# Patient Record
Sex: Male | Born: 1945
Health system: Southern US, Community
[De-identification: ages and names within clinical notes are randomized; demographics above are authoritative.]

## PROBLEM LIST (undated history)

## (undated) DIAGNOSIS — K649 Unspecified hemorrhoids: Secondary | ICD-10-CM

## (undated) DIAGNOSIS — E119 Type 2 diabetes mellitus without complications: Secondary | ICD-10-CM

## (undated) DIAGNOSIS — R413 Other amnesia: Secondary | ICD-10-CM

## (undated) DIAGNOSIS — S42401A Unspecified fracture of lower end of right humerus, initial encounter for closed fracture: Secondary | ICD-10-CM

## (undated) DIAGNOSIS — I10 Essential (primary) hypertension: Secondary | ICD-10-CM

## (undated) DIAGNOSIS — I779 Disorder of arteries and arterioles, unspecified: Secondary | ICD-10-CM

## (undated) DIAGNOSIS — Z86010 Personal history of colonic polyps: Secondary | ICD-10-CM

## (undated) DIAGNOSIS — E785 Hyperlipidemia, unspecified: Secondary | ICD-10-CM

## (undated) DIAGNOSIS — M1990 Unspecified osteoarthritis, unspecified site: Secondary | ICD-10-CM

## (undated) DIAGNOSIS — I739 Peripheral vascular disease, unspecified: Secondary | ICD-10-CM

## (undated) DIAGNOSIS — K5790 Diverticulosis of intestine, part unspecified, without perforation or abscess without bleeding: Secondary | ICD-10-CM

## (undated) HISTORY — DX: Hyperlipidemia, unspecified: E78.5

## (undated) HISTORY — DX: Peripheral vascular disease, unspecified: I73.9

## (undated) HISTORY — DX: Other amnesia: R41.3

## (undated) HISTORY — DX: Disorder of arteries and arterioles, unspecified: I77.9

## (undated) HISTORY — DX: Personal history of colonic polyps: Z86.010

## (undated) HISTORY — DX: Type 2 diabetes mellitus without complications: E11.9

## (undated) HISTORY — DX: Essential (primary) hypertension: I10

## (undated) HISTORY — DX: Unspecified fracture of lower end of right humerus, initial encounter for closed fracture: S42.401A

---

## 2016-08-11 ENCOUNTER — Encounter: Payer: Self-pay | Admitting: Sports Medicine

## 2016-08-11 ENCOUNTER — Ambulatory Visit (INDEPENDENT_AMBULATORY_CARE_PROVIDER_SITE_OTHER): Payer: Medicare HMO | Admitting: Sports Medicine

## 2016-08-11 ENCOUNTER — Ambulatory Visit: Payer: Self-pay

## 2016-08-11 ENCOUNTER — Ambulatory Visit (INDEPENDENT_AMBULATORY_CARE_PROVIDER_SITE_OTHER): Payer: Medicare HMO

## 2016-08-11 ENCOUNTER — Encounter: Payer: Self-pay | Admitting: Physician Assistant

## 2016-08-11 ENCOUNTER — Ambulatory Visit (INDEPENDENT_AMBULATORY_CARE_PROVIDER_SITE_OTHER): Payer: Medicare HMO | Admitting: Physician Assistant

## 2016-08-11 VITALS — BP 190/110 | HR 73 | Ht 71.0 in | Wt 200.0 lb

## 2016-08-11 VITALS — BP 140/83 | HR 83 | Temp 98.0°F | Resp 16 | Ht 71.0 in | Wt 200.0 lb

## 2016-08-11 DIAGNOSIS — M7021 Olecranon bursitis, right elbow: Secondary | ICD-10-CM

## 2016-08-11 DIAGNOSIS — R2231 Localized swelling, mass and lump, right upper limb: Secondary | ICD-10-CM | POA: Diagnosis not present

## 2016-08-11 DIAGNOSIS — I10 Essential (primary) hypertension: Secondary | ICD-10-CM | POA: Diagnosis not present

## 2016-08-11 DIAGNOSIS — M25421 Effusion, right elbow: Secondary | ICD-10-CM | POA: Diagnosis not present

## 2016-08-11 DIAGNOSIS — M7989 Other specified soft tissue disorders: Secondary | ICD-10-CM | POA: Diagnosis not present

## 2016-08-11 DIAGNOSIS — Z720 Tobacco use: Secondary | ICD-10-CM | POA: Diagnosis not present

## 2016-08-11 DIAGNOSIS — M25422 Effusion, left elbow: Secondary | ICD-10-CM

## 2016-08-11 MED ORDER — NICOTINE POLACRILEX 2 MG MT LOZG: 2.0000 mg | LOZENGE | OROMUCOSAL | 0 refills | Status: DC | PRN

## 2016-08-11 NOTE — Progress Notes (Signed)
Patient presents to clinic today to establish care.  Acute Concerns: Swelling of R elbow x 1 year that lasted several months, before resolving. Noted recurrence of swelling. Denies pain to the area but noted erythema and warmth. Notes improvement in redness and warmth but swelling is still present. Has a history of R elbow fracture x 30 years prior but denies any recent trauma or injury. Denies fever, chills, malaise/fatigue.    Chronic Issues: Tobacco Use Disorder -- 50 pack-year smoking history. Has previously successfully quit with nicotine lozenges. Is ready to attempt again. Denies history of COPD or asthma. Denies chronic cough, hemoptysis, wheezing or SOB.  Hypertension -- Patient notes receiving this diagnosis many years prior. Denies treatment with medication. Endorses poor diet and lack of exercise. Patient denies chest pain, palpitations, lightheadedness, dizziness, vision changes or frequent headaches.  Health Maintenance: Immunizations -- Unsure of last tetanus. Has never had pneumonia vaccination.  Colonoscopy -- Has never had. Denies melena, hematochezia, tenesmus, change to bowels. Denies family history of colon cancer screening.   Past Medical History:  Diagnosis Date  . Closed fracture dislocation of right elbow    30 years ago -- fell out of theback of a truck  . Hypertension    Diagnosed 10 years ago. No on medication. Denies history of CAD.     Past Surgical History:  Procedure Laterality Date  . NO PAST SURGERIES      No current outpatient prescriptions on file prior to visit.   No current facility-administered medications on file prior to visit.    No Known Allergies  Family History  Problem Relation Age of Onset  . Hypertension Mother   . Dementia Mother        2s  . Transient ischemic attack Mother        59s  . Kidney disease Father   . COPD Father   . Hypertension Brother   . Alcohol abuse Maternal Grandfather   . Mental illness Paternal  Grandmother   . Hypertension Daughter   . Healthy Son   . Learning disabilities Son   . Lung cancer Maternal Aunt     Social History   Social History  . Marital status: Married    Spouse name: N/A  . Number of children: 3  . Years of education: N/A   Occupational History  . Retired     Dealer   Social History Main Topics  . Smoking status: Current Every Day Smoker    Packs/day: 1.00    Years: 50.00    Types: Cigarettes    Start date: 08/12/1966  . Smokeless tobacco: Never Used  . Alcohol use No  . Drug use: No  . Sexual activity: Yes   Other Topics Concern  . Not on file   Social History Narrative  . No narrative on file   Review of Systems  Constitutional: Negative for fever and weight loss.  HENT: Negative for ear discharge, ear pain, hearing loss and tinnitus.   Eyes: Negative for blurred vision, double vision, photophobia and pain.  Respiratory: Negative for cough and shortness of breath.   Cardiovascular: Negative for chest pain and palpitations.  Gastrointestinal: Negative for abdominal pain, blood in stool, constipation, diarrhea, heartburn, melena, nausea and vomiting.  Genitourinary: Negative for dysuria, flank pain, frequency, hematuria and urgency.  Musculoskeletal: Negative for falls.       R elbow swelling  Neurological: Negative for dizziness, loss of consciousness and headaches.  Endo/Heme/Allergies: Negative for environmental allergies.  Psychiatric/Behavioral: Negative for depression, hallucinations, substance abuse and suicidal ideas. The patient is not nervous/anxious and does not have insomnia.    BP 140/83   Pulse 83   Temp 98 F (36.7 C) (Oral)   Resp 16   Ht 5\' 11"  (1.803 m)   Wt 200 lb (90.7 kg)   BMI 27.89 kg/m   Physical Exam  Constitutional: He is oriented to person, place, and time and well-developed, well-nourished, and in no distress.  HENT:  Head: Normocephalic and atraumatic.  Right Ear: External ear normal.  Left Ear:  External ear normal.  Nose: Nose normal.  Mouth/Throat: Oropharynx is clear and moist. No oropharyngeal exudate.  TM within normal limits bilaterally.  Eyes: Conjunctivae are normal.  Neck: Neck supple.  Cardiovascular: Normal rate, regular rhythm, normal heart sounds and intact distal pulses.   Pulmonary/Chest: Effort normal and breath sounds normal. No respiratory distress. He has no wheezes. He has no rales. He exhibits no tenderness.  Musculoskeletal:       Right elbow: He exhibits swelling. He exhibits normal range of motion, no effusion, no deformity and no laceration. No tenderness found.  Neurological: He is alert and oriented to person, place, and time.  Skin: Skin is warm and dry. No rash noted.  Psychiatric: Affect normal.  Vitals reviewed.  Assessment/Plan: Tobacco abuse disorder Patient is ready to quit. Will start nicotine lozenges. Close follow-up scheduled for CPE. Will discuss lung cancer screening at that visit.   Olecranon bursitis of right elbow Does not seem infectious. Present for 2 weeks and non-improving despite supportive measures. ROM within normal limits. No pain at present. Referral to Sports medicine placed for aspiration versus expectant management. Appt scheduled for this afternoon.  Essential hypertension Start DASH diet. Will work on smoking cessation. CPE scheduled for 2 weeks. Will repeat BP at that time. If still elevated will start medication with continued TLC.     Leeanne Rio, PA-C

## 2016-08-11 NOTE — Assessment & Plan Note (Signed)
Start DASH diet. Will work on smoking cessation. CPE scheduled for 2 weeks. Will repeat BP at that time. If still elevated will start medication with continued TLC.

## 2016-08-11 NOTE — Patient Instructions (Addendum)
You will be contacted for assessment and management by Dr. Paulla Fore. Please call me if you do not hear from his office in the next couple of days for an appointment. Keep the area iced and elevated.   Please follow the diet below. Start the OTC nicotine losenges to help with smoking cessation. Follow-up with me in 2 weeks for a complete physical. We will reassess blood pressure and update all health maintenance topics.   DASH Eating Plan DASH stands for "Dietary Approaches to Stop Hypertension." The DASH eating plan is a healthy eating plan that has been shown to reduce high blood pressure (hypertension). It may also reduce your risk for type 2 diabetes, heart disease, and stroke. The DASH eating plan may also help with weight loss. What are tips for following this plan? General guidelines  Avoid eating more than 2,300 mg (milligrams) of salt (sodium) a day. If you have hypertension, you may need to reduce your sodium intake to 1,500 mg a day.  Limit alcohol intake to no more than 1 drink a day for nonpregnant women and 2 drinks a day for men. One drink equals 12 oz of beer, 5 oz of wine, or 1 oz of hard liquor.  Work with your health care provider to maintain a healthy body weight or to lose weight. Ask what an ideal weight is for you.  Get at least 30 minutes of exercise that causes your heart to beat faster (aerobic exercise) most days of the week. Activities may include walking, swimming, or biking.  Work with your health care provider or diet and nutrition specialist (dietitian) to adjust your eating plan to your individual calorie needs. Reading food labels  Check food labels for the amount of sodium per serving. Choose foods with less than 5 percent of the Daily Value of sodium. Generally, foods with less than 300 mg of sodium per serving fit into this eating plan.  To find whole grains, look for the word "whole" as the first word in the ingredient list. Shopping  Buy products  labeled as "low-sodium" or "no salt added."  Buy fresh foods. Avoid canned foods and premade or frozen meals. Cooking  Avoid adding salt when cooking. Use salt-free seasonings or herbs instead of table salt or sea salt. Check with your health care provider or pharmacist before using salt substitutes.  Do not fry foods. Cook foods using healthy methods such as baking, boiling, grilling, and broiling instead.  Cook with heart-healthy oils, such as olive, canola, soybean, or sunflower oil. Meal planning   Eat a balanced diet that includes: ? 5 or more servings of fruits and vegetables each day. At each meal, try to fill half of your plate with fruits and vegetables. ? Up to 6-8 servings of whole grains each day. ? Less than 6 oz of lean meat, poultry, or fish each day. A 3-oz serving of meat is about the same size as a deck of cards. One egg equals 1 oz. ? 2 servings of low-fat dairy each day. ? A serving of nuts, seeds, or beans 5 times each week. ? Heart-healthy fats. Healthy fats called Omega-3 fatty acids are found in foods such as flaxseeds and coldwater fish, like sardines, salmon, and mackerel.  Limit how much you eat of the following: ? Canned or prepackaged foods. ? Food that is high in trans fat, such as fried foods. ? Food that is high in saturated fat, such as fatty meat. ? Sweets, desserts, sugary drinks, and other  foods with added sugar. ? Full-fat dairy products.  Do not salt foods before eating.  Try to eat at least 2 vegetarian meals each week.  Eat more home-cooked food and less restaurant, buffet, and fast food.  When eating at a restaurant, ask that your food be prepared with less salt or no salt, if possible. What foods are recommended? The items listed may not be a complete list. Talk with your dietitian about what dietary choices are best for you. Grains Whole-grain or whole-wheat bread. Whole-grain or whole-wheat pasta. Brown rice. Modena Morrow. Bulgur.  Whole-grain and low-sodium cereals. Pita bread. Low-fat, low-sodium crackers. Whole-wheat flour tortillas. Vegetables Fresh or frozen vegetables (raw, steamed, roasted, or grilled). Low-sodium or reduced-sodium tomato and vegetable juice. Low-sodium or reduced-sodium tomato sauce and tomato paste. Low-sodium or reduced-sodium canned vegetables. Fruits All fresh, dried, or frozen fruit. Canned fruit in natural juice (without added sugar). Meat and other protein foods Skinless chicken or [place]. Ground chicken or [place]. Pork with fat trimmed off. Fish and seafood. Egg whites. Dried beans, peas, or lentils. Unsalted nuts, nut butters, and seeds. Unsalted canned beans. Lean cuts of beef with fat trimmed off. Low-sodium, lean deli meat. Dairy Low-fat (1%) or fat-free (skim) milk. Fat-free, low-fat, or reduced-fat cheeses. Nonfat, low-sodium ricotta or cottage cheese. Low-fat or nonfat yogurt. Low-fat, low-sodium cheese. Fats and oils Soft margarine without trans fats. Vegetable oil. Low-fat, reduced-fat, or light mayonnaise and salad dressings (reduced-sodium). Canola, safflower, olive, soybean, and sunflower oils. Avocado. Seasoning and other foods Herbs. Spices. Seasoning mixes without salt. Unsalted popcorn and pretzels. Fat-free sweets. What foods are not recommended? The items listed may not be a complete list. Talk with your dietitian about what dietary choices are best for you. Grains Baked goods made with fat, such as croissants, muffins, or some breads. Dry pasta or rice meal packs. Vegetables Creamed or fried vegetables. Vegetables in a cheese sauce. Regular canned vegetables (not low-sodium or reduced-sodium). Regular canned tomato sauce and paste (not low-sodium or reduced-sodium). Regular tomato and vegetable juice (not low-sodium or reduced-sodium). Angie Fava. Olives. Fruits Canned fruit in a light or heavy syrup. Fried fruit. Fruit in cream or butter sauce. Meat and other protein  foods Fatty cuts of meat. Ribs. Fried meat. Berniece Salines. Sausage. Bologna and other processed lunch meats. Salami. Fatback. Hotdogs. Bratwurst. Salted nuts and seeds. Canned beans with added salt. Canned or smoked fish. Whole eggs or egg yolks. Chicken or [place] with skin. Dairy Whole or 2% milk, cream, and half-and-half. Whole or full-fat cream cheese. Whole-fat or sweetened yogurt. Full-fat cheese. Nondairy creamers. Whipped toppings. Processed cheese and cheese spreads. Fats and oils Butter. Stick margarine. Lard. Shortening. Ghee. Bacon fat. Tropical oils, such as coconut, palm kernel, or palm oil. Seasoning and other foods Salted popcorn and pretzels. Onion salt, garlic salt, seasoned salt, table salt, and sea salt. Worcestershire sauce. Tartar sauce. Barbecue sauce. Teriyaki sauce. Soy sauce, including reduced-sodium. Steak sauce. Canned and packaged gravies. Fish sauce. Oyster sauce. Cocktail sauce. Horseradish that you find on the shelf. Ketchup. Mustard. Meat flavorings and tenderizers. Bouillon cubes. Hot sauce and Tabasco sauce. Premade or packaged marinades. Premade or packaged taco seasonings. Relishes. Regular salad dressings. Where to find more information:  National Heart, Lung, and [place]: https://wilson-eaton.com/  American Heart Association: www.heart.org Summary  The DASH eating plan is a healthy eating plan that has been shown to reduce high blood pressure (hypertension). It may also reduce your risk for type 2 diabetes, heart disease, and stroke.  With  the DASH eating plan, you should limit salt (sodium) intake to 2,300 mg a day. If you have hypertension, you may need to reduce your sodium intake to 1,500 mg a day.  When on the DASH eating plan, aim to eat more fresh fruits and vegetables, whole grains, lean proteins, low-fat dairy, and heart-healthy fats.  Work with your health care provider or diet and nutrition specialist (dietitian) to adjust your eating plan to your  individual calorie needs. This information is not intended to replace advice given to you by your health care provider. Make sure you discuss any questions you have with your health care provider. Document Released: 01/01/2011 Document Revised: 01/06/2016 Document Reviewed: 01/06/2016 Elsevier Interactive Patient Education  2017 [employer].

## 2016-08-11 NOTE — Progress Notes (Signed)
OFFICE VISIT NOTE John Woodward. John Woodward Sports Medicine Physician  [facility] : [facility] [phone]  John Woodward. - 71 y.o. male MRN [id number]  Date of birth: 08/23/1945  Visit Date: 08/11/2016  PCP: John Jeans, PA-C   Referred by: John Jeans, PA-C  John Woodward acting as scribe for Dr. Paulla Fore.  SUBJECTIVE:   Chief Complaint  Patient presents with  . New Patient (Initial Visit)  . Right Elbow Pain   HPI: As below and per problem based documentation when appropriate.   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 Similar sx x1 year ago but after several months sx resolved itself. There is minimal redness and warmth. History of R elbow fracture x 30 years prior but denies any recent trauma or injury. This episode is worse than before. Denies any joint pain but skin is tender to touch.    Review of Systems  Constitutional: Negative for chills, diaphoresis, fever, malaise/fatigue and weight loss.  HENT: Negative.   Eyes: Negative.   Respiratory: Negative.   Cardiovascular: Negative.   Gastrointestinal: Negative.   Musculoskeletal: Negative for back pain, falls, joint pain, myalgias and neck pain.  Skin: Positive for rash. Negative for itching.  Neurological: Negative.  Negative for weakness.  Endo/Heme/Allergies: Negative for environmental allergies and polydipsia. Does not bruise/bleed easily.  Psychiatric/Behavioral: Negative.     Otherwise per HPI.  HISTORY & PERTINENT PRIOR DATA:  No specialty comments available. He reports that he has been smoking Cigarettes.  He started smoking about 50 years ago. He has a 50.00 pack-year smoking history. He has never used smokeless tobacco.   Medications & Allergies reviewed per EMR Patient Active Problem List   Diagnosis Date Noted  . Encounter for Medicare annual wellness exam 09/16/2016  . Special screening examination for viral  disease 09/16/2016  . Annual physical exam 09/16/2016  . Prostate cancer screening 09/16/2016  . Olecranon bursitis of right elbow 08/11/2016  . Tobacco abuse disorder 08/11/2016  . Essential hypertension 08/11/2016   Past Medical History:  Diagnosis Date  . Arthritis   . Closed fracture dislocation of right elbow    30 years ago -- fell out of theback of a truck  . Hypertension    Diagnosed 10 years ago. No on medication. Denies history of CAD.    Family History  Problem Relation Age of Onset  . Hypertension Mother   . Dementia Mother        108s  . Transient ischemic attack Mother        31s  . Kidney disease Father   . COPD Father   . Hypertension Brother   . Alcohol abuse Maternal Grandfather   . Mental illness Paternal Grandmother   . Hypertension Daughter   . Healthy Son   . Learning disabilities Son   . Lung cancer Maternal Aunt    Past Surgical History:  Procedure Laterality Date  . NO PAST SURGERIES    . OLECRANON BURSECTOMY Right 08/18/2016   Procedure: OLECRANON BURSECTOMY;  Surgeon: Meredith Pel, MD;  Location: [place];  Service: Orthopedics;  Laterality: Right;   Social History   Occupational History  . Retired     Dealer   Social History Main Topics  . Smoking status: Current Every Day Smoker    Packs/day: 1.00    Years: 50.00    Types: Cigarettes    Start date:  08/12/1966  . Smokeless tobacco: Never Used  . Alcohol use No  . Drug use: No  . Sexual activity: Yes    OBJECTIVE:  VS:  HT:5\' 11"  (180.3 cm)   WT:200 lb (90.7 kg)  BMI:28    BP:(!) 190/110  HR:73bpm  TEMP: ( )  RESP:98 % EXAM: Findings:  WDWN, NAD, Non-toxic appearing Alert & appropriately interactive Not depressed or anxious appearing No increased work of breathing. Pupils are equal. EOM intact without nystagmus No clubbing or cyanosis of the extremities appreciated No significant rashes/lesions/ulcerations overlying the examined area. Radial pulses 2+/4.  No  significant generalized UE edema. Sensation intact to light touch in upper extremities.  Right elbow:  Well aligned however there is a very large cystic structure along the posterior elbow in the area of the olecranon bursa.  This is solid.  There is some denuded skin however no significant streaking erythema and only mild discomfort.  There is no fluctuance appreciated mass is quite hard.  His elbow has limited flexion and extension.  Biceps tendon is intact and nonpainful.  Elbow flexion and extension strength is normal.     ASSESSMENT & PLAN:     ICD-10-CM   1. Elbow swelling, right M25.421 MR ELBOW RIGHT W WO CONTRAST  2. Olecranon bursitis of right elbow M70.21 [place] LIMITED JOINT SPACE STRUCTURES UP LEFT(NO LINKED CHARGES)    DG ELBOW COMPLETE RIGHT (3+VIEW)  3. Elbow mass, right R22.31 MR ELBOW RIGHT W WO CONTRAST  4. Tobacco abuse disorder Z72.0   5. Essential hypertension I10   ================================================================= Olecranon bursitis of right elbow Presentation is consistent with a recurrent olecranon bursitis.  Given the findings however on MSK ultrasound revealed a solid structure with significant vascular structures within this past further evaluation with MRI with and without contrast is warranted.  Likely referral to orthopedics.  We will plan to touch base with him by telephone to discuss the results of the MRI  Tobacco abuse disorder Abuse counseling provided today  Essential hypertension Markedly elevated today.  He is in acute discomfort but will need close follow-up and establish with PCP.  Patient lab work obtained today. ================================================================= Follow-up: Return for Also, we will call you about your results.   [facility] served as Education administrator during this visit. History, Physical, and Plan performed by medical provider. Documentation and orders reviewed and attested to.      Teresa Coombs, [place]  Sports Medicine Physician

## 2016-08-11 NOTE — Assessment & Plan Note (Signed)
Patient is ready to quit. Will start nicotine lozenges. Close follow-up scheduled for CPE. Will discuss lung cancer screening at that visit.

## 2016-08-11 NOTE — Assessment & Plan Note (Signed)
Does not seem infectious. Present for 2 weeks and non-improving despite supportive measures. ROM within normal limits. No pain at present. Referral to Sports medicine placed for aspiration versus expectant management. Appt scheduled for this afternoon.

## 2016-08-12 ENCOUNTER — Ambulatory Visit (HOSPITAL_COMMUNITY)
Admission: RE | Admit: 2016-08-12 | Discharge: 2016-08-12 | Disposition: A | Discharge status: Home / Self Care | Payer: Medicare HMO | Source: Ambulatory Visit | Attending: Sports Medicine | Admitting: Sports Medicine

## 2016-08-12 ENCOUNTER — Other Ambulatory Visit (HOSPITAL_COMMUNITY): Payer: Medicare HMO

## 2016-08-12 DIAGNOSIS — R600 Localized edema: Secondary | ICD-10-CM | POA: Diagnosis not present

## 2016-08-12 DIAGNOSIS — M25421 Effusion, right elbow: Secondary | ICD-10-CM | POA: Diagnosis present

## 2016-08-12 DIAGNOSIS — M19021 Primary osteoarthritis, right elbow: Secondary | ICD-10-CM | POA: Diagnosis not present

## 2016-08-12 LAB — POCT I-STAT CREATININE: Creatinine, Ser: 1.3 mg/dL — ABNORMAL HIGH (ref 0.61–1.24)

## 2016-08-12 MED ORDER — GADOBENATE DIMEGLUMINE 529 MG/ML IV SOLN
20.0000 mL | Freq: Once | INTRAVENOUS | Status: AC | PRN
  Administered 2016-08-12: 18 mL via INTRAVENOUS

## 2016-08-13 ENCOUNTER — Telehealth: Payer: Self-pay | Admitting: Sports Medicine

## 2016-08-13 DIAGNOSIS — M7021 Olecranon bursitis, right elbow: Secondary | ICD-10-CM

## 2016-08-13 NOTE — Telephone Encounter (Signed)
Called and spoke with [place] (pt's wife, pt currently napping) regarding findings.  I also spoke with Dr. Alphonzo Severance at [facility].  No systemic signs and given findings on MRI/US elective surgical excision is indicated. Will plan for office consultation on Monday and referal has been place with likely surgery on Tuesday.  Reviewed red flags with [place] including fevers, chills, n/v and instructed to go to ED if these occur.  Will plan to defer further care to Dr. Marlou Sa

## 2016-08-17 ENCOUNTER — Encounter (HOSPITAL_COMMUNITY): Payer: Self-pay | Admitting: *Deleted

## 2016-08-17 ENCOUNTER — Encounter (INDEPENDENT_AMBULATORY_CARE_PROVIDER_SITE_OTHER): Payer: Self-pay | Admitting: Orthopedic Surgery

## 2016-08-17 ENCOUNTER — Other Ambulatory Visit (HOSPITAL_COMMUNITY): Payer: Self-pay | Admitting: *Deleted

## 2016-08-17 ENCOUNTER — Ambulatory Visit (INDEPENDENT_AMBULATORY_CARE_PROVIDER_SITE_OTHER): Payer: Medicare HMO | Admitting: Orthopedic Surgery

## 2016-08-17 ENCOUNTER — Other Ambulatory Visit (INDEPENDENT_AMBULATORY_CARE_PROVIDER_SITE_OTHER): Payer: Self-pay | Admitting: Orthopedic Surgery

## 2016-08-17 DIAGNOSIS — M7021 Olecranon bursitis, right elbow: Secondary | ICD-10-CM

## 2016-08-17 NOTE — Anesthesia Preprocedure Evaluation (Addendum)
Anesthesia Evaluation  Patient identified by MRN, date of birth, ID band Patient awake    Reviewed: Allergy & Precautions, NPO status , Patient's Chart, lab work & pertinent test results  History of Anesthesia Complications Negative for: history of anesthetic complications  Airway Mallampati: III  TM Distance: >3 FB Neck ROM: Full    Dental  (+) Edentulous Upper, Partial Lower, Dental Advisory Given   Pulmonary COPD, Current Smoker,    breath sounds clear to auscultation       Cardiovascular hypertension (has never required medicines), (-) angina Rhythm:Regular Rate:Normal     Neuro/Psych negative neurological ROS     GI/Hepatic negative GI ROS, Neg liver ROS,   Endo/Other  negative endocrine ROS  Renal/GU negative Renal ROS     Musculoskeletal negative musculoskeletal ROS (+)   Abdominal   Peds  Hematology negative hematology ROS (+)   Anesthesia Other Findings   Reproductive/Obstetrics                            Anesthesia Physical Anesthesia Plan  ASA: II  Anesthesia Plan: General   Post-op Pain Management:    Induction: Intravenous  PONV Risk Score and Plan: 2 and Ondansetron and Dexamethasone  Airway Management Planned: LMA  Additional Equipment:   Intra-op Plan:   Post-operative Plan: Extubation in OR  Informed Consent: I have reviewed the patients History and Physical, chart, labs and discussed the procedure including the risks, benefits and alternatives for the proposed anesthesia with the patient or authorized representative who has indicated his/her understanding and acceptance.   Dental advisory given  Plan Discussed with: CRNA and Surgeon  Anesthesia Plan Comments: (Plan routine monitors, GA- LMA OK)        Anesthesia Quick Evaluation

## 2016-08-17 NOTE — Progress Notes (Signed)
Office Visit Note   Patient: John Woodward.           Date of Birth: July 13, 1945           MRN: [id number] Visit Date: 08/17/2016 Requested by: Brunetta Jeans, PA-C [address] [place], [place] [address] PCP: Delorse Limber  Subjective: Chief Complaint  Patient presents with  . Right Elbow - Pain    HPI: John Woodward is a 71 year old patient with right elbow mass.  Denies any history of injury.  He had some pain swelling and redness for 3 weeks.  A year and a half ago the same thing happened but it improved.  It is not improving over the past month.  It is hard for him to sleep on that elbow.  He states that the mass on the olecranon tip interferes with his activities of daily living.  He denies any recent fevers or chills.  MRI scan is reviewed and is consistent with a very thick rind of bursal type tissue surrounding a small fluid pocket around the olecranon tip.  Mass is about the size of golf ball.              ROS: All systems reviewed are negative as they relate to the chief complaint within the history of present illness.  Patient denies  fevers or chills.   Assessment & Plan: Visit Diagnoses:  1. Olecranon bursitis, right elbow     Plan: Impression is significant right elbow olecranon bursitis with large thick rind of reactive tissue surrounding a fluid pocket.  This may represent a pulse wall off infection within the olecranon bursa itself.  Patient does not have a history of gout and so gouty tophus is possible but less likely.  Because of the mass and discomfort and possibility of occult infection excision of the olecranon bursa is planned.  Risks and benefits are discussed including but limited to infection or vessel damage incomplete healing as well as potential for accumulation of fluid and skin problems.  Patient understands the risks and benefits.  All questions answered.  Do anticipate sending the specimen for pathologic analysis  Follow-Up Instructions:  No Follow-up on file.   Orders:  No orders of the defined types were placed in this encounter.  No orders of the defined types were placed in this encounter.     Procedures: No procedures performed   Clinical Data: No additional findings.  Objective: Vital Signs: There were no vitals taken for this visit.  Physical Exam:   Constitutional: Patient appears well-developed HEENT:  Head: Normocephalic Eyes:EOM are normal Neck: Normal range of motion Cardiovascular: Normal rate Pulmonary/chest: Effort normal Neurologic: Patient is alert Skin: Skin is warm Psychiatric: Patient has normal mood and affect    Ortho Exam: Orthopedic exam demonstrates good motor sensory function to the right hand.  I'll pulse size olecranon bursa which has some erythema is present.  No proximal lymphadenopathy on the right-hand side.  This mass is somewhat mobile but not fluctuant.  It is hard and rind-like.  Elbow motion is not affected.  Specialty Comments:  No specialty comments available.  Imaging: No results found.   PMFS History: Patient Active Problem List   Diagnosis Date Noted  . Olecranon bursitis of right elbow 08/11/2016  . Tobacco abuse disorder 08/11/2016  . Essential hypertension 08/11/2016   Past Medical History:  Diagnosis Date  . Arthritis   . Closed fracture dislocation of right elbow  30 years ago -- fell out of theback of a truck  . Hypertension    Diagnosed 10 years ago. No on medication. Denies history of CAD.     Family History  Problem Relation Age of Onset  . Hypertension Mother   . Dementia Mother        62s  . Transient ischemic attack Mother        7s  . Kidney disease Father   . COPD Father   . Hypertension Brother   . Alcohol abuse Maternal Grandfather   . Mental illness Paternal Grandmother   . Hypertension Daughter   . Healthy Son   . Learning disabilities Son   . Lung cancer Maternal Aunt     Past Surgical History:  Procedure  Laterality Date  . NO PAST SURGERIES     Social History   Occupational History  . Retired     Dealer   Social History Main Topics  . Smoking status: Current Every Day Smoker    Packs/day: 1.00    Years: 50.00    Types: Cigarettes    Start date: 08/12/1966  . Smokeless tobacco: Never Used  . Alcohol use No  . Drug use: No  . Sexual activity: Yes

## 2016-08-17 NOTE — Progress Notes (Signed)
Spoke with pt's wife John Woodward for pre-op call. Pt has hx of Hypertension, but is not on any medications. Mrs. John Woodward denies any cardiac history for pt. Pt's PCP is Elyn Aquas, [place] in [place].

## 2016-08-18 ENCOUNTER — Ambulatory Visit (HOSPITAL_COMMUNITY): Payer: Medicare HMO | Admitting: Anesthesiology

## 2016-08-18 ENCOUNTER — Encounter (HOSPITAL_COMMUNITY): Payer: Self-pay | Admitting: Anesthesiology

## 2016-08-18 ENCOUNTER — Ambulatory Visit (HOSPITAL_COMMUNITY)
Admission: RE | Admit: 2016-08-18 | Discharge: 2016-08-18 | Disposition: A | Discharge status: Home / Self Care | Payer: Medicare HMO | Source: Ambulatory Visit | Attending: Orthopedic Surgery | Admitting: Orthopedic Surgery

## 2016-08-18 ENCOUNTER — Encounter (HOSPITAL_COMMUNITY)
Admission: RE | Disposition: A | Discharge status: Home / Self Care | Payer: Self-pay | Source: Ambulatory Visit | Attending: Orthopedic Surgery

## 2016-08-18 DIAGNOSIS — Z811 Family history of alcohol abuse and dependence: Secondary | ICD-10-CM | POA: Diagnosis not present

## 2016-08-18 DIAGNOSIS — I10 Essential (primary) hypertension: Secondary | ICD-10-CM | POA: Insufficient documentation

## 2016-08-18 DIAGNOSIS — Z836 Family history of other diseases of the respiratory system: Secondary | ICD-10-CM | POA: Insufficient documentation

## 2016-08-18 DIAGNOSIS — Z883 Allergy status to other anti-infective agents status: Secondary | ICD-10-CM | POA: Diagnosis not present

## 2016-08-18 DIAGNOSIS — J449 Chronic obstructive pulmonary disease, unspecified: Secondary | ICD-10-CM | POA: Diagnosis not present

## 2016-08-18 DIAGNOSIS — M1990 Unspecified osteoarthritis, unspecified site: Secondary | ICD-10-CM | POA: Diagnosis not present

## 2016-08-18 DIAGNOSIS — Z801 Family history of malignant neoplasm of trachea, bronchus and lung: Secondary | ICD-10-CM | POA: Diagnosis not present

## 2016-08-18 DIAGNOSIS — Z818 Family history of other mental and behavioral disorders: Secondary | ICD-10-CM | POA: Insufficient documentation

## 2016-08-18 DIAGNOSIS — M7021 Olecranon bursitis, right elbow: Secondary | ICD-10-CM | POA: Diagnosis not present

## 2016-08-18 DIAGNOSIS — A4901 Methicillin susceptible Staphylococcus aureus infection, unspecified site: Secondary | ICD-10-CM | POA: Insufficient documentation

## 2016-08-18 DIAGNOSIS — F17210 Nicotine dependence, cigarettes, uncomplicated: Secondary | ICD-10-CM | POA: Insufficient documentation

## 2016-08-18 DIAGNOSIS — Z810 Family history of intellectual disabilities: Secondary | ICD-10-CM | POA: Diagnosis not present

## 2016-08-18 DIAGNOSIS — Z8249 Family history of ischemic heart disease and other diseases of the circulatory system: Secondary | ICD-10-CM | POA: Diagnosis not present

## 2016-08-18 DIAGNOSIS — R69 Illness, unspecified: Secondary | ICD-10-CM | POA: Diagnosis not present

## 2016-08-18 DIAGNOSIS — Z841 Family history of disorders of kidney and ureter: Secondary | ICD-10-CM | POA: Diagnosis not present

## 2016-08-18 HISTORY — DX: Unspecified osteoarthritis, unspecified site: M19.90

## 2016-08-18 HISTORY — PX: OLECRANON BURSECTOMY: SHX2097

## 2016-08-18 LAB — CBC
HEMATOCRIT: 42.8 % (ref 39.0–52.0)
HEMOGLOBIN: 14.7 g/dL (ref 13.0–17.0)
MCH: 30.9 pg (ref 26.0–34.0)
MCHC: 34.3 g/dL (ref 30.0–36.0)
MCV: 90.1 fL (ref 78.0–100.0)
Platelets: 252 10*3/uL (ref 150–400)
RBC: 4.75 MIL/uL (ref 4.22–5.81)
RDW: 12.8 % (ref 11.5–15.5)
WBC: 6.1 10*3/uL (ref 4.0–10.5)

## 2016-08-18 LAB — BASIC METABOLIC PANEL
Anion gap: 9 (ref 5–15)
BUN: 17 mg/dL (ref 6–20)
CHLORIDE: 103 mmol/L (ref 101–111)
CO2: 23 mmol/L (ref 22–32)
Calcium: 8.9 mg/dL (ref 8.9–10.3)
Creatinine, Ser: 1.06 mg/dL (ref 0.61–1.24)
GFR calc non Af Amer: >60 mL/min (ref >60)
Glucose, Bld: 243 mg/dL — ABNORMAL HIGH (ref 65–99)
POTASSIUM: 3.8 mmol/L (ref 3.5–5.1)
SODIUM: 135 mmol/L (ref 135–145)

## 2016-08-18 SURGERY — BURSECTOMY, ELBOW
Anesthesia: General | Site: Elbow | Laterality: Right

## 2016-08-18 MED ORDER — VANCOMYCIN HCL 1000 MG IV SOLR
INTRAVENOUS | Status: DC | PRN
  Administered 2016-08-18: 1000 mg

## 2016-08-18 MED ORDER — BUPIVACAINE HCL (PF) 0.5 % IJ SOLN
INTRAMUSCULAR | Status: AC
  Filled 2016-08-18: qty 30

## 2016-08-18 MED ORDER — MEPERIDINE HCL 25 MG/ML IJ SOLN: 6.2500 mg | INTRAMUSCULAR | Status: DC | PRN

## 2016-08-18 MED ORDER — PROPOFOL 10 MG/ML IV BOLUS
INTRAVENOUS | Status: AC
  Filled 2016-08-18: qty 20

## 2016-08-18 MED ORDER — BUPIVACAINE HCL (PF) 0.5 % IJ SOLN
INTRAMUSCULAR | Status: DC | PRN
  Administered 2016-08-18: 10 mL

## 2016-08-18 MED ORDER — DEXAMETHASONE SODIUM PHOSPHATE 10 MG/ML IJ SOLN
INTRAMUSCULAR | Status: DC | PRN
  Administered 2016-08-18: 10 mg via INTRAVENOUS

## 2016-08-18 MED ORDER — LACTATED RINGERS IV SOLN
INTRAVENOUS | Status: DC | PRN
  Administered 2016-08-18: 07:00:00 via INTRAVENOUS

## 2016-08-18 MED ORDER — LIDOCAINE HCL (CARDIAC) 20 MG/ML IV SOLN
INTRAVENOUS | Status: AC
  Filled 2016-08-18: qty 5

## 2016-08-18 MED ORDER — CEFAZOLIN SODIUM-DEXTROSE 2-3 GM-% IV SOLR
2.0000 g | Freq: Once | INTRAVENOUS | Status: AC
  Administered 2016-08-18: 2 g via INTRAVENOUS
  Filled 2016-08-18: qty 50

## 2016-08-18 MED ORDER — ONDANSETRON HCL 4 MG/2ML IJ SOLN
INTRAMUSCULAR | Status: DC | PRN
  Administered 2016-08-18: 4 mg via INTRAVENOUS

## 2016-08-18 MED ORDER — LIDOCAINE HCL (CARDIAC) 20 MG/ML IV SOLN
INTRAVENOUS | Status: DC | PRN
  Administered 2016-08-18: 40 mg via INTRAVENOUS

## 2016-08-18 MED ORDER — VANCOMYCIN HCL 1000 MG IV SOLR
INTRAVENOUS | Status: AC
  Filled 2016-08-18: qty 1000

## 2016-08-18 MED ORDER — EPHEDRINE SULFATE 50 MG/ML IJ SOLN
INTRAMUSCULAR | Status: DC | PRN
  Administered 2016-08-18: 5 mg via INTRAVENOUS

## 2016-08-18 MED ORDER — HYDROMORPHONE HCL 1 MG/ML IJ SOLN: 0.2500 mg | INTRAMUSCULAR | Status: DC | PRN

## 2016-08-18 MED ORDER — PROPOFOL 10 MG/ML IV BOLUS
INTRAVENOUS | Status: DC | PRN
  Administered 2016-08-18: 50 mg via INTRAVENOUS
  Administered 2016-08-18: 150 mg via INTRAVENOUS

## 2016-08-18 MED ORDER — 0.9 % SODIUM CHLORIDE (POUR BTL) OPTIME
TOPICAL | Status: DC | PRN
  Administered 2016-08-18: 1000 mL

## 2016-08-18 MED ORDER — CEFAZOLIN SODIUM-DEXTROSE 2-4 GM/100ML-% IV SOLN
INTRAVENOUS | Status: AC
  Filled 2016-08-18: qty 100

## 2016-08-18 MED ORDER — DEXAMETHASONE SODIUM PHOSPHATE 10 MG/ML IJ SOLN
INTRAMUSCULAR | Status: AC
  Filled 2016-08-18: qty 1

## 2016-08-18 MED ORDER — FENTANYL CITRATE (PF) 250 MCG/5ML IJ SOLN
INTRAMUSCULAR | Status: AC
  Filled 2016-08-18: qty 5

## 2016-08-18 MED ORDER — FENTANYL CITRATE (PF) 100 MCG/2ML IJ SOLN
INTRAMUSCULAR | Status: DC | PRN
  Administered 2016-08-18 (×4): 50 ug via INTRAVENOUS

## 2016-08-18 MED ORDER — ONDANSETRON HCL 4 MG/2ML IJ SOLN
INTRAMUSCULAR | Status: AC
  Filled 2016-08-18: qty 2

## 2016-08-18 MED ORDER — PROMETHAZINE HCL 25 MG/ML IJ SOLN: 6.2500 mg | INTRAMUSCULAR | Status: DC | PRN

## 2016-08-18 MED ORDER — EPHEDRINE 5 MG/ML INJ
INTRAVENOUS | Status: AC
  Filled 2016-08-18: qty 10

## 2016-08-18 MED ORDER — MIDAZOLAM HCL 2 MG/2ML IJ SOLN: 0.5000 mg | Freq: Once | INTRAMUSCULAR | Status: DC | PRN

## 2016-08-18 MED ORDER — MIDAZOLAM HCL 5 MG/5ML IJ SOLN
INTRAMUSCULAR | Status: DC | PRN
  Administered 2016-08-18: 2 mg via INTRAVENOUS

## 2016-08-18 MED ORDER — MIDAZOLAM HCL 2 MG/2ML IJ SOLN
INTRAMUSCULAR | Status: AC
  Filled 2016-08-18: qty 2

## 2016-08-18 MED FILL — ACETAMINOPHEN/COD #3 TABLET: 300-30 | 10 days supply | Qty: 30 | Fill #0

## 2016-08-18 SURGICAL SUPPLY — 51 items
APL SKNCLS STERI-STRIP NONHPOA (GAUZE/BANDAGES/DRESSINGS) ×1
BANDAGE ACE 4X5 VEL STRL LF (GAUZE/BANDAGES/DRESSINGS) ×1 IMPLANT
BENZOIN TINCTURE PRP APPL 2/3 (GAUZE/BANDAGES/DRESSINGS) ×2 IMPLANT
BLADE CLIPPER SURG (BLADE) IMPLANT
BNDG CMPR 9X4 STRL LF SNTH (GAUZE/BANDAGES/DRESSINGS) ×1
BNDG CONFORM 3 STRL LF (GAUZE/BANDAGES/DRESSINGS) IMPLANT
BNDG ELASTIC 2X5.8 VLCR STR LF (GAUZE/BANDAGES/DRESSINGS) IMPLANT
BNDG ESMARK 4X9 LF (GAUZE/BANDAGES/DRESSINGS) ×1 IMPLANT
COVER SURGICAL LIGHT HANDLE (MISCELLANEOUS) ×2 IMPLANT
CUFF TOURNIQUET SINGLE 18IN (TOURNIQUET CUFF) ×1 IMPLANT
CUFF TOURNIQUET SINGLE 24IN (TOURNIQUET CUFF) IMPLANT
DRAPE INCISE IOBAN 66X45 STRL (DRAPES) ×1 IMPLANT
DRAPE U-SHAPE 47X51 STRL (DRAPES) ×1 IMPLANT
DRSG EMULSION OIL 3X3 NADH (GAUZE/BANDAGES/DRESSINGS) IMPLANT
DURAPREP 26ML APPLICATOR (WOUND CARE) ×2 IMPLANT
ELECT REM PT RETURN 9FT ADLT (ELECTROSURGICAL) ×2
ELECTRODE REM PT RTRN 9FT ADLT (ELECTROSURGICAL) IMPLANT
GAUZE SPONGE 4X4 12PLY STRL (GAUZE/BANDAGES/DRESSINGS) ×1 IMPLANT
GAUZE XEROFORM 1X8 LF (GAUZE/BANDAGES/DRESSINGS) ×1 IMPLANT
GLOVE BIOGEL PI IND STRL 8 (GLOVE) ×1 IMPLANT
GLOVE BIOGEL PI INDICATOR 8 (GLOVE) ×1
GLOVE SURG ORTHO 8.0 STRL STRW (GLOVE) ×2 IMPLANT
GOWN STRL REUS W/ TWL LRG LVL3 (GOWN DISPOSABLE) ×2 IMPLANT
GOWN STRL REUS W/TWL LRG LVL3 (GOWN DISPOSABLE) ×4
KIT BASIN OR (CUSTOM PROCEDURE TRAY) ×2 IMPLANT
KIT ROOM TURNOVER OR (KITS) ×2 IMPLANT
MANIFOLD NEPTUNE II (INSTRUMENTS) ×2 IMPLANT
NS IRRIG 1000ML POUR BTL (IV SOLUTION) ×2 IMPLANT
PACK ORTHO EXTREMITY (CUSTOM PROCEDURE TRAY) ×2 IMPLANT
PAD ABD 8X10 STRL (GAUZE/BANDAGES/DRESSINGS) ×1 IMPLANT
PAD ARMBOARD 7.5X6 YLW CONV (MISCELLANEOUS) ×4 IMPLANT
PENCIL BUTTON HOLSTER BLD 10FT (ELECTRODE) ×1 IMPLANT
SPECIMEN JAR SMALL (MISCELLANEOUS) ×2 IMPLANT
SPLINT PLASTER CAST XFAST 5X30 (CAST SUPPLIES) IMPLANT
SPLINT PLASTER XFAST SET 5X30 (CAST SUPPLIES) ×1
STRIP CLOSURE SKIN 1/2X4 (GAUZE/BANDAGES/DRESSINGS) ×2 IMPLANT
SUCTION FRAZIER HANDLE 10FR (MISCELLANEOUS) ×1
SUCTION TUBE FRAZIER 10FR DISP (MISCELLANEOUS) IMPLANT
SUT ETHIBOND 4 0 TF (SUTURE) IMPLANT
SUT ETHIBOND 5 0 P 3 (SUTURE)
SUT ETHILON 4 0 P 3 18 (SUTURE) IMPLANT
SUT ETHILON 5 0 P 3 18 (SUTURE)
SUT NYLON ETHILON 5-0 P-3 1X18 (SUTURE) IMPLANT
SUT POLY ETHIBOND 5-0 P-3 1X18 (SUTURE) IMPLANT
SUT PROLENE 4 0 P 3 18 (SUTURE) ×1 IMPLANT
SUT SILK 4 0 PS 2 (SUTURE) IMPLANT
SUT VIC AB 3-0 FS2 27 (SUTURE) ×1 IMPLANT
TOWEL OR 17X24 6PK STRL BLUE (TOWEL DISPOSABLE) ×2 IMPLANT
TOWEL OR 17X26 10 PK STRL BLUE (TOWEL DISPOSABLE) ×2 IMPLANT
TUBE CONNECTING 12X1/4 (SUCTIONS) ×1 IMPLANT
WATER STERILE IRR 1000ML POUR (IV SOLUTION) ×2 IMPLANT

## 2016-08-18 NOTE — Op Note (Signed)
NAME:  [name], [name]                    ACCOUNT NO.:  MEDICAL RECORD NO.:  [id number]  LOCATION:                                 FACILITY:  PHYSICIAN:  Anderson Malta, M.D.         DATE OF BIRTH:  DATE OF PROCEDURE: DATE OF DISCHARGE:                              OPERATIVE REPORT   PREOPERATIVE DIAGNOSIS:  Right infected elbow olecranon bursitis.  POSTOPERATIVE DIAGNOSIS:  Right infected elbow olecranon bursitis.  PROCEDURE:  Right elbow olecranon bursectomy.  SURGEON:  Anderson Malta, M.D.  ASSISTANT:  Rush Landmark, PA student.  INDICATIONS:  John Woodward is a patient with right elbow olecranon bursitis, which is chronic.  MRI scan suggested occult walled-off infection.  The patient presents now for operative management.  PROCEDURE IN DETAIL:  The patient was brought to the operating room where general anesthetic was induced.  Preoperative antibiotics administered.  Time-out was called.  The patient was placed in semi- lateral position.  Right elbow prescribed alcohol and Betadine, allowed to air dry, prepped with DuraPrep solution and draped in a sterile manner.  The arm was elevated, but not exsanguinated.  Tourniquet was inflated.  Total tourniquet time 15 minutes at 250 mmHg.  Incision was made over the olecranon bursa.  Care was taken to avoid injury to the ulnar nerve.  Olecranon bursa was excised.  There was a cavity, which was in position within the rind of soft tissue.  This cavity fluid was sent for culture.  Bursa was thoroughly and incompletely excised. Debridement was performed of the remaining surfaces.  Tourniquet was released.  Bleeding points encountered were controlled using bipolar electrocautery.  Vancomycin powder was then placed within the remaining bursal sac.  Skin was excised to create a better closure.  The skin was then tacked down.  The undersurface of the subdermal fascia was tacked down to the fascia on top of the muscle.  This closed some of the  dead space.  Skin was then closed using 3-0 Vicryl and 3-0 nylon.  Impervious dressing was placed along with a splint.  The patient tolerated the procedure well without immediate complication, transferred to the recovery room in stable condition.  The specimen was sent to pathology for analysis.     Anderson Malta, M.D.     GSD/MEDQ  D:  08/18/2016  T:  08/18/2016  Job:  [id number]

## 2016-08-18 NOTE — H&P (Signed)
John Egolf. is an 71 y.o. male.   Chief Complaint: Right elbow mass HPI: John Woodward is a 71 year old patient with right elbow mass.  Been present for over a year and a half.  Did go down after it swelled up year and half ago but then for the past month the swelling has recurred.  He states that this elbow mass interferes with his activities of daily living repetitive work with the right arm.  Denies any discrete fevers or chills.  MRI scan has been obtained which does show small fluid collection within a significant rind of bursal tissue.  He presents now for elective removal of this tissue mass.  He has no history of gout or pseudogout.  Past Medical History:  Diagnosis Date  . Arthritis   . Closed fracture dislocation of right elbow    30 years ago -- fell out of theback of a truck  . Hypertension    Diagnosed 10 years ago. No on medication. Denies history of CAD.     Past Surgical History:  Procedure Laterality Date  . NO PAST SURGERIES      Family History  Problem Relation Age of Onset  . Hypertension Mother   . Dementia Mother        52s  . Transient ischemic attack Mother        9s  . Kidney disease Father   . COPD Father   . Hypertension Brother   . Alcohol abuse Maternal Grandfather   . Mental illness Paternal Grandmother   . Hypertension Daughter   . Healthy Son   . Learning disabilities Son   . Lung cancer Maternal Aunt    Social History:  reports that he has been smoking Cigarettes.  He started smoking about 50 years ago. He has a 50.00 pack-year smoking history. He has never used smokeless tobacco. He reports that he does not drink alcohol or use drugs.  Allergies:  Allergies  Allergen Reactions  . Antibacterial Hand Soap [Triclosan]     Water blisters  . No Known Allergies     Medications Prior to Admission  Medication Sig Dispense Refill  . naproxen sodium (ANAPROX) 220 MG tablet Take 220 mg by mouth daily as needed (for pain.).    Marland Kitchen nicotine  polacrilex ([employer] NICOTINE) 2 MG lozenge Take 1 lozenge (2 mg total) by mouth as needed for smoking cessation. (Patient taking differently: Take 2 mg by mouth every 2 (two) hours as needed for smoking cessation. ) 100 tablet 0  . acetaminophen (TYLENOL) 500 MG tablet Take 500-1,000 mg by mouth every 6 (six) hours as needed (for pain.).      Results for orders placed or performed during the hospital encounter of 08/18/16 (from the past 48 hour(s))  CBC     Status: None   Collection Time: 08/18/16  6:24 AM  Result Value Ref Range   WBC 6.1 4.0 - 10.5 K/uL   RBC 4.75 4.22 - 5.81 MIL/uL   Hemoglobin 14.7 13.0 - 17.0 g/dL   HCT 42.8 39.0 - 52.0 %   MCV 90.1 78.0 - 100.0 fL   MCH 30.9 26.0 - 34.0 pg   MCHC 34.3 30.0 - 36.0 g/dL   RDW 12.8 11.5 - 15.5 %   Platelets 252 150 - 400 K/uL   No results found.  Review of Systems  Musculoskeletal: Positive for joint pain.  All other systems reviewed and are negative.   Blood pressure (!) 187/97, pulse 61, temperature 98.2  F (36.8 C), resp. rate 16, SpO2 96 %. Physical Exam  Constitutional: He appears well-developed.  HENT:  Head: Normocephalic.  Eyes: Pupils are equal, round, and reactive to light.  Neck: Normal range of motion.  Cardiovascular: Normal rate.   Respiratory: Effort normal.  Neurological: He is alert.  Skin: Skin is warm.  Psychiatric: He has a normal mood and affect.    Examination of right elbow demonstrates good range of motion.  Call Paul size olecranon bursitis-type mass is present on the olecranon tip.  There is no fluctuance to this region.  No proximal lymphadenopathy is present.  Motor sensory function to the arm is intact Assessment/Plan Impression is olecranon bursitis right elbow.  MRI scan shows fluid collection within a defined large rind of tissue.  Plan is olecranon bursectomy.  Risks and benefits are discussed.  All questions answered.  Anderson Malta, MD 08/18/2016, 7:06 AM

## 2016-08-18 NOTE — Transfer of Care (Signed)
Immediate Anesthesia Transfer of Care Note  Patient: John Woodward.  Procedure(s) Performed: Procedure(s): OLECRANON BURSECTOMY (Right)  Patient Location: PACU  Anesthesia Type:General  Level of Consciousness: awake, oriented and patient cooperative  Airway & Oxygen Therapy: Patient Spontanous Breathing and Patient connected to nasal cannula oxygen  Post-op Assessment: Report given to RN and Post -op Vital signs reviewed and stable  Post vital signs: Reviewed  Last Vitals:  Vitals:   08/18/16 0913 08/18/16 0915  BP:  (!) 197/67  Pulse:  69  Resp:  16  Temp: (!) 36.3 C     Last Pain:  Vitals:      Patients Stated Pain Goal: 3 (21/58/72 7618)  Complications: No apparent anesthesia complications

## 2016-08-18 NOTE — Anesthesia Procedure Notes (Signed)
Procedure Name: LMA Insertion Date/Time: 08/18/2016 7:51 AM Performed by: Luciana Axe K Pre-anesthesia Checklist: Patient identified, Emergency Drugs available, Suction available and Patient being monitored Patient Re-evaluated:Patient Re-evaluated prior to induction Oxygen Delivery Method: Circle System Utilized Preoxygenation: Pre-oxygenation with 100% oxygen Induction Type: IV induction Ventilation: Mask ventilation without difficulty LMA: LMA inserted LMA Size: 4.0 Number of attempts: 1 Airway Equipment and Method: Bite block Placement Confirmation: positive ETCO2 and breath sounds checked- equal and bilateral Tube secured with: Tape Dental Injury: Teeth and Oropharynx as per pre-operative assessment

## 2016-08-18 NOTE — Anesthesia Postprocedure Evaluation (Signed)
Anesthesia Post Note  Patient: John Woodward.  Procedure(s) Performed: Procedure(s) (LRB): OLECRANON BURSECTOMY (Right)     Patient location during evaluation: PACU Anesthesia Type: General Level of consciousness: awake and alert, patient cooperative and oriented Pain management: pain level controlled Vital Signs Assessment: post-procedure vital signs reviewed and stable Respiratory status: spontaneous breathing, nonlabored ventilation and respiratory function stable Cardiovascular status: blood pressure returned to baseline and stable Postop Assessment: no signs of nausea or vomiting Anesthetic complications: no    Last Vitals:  Vitals:   08/18/16 1015 08/18/16 1030  BP: (!) 162/86 (!) 159/87  Pulse: 73 73  Resp: 18 18  Temp: 36.6 C     Last Pain:  Vitals:   08/18/16 1030  PainSc: 0-No pain                 Siddalee Vanderheiden,E. Sakeena Teall

## 2016-08-18 NOTE — Progress Notes (Signed)
Report given to maryann rn as caregiver 

## 2016-08-18 NOTE — Brief Op Note (Signed)
08/18/2016  9:19 AM  PATIENT:  John Woodward.  71 y.o. male  PRE-OPERATIVE DIAGNOSIS:  RIGHT OLECRANON BURSITITS  POST-OPERATIVE DIAGNOSIS:  RIGHT OLECRANON BURSITITS  PROCEDURE:  Procedure(s): OLECRANON BURSECTOMY  SURGEON:  Surgeon(s): Marlou Sa, Tonna Corner, MD  ASSISTANT: Ladene Artist PA - s  ANESTHESIA:   general  EBL: 15 ml    Total I/O In: 1000 [I.V.:1000] Out: 30 [Blood:30]  BLOOD ADMINISTERED: none  DRAINS: none   LOCAL MEDICATIONS USED: marcaine  SPECIMEN:    COUNTS:  YES  TOURNIQUET:   Total Tourniquet Time Documented: Upper Arm (Right) - 15 minutes Total: Upper Arm (Right) - 15 minutes   DICTATION: .Other Dictation: Dictation Number [id number]  PLAN OF CARE: Discharge to home after PACU  PATIENT DISPOSITION:  PACU - hemodynamically stable

## 2016-08-19 ENCOUNTER — Encounter (HOSPITAL_COMMUNITY): Payer: Self-pay | Admitting: Orthopedic Surgery

## 2016-08-20 ENCOUNTER — Other Ambulatory Visit (INDEPENDENT_AMBULATORY_CARE_PROVIDER_SITE_OTHER): Payer: Self-pay

## 2016-08-20 MED ORDER — DOXYCYCLINE HYCLATE 100 MG PO TABS: ORAL_TABLET | ORAL | 0 refills | Status: DC

## 2016-08-21 ENCOUNTER — Encounter (INDEPENDENT_AMBULATORY_CARE_PROVIDER_SITE_OTHER): Payer: Self-pay | Admitting: Orthopedic Surgery

## 2016-08-21 ENCOUNTER — Ambulatory Visit (INDEPENDENT_AMBULATORY_CARE_PROVIDER_SITE_OTHER): Payer: Medicare HMO | Admitting: Orthopedic Surgery

## 2016-08-21 DIAGNOSIS — M7021 Olecranon bursitis, right elbow: Secondary | ICD-10-CM

## 2016-08-21 NOTE — Progress Notes (Signed)
   Post-Op Visit Note   Patient: John Woodward.           Date of Birth: August 30, 1945           MRN: [id number] Visit Date: 08/21/2016 PCP: Brunetta Jeans, PA-C   Assessment & Plan:  Chief Complaint:  Chief Complaint  Patient presents with  . Right Elbow - Routine Post Op   Visit Diagnoses: No diagnosis found.  Plan: John Woodward is a 71 year old patient 3 days out right olecranon bursectomy.  Did have chronic infection which was walled off.  He is now on doxycycline.  On exam he's got a little bit of recurrent fluid around the bursa.  Incision is intact.  Plan is to see him back in a week.  Possible aspiration.  I want to keep compression on that elbow until then.  Okay for elbow range of motion but no projects such as sanding around the house.  See him back next week.  Follow-Up Instructions: Return in about 1 week (around 08/28/2016).   Orders:  No orders of the defined types were placed in this encounter.  No orders of the defined types were placed in this encounter.   Imaging: No results found.  PMFS History: Patient Active Problem List   Diagnosis Date Noted  . Olecranon bursitis of right elbow 08/11/2016  . Tobacco abuse disorder 08/11/2016  . Essential hypertension 08/11/2016   Past Medical History:  Diagnosis Date  . Arthritis   . Closed fracture dislocation of right elbow    30 years ago -- fell out of theback of a truck  . Hypertension    Diagnosed 10 years ago. No on medication. Denies history of CAD.     Family History  Problem Relation Age of Onset  . Hypertension Mother   . Dementia Mother        33s  . Transient ischemic attack Mother        34s  . Kidney disease Father   . COPD Father   . Hypertension Brother   . Alcohol abuse Maternal Grandfather   . Mental illness Paternal Grandmother   . Hypertension Daughter   . Healthy Son   . Learning disabilities Son   . Lung cancer Maternal Aunt     Past Surgical History:  Procedure Laterality Date   . NO PAST SURGERIES    . OLECRANON BURSECTOMY Right 08/18/2016   Procedure: OLECRANON BURSECTOMY;  Surgeon: Meredith Pel, MD;  Location: [facility];  Service: Orthopedics;  Laterality: Right;   Social History   Occupational History  . Retired     Dealer   Social History Main Topics  . Smoking status: Current Every Day Smoker    Packs/day: 1.00    Years: 50.00    Types: Cigarettes    Start date: 08/12/1966  . Smokeless tobacco: Never Used  . Alcohol use No  . Drug use: No  . Sexual activity: Yes

## 2016-08-23 LAB — AEROBIC/ANAEROBIC CULTURE W GRAM STAIN (SURGICAL/DEEP WOUND)

## 2016-08-23 LAB — AEROBIC/ANAEROBIC CULTURE (SURGICAL/DEEP WOUND)

## 2016-08-25 ENCOUNTER — Ambulatory Visit: Payer: Medicare HMO | Admitting: Physician Assistant

## 2016-08-26 ENCOUNTER — Inpatient Hospital Stay (INDEPENDENT_AMBULATORY_CARE_PROVIDER_SITE_OTHER): Payer: Medicare HMO | Admitting: Orthopedic Surgery

## 2016-08-27 ENCOUNTER — Ambulatory Visit (INDEPENDENT_AMBULATORY_CARE_PROVIDER_SITE_OTHER): Payer: Medicare HMO | Admitting: Orthopedic Surgery

## 2016-08-27 ENCOUNTER — Encounter (INDEPENDENT_AMBULATORY_CARE_PROVIDER_SITE_OTHER): Payer: Self-pay | Admitting: Orthopedic Surgery

## 2016-08-27 DIAGNOSIS — M7021 Olecranon bursitis, right elbow: Secondary | ICD-10-CM

## 2016-08-27 NOTE — Progress Notes (Signed)
   Post-Op Visit Note   Patient: John Woodward.           Date of Birth: 24-May-1945           MRN: [id number] Visit Date: 08/27/2016 PCP: Brunetta Jeans, PA-C   Assessment & Plan:  Chief Complaint:  Chief Complaint  Patient presents with  . Right Elbow - Routine Post Op   Visit Diagnoses:  1. Olecranon bursitis of right elbow     Plan:   As the patient is now about 2 weeks out right elbow olecranon bursal mass excision.  He's been building some type of metal shelf with his son.  That's not really what I anticipated doing after surgery.  On exam he does have some recurrence of the fluid.  That's aspirated today.  We will keep him in a compression Ace wrap for the next week with no activity.  I'll see him back in about 4 weeks for clinical recheck.  He is still finishing up his antibiotics for this staff infection present.  The fluid aspirated today looks like standard postop fluid.  Follow-Up Instructions: Return in about 4 weeks (around 09/24/2016).   Orders:  No orders of the defined types were placed in this encounter.  No orders of the defined types were placed in this encounter.   Imaging: No results found.  PMFS History: Patient Active Problem List   Diagnosis Date Noted  . Olecranon bursitis of right elbow 08/11/2016  . Tobacco abuse disorder 08/11/2016  . Essential hypertension 08/11/2016   Past Medical History:  Diagnosis Date  . Arthritis   . Closed fracture dislocation of right elbow    30 years ago -- fell out of theback of a truck  . Hypertension    Diagnosed 10 years ago. No on medication. Denies history of CAD.     Family History  Problem Relation Age of Onset  . Hypertension Mother   . Dementia Mother        51s  . Transient ischemic attack Mother        97s  . Kidney disease Father   . COPD Father   . Hypertension Brother   . Alcohol abuse Maternal Grandfather   . Mental illness Paternal Grandmother   . Hypertension Daughter   .  Healthy Son   . Learning disabilities Son   . Lung cancer Maternal Aunt     Past Surgical History:  Procedure Laterality Date  . NO PAST SURGERIES    . OLECRANON BURSECTOMY Right 08/18/2016   Procedure: OLECRANON BURSECTOMY;  Surgeon: Meredith Pel, MD;  Location: [facility];  Service: Orthopedics;  Laterality: Right;   Social History   Occupational History  . Retired     Dealer   Social History Main Topics  . Smoking status: Current Every Day Smoker    Packs/day: 1.00    Years: 50.00    Types: Cigarettes    Start date: 08/12/1966  . Smokeless tobacco: Never Used  . Alcohol use No  . Drug use: No  . Sexual activity: Yes

## 2016-08-31 ENCOUNTER — Encounter (INDEPENDENT_AMBULATORY_CARE_PROVIDER_SITE_OTHER): Payer: Self-pay | Admitting: Orthopedic Surgery

## 2016-08-31 ENCOUNTER — Ambulatory Visit (INDEPENDENT_AMBULATORY_CARE_PROVIDER_SITE_OTHER): Payer: Medicare HMO | Admitting: Orthopedic Surgery

## 2016-08-31 DIAGNOSIS — M7021 Olecranon bursitis, right elbow: Secondary | ICD-10-CM

## 2016-09-01 NOTE — Progress Notes (Signed)
   Post-Op Visit Note   Patient: John Woodward.           Date of Birth: 11/08/1945           MRN: [id number] Visit Date: 08/31/2016 PCP: Brunetta Jeans, PA-C   Assessment & Plan:  Chief Complaint:  Chief Complaint  Patient presents with  . Right Elbow - Wound Check   Visit Diagnoses:  1. Olecranon bursitis of right elbow     Plan: John Woodward is a 71 year old patient who underwent right olecranon bursa excision 08/18/2016.  He currently finished his antibiotics.  He has been doing much more than recommended with the right elbow.  On exam he has probably about 3 or 4 mL fluid collection which is less than he had last clinic visit.  On her to take Aleve 1 tablet a day for next month just to decrease inflammation.  Continue with Ace wrap on the elbow.  6 week return for final clinical recheck and release  Follow-Up Instructions: Return in about 6 weeks (around 10/12/2016).   Orders:  No orders of the defined types were placed in this encounter.  No orders of the defined types were placed in this encounter.   Imaging: No results found.  PMFS History: Patient Active Problem List   Diagnosis Date Noted  . Olecranon bursitis of right elbow 08/11/2016  . Tobacco abuse disorder 08/11/2016  . Essential hypertension 08/11/2016   Past Medical History:  Diagnosis Date  . Arthritis   . Closed fracture dislocation of right elbow    30 years ago -- fell out of theback of a truck  . Hypertension    Diagnosed 10 years ago. No on medication. Denies history of CAD.     Family History  Problem Relation Age of Onset  . Hypertension Mother   . Dementia Mother        84s  . Transient ischemic attack Mother        45s  . Kidney disease Father   . COPD Father   . Hypertension Brother   . Alcohol abuse Maternal Grandfather   . Mental illness Paternal Grandmother   . Hypertension Daughter   . Healthy Son   . Learning disabilities Son   . Lung cancer Maternal Aunt     Past  Surgical History:  Procedure Laterality Date  . NO PAST SURGERIES    . OLECRANON BURSECTOMY Right 08/18/2016   Procedure: OLECRANON BURSECTOMY;  Surgeon: Meredith Pel, MD;  Location: [facility];  Service: Orthopedics;  Laterality: Right;   Social History   Occupational History  . Retired     Dealer   Social History Main Topics  . Smoking status: Current Every Day Smoker    Packs/day: 1.00    Years: 50.00    Types: Cigarettes    Start date: 08/12/1966  . Smokeless tobacco: Never Used  . Alcohol use No  . Drug use: No  . Sexual activity: Yes

## 2016-09-10 ENCOUNTER — Ambulatory Visit: Payer: Medicare HMO

## 2016-09-10 ENCOUNTER — Encounter: Payer: Self-pay | Admitting: Physician Assistant

## 2016-09-10 ENCOUNTER — Ambulatory Visit (INDEPENDENT_AMBULATORY_CARE_PROVIDER_SITE_OTHER): Payer: Medicare HMO | Admitting: Physician Assistant

## 2016-09-10 VITALS — BP 158/88 | HR 73 | Temp 98.0°F | Resp 18 | Ht 71.0 in | Wt 201.0 lb

## 2016-09-10 DIAGNOSIS — Z1159 Encounter for screening for other viral diseases: Secondary | ICD-10-CM | POA: Diagnosis not present

## 2016-09-10 DIAGNOSIS — Z125 Encounter for screening for malignant neoplasm of prostate: Secondary | ICD-10-CM

## 2016-09-10 DIAGNOSIS — Z0000 Encounter for general adult medical examination without abnormal findings: Secondary | ICD-10-CM | POA: Diagnosis not present

## 2016-09-10 DIAGNOSIS — I10 Essential (primary) hypertension: Secondary | ICD-10-CM | POA: Diagnosis not present

## 2016-09-10 DIAGNOSIS — Z720 Tobacco use: Secondary | ICD-10-CM | POA: Diagnosis not present

## 2016-09-10 DIAGNOSIS — Z23 Encounter for immunization: Secondary | ICD-10-CM | POA: Diagnosis not present

## 2016-09-10 LAB — URINALYSIS, ROUTINE W REFLEX MICROSCOPIC
Bilirubin Urine: NEGATIVE
Hgb urine dipstick: NEGATIVE
Ketones, ur: NEGATIVE
Leukocytes, UA: NEGATIVE
Nitrite: NEGATIVE
PH: 6 (ref 5.0–8.0)
RBC / HPF: NONE SEEN (ref 0–?)
SPECIFIC GRAVITY, URINE: 1.025 (ref 1.000–1.030)
Total Protein, Urine: 100 — AB
UROBILINOGEN UA: 0.2 (ref 0.0–1.0)
Urine Glucose: >=1000 — AB

## 2016-09-10 LAB — PSA: PSA: 0.22 ng/mL (ref 0.10–4.00)

## 2016-09-10 LAB — COMPREHENSIVE METABOLIC PANEL
ALT: 17 U/L (ref 0–53)
AST: 13 U/L (ref 0–37)
Albumin: 4.1 g/dL (ref 3.5–5.2)
Alkaline Phosphatase: 86 U/L (ref 39–117)
BUN: 18 mg/dL (ref 6–23)
CALCIUM: 9.5 mg/dL (ref 8.4–10.5)
CHLORIDE: 99 meq/L (ref 96–112)
CO2: 28 meq/L (ref 19–32)
Creatinine, Ser: 1.04 mg/dL (ref 0.40–1.50)
GFR: 74.89 mL/min (ref >60.00)
GLUCOSE: 262 mg/dL — AB (ref 70–99)
Potassium: 4.3 mEq/L (ref 3.5–5.1)
SODIUM: 134 meq/L — AB (ref 135–145)
Total Bilirubin: 0.5 mg/dL (ref 0.2–1.2)
Total Protein: 6.9 g/dL (ref 6.0–8.3)

## 2016-09-10 LAB — LIPID PANEL
Cholesterol: 268 mg/dL — ABNORMAL HIGH (ref 0–200)
HDL: 32 mg/dL — ABNORMAL LOW (ref >39.00)
Total CHOL/HDL Ratio: 8

## 2016-09-10 LAB — CBC
HCT: 50.4 % (ref 39.0–52.0)
Hemoglobin: 16.9 g/dL (ref 13.0–17.0)
MCHC: 33.5 g/dL (ref 30.0–36.0)
MCV: 95.2 fl (ref 78.0–100.0)
Platelets: 239 10*3/uL (ref 150.0–400.0)
RBC: 5.29 Mil/uL (ref 4.22–5.81)
RDW: 13.8 % (ref 11.5–15.5)
WBC: 5.6 10*3/uL (ref 4.0–10.5)

## 2016-09-10 LAB — LDL CHOLESTEROL, DIRECT: Direct LDL: 114 mg/dL

## 2016-09-10 MED ORDER — LOSARTAN POTASSIUM 50 MG PO TABS: 50.0000 mg | ORAL_TABLET | Freq: Every day | ORAL | 3 refills | Status: DC

## 2016-09-10 MED ORDER — VARENICLINE TARTRATE 0.5 MG X 11 & 1 MG X 42 PO MISC: ORAL | 0 refills | Status: DC

## 2016-09-10 NOTE — Progress Notes (Signed)
Patient presents to clinic today for annual exam.  Patient is fasting for labs. Body mass index is 28.03 kg/m.   Diet -- Patient endorses a well-balanced diet overall. Does have high salt intake per patient. Is hydrating well.  Exercise -- No regimen at present.   Acute Concerns: Denies acute concerns at today's visit.   Chronic Issues: Tobacco Use Disorder -- Patient with a 45 pack-year smoking history. Patient has quit previously for 6 months with lozenges, but restarted. Patient would like to stop smoking at present. He feels he is ready to make the change. Would like to discuss options. Denies known history of COPD. Endorses morning cough with mild congestion that is clear/white. Denies SOB, chest tightness or wheezing. Has never had lung cancer screening before.   Hypertension -- Patient endorses long-standing history of intermittent elevated BP but was never placed on medication. Denies history of known CAD. Denies history of elevated cholesterol. Patient denies chest pain, palpitations, lightheadedness, dizziness, vision changes or frequent headaches.  BP Readings from Last 3 Encounters:  09/10/16 (!) 158/88  08/18/16 (!) 159/87  08/11/16 (!) 190/110   Health Maintenance: Reviewed in AWV with RN. See RN note.   Past Medical History:  Diagnosis Date  . Arthritis   . Closed fracture dislocation of right elbow    30 years ago -- fell out of theback of a truck  . Hypertension    Diagnosed 10 years ago. No on medication. Denies history of CAD.     Past Surgical History:  Procedure Laterality Date  . NO PAST SURGERIES    . OLECRANON BURSECTOMY Right 08/18/2016   Procedure: OLECRANON BURSECTOMY;  Surgeon: Meredith Pel, MD;  Location: [facility];  Service: Orthopedics;  Laterality: Right;    Current Outpatient Prescriptions on File Prior to Visit  Medication Sig Dispense Refill  . acetaminophen (TYLENOL) 500 MG tablet Take 500-1,000 mg by mouth every 6 (six) hours as  needed (for pain.).    Marland Kitchen naproxen sodium (ANAPROX) 220 MG tablet Take 220 mg by mouth daily as needed (for pain.).     No current facility-administered medications on file prior to visit.     Allergies  Allergen Reactions  . Antibacterial Hand Soap [Triclosan]     Water blisters  . No Known Allergies     Family History  Problem Relation Age of Onset  . Hypertension Mother   . Dementia Mother        49s  . Transient ischemic attack Mother        45s  . Kidney disease Father   . COPD Father   . Hypertension Brother   . Alcohol abuse Maternal Grandfather   . Mental illness Paternal Grandmother   . Hypertension Daughter   . Healthy Son   . Learning disabilities Son   . Lung cancer Maternal Aunt     Social History   Social History  . Marital status: Married    Spouse name: N/A  . Number of children: 3  . Years of education: N/A   Occupational History  . Retired     Dealer   Social History Main Topics  . Smoking status: Current Every Day Smoker    Packs/day: 1.00    Years: 50.00    Types: Cigarettes    Start date: 08/12/1966  . Smokeless tobacco: Never Used  . Alcohol use No  . Drug use: No  . Sexual activity: Yes   Other Topics Concern  . Not  on file   Social History Narrative  . No narrative on file   Review of Systems  Constitutional: Negative for fever and weight loss.  HENT: Negative for ear discharge, ear pain, hearing loss and tinnitus.   Eyes: Negative for blurred vision, double vision, photophobia and pain.  Respiratory: Positive for cough and sputum production. Negative for shortness of breath.   Cardiovascular: Negative for chest pain and palpitations.  Gastrointestinal: Negative for abdominal pain, blood in stool, constipation, diarrhea, heartburn, melena, nausea and vomiting.  Genitourinary: Negative for dysuria, flank pain, frequency, hematuria and urgency.  Musculoskeletal: Negative for falls.  Neurological: Negative for dizziness, loss  of consciousness and headaches.  Endo/Heme/Allergies: Negative for environmental allergies.  Psychiatric/Behavioral: Negative for depression, hallucinations, substance abuse and suicidal ideas. The patient is not nervous/anxious and does not have insomnia.    BP (!) 158/88 (BP Location: Left Arm, Patient Position: Sitting, Cuff Size: Large)   Pulse 73   Temp 98 F (36.7 C)   Resp 18   Ht '5\' 11"'$  (1.803 m)   Wt 201 lb (91.2 kg)   SpO2 97%   BMI 28.03 kg/m   Physical Exam  Constitutional: He is well-developed, well-nourished, and in no distress.  HENT:  Head: Normocephalic and atraumatic.  Right Ear: External ear normal.  Left Ear: External ear normal.  Nose: Nose normal.  Mouth/Throat: Oropharynx is clear and moist. No oropharyngeal exudate.  TM within normal limits bilaterally.   Eyes: Pupils are equal, round, and reactive to light. Conjunctivae are normal.  Neck: Neck supple.  Cardiovascular: Normal rate, regular rhythm, normal heart sounds and intact distal pulses.   Pulmonary/Chest: Effort normal and breath sounds normal. No respiratory distress. He has no wheezes. He has no rales. He exhibits no tenderness.  Abdominal: Soft. Bowel sounds are normal. He exhibits no distension. There is no tenderness.  Lymphadenopathy:    He has no cervical adenopathy.  Neurological: He is alert.  Skin: Skin is warm and dry. No rash noted.  Psychiatric: Affect normal.  Vitals reviewed.  Recent Results (from the past 2160 hour(s))  I-STAT creatinine     Status: Abnormal   Collection Time: 08/12/16  9:20 PM  Result Value Ref Range   Creatinine, Ser 1.30 (H) 0.61 - 1.24 mg/dL  Basic metabolic panel     Status: Abnormal   Collection Time: 08/18/16  6:24 AM  Result Value Ref Range   Sodium 135 135 - 145 mmol/L   Potassium 3.8 3.5 - 5.1 mmol/L   Chloride 103 101 - 111 mmol/L   CO2 23 22 - 32 mmol/L   Glucose, Bld 243 (H) 65 - 99 mg/dL   BUN 17 6 - 20 mg/dL   Creatinine, Ser 1.06 0.61 -  1.24 mg/dL   Calcium 8.9 8.9 - 10.3 mg/dL   GFR calc non Af Amer >60 >60 mL/min   GFR calc Af Amer >60 >60 mL/min    Comment: (NOTE) The eGFR has been calculated using the CKD EPI equation. This calculation has not been validated in all clinical situations. eGFR's persistently <60 mL/min signify possible Chronic Kidney Disease.    Anion gap 9 5 - 15  CBC     Status: None   Collection Time: 08/18/16  6:24 AM  Result Value Ref Range   WBC 6.1 4.0 - 10.5 K/uL   RBC 4.75 4.22 - 5.81 MIL/uL   Hemoglobin 14.7 13.0 - 17.0 g/dL   HCT 42.8 39.0 - 52.0 %   MCV  90.1 78.0 - 100.0 fL   MCH 30.9 26.0 - 34.0 pg   MCHC 34.3 30.0 - 36.0 g/dL   RDW 12.8 11.5 - 15.5 %   Platelets 252 150 - 400 K/uL  Aerobic/Anaerobic Culture (surgical/deep wound)     Status: None   Collection Time: 08/18/16  8:31 AM  Result Value Ref Range   Specimen Description WOUND    Special Requests CHRONIC OLECRANON BURSA FLUID POF ANCEF    Gram Stain      FEW WBC PRESENT, PREDOMINANTLY PMN NO ORGANISMS SEEN    Culture FEW STAPHYLOCOCCUS AUREUS NO ANAEROBES ISOLATED     Report Status 08/23/2016 FINAL    Organism ID, Bacteria STAPHYLOCOCCUS AUREUS       Susceptibility   Staphylococcus aureus - MIC*    CIPROFLOXACIN <=0.5 SENSITIVE Sensitive     ERYTHROMYCIN <=0.25 SENSITIVE Sensitive     GENTAMICIN <=0.5 SENSITIVE Sensitive     OXACILLIN <=0.25 SENSITIVE Sensitive     TETRACYCLINE <=1 SENSITIVE Sensitive     VANCOMYCIN 1 SENSITIVE Sensitive     TRIMETH/SULFA <=10 SENSITIVE Sensitive     CLINDAMYCIN <=0.25 SENSITIVE Sensitive     RIFAMPIN <=0.5 SENSITIVE Sensitive     Inducible Clindamycin NEGATIVE Sensitive     * FEW STAPHYLOCOCCUS AUREUS  Hepatitis C Antibody     Status: None   Collection Time: 09/10/16  9:50 AM  Result Value Ref Range   HCV Ab NON-REACTIVE NON-REACTIVE    Comment:                                                                        This test is for screening purposes only.  Reactive  results should be confirmed by an alternative method.  Suggest HCV Qualitative, PCR, test code 83130.  Specimens will be stable for reflex testing up to 3 days after collection.   CBC     Status: None   Collection Time: 09/10/16  9:50 AM  Result Value Ref Range   WBC 5.6 4.0 - 10.5 K/uL   RBC 5.29 4.22 - 5.81 Mil/uL   Platelets 239.0 150.0 - 400.0 K/uL   Hemoglobin 16.9 13.0 - 17.0 g/dL   HCT 50.4 39.0 - 52.0 %   MCV 95.2 78.0 - 100.0 fl   MCHC 33.5 30.0 - 36.0 g/dL   RDW 13.8 11.5 - 15.5 %  Comprehensive metabolic panel     Status: Abnormal   Collection Time: 09/10/16  9:50 AM  Result Value Ref Range   Sodium 134 (L) 135 - 145 mEq/L   Potassium 4.3 3.5 - 5.1 mEq/L   Chloride 99 96 - 112 mEq/L   CO2 28 19 - 32 mEq/L   Glucose, Bld 262 (H) 70 - 99 mg/dL   BUN 18 6 - 23 mg/dL   Creatinine, Ser 1.04 0.40 - 1.50 mg/dL   Total Bilirubin 0.5 0.2 - 1.2 mg/dL   Alkaline Phosphatase 86 39 - 117 U/L   AST 13 0 - 37 U/L   ALT 17 0 - 53 U/L   Total Protein 6.9 6.0 - 8.3 g/dL   Albumin 4.1 3.5 - 5.2 g/dL   Calcium 9.5 8.4 - 10.5 mg/dL   GFR 74.89 >60.00 mL/min  Lipid panel     Status: Abnormal   Collection Time: 09/10/16  9:50 AM  Result Value Ref Range   Cholesterol 268 (H) 0 - 200 mg/dL    Comment: ATP III Classification       Desirable:  < 200 mg/dL               Borderline High:  200 - 239 mg/dL          High:  > = 240 mg/dL   Triglycerides (H) 0.0 - 149.0 mg/dL    572.0 Triglyceride is over 400; calculations on Lipids are invalid.    Comment: Normal:  <150 mg/dLBorderline High:  150 - 199 mg/dL   HDL 32.00 (L) >39.00 mg/dL   Total CHOL/HDL Ratio 8     Comment:                Men          Women1/2 Average Risk     3.4          3.3Average Risk          5.0          4.42X Average Risk          9.6          7.13X Average Risk          15.0          11.0                      PSA     Status: None   Collection Time: 09/10/16  9:50 AM  Result Value Ref Range   PSA 0.22 0.10 - 4.00  ng/mL    Comment: Test performed using Access Hybritech PSA Assay, a parmagnetic partical, chemiluminecent immunoassay.  Urinalysis, Routine w reflex microscopic     Status: Abnormal   Collection Time: 09/10/16  9:50 AM  Result Value Ref Range   Color, Urine YELLOW Yellow;Lt. Yellow   APPearance CLEAR Clear   Specific Gravity, Urine 1.025 1.000 - 1.030   pH 6.0 5.0 - 8.0   Total Protein, Urine 100 (A) Negative   Urine Glucose >=1000 (A) Negative   Ketones, ur NEGATIVE Negative   Bilirubin Urine NEGATIVE Negative   Hgb urine dipstick NEGATIVE Negative   Urobilinogen, UA 0.2 0.0 - 1.0   Leukocytes, UA NEGATIVE Negative   Nitrite NEGATIVE Negative   WBC, UA 0-2/hpf 0-2/hpf   RBC / HPF none seen 0-2/hpf   Mucus, UA Presence of (A) None   Squamous Epithelial / LPF Rare(0-4/hpf) Rare(0-4/hpf)  LDL cholesterol, direct     Status: None   Collection Time: 09/10/16  9:50 AM  Result Value Ref Range   Direct LDL 114.0 mg/dL    Comment: Optimal:  <100 mg/dLNear or Above Optimal:  100-129 mg/dLBorderline High:  130-159 mg/dLHigh:  160-189 mg/dLVery High:  >190 mg/dL  Hemoglobin A1c     Status: Abnormal   Collection Time: 09/11/16 11:09 AM  Result Value Ref Range   Hgb A1c MFr Bld 11.9 (H) 4.6 - 6.5 %    Comment: Glycemic Control Guidelines for People with Diabetes:Non Diabetic:  <6%Goal of Therapy: <7%Additional Action Suggested:  >8%     Assessment/Plan: Tobacco abuse disorder Discussed need for cessation. Is agreeable to starting Chantix. Will also refer for lung cancer screening giving current smoking and pack-year history.   Prostate cancer screening The natural history of prostate cancer and ongoing controversy regarding  screening and potential treatment outcomes of prostate cancer has been discussed with the patient. The meaning of a false positive PSA and a false negative PSA has been discussed. He indicates understanding of the limitations of this screening test and wishes  to proceed  with screening PSA testing.   Essential hypertension Still elevated. Asymptomatic. Will start losartan 50. Continue DASH diet. Close follow-up scheduled. Labs today.  Encounter for Medicare annual wellness exam Performed by RN. RN note reviewed.   Annual physical exam Depression screen negative. Health Maintenance reviewed -- Scheduled for lung cancer screen. Pneumonia vaccine given today. Has been given advanced directives to fill out. Cologuard ordered.. Preventive schedule discussed and handout given in AVS. Will obtain fasting labs today.     Leeanne Rio, PA-C

## 2016-09-10 NOTE — Progress Notes (Signed)
Subjective:   John Nicastro. is a 71 y.o. male who presents for an Initial Medicare Annual Wellness Visit.  Review of Systems  No ROS.  Medicare Wellness Visit. Additional risk factors are reflected in the social history.  Cardiac Risk Factors include: family history of premature cardiovascular disease;advanced age (>66men, >68 women);male gender;hypertension;smoking/ tobacco exposure   Sleep patterns: Sleeps 9 hours, feels rested. Up to void x 2.  Home Safety/Smoke Alarms: Feels safe in home. Smoke alarms in place.  Living environment; residence and Firearm Safety: Lives with wife and 2 sons in 1 story home.  Seat Belt Safety/Bike Helmet: Wears seat belt.   Counseling:   Eye Exam-Only with drivers license exam. Will make appt Dental-Last exam > 6 years. Will make appt.   Male:   CCS-Never. Cologuard ordered.    PSA- No results found for: PSA     Objective:    Today's Vitals   09/10/16 0840  BP: (!) 180/100  Pulse: 73  Resp: 18  Temp: 98 F (36.7 C)  SpO2: 97%  Weight: 201 lb (91.2 kg)  Height: 5\' 11"  (1.803 m)   Body mass index is 28.03 kg/m.  Current Medications (verified) Outpatient Encounter Prescriptions as of 09/10/2016  Medication Sig  . acetaminophen (TYLENOL) 500 MG tablet Take 500-1,000 mg by mouth every 6 (six) hours as needed (for pain.).  Marland Kitchen naproxen sodium (ANAPROX) 220 MG tablet Take 220 mg by mouth daily as needed (for pain.).  Marland Kitchen nicotine polacrilex ([employer] NICOTINE) 2 MG lozenge Take 1 lozenge (2 mg total) by mouth as needed for smoking cessation. (Patient not taking: Reported on 09/10/2016)  . [DISCONTINUED] doxycycline (VIBRA-TABS) 100 MG tablet 1 po BID x 10 days   No facility-administered encounter medications on file as of 09/10/2016.     Allergies (verified) Antibacterial hand soap [triclosan] and No known allergies   History: Past Medical History:  Diagnosis Date  . Arthritis   . Closed fracture dislocation of right elbow    30  years ago -- fell out of theback of a truck  . Hypertension    Diagnosed 10 years ago. No on medication. Denies history of CAD.    Past Surgical History:  Procedure Laterality Date  . NO PAST SURGERIES    . OLECRANON BURSECTOMY Right 08/18/2016   Procedure: OLECRANON BURSECTOMY;  Surgeon: Meredith Pel, MD;  Location: [facility];  Service: Orthopedics;  Laterality: Right;   Family History  Problem Relation Age of Onset  . Hypertension Mother   . Dementia Mother        23s  . Transient ischemic attack Mother        31s  . Kidney disease Father   . COPD Father   . Hypertension Brother   . Alcohol abuse Maternal Grandfather   . Mental illness Paternal Grandmother   . Hypertension Daughter   . Healthy Son   . Learning disabilities Son   . Lung cancer Maternal Aunt    Social History   Occupational History  . Retired     Dealer   Social History Main Topics  . Smoking status: Current Every Day Smoker    Packs/day: 1.00    Years: 50.00    Types: Cigarettes    Start date: 08/12/1966  . Smokeless tobacco: Never Used  . Alcohol use No  . Drug use: No  . Sexual activity: Yes   Tobacco Counseling Ready to quit: Yes Counseling given: Yes   Activities of Daily  Living In your present state of health, do you have any difficulty performing the following activities: 09/10/2016 08/11/2016  Hearing? N N  Vision? N N  Difficulty concentrating or making decisions? N N  Walking or climbing stairs? N N  Dressing or bathing? N N  Doing errands, shopping? N N  Preparing Food and eating ? N -  Using the Toilet? N -  In the past six months, have you accidently leaked urine? N -  Do you have problems with loss of bowel control? N -  Managing your Medications? N -  Managing your Finances? N -  Housekeeping or managing your Housekeeping? N -  Some recent data might be hidden    Immunizations and Health Maintenance There is no immunization history for the selected administration  types on file for this patient. Health Maintenance Due  Topic Date Due  . Hepatitis C Screening  [birth year]  . DTaP/Tdap/Td (1 - Tdap) 12/26/1964  . TETANUS/TDAP  12/26/1964  . COLONOSCOPY  12/27/1995  . PNA vac Low Risk Adult (1 of 2 - PCV13) 12/27/2010  . INFLUENZA VACCINE  08/26/2016    Patient Care Team: Delorse Limber as PCP - General (Family Medicine) Gerda Diss, DO as Consulting Physician (Family Medicine)  Indicate any recent Medical Services you may have received from other than [facility] providers in the past year (date may be approximate).    Assessment:   This is a routine wellness examination for [place]. Physical assessment deferred to PCP.   Hearing/Vision screen Hearing Screening Comments: Able to hear conversational tones w/o difficulty. No issues reported.   Vision Screening Comments: Wears reading glasses.   Dietary issues and exercise activities discussed: Current Exercise Habits: The patient does not participate in regular exercise at present ("working in the shop", vehicles), Exercise limited by: None identified   Diet (meal preparation, eat out, water intake, caffeinated beverages, dairy products, fruits and vegetables): Drinks water  Breakfast: cereal, coffee Lunch: sandwich Dinner: protein and vegetables, potatoes  Snacks on peanut butter crackers, chocolate milk  Goals    . Quit smoking / using tobacco          Quit smoking      Depression Screen PHQ 2/9 Scores 09/10/2016 08/11/2016  PHQ - 2 Score 0 0  PHQ- 9 Score - 0    Fall Risk Fall Risk  09/10/2016 08/11/2016  Falls in the past year? No No    Cognitive Function:       Ad8 score reviewed for issues:  Issues making decisions: no  Less interest in hobbies / activities: no  Repeats questions, stories (family complaining): no  Trouble using ordinary gadgets (microwave, computer, phone): no  Forgets the month or year: no  Mismanaging finances: no  Remembering appts:  no  Daily problems with thinking and/or memory: no Ad8 score is= 0     Screening Tests Health Maintenance  Topic Date Due  . Hepatitis C Screening  [birth year]  . DTaP/Tdap/Td (1 - Tdap) 12/26/1964  . TETANUS/TDAP  12/26/1964  . COLONOSCOPY  12/27/1995  . PNA vac Low Risk Adult (1 of 2 - PCV13) 12/27/2010  . INFLUENZA VACCINE  08/26/2016        Plan:    Make eye and dentist appointment.   Complete cologuard test  Bring a copy of your living will and/or healthcare power of attorney to your next office visit.  Continue doing brain stimulating activities (puzzles, reading, adult coloring books, staying active) to keep memory  sharp.     I have personally reviewed and noted the following in the patient's chart:   . Medical and social history . Use of alcohol, tobacco or illicit drugs  . Current medications and supplements . Functional ability and status . Nutritional status . Physical activity . Advanced directives . List of other physicians . Hospitalizations, surgeries, and ER visits in previous 12 months . Vitals . Screenings to include cognitive, depression, and falls . Referrals and appointments  In addition, I have reviewed and discussed with patient certain preventive protocols, quality metrics, and best practice recommendations. A written personalized care plan for preventive services as well as general preventive health recommendations were provided to patient.     Gerilyn Nestle, RN   09/10/2016

## 2016-09-10 NOTE — Patient Instructions (Addendum)
Instructions from [place]: Please go to the lab for blood work.   Our office will call you with your results unless you have chosen to receive results via MyChart.  If your blood work is normal we will follow-up each year for physicals and as scheduled for chronic medical problems.  If anything is abnormal we will treat accordingly and get you in for a follow-up.  Please start the losartan daily to help lower BP. Follow the dietary recommendations below. Start the Chantix as directed to help you stop smoking. I am setting you up for lung cancer screening.  Follow-up with me in 2 weeks for reassessment of BP.   Instructions from [place]:  Make eye and dentist appointment.   Complete cologuard test  Bring a copy of your living will and/or healthcare power of attorney to your next office visit.  Continue doing brain stimulating activities (puzzles, reading, adult coloring books, staying active) to keep memory sharp.      DASH Eating Plan DASH stands for "Dietary Approaches to Stop Hypertension." The DASH eating plan is a healthy eating plan that has been shown to reduce high blood pressure (hypertension). It may also reduce your risk for type 2 diabetes, heart disease, and stroke. The DASH eating plan may also help with weight loss. What are tips for following this plan? General guidelines  Avoid eating more than 2,300 mg (milligrams) of salt (sodium) a day. If you have hypertension, you may need to reduce your sodium intake to 1,500 mg a day.  Limit alcohol intake to no more than 1 drink a day for nonpregnant women and 2 drinks a day for men. One drink equals 12 oz of beer, 5 oz of wine, or 1 oz of hard liquor.  Work with your health care provider to maintain a healthy body weight or to lose weight. Ask what an ideal weight is for you.  Get at least 30 minutes of exercise that causes your heart to beat faster (aerobic exercise) most days of the week. Activities may include walking,  swimming, or biking.  Work with your health care provider or diet and nutrition specialist (dietitian) to adjust your eating plan to your individual calorie needs. Reading food labels  Check food labels for the amount of sodium per serving. Choose foods with less than 5 percent of the Daily Value of sodium. Generally, foods with less than 300 mg of sodium per serving fit into this eating plan.  To find whole grains, look for the word "whole" as the first word in the ingredient list. Shopping  Buy products labeled as "low-sodium" or "no salt added."  Buy fresh foods. Avoid canned foods and premade or frozen meals. Cooking  Avoid adding salt when cooking. Use salt-free seasonings or herbs instead of table salt or sea salt. Check with your health care provider or pharmacist before using salt substitutes.  Do not fry foods. Cook foods using healthy methods such as baking, boiling, grilling, and broiling instead.  Cook with heart-healthy oils, such as olive, canola, soybean, or sunflower oil. Meal planning   Eat a balanced diet that includes: ? 5 or more servings of fruits and vegetables each day. At each meal, try to fill half of your plate with fruits and vegetables. ? Up to 6-8 servings of whole grains each day. ? Less than 6 oz of lean meat, poultry, or fish each day. A 3-oz serving of meat is about the same size as a deck of cards. One egg equals  1 oz. ? 2 servings of low-fat dairy each day. ? A serving of nuts, seeds, or beans 5 times each week. ? Heart-healthy fats. Healthy fats called Omega-3 fatty acids are found in foods such as flaxseeds and coldwater fish, like sardines, salmon, and mackerel.  Limit how much you eat of the following: ? Canned or prepackaged foods. ? Food that is high in trans fat, such as fried foods. ? Food that is high in saturated fat, such as fatty meat. ? Sweets, desserts, sugary drinks, and other foods with added sugar. ? Full-fat dairy  products.  Do not salt foods before eating.  Try to eat at least 2 vegetarian meals each week.  Eat more home-cooked food and less restaurant, buffet, and fast food.  When eating at a restaurant, ask that your food be prepared with less salt or no salt, if possible. What foods are recommended? The items listed may not be a complete list. Talk with your dietitian about what dietary choices are best for you. Grains Whole-grain or whole-wheat bread. Whole-grain or whole-wheat pasta. Brown rice. [place]. Bulgur. Whole-grain and low-sodium cereals. Pita bread. Low-fat, low-sodium crackers. Whole-wheat flour tortillas. Vegetables Fresh or frozen vegetables (raw, steamed, roasted, or grilled). Low-sodium or reduced-sodium tomato and vegetable juice. Low-sodium or reduced-sodium tomato sauce and tomato paste. Low-sodium or reduced-sodium canned vegetables. Fruits All fresh, dried, or frozen fruit. Canned fruit in natural juice (without added sugar). Meat and other protein foods Skinless chicken or [place]. Ground chicken or [place]. Pork with fat trimmed off. Fish and seafood. Egg whites. Dried beans, peas, or lentils. Unsalted nuts, nut butters, and seeds. Unsalted canned beans. Lean cuts of beef with fat trimmed off. Low-sodium, lean deli meat. Dairy Low-fat (1%) or fat-free (skim) milk. Fat-free, low-fat, or reduced-fat cheeses. Nonfat, low-sodium ricotta or cottage cheese. Low-fat or nonfat yogurt. Low-fat, low-sodium cheese. Fats and oils Soft margarine without trans fats. Vegetable oil. Low-fat, reduced-fat, or light mayonnaise and salad dressings (reduced-sodium). Canola, safflower, olive, soybean, and sunflower oils. Avocado. Seasoning and other foods Herbs. Spices. Seasoning mixes without salt. Unsalted popcorn and pretzels. Fat-free sweets. What foods are not recommended? The items listed may not be a complete list. Talk with your dietitian about what dietary choices are best for  you. Grains Baked goods made with fat, such as croissants, muffins, or some breads. Dry pasta or rice meal packs. Vegetables Creamed or fried vegetables. Vegetables in a cheese sauce. Regular canned vegetables (not low-sodium or reduced-sodium). Regular canned tomato sauce and paste (not low-sodium or reduced-sodium). Regular tomato and vegetable juice (not low-sodium or reduced-sodium). Angie Fava. Olives. Fruits Canned fruit in a light or heavy syrup. Fried fruit. Fruit in cream or butter sauce. Meat and other protein foods Fatty cuts of meat. Ribs. Fried meat. Berniece Salines. Sausage. Bologna and other processed lunch meats. Salami. Fatback. Hotdogs. Bratwurst. Salted nuts and seeds. Canned beans with added salt. Canned or smoked fish. Whole eggs or egg yolks. Chicken or [place] with skin. Dairy Whole or 2% milk, cream, and half-and-half. Whole or full-fat cream cheese. Whole-fat or sweetened yogurt. Full-fat cheese. Nondairy creamers. Whipped toppings. Processed cheese and cheese spreads. Fats and oils Butter. Stick margarine. Lard. Shortening. Ghee. Bacon fat. Tropical oils, such as coconut, palm kernel, or palm oil. Seasoning and other foods Salted popcorn and pretzels. Onion salt, garlic salt, seasoned salt, table salt, and sea salt. Worcestershire sauce. Tartar sauce. Barbecue sauce. Teriyaki sauce. Soy sauce, including reduced-sodium. Steak sauce. Canned and packaged gravies. Fish sauce. [employer]  sauce. Cocktail sauce. Horseradish that you find on the shelf. Ketchup. Mustard. Meat flavorings and tenderizers. Bouillon cubes. Hot sauce and Tabasco sauce. Premade or packaged marinades. Premade or packaged taco seasonings. Relishes. Regular salad dressings. Where to find more information:  National Heart, Lung, and [place]: https://wilson-eaton.com/  American Heart Association: www.heart.org Summary  The DASH eating plan is a healthy eating plan that has been shown to reduce high blood pressure  (hypertension). It may also reduce your risk for type 2 diabetes, heart disease, and stroke.  With the DASH eating plan, you should limit salt (sodium) intake to 2,300 mg a day. If you have hypertension, you may need to reduce your sodium intake to 1,500 mg a day.  When on the DASH eating plan, aim to eat more fresh fruits and vegetables, whole grains, lean proteins, low-fat dairy, and heart-healthy fats.  Work with your health care provider or diet and nutrition specialist (dietitian) to adjust your eating plan to your individual calorie needs. This information is not intended to replace advice given to you by your health care provider. Make sure you discuss any questions you have with your health care provider. Document Released: 01/01/2011 Document Revised: 01/06/2016 Document Reviewed: 01/06/2016 Elsevier Interactive Patient Education  2017 [employer].   Steps to Quit Smoking Smoking tobacco can be bad for your health. It can also affect almost every organ in your body. Smoking puts you and people around you at risk for many serious long-lasting (chronic) diseases. Quitting smoking is hard, but it is one of the best things that you can do for your health. It is never too late to quit. What are the benefits of quitting smoking? When you quit smoking, you lower your risk for getting serious diseases and conditions. They can include:  Lung cancer or lung disease.  Heart disease.  Stroke.  Heart attack.  Not being able to have children (infertility).  Weak bones (osteoporosis) and broken bones (fractures).  If you have coughing, wheezing, and shortness of breath, those symptoms may get better when you quit. You may also get sick less often. If you are pregnant, quitting smoking can help to lower your chances of having a baby of low birth weight. What can I do to help me quit smoking? Talk with your doctor about what can help you quit smoking. Some things you can do (strategies)  include:  Quitting smoking totally, instead of slowly cutting back how much you smoke over a period of time.  Going to in-person counseling. You are more likely to quit if you go to many counseling sessions.  Using resources and support systems, such as: ? Database administrator with a Social worker. ? Phone quitlines. ? Careers information officer. ? Support groups or group counseling. ? Text messaging programs. ? Mobile phone apps or applications.  Taking medicines. Some of these medicines may have nicotine in them. If you are pregnant or breastfeeding, do not take any medicines to quit smoking unless your doctor says it is okay. Talk with your doctor about counseling or other things that can help you.  Talk with your doctor about using more than one strategy at the same time, such as taking medicines while you are also going to in-person counseling. This can help make quitting easier. What things can I do to make it easier to quit? Quitting smoking might feel very hard at first, but there is a lot that you can do to make it easier. Take these steps:  Talk to your  family and friends. Ask them to support and encourage you.  Call phone quitlines, reach out to support groups, or work with a Social worker.  Ask people who smoke to not smoke around you.  Avoid places that make you want (trigger) to smoke, such as: ? Bars. ? Parties. ? Smoke-break areas at work.  Spend time with people who do not smoke.  Lower the stress in your life. Stress can make you want to smoke. Try these things to help your stress: ? Getting regular exercise. ? Deep-breathing exercises. ? Yoga. ? Meditating. ? Doing a body scan. To do this, close your eyes, focus on one area of your body at a time from head to toe, and notice which parts of your body are tense. Try to relax the muscles in those areas.  Download or buy apps on your mobile phone or tablet that can help you stick to your quit plan. There are many free apps,  such as QuitGuide from the [employer] Office manager for Disease Control and Prevention). You can find more support from smokefree.gov and other websites.  This information is not intended to replace advice given to you by your health care provider. Make sure you discuss any questions you have with your health care provider. Document Released: 11/08/2008 Document Revised: 09/10/2015 Document Reviewed: 05/29/2014 Elsevier Interactive Patient Education  2018 [employer] [place], Male A healthy lifestyle and preventive care is important for your health and wellness. Ask your health care provider about what schedule of regular examinations is right for you. What should I know about weight and diet? Eat a Healthy Diet  Eat plenty of vegetables, fruits, whole grains, low-fat dairy products, and lean protein.  Do not eat a lot of foods high in solid fats, added sugars, or salt.  Maintain a Healthy Weight Regular exercise can help you achieve or maintain a healthy weight. You should:  Do at least 150 minutes of exercise each week. The exercise should increase your heart rate and make you sweat (moderate-intensity exercise).  Do strength-training exercises at least twice a week.  Watch Your Levels of Cholesterol and Blood Lipids  Have your blood tested for lipids and cholesterol every 5 years starting at 71 years of age. If you are at high risk for heart disease, you should start having your blood tested when you are 71 years old. You may need to have your cholesterol levels checked more often if: ? Your lipid or cholesterol levels are high. ? You are older than 71 years of age. ? You are at high risk for heart disease.  What should I know about cancer screening? Many types of cancers can be detected early and may often be prevented. Lung Cancer  You should be screened every year for lung cancer if: ? You are a current smoker who has smoked for at least 30 years. ? You are a former smoker  who has quit within the past 15 years.  Talk to your health care provider about your screening options, when you should start screening, and how often you should be screened.  Colorectal Cancer  Routine colorectal cancer screening usually begins at 71 years of age and should be repeated every 5-10 years until you are 71 years old. You may need to be screened more often if early forms of precancerous polyps or small growths are found. Your health care provider may recommend screening at an earlier age if you have risk factors for colon cancer.  Your health care  provider may recommend using home test kits to check for hidden blood in the stool.  A small camera at the end of a tube can be used to examine your colon (sigmoidoscopy or colonoscopy). This checks for the earliest forms of colorectal cancer.  Prostate and Testicular Cancer  Depending on your age and overall health, your health care provider may do certain tests to screen for prostate and testicular cancer.  Talk to your health care provider about any symptoms or concerns you have about testicular or prostate cancer.  Skin Cancer  Check your skin from head to toe regularly.  Tell your health care provider about any new moles or changes in moles, especially if: ? There is a change in a mole's size, shape, or color. ? You have a mole that is larger than a pencil eraser.  Always use sunscreen. Apply sunscreen liberally and repeat throughout the day.  Protect yourself by wearing long sleeves, pants, a wide-brimmed hat, and sunglasses when outside.  What should I know about heart disease, diabetes, and high blood pressure?  If you are 11-46 years of age, have your blood pressure checked every 3-5 years. If you are 66 years of age or older, have your blood pressure checked every year. You should have your blood pressure measured twice-once when you are at a hospital or clinic, and once when you are not at a hospital or clinic. Record  the average of the two measurements. To check your blood pressure when you are not at a hospital or clinic, you can use: ? An automated blood pressure machine at a pharmacy. ? A home blood pressure monitor.  Talk to your health care provider about your target blood pressure.  If you are between 35-71 years old, ask your health care provider if you should take aspirin to prevent heart disease.  Have regular diabetes screenings by checking your fasting blood sugar level. ? If you are at a normal weight and have a low risk for diabetes, have this test once every three years after the age of 31. ? If you are overweight and have a high risk for diabetes, consider being tested at a younger age or more often.  A one-time screening for abdominal aortic aneurysm (AAA) by ultrasound is recommended for men aged 71-75 years who are current or former smokers. What should I know about preventing infection? Hepatitis B If you have a higher risk for hepatitis B, you should be screened for this virus. Talk with your health care provider to find out if you are at risk for hepatitis B infection. Hepatitis C Blood testing is recommended for:  Everyone born from [birth year] through 1965.  Anyone with known risk factors for hepatitis C.  Sexually Transmitted Diseases (STDs)  You should be screened each year for STDs including gonorrhea and chlamydia if: ? You are sexually active and are younger than 71 years of age. ? You are older than 71 years of age and your health care provider tells you that you are at risk for this type of infection. ? Your sexual activity has changed since you were last screened and you are at an increased risk for chlamydia or gonorrhea. Ask your health care provider if you are at risk.  Talk with your health care provider about whether you are at high risk of being infected with HIV. Your health care provider may recommend a prescription medicine to help prevent HIV infection.  What else  can I do?  Schedule regular  health, dental, and eye exams.  Stay current with your vaccines (immunizations).  Do not use any tobacco products, such as cigarettes, chewing tobacco, and e-cigarettes. If you need help quitting, ask your health care provider.  Limit alcohol intake to no more than 2 drinks per day. One drink equals 12 ounces of beer, 5 ounces of wine, or 1 ounces of hard liquor.  Do not use street drugs.  Do not share needles.  Ask your health care provider for help if you need support or information about quitting drugs.  Tell your health care provider if you often feel depressed.  Tell your health care provider if you have ever been abused or do not feel safe at home. This information is not intended to replace advice given to you by your health care provider. Make sure you discuss any questions you have with your health care provider. Document Released: 07/11/2007 Document Revised: 09/11/2015 Document Reviewed: 10/16/2014 Elsevier Interactive Patient Education  [employer].

## 2016-09-11 ENCOUNTER — Other Ambulatory Visit (INDEPENDENT_AMBULATORY_CARE_PROVIDER_SITE_OTHER): Payer: Medicare HMO

## 2016-09-11 DIAGNOSIS — R7309 Other abnormal glucose: Secondary | ICD-10-CM | POA: Diagnosis not present

## 2016-09-11 LAB — HEMOGLOBIN A1C: Hgb A1c MFr Bld: 11.9 % — ABNORMAL HIGH (ref 4.6–6.5)

## 2016-09-11 LAB — HEPATITIS C ANTIBODY: HCV AB: NONREACTIVE

## 2016-09-16 DIAGNOSIS — Z0000 Encounter for general adult medical examination without abnormal findings: Secondary | ICD-10-CM | POA: Insufficient documentation

## 2016-09-16 DIAGNOSIS — Z125 Encounter for screening for malignant neoplasm of prostate: Secondary | ICD-10-CM | POA: Insufficient documentation

## 2016-09-16 DIAGNOSIS — Z1159 Encounter for screening for other viral diseases: Secondary | ICD-10-CM | POA: Insufficient documentation

## 2016-09-16 NOTE — Assessment & Plan Note (Signed)
Still elevated. Asymptomatic. Will start losartan 50. Continue DASH diet. Close follow-up scheduled. Labs today.

## 2016-09-16 NOTE — Assessment & Plan Note (Signed)
Depression screen negative. Health Maintenance reviewed -- Scheduled for lung cancer screen. Pneumonia vaccine given today. Has been given advanced directives to fill out. Cologuard ordered.. Preventive schedule discussed and handout given in AVS. Will obtain fasting labs today.

## 2016-09-16 NOTE — Assessment & Plan Note (Signed)
The natural history of prostate cancer and ongoing controversy regarding screening and potential treatment outcomes of prostate cancer has been discussed with the patient. The meaning of a false positive PSA and a false negative PSA has been discussed. He indicates understanding of the limitations of this screening test and wishes  to proceed with screening PSA testing.  

## 2016-09-16 NOTE — Assessment & Plan Note (Signed)
Performed by RN. RN note reviewed.

## 2016-09-16 NOTE — Assessment & Plan Note (Signed)
Discussed need for cessation. Is agreeable to starting Chantix. Will also refer for lung cancer screening giving current smoking and pack-year history.

## 2016-09-17 ENCOUNTER — Other Ambulatory Visit: Payer: Self-pay | Admitting: Acute Care

## 2016-09-17 DIAGNOSIS — Z122 Encounter for screening for malignant neoplasm of respiratory organs: Secondary | ICD-10-CM

## 2016-09-17 DIAGNOSIS — F17210 Nicotine dependence, cigarettes, uncomplicated: Secondary | ICD-10-CM

## 2016-09-18 ENCOUNTER — Encounter: Payer: Self-pay | Admitting: Physician Assistant

## 2016-09-18 ENCOUNTER — Ambulatory Visit (INDEPENDENT_AMBULATORY_CARE_PROVIDER_SITE_OTHER): Payer: Medicare HMO | Admitting: Physician Assistant

## 2016-09-18 VITALS — BP 132/80 | HR 77 | Temp 98.1°F | Resp 14 | Ht 71.0 in | Wt 200.0 lb

## 2016-09-18 DIAGNOSIS — Z720 Tobacco use: Secondary | ICD-10-CM | POA: Diagnosis not present

## 2016-09-18 DIAGNOSIS — E119 Type 2 diabetes mellitus without complications: Secondary | ICD-10-CM | POA: Insufficient documentation

## 2016-09-18 DIAGNOSIS — E1165 Type 2 diabetes mellitus with hyperglycemia: Secondary | ICD-10-CM

## 2016-09-18 DIAGNOSIS — E1142 Type 2 diabetes mellitus with diabetic polyneuropathy: Secondary | ICD-10-CM

## 2016-09-18 DIAGNOSIS — I10 Essential (primary) hypertension: Secondary | ICD-10-CM

## 2016-09-18 DIAGNOSIS — IMO0002 Reserved for concepts with insufficient information to code with codable children: Secondary | ICD-10-CM | POA: Insufficient documentation

## 2016-09-18 LAB — BASIC METABOLIC PANEL
BUN: 18 mg/dL (ref 6–23)
CALCIUM: 9.2 mg/dL (ref 8.4–10.5)
CO2: 29 mEq/L (ref 19–32)
Chloride: 103 mEq/L (ref 96–112)
Creatinine, Ser: 1.15 mg/dL (ref 0.40–1.50)
GFR: 66.68 mL/min (ref >60.00)
GLUCOSE: 259 mg/dL — AB (ref 70–99)
POTASSIUM: 5 meq/L (ref 3.5–5.1)
SODIUM: 137 meq/L (ref 135–145)

## 2016-09-18 LAB — MICROALBUMIN / CREATININE URINE RATIO
CREATININE, U: 199.4 mg/dL
MICROALB UR: 44.2 mg/dL — AB (ref 0.0–1.9)
Microalb Creat Ratio: 22.2 mg/g (ref 0.0–30.0)

## 2016-09-18 MED ORDER — CLOTRIMAZOLE 1 % EX CREA: 1.0000 "application " | TOPICAL_CREAM | Freq: Two times a day (BID) | CUTANEOUS | 0 refills | Status: DC

## 2016-09-18 MED ORDER — METFORMIN HCL 500 MG PO TABS: ORAL_TABLET | ORAL | 3 refills | Status: DC

## 2016-09-18 NOTE — Assessment & Plan Note (Signed)
Markedly elevated today.  He is in acute discomfort but will need close follow-up and establish with PCP.  Patient lab work obtained today.

## 2016-09-18 NOTE — Assessment & Plan Note (Signed)
Patient endorses insurance denied Chantix. Will initiate PA today.

## 2016-09-18 NOTE — Assessment & Plan Note (Signed)
Abuse counseling provided today

## 2016-09-18 NOTE — Progress Notes (Signed)
History of Present Illness: Patient is a 71 y.o. male who presents to clinic today to discuss new diagnosis of DM Type II. Patient seen recently for CPE at which time A1C was obtained and noted to be significantly elevated at 11.9. Patient brought in for diabetic education, medication management and to update Health Maintenance parameters for a diabetic. Patient with recent diagnosis and treatment for hypertension. Is currently on Losartan 50 mg daily. Is taking as directed. Patient denies chest pain, palpitations, lightheadedness, dizziness, vision changes or frequent headaches. LDL calculated at 114 at last check. Giving new diagnosis of diabetes, LDL goal is < 70.   Latest Maintenance: A1C --  Lab Results  Component Value Date   HGBA1C 11.9 (H) 09/11/2016   Diabetic Eye Exam -- New diagnosis. Needs referral to Ophthalmology. Agrees to referral. Urine Microalbumin -- Patient currently on ARB -- just started due to HTN. Will get microalbumin today.  Foot Exam -- Due. Notes athlete's foot symptoms..  Past Medical History:  Diagnosis Date  . Arthritis   . Closed fracture dislocation of right elbow    30 years ago -- fell out of theback of a truck  . Hypertension    Diagnosed 10 years ago. No on medication. Denies history of CAD.     Current Outpatient Prescriptions on File Prior to Visit  Medication Sig Dispense Refill  . acetaminophen (TYLENOL) 500 MG tablet Take 500-1,000 mg by mouth every 6 (six) hours as needed (for pain.).    Marland Kitchen losartan (COZAAR) 50 MG tablet Take 1 tablet (50 mg total) by mouth daily. 90 tablet 3  . naproxen sodium (ANAPROX) 220 MG tablet Take 220 mg by mouth daily as needed (for pain.).    Marland Kitchen varenicline (CHANTIX PAK) 0.5 MG X 11 & 1 MG X 42 tablet Take one 0.5 mg tablet by mouth once daily for 3 days, then increase to one 0.5 mg tablet twice daily for 4 days, then increase to one 1 mg tablet twice daily. (Patient not taking: Reported on 09/18/2016) 53 tablet 0    No current facility-administered medications on file prior to visit.     Allergies  Allergen Reactions  . Antibacterial Hand Soap [Triclosan]     Water blisters  . No Known Allergies     Family History  Problem Relation Age of Onset  . Hypertension Mother   . Dementia Mother        34s  . Transient ischemic attack Mother        42s  . Kidney disease Father   . COPD Father   . Hypertension Brother   . Alcohol abuse Maternal Grandfather   . Mental illness Paternal Grandmother   . Hypertension Daughter   . Healthy Son   . Learning disabilities Son   . Lung cancer Maternal Aunt     Social History   Social History  . Marital status: Married    Spouse name: N/A  . Number of children: 3  . Years of education: N/A   Occupational History  . Retired     Dealer   Social History Main Topics  . Smoking status: Current Every Day Smoker    Packs/day: 1.00    Years: 50.00    Types: Cigarettes    Start date: 08/12/1966  . Smokeless tobacco: Never Used  . Alcohol use No  . Drug use: No  . Sexual activity: Yes   Other Topics Concern  . None   Social History Narrative  .  None   Review of Systems: Pertinent ROS are listed in HPI  Physical Examination: BP 132/80   Pulse 77   Temp 98.1 F (36.7 C) (Oral)   Resp 14   Ht 5\' 11"  (1.803 m)   Wt 200 lb (90.7 kg)   SpO2 96%   BMI 27.89 kg/m  General appearance: alert, cooperative, appears stated age and no distress Lungs: clear to auscultation bilaterally Heart: regular rate and rhythm, S1, S2 normal, no murmur, click, rub or gallop Abdomen: soft, non-tender; bowel sounds normal; no masses,  no organomegaly Extremities: Hyperpigmentation of skin of lower extremities noted bilaterally. No abnormal pallor or coolness/warmth noted. Pulses 2+ equal. No sign of edema on examination. There is dryness and scaling of skin of feet bilaterally with maceration noted between toes.  Pulses: 2+ and symmetric Skin: Skin color,  texture, turgor normal. No rashes or lesions Lymph nodes: Cervical, supraclavicular, and axillary nodes normal. Neurologic: Alert and oriented X 3, normal strength and tone. Normal symmetric reflexes. Normal coordination and gait  Diabetic Foot Exam - Simple   Simple Foot Form Diabetic Foot exam was performed with the following findings:  Yes 09/18/2016  1:00 PM  Visual Inspection See comments:  Yes Sensation Testing Intact to touch and monofilament testing bilaterally:  Yes Pulse Check Posterior Tibialis and Dorsalis pulse intact bilaterally:  Yes Comments See Skin examination -- evidence of tinea pedis and onychomycosis. Dystrophic nails noted. Referral to Podiatry has been placed.     Assessment/Plan: Tobacco abuse disorder Patient endorses insurance denied Chantix. Will initiate PA today.   Essential hypertension BP much improved from last check. Continue current regimen. Follow-up 2 weeks. Repeat labs today.  Diabetes mellitus type II, uncontrolled ([place]) Foot exam today reveals tinea pedis and onychomycosis. Referral to podiatry placed. Start Lotrisone.  BP improving. On ARB. Will check urine microalbumin and BMP today.  Referral to opthalmology placed. Patient given Prevnar last visit.  Diabetic Educations given. Glucometer provided along with meal planning guides. Discussed short-term and long-term goals.  Will start Metformin 500 mg once daily x 1 week. Then increase to 500 mg BID.   Follow-up 2 weeks.  Will discuss addition of statin at follow-up in 2 weeks.

## 2016-09-18 NOTE — Patient Instructions (Signed)
Please go to the lab for blood work. I will call you with your results.  Keep feet clean and dry. Use the cream as directed. You will be contacted by Podiatry for diabetic foot care.  Please continue losartan as directed. Your BP levels are improving.   Start the Metformin 500 mg tablet once daily for 1 week. Then increase to 1 tablet twice daily.  Check sugar levels once per day, alternating times as noted in the log I have given you. Record these.  You will be contacted for assessment by a diabetic eye examination.   Follow-up in 2 weeks.

## 2016-09-18 NOTE — Assessment & Plan Note (Signed)
Presentation is consistent with a recurrent olecranon bursitis.  Given the findings however on MSK ultrasound revealed a solid structure with significant vascular structures within this past further evaluation with MRI with and without contrast is warranted.  Likely referral to orthopedics.  We will plan to touch base with him by telephone to discuss the results of the MRI

## 2016-09-18 NOTE — Progress Notes (Signed)
Pre visit review using our clinic review tool, if applicable. No additional management support is needed unless otherwise documented below in the visit note. 

## 2016-09-18 NOTE — Assessment & Plan Note (Signed)
BP much improved from last check. Continue current regimen. Follow-up 2 weeks. Repeat labs today.

## 2016-09-18 NOTE — Assessment & Plan Note (Addendum)
Foot exam today reveals tinea pedis and onychomycosis. Referral to podiatry placed. Start Lotrisone.  BP improving. On ARB. Will check urine microalbumin and BMP today.  Referral to opthalmology placed. Patient given Prevnar last visit.  Diabetic Educations given. Glucometer provided along with meal planning guides. Discussed short-term and long-term goals.  Will start Metformin 500 mg once daily x 1 week. Then increase to 500 mg BID.   Follow-up 2 weeks.  Will discuss addition of statin at follow-up in 2 weeks.

## 2016-09-18 NOTE — Procedures (Signed)
LIMITED MSK ULTRASOUND OF Right Elbow Images were obtained and interpreted by myself, Teresa Coombs, DO  Images have been saved and stored to PACS system. Images obtained on: GE S7 Ultrasound machine  FINDINGS:   Marked abnormality in the posterior elbow visualized in the area consistent with olecranon bursa.  This structure is solid-appearing with marked Hypervascular structures within the solid lesion.  There is only a small amount of hypoechoic change deep to the mass.  IMPRESSION:  1. Left elbow mass of unclear etiology.  Differential includes chronically infected bursa, gouty tophi, neoplasm.  Further evaluation with MR indicated

## 2016-09-21 ENCOUNTER — Telehealth: Payer: Self-pay | Admitting: Physician Assistant

## 2016-09-21 ENCOUNTER — Other Ambulatory Visit: Payer: Self-pay | Admitting: Emergency Medicine

## 2016-09-21 DIAGNOSIS — E1165 Type 2 diabetes mellitus with hyperglycemia: Principal | ICD-10-CM

## 2016-09-21 DIAGNOSIS — IMO0002 Reserved for concepts with insufficient information to code with codable children: Secondary | ICD-10-CM

## 2016-09-21 DIAGNOSIS — E1142 Type 2 diabetes mellitus with diabetic polyneuropathy: Secondary | ICD-10-CM

## 2016-09-21 MED ORDER — GLUCOSE BLOOD VI STRP: ORAL_STRIP | 12 refills | Status: DC

## 2016-09-21 MED ORDER — ONETOUCH ULTRASOFT LANCETS MISC
12 refills | Status: DC
Start: 2016-09-21 — End: 2016-11-06

## 2016-09-21 NOTE — Progress Notes (Signed)
rx for Strips and lancets were sent to the [facility].

## 2016-09-21 NOTE — Telephone Encounter (Signed)
Sent refill of the Test strips and lancets to the [facility].

## 2016-09-21 NOTE — Telephone Encounter (Signed)
Pt states that while trying to figure how to use the One Touch Verio Flex that they use a lot of lancets and test strips and asking if small refill could be called into [facility] on [address].

## 2016-09-22 DIAGNOSIS — R69 Illness, unspecified: Secondary | ICD-10-CM | POA: Diagnosis not present

## 2016-09-23 ENCOUNTER — Ambulatory Visit (INDEPENDENT_AMBULATORY_CARE_PROVIDER_SITE_OTHER): Payer: Medicare HMO | Admitting: Orthopedic Surgery

## 2016-09-24 ENCOUNTER — Ambulatory Visit: Payer: Medicare HMO | Admitting: Family Medicine

## 2016-09-30 ENCOUNTER — Ambulatory Visit (INDEPENDENT_AMBULATORY_CARE_PROVIDER_SITE_OTHER): Payer: Medicare HMO | Admitting: Orthopedic Surgery

## 2016-09-30 ENCOUNTER — Ambulatory Visit (INDEPENDENT_AMBULATORY_CARE_PROVIDER_SITE_OTHER)
Admission: RE | Admit: 2016-09-30 | Discharge: 2016-09-30 | Disposition: A | Discharge status: Home / Self Care | Payer: Medicare HMO | Source: Ambulatory Visit | Attending: Acute Care | Admitting: Acute Care

## 2016-09-30 ENCOUNTER — Ambulatory Visit (INDEPENDENT_AMBULATORY_CARE_PROVIDER_SITE_OTHER): Payer: Medicare HMO | Admitting: Acute Care

## 2016-09-30 ENCOUNTER — Encounter: Payer: Self-pay | Admitting: Acute Care

## 2016-09-30 DIAGNOSIS — R69 Illness, unspecified: Secondary | ICD-10-CM | POA: Diagnosis not present

## 2016-09-30 DIAGNOSIS — F17210 Nicotine dependence, cigarettes, uncomplicated: Secondary | ICD-10-CM

## 2016-09-30 DIAGNOSIS — Z122 Encounter for screening for malignant neoplasm of respiratory organs: Secondary | ICD-10-CM

## 2016-09-30 NOTE — Progress Notes (Signed)
Shared Decision Making Visit Lung Cancer Screening Program [phone])   Eligibility:  Age 71 y.o.  Pack Years Smoking History Calculation 50 pack year smoking history (# packs/per year x # years smoked)  Recent History of coughing up blood  no  Unexplained weight loss? no ( >Than 15 pounds within the last 6 months )  Prior History Lung / other cancer no (Diagnosis within the last 5 years already requiring surveillance chest CT Scans).  Smoking Status Current Smoker  Former Smokers: Years since quit: NA  Quit Date: NA  Visit Components:  Discussion included one or more decision making aids. yes  Discussion included risk/benefits of screening. yes  Discussion included potential follow up diagnostic testing for abnormal scans. yes  Discussion included meaning and risk of over diagnosis. yes  Discussion included meaning and risk of False Positives. yes  Discussion included meaning of total radiation exposure. yes  Counseling Included:  Importance of adherence to annual lung cancer LDCT screening. yes  Impact of comorbidities on ability to participate in the program. yes  Ability and willingness to under diagnostic treatment. yes  Smoking Cessation Counseling:  Current Smokers:   Discussed importance of smoking cessation. yes  Information about tobacco cessation classes and interventions provided to patient. yes  Patient provided with "ticket" for LDCT Scan. yes  Symptomatic Patient. no  Counseling: NA  Diagnosis Code: Tobacco Use Z72.0  Asymptomatic Patient yes  Counseling (Intermediate counseling: > three minutes counseling) Q2229  Former Smokers:   Discussed the importance of maintaining cigarette abstinence. yes  Diagnosis Code: Personal History of Nicotine Dependence. N98.921  Information about tobacco cessation classes and interventions provided to patient. Yes  Patient provided with "ticket" for LDCT Scan. yes  Written Order for Lung Cancer  Screening with LDCT placed in Epic. Yes (CT Chest Lung Cancer Screening Low Dose W/O CM) [id number] Z12.2-Screening of respiratory organs Z87.891-Personal history of nicotine dependence  I have spent 25 minutes of face to face time with [name] discussing the risks and benefits of lung cancer screening. We viewed a power point together that explained in detail the above noted topics. We paused at intervals to allow for questions to be asked and answered to ensure understanding.We discussed that the single most powerful action that he can take to decrease his risk of developing lung cancer is to quit smoking. We discussed whether or not he is ready to commit to setting a quit date. He is currently not ready to set a quit date. We discussed options for tools to aid in quitting smoking including nicotine replacement therapy, non-nicotine medications, support groups, Quit Smart classes, and behavior modification. We discussed that often times setting smaller, more achievable goals, such as eliminating 1 cigarette a day for a week and then 2 cigarettes a day for a week can be helpful in slowly decreasing the number of cigarettes smoked. This allows for a sense of accomplishment as well as providing a clinical benefit. I gave [name] the " Be Stronger Than Your Excuses" card with contact information for community resources, classes, free nicotine replacement therapy, and access to mobile apps, text messaging, and on-line smoking cessation help. I have also given him my card and contact information in the event he needs to contact me. We discussed the time and location of the scan, and that either Doroteo Glassman RN or I will call with the results within 24-48 hours of receiving them. I have offered [name]  a copy of the power point  we viewed  as a resource in the event they need reinforcement of the concepts we discussed today in the office. The patient verbalized understanding of all of  the above and had no  further questions upon leaving the office. They have my contact information in the event they have any further questions.  I spent 4-5  minutes counseling on smoking cessation and the health risks of continued tobacco abuse.  I explained to the patient that there has been a high incidence of coronary artery disease noted on these exams. I explained that this is a non-gated exam therefore degree or severity cannot be determined. This patient is not on statin therapy. I have asked the patient to follow-up with their PCP regarding any incidental finding of coronary artery disease and management with diet or medication as their PCP  feels is clinically indicated. The patient verbalized understanding of the above and had no further questions upon completion of the visit.      Magdalen Spatz, NP 09/30/2016

## 2016-10-02 ENCOUNTER — Ambulatory Visit: Payer: Medicare HMO | Admitting: Physician Assistant

## 2016-10-02 ENCOUNTER — Encounter: Payer: Self-pay | Admitting: Physician Assistant

## 2016-10-02 ENCOUNTER — Other Ambulatory Visit: Payer: Self-pay | Admitting: Acute Care

## 2016-10-02 ENCOUNTER — Ambulatory Visit (INDEPENDENT_AMBULATORY_CARE_PROVIDER_SITE_OTHER): Payer: Medicare HMO | Admitting: Physician Assistant

## 2016-10-02 VITALS — BP 120/84 | HR 75 | Temp 98.4°F | Resp 14 | Ht 71.0 in | Wt 196.0 lb

## 2016-10-02 DIAGNOSIS — E1165 Type 2 diabetes mellitus with hyperglycemia: Secondary | ICD-10-CM | POA: Diagnosis not present

## 2016-10-02 DIAGNOSIS — F17210 Nicotine dependence, cigarettes, uncomplicated: Secondary | ICD-10-CM

## 2016-10-02 DIAGNOSIS — Z122 Encounter for screening for malignant neoplasm of respiratory organs: Secondary | ICD-10-CM

## 2016-10-02 DIAGNOSIS — Z720 Tobacco use: Secondary | ICD-10-CM | POA: Diagnosis not present

## 2016-10-02 DIAGNOSIS — E785 Hyperlipidemia, unspecified: Secondary | ICD-10-CM

## 2016-10-02 DIAGNOSIS — I10 Essential (primary) hypertension: Secondary | ICD-10-CM | POA: Diagnosis not present

## 2016-10-02 DIAGNOSIS — E1169 Type 2 diabetes mellitus with other specified complication: Secondary | ICD-10-CM

## 2016-10-02 DIAGNOSIS — IMO0002 Reserved for concepts with insufficient information to code with codable children: Secondary | ICD-10-CM

## 2016-10-02 DIAGNOSIS — E1142 Type 2 diabetes mellitus with diabetic polyneuropathy: Secondary | ICD-10-CM

## 2016-10-02 LAB — BASIC METABOLIC PANEL
BUN: 19 mg/dL (ref 6–23)
CHLORIDE: 103 meq/L (ref 96–112)
CO2: 26 meq/L (ref 19–32)
Calcium: 9.7 mg/dL (ref 8.4–10.5)
Creatinine, Ser: 1.17 mg/dL (ref 0.40–1.50)
GFR: 65.36 mL/min (ref >60.00)
GLUCOSE: 171 mg/dL — AB (ref 70–99)
POTASSIUM: 4.8 meq/L (ref 3.5–5.1)
SODIUM: 138 meq/L (ref 135–145)

## 2016-10-02 MED ORDER — PRAVASTATIN SODIUM 20 MG PO TABS: 20.0000 mg | ORAL_TABLET | Freq: Every day | ORAL | 3 refills | Status: DC

## 2016-10-02 NOTE — Patient Instructions (Signed)
Please go to the lab for blood work.  I will call with results.  Continue medications as directed for now, starting the Pravachol once daily for cholesterol. I also want you to start a baby aspirin (81 mg) daily.   Follow-up in 4 weeks for reassessment.  Return sooner if needed.    Diabetes Mellitus and Exercise Exercising regularly is important for your overall health, especially when you have diabetes (diabetes mellitus). Exercising is not only about losing weight. It has many health benefits, such as increasing muscle strength and bone density and reducing body fat and stress. This leads to improved fitness, flexibility, and endurance, all of which result in better overall health. Exercise has additional benefits for people with diabetes, including:  Reducing appetite.  Helping to lower and control blood glucose.  Lowering blood pressure.  Helping to control amounts of fatty substances (lipids) in the blood, such as cholesterol and triglycerides.  Helping the body to respond better to insulin (improving insulin sensitivity).  Reducing how much insulin the body needs.  Decreasing the risk for heart disease by: ? Lowering cholesterol and triglyceride levels. ? Increasing the levels of good cholesterol. ? Lowering blood glucose levels.  What is my activity plan? Your health care provider or certified diabetes educator can help you make a plan for the type and frequency of exercise (activity plan) that works for you. Make sure that you:  Do at least 150 minutes of moderate-intensity or vigorous-intensity exercise each week. This could be brisk walking, biking, or water aerobics. ? Do stretching and strength exercises, such as yoga or weightlifting, at least 2 times a week. ? Spread out your activity over at least 3 days of the week.  Get some form of physical activity every day. ? Do not go more than 2 days in a row without some kind of physical activity. ? Avoid being inactive  for more than 90 minutes at a time. Take frequent breaks to walk or stretch.  Choose a type of exercise or activity that you enjoy, and set realistic goals.  Start slowly, and gradually increase the intensity of your exercise over time.  What do I need to know about managing my diabetes?  Check your blood glucose before and after exercising. ? If your blood glucose is higher than 240 mg/dL (13.3 mmol/L) before you exercise, check your urine for ketones. If you have ketones in your urine, do not exercise until your blood glucose returns to normal.  Know the symptoms of low blood glucose (hypoglycemia) and how to treat it. Your risk for hypoglycemia increases during and after exercise. Common symptoms of hypoglycemia can include: ? Hunger. ? Anxiety. ? Sweating and feeling clammy. ? Confusion. ? Dizziness or feeling light-headed. ? Increased heart rate or palpitations. ? Blurry vision. ? Tingling or numbness around the mouth, lips, or tongue. ? Tremors or shakes. ? Irritability.  Keep a rapid-acting carbohydrate snack available before, during, and after exercise to help prevent or treat hypoglycemia.  Avoid injecting insulin into areas of the body that are going to be exercised. For example, avoid injecting insulin into: ? The arms, when playing tennis. ? The legs, when jogging.  Keep records of your exercise habits. Doing this can help you and your health care provider adjust your diabetes management plan as needed. Write down: ? Food that you eat before and after you exercise. ? Blood glucose levels before and after you exercise. ? The type and amount of exercise you have done. ?  When your insulin is expected to peak, if you use insulin. Avoid exercising at times when your insulin is peaking.  When you start a new exercise or activity, work with your health care provider to make sure the activity is safe for you, and to adjust your insulin, medicines, or food intake as  needed.  Drink plenty of water while you exercise to prevent dehydration or heat stroke. Drink enough fluid to keep your urine clear or pale yellow. This information is not intended to replace advice given to you by your health care provider. Make sure you discuss any questions you have with your health care provider. Document Released: 04/04/2003 Document Revised: 08/02/2015 Document Reviewed: 06/24/2015 Elsevier Interactive Patient Education  2018 [employer].

## 2016-10-02 NOTE — Assessment & Plan Note (Signed)
Will start Pravachol and 81 mg ASA daily. Dietary and exercise regimen reviewed. Follow-up in 4 weeks for repeat LFTS and fasting lipid panel.

## 2016-10-02 NOTE — Progress Notes (Signed)
Patient presents to clinic today for follow-up of hypertension, DM and to discuss treatment for hyperlipidemia.    HTN - Currently on a regimen of losartan 25 mg daily. Is taking medications as directed without side effect. Patient denies chest pain, palpitations, lightheadedness, dizziness, vision changes or frequent headaches.  BP Readings from Last 3 Encounters:  10/02/16 120/84  09/18/16 132/80  09/10/16 (!) 158/88   DM --  Was started on Metformin 500 mg QD. Is now up to twice daily metformin. Tolerating well. Is checking sugars as directed. Fasting sugars are averaging 150-200 currently. Non-fasting glucose is averaging in 120-170s. Is still awaiting appointment with Ophthalmology.   Hyperlipidemia -- LDL at 114 on recent labs.  Giving DM goal is < 70. Will discuss further recommendations.     Past Medical History:  Diagnosis Date  . Arthritis   . Closed fracture dislocation of right elbow    30 years ago -- fell out of theback of a truck  . Hypertension    Diagnosed 10 years ago. No on medication. Denies history of CAD.     Current Outpatient Prescriptions on File Prior to Visit  Medication Sig Dispense Refill  . acetaminophen (TYLENOL) 500 MG tablet Take 500-1,000 mg by mouth every 6 (six) hours as needed (for pain.).    Marland Kitchen clotrimazole (LOTRIMIN) 1 % cream Apply 1 application topically 2 (two) times daily. 30 g 0  . glucose blood test strip Check blood sugars once daily  Dx:E11.65 100 each 12  . Lancets (ONETOUCH ULTRASOFT) lancets Check blood sugars daily. Dx: E11.65 100 each 12  . losartan (COZAAR) 50 MG tablet Take 1 tablet (50 mg total) by mouth daily. 90 tablet 3  . metFORMIN (GLUCOPHAGE) 500 MG tablet Take 1 tablet each morning x 1 week. Then increase to 1 tablet twice daily. (Patient taking differently: Take 500 mg by mouth 2 (two) times daily with a meal. Take 1 tablet each morning x 1 week. Then increase to 1 tablet twice daily.) 60 tablet 3  . naproxen sodium  (ANAPROX) 220 MG tablet Take 220 mg by mouth daily as needed (for pain.).    Marland Kitchen varenicline (CHANTIX PAK) 0.5 MG X 11 & 1 MG X 42 tablet Take one 0.5 mg tablet by mouth once daily for 3 days, then increase to one 0.5 mg tablet twice daily for 4 days, then increase to one 1 mg tablet twice daily. (Patient not taking: Reported on 10/02/2016) 53 tablet 0   No current facility-administered medications on file prior to visit.     Allergies  Allergen Reactions  . Antibacterial Hand Soap [Triclosan]     Water blisters  . No Known Allergies     Family History  Problem Relation Age of Onset  . Hypertension Mother   . Dementia Mother        6s  . Transient ischemic attack Mother        61s  . Kidney disease Father   . COPD Father   . Hypertension Brother   . Alcohol abuse Maternal Grandfather   . Mental illness Paternal Grandmother   . Hypertension Daughter   . Healthy Son   . Learning disabilities Son   . Lung cancer Maternal Aunt     Social History   Social History  . Marital status: Married    Spouse name: N/A  . Number of children: 3  . Years of education: N/A   Occupational History  . Retired  Mechanic   Social History Main Topics  . Smoking status: Current Every Day Smoker    Packs/day: 1.00    Years: 50.00    Types: Cigarettes    Start date: 08/12/1966  . Smokeless tobacco: Never Used  . Alcohol use No  . Drug use: No  . Sexual activity: Yes   Other Topics Concern  . None   Social History Narrative  . None   Review of Systems - See HPI.  All other ROS are negative.  BP 120/84   Pulse 75   Temp 98.4 F (36.9 C) (Oral)   Resp 14   Ht '5\' 11"'  (1.803 m)   Wt 196 lb (88.9 kg)   SpO2 98%   BMI 27.34 kg/m   Physical Exam  Constitutional: He is oriented to person, place, and time and well-developed, well-nourished, and in no distress.  HENT:  Head: Normocephalic and atraumatic.  Eyes: Conjunctivae are normal.  Cardiovascular: Normal rate, regular  rhythm, normal heart sounds and intact distal pulses.   Pulmonary/Chest: Effort normal and breath sounds normal. No respiratory distress. He has no wheezes. He has no rales. He exhibits no tenderness.  Neurological: He is alert and oriented to person, place, and time.  Skin: Skin is warm and dry. No rash noted.  Psychiatric: Affect normal.  Vitals reviewed.  Recent Results (from the past 2160 hour(s))  I-STAT creatinine     Status: Abnormal   Collection Time: 08/12/16  9:20 PM  Result Value Ref Range   Creatinine, Ser 1.30 (H) 0.61 - 1.24 mg/dL  Basic metabolic panel     Status: Abnormal   Collection Time: 08/18/16  6:24 AM  Result Value Ref Range   Sodium 135 135 - 145 mmol/L   Potassium 3.8 3.5 - 5.1 mmol/L   Chloride 103 101 - 111 mmol/L   CO2 23 22 - 32 mmol/L   Glucose, Bld 243 (H) 65 - 99 mg/dL   BUN 17 6 - 20 mg/dL   Creatinine, Ser 1.06 0.61 - 1.24 mg/dL   Calcium 8.9 8.9 - 10.3 mg/dL   GFR calc non Af Amer >60 >60 mL/min   GFR calc Af Amer >60 >60 mL/min    Comment: (NOTE) The eGFR has been calculated using the CKD EPI equation. This calculation has not been validated in all clinical situations. eGFR's persistently <60 mL/min signify possible Chronic Kidney Disease.    Anion gap 9 5 - 15  CBC     Status: None   Collection Time: 08/18/16  6:24 AM  Result Value Ref Range   WBC 6.1 4.0 - 10.5 K/uL   RBC 4.75 4.22 - 5.81 MIL/uL   Hemoglobin 14.7 13.0 - 17.0 g/dL   HCT 42.8 39.0 - 52.0 %   MCV 90.1 78.0 - 100.0 fL   MCH 30.9 26.0 - 34.0 pg   MCHC 34.3 30.0 - 36.0 g/dL   RDW 12.8 11.5 - 15.5 %   Platelets 252 150 - 400 K/uL  Aerobic/Anaerobic Culture (surgical/deep wound)     Status: None   Collection Time: 08/18/16  8:31 AM  Result Value Ref Range   Specimen Description WOUND    Special Requests CHRONIC OLECRANON BURSA FLUID POF ANCEF    Gram Stain      FEW WBC PRESENT, PREDOMINANTLY PMN NO ORGANISMS SEEN    Culture FEW STAPHYLOCOCCUS AUREUS NO ANAEROBES  ISOLATED     Report Status 08/23/2016 FINAL    Organism ID, Bacteria STAPHYLOCOCCUS AUREUS  Susceptibility   Staphylococcus aureus - MIC*    CIPROFLOXACIN <=0.5 SENSITIVE Sensitive     ERYTHROMYCIN <=0.25 SENSITIVE Sensitive     GENTAMICIN <=0.5 SENSITIVE Sensitive     OXACILLIN <=0.25 SENSITIVE Sensitive     TETRACYCLINE <=1 SENSITIVE Sensitive     VANCOMYCIN 1 SENSITIVE Sensitive     TRIMETH/SULFA <=10 SENSITIVE Sensitive     CLINDAMYCIN <=0.25 SENSITIVE Sensitive     RIFAMPIN <=0.5 SENSITIVE Sensitive     Inducible Clindamycin NEGATIVE Sensitive     * FEW STAPHYLOCOCCUS AUREUS  Hepatitis C Antibody     Status: None   Collection Time: 09/10/16  9:50 AM  Result Value Ref Range   HCV Ab NON-REACTIVE NON-REACTIVE    Comment:                                                                        This test is for screening purposes only.  Reactive results should be confirmed by an alternative method.  Suggest HCV Qualitative, PCR, test code 83130.  Specimens will be stable for reflex testing up to 3 days after collection.   CBC     Status: None   Collection Time: 09/10/16  9:50 AM  Result Value Ref Range   WBC 5.6 4.0 - 10.5 K/uL   RBC 5.29 4.22 - 5.81 Mil/uL   Platelets 239.0 150.0 - 400.0 K/uL   Hemoglobin 16.9 13.0 - 17.0 g/dL   HCT 50.4 39.0 - 52.0 %   MCV 95.2 78.0 - 100.0 fl   MCHC 33.5 30.0 - 36.0 g/dL   RDW 13.8 11.5 - 15.5 %  Comprehensive metabolic panel     Status: Abnormal   Collection Time: 09/10/16  9:50 AM  Result Value Ref Range   Sodium 134 (L) 135 - 145 mEq/L   Potassium 4.3 3.5 - 5.1 mEq/L   Chloride 99 96 - 112 mEq/L   CO2 28 19 - 32 mEq/L   Glucose, Bld 262 (H) 70 - 99 mg/dL   BUN 18 6 - 23 mg/dL   Creatinine, Ser 1.04 0.40 - 1.50 mg/dL   Total Bilirubin 0.5 0.2 - 1.2 mg/dL   Alkaline Phosphatase 86 39 - 117 U/L   AST 13 0 - 37 U/L   ALT 17 0 - 53 U/L   Total Protein 6.9 6.0 - 8.3 g/dL   Albumin 4.1 3.5 - 5.2 g/dL   Calcium 9.5 8.4 - 10.5  mg/dL   GFR 74.89 >60.00 mL/min  Lipid panel     Status: Abnormal   Collection Time: 09/10/16  9:50 AM  Result Value Ref Range   Cholesterol 268 (H) 0 - 200 mg/dL    Comment: ATP III Classification       Desirable:  < 200 mg/dL               Borderline High:  200 - 239 mg/dL          High:  > = 240 mg/dL   Triglycerides (H) 0.0 - 149.0 mg/dL    572.0 Triglyceride is over 400; calculations on Lipids are invalid.    Comment: Normal:  <150 mg/dLBorderline High:  150 - 199 mg/dL   HDL 32.00 (L) >39.00 mg/dL  Total CHOL/HDL Ratio 8     Comment:                Men          Women1/2 Average Risk     3.4          3.3Average Risk          5.0          4.42X Average Risk          9.6          7.13X Average Risk          15.0          11.0                      PSA     Status: None   Collection Time: 09/10/16  9:50 AM  Result Value Ref Range   PSA 0.22 0.10 - 4.00 ng/mL    Comment: Test performed using Access Hybritech PSA Assay, a parmagnetic partical, chemiluminecent immunoassay.  Urinalysis, Routine w reflex microscopic     Status: Abnormal   Collection Time: 09/10/16  9:50 AM  Result Value Ref Range   Color, Urine YELLOW Yellow;Lt. Yellow   APPearance CLEAR Clear   Specific Gravity, Urine 1.025 1.000 - 1.030   pH 6.0 5.0 - 8.0   Total Protein, Urine 100 (A) Negative   Urine Glucose >=1000 (A) Negative   Ketones, ur NEGATIVE Negative   Bilirubin Urine NEGATIVE Negative   Hgb urine dipstick NEGATIVE Negative   Urobilinogen, UA 0.2 0.0 - 1.0   Leukocytes, UA NEGATIVE Negative   Nitrite NEGATIVE Negative   WBC, UA 0-2/hpf 0-2/hpf   RBC / HPF none seen 0-2/hpf   Mucus, UA Presence of (A) None   Squamous Epithelial / LPF Rare(0-4/hpf) Rare(0-4/hpf)  LDL cholesterol, direct     Status: None   Collection Time: 09/10/16  9:50 AM  Result Value Ref Range   Direct LDL 114.0 mg/dL    Comment: Optimal:  <100 mg/dLNear or Above Optimal:  100-129 mg/dLBorderline High:  130-159 mg/dLHigh:  160-189  mg/dLVery High:  >190 mg/dL  Hemoglobin A1c     Status: Abnormal   Collection Time: 09/11/16 11:09 AM  Result Value Ref Range   Hgb A1c MFr Bld 11.9 (H) 4.6 - 6.5 %    Comment: Glycemic Control Guidelines for People with Diabetes:Non Diabetic:  <6%Goal of Therapy: <7%Additional Action Suggested:  >8%   Urine Microalbumin w/creat. ratio     Status: Abnormal   Collection Time: 09/18/16  9:35 AM  Result Value Ref Range   Microalb, Ur 44.2 (H) 0.0 - 1.9 mg/dL   Creatinine,U 199.4 mg/dL   Microalb Creat Ratio 22.2 0.0 - 30.0 mg/g  Basic metabolic panel     Status: Abnormal   Collection Time: 09/18/16  9:35 AM  Result Value Ref Range   Sodium 137 135 - 145 mEq/L   Potassium 5.0 3.5 - 5.1 mEq/L   Chloride 103 96 - 112 mEq/L   CO2 29 19 - 32 mEq/L   Glucose, Bld 259 (H) 70 - 99 mg/dL   BUN 18 6 - 23 mg/dL   Creatinine, Ser 1.15 0.40 - 1.50 mg/dL   Calcium 9.2 8.4 - 10.5 mg/dL   GFR 66.68 >60.00 mL/min    Assessment/Plan: Diabetes mellitus type II, uncontrolled (HCC) Sugars are improving. Will continue current regimen. Recheck BMP today. If renal function stable, will increase to  500 mg AM and 1000 mg PM. Continue ARB. Will get her set her up for diabetic eye examination.   Essential hypertension BP stable. Asymptomatic. Continue current medication regimen.   Tobacco abuse disorder PA in process for Chantix. Will check on status.   Hyperlipidemia associated with type 2 diabetes mellitus ([place]) Will start Pravachol and 81 mg ASA daily. Dietary and exercise regimen reviewed. Follow-up in 4 weeks for repeat LFTS and fasting lipid panel.    Leeanne Rio, PA-C

## 2016-10-02 NOTE — Assessment & Plan Note (Signed)
BP stable. Asymptomatic. Continue current medication regimen.

## 2016-10-02 NOTE — Progress Notes (Signed)
Pre visit review using our clinic review tool, if applicable. No additional management support is needed unless otherwise documented below in the visit note. 

## 2016-10-02 NOTE — Assessment & Plan Note (Signed)
Sugars are improving. Will continue current regimen. Recheck BMP today. If renal function stable, will increase to 500 mg AM and 1000 mg PM. Continue ARB. Will get her set her up for diabetic eye examination.

## 2016-10-02 NOTE — Assessment & Plan Note (Signed)
PA in process for Chantix. Will check on status.

## 2016-10-05 ENCOUNTER — Other Ambulatory Visit: Payer: Self-pay | Admitting: Emergency Medicine

## 2016-10-05 MED ORDER — METFORMIN HCL 500 MG PO TABS: 500.0000 mg | ORAL_TABLET | Freq: Two times a day (BID) | ORAL | 3 refills | Status: DC

## 2016-10-06 ENCOUNTER — Other Ambulatory Visit: Payer: Self-pay | Admitting: Emergency Medicine

## 2016-10-06 MED ORDER — METFORMIN HCL 500 MG PO TABS: ORAL_TABLET | ORAL | 1 refills | Status: DC

## 2016-10-12 ENCOUNTER — Ambulatory Visit (INDEPENDENT_AMBULATORY_CARE_PROVIDER_SITE_OTHER): Payer: Medicare HMO | Admitting: Orthopedic Surgery

## 2016-10-12 ENCOUNTER — Encounter (INDEPENDENT_AMBULATORY_CARE_PROVIDER_SITE_OTHER): Payer: Self-pay | Admitting: Orthopedic Surgery

## 2016-10-12 DIAGNOSIS — M7021 Olecranon bursitis, right elbow: Secondary | ICD-10-CM

## 2016-10-15 NOTE — Progress Notes (Signed)
   Post-Op Visit Note   Patient: John Woodward.           Date of Birth: [birth year]           MRN: [id number] Visit Date: 10/12/2016 PCP: Brunetta Jeans, PA-C   Assessment & Plan:  Chief Complaint:  Chief Complaint  Patient presents with  . Right Elbow - Routine Post Op   Visit Diagnoses:  1. Olecranon bursitis of right elbow     Plan: John Woodward is a 71 year old patient with right olecranon bursal pain status post bursectomy.  He's doing well.  On examination there is no fluid collection and he has full range of motion of the elbow.  No erythema or evidence of infection.  Plan is release with return as tolerated.  He is doing well with his surgery follow-up as needed  Follow-Up Instructions: Return if symptoms worsen or fail to improve.   Orders:  No orders of the defined types were placed in this encounter.  No orders of the defined types were placed in this encounter.   Imaging: No results found.  PMFS History: Patient Active Problem List   Diagnosis Date Noted  . Hyperlipidemia associated with type 2 diabetes mellitus ([place]) 10/02/2016  . Diabetes mellitus type II, uncontrolled ([place]) 09/18/2016  . Encounter for Medicare annual wellness exam 09/16/2016  . Special screening examination for viral disease 09/16/2016  . Annual physical exam 09/16/2016  . Prostate cancer screening 09/16/2016  . Tobacco abuse disorder 08/11/2016  . Essential hypertension 08/11/2016   Past Medical History:  Diagnosis Date  . Arthritis   . Closed fracture dislocation of right elbow    30 years ago -- fell out of theback of a truck  . Hypertension    Diagnosed 10 years ago. No on medication. Denies history of CAD.     Family History  Problem Relation Age of Onset  . Hypertension Mother   . Dementia Mother        77s  . Transient ischemic attack Mother        70s  . Kidney disease Father   . COPD Father   . Hypertension Brother   . Alcohol abuse Maternal Grandfather     . Mental illness Paternal Grandmother   . Hypertension Daughter   . Healthy Son   . Learning disabilities Son   . Lung cancer Maternal Aunt     Past Surgical History:  Procedure Laterality Date  . NO PAST SURGERIES    . OLECRANON BURSECTOMY Right 08/18/2016   Procedure: OLECRANON BURSECTOMY;  Surgeon: Meredith Pel, MD;  Location: [facility];  Service: Orthopedics;  Laterality: Right;   Social History   Occupational History  . Retired     Dealer   Social History Main Topics  . Smoking status: Current Every Day Smoker    Packs/day: 1.00    Years: 50.00    Types: Cigarettes    Start date: 08/12/1966  . Smokeless tobacco: Never Used  . Alcohol use No  . Drug use: No  . Sexual activity: Yes

## 2016-10-19 DIAGNOSIS — E119 Type 2 diabetes mellitus without complications: Secondary | ICD-10-CM | POA: Diagnosis not present

## 2016-10-19 DIAGNOSIS — H2513 Age-related nuclear cataract, bilateral: Secondary | ICD-10-CM | POA: Diagnosis not present

## 2016-10-19 DIAGNOSIS — Z7984 Long term (current) use of oral hypoglycemic drugs: Secondary | ICD-10-CM | POA: Diagnosis not present

## 2016-10-19 LAB — HM DIABETES EYE EXAM

## 2016-10-28 ENCOUNTER — Encounter: Payer: Self-pay | Admitting: Emergency Medicine

## 2016-10-30 ENCOUNTER — Encounter: Payer: Self-pay | Admitting: Physician Assistant

## 2016-10-30 ENCOUNTER — Ambulatory Visit (INDEPENDENT_AMBULATORY_CARE_PROVIDER_SITE_OTHER): Payer: Medicare HMO | Admitting: Physician Assistant

## 2016-10-30 VITALS — BP 132/82 | HR 66 | Temp 97.7°F | Resp 14 | Ht 71.0 in | Wt 193.0 lb

## 2016-10-30 DIAGNOSIS — Z23 Encounter for immunization: Secondary | ICD-10-CM

## 2016-10-30 DIAGNOSIS — IMO0002 Reserved for concepts with insufficient information to code with codable children: Secondary | ICD-10-CM

## 2016-10-30 DIAGNOSIS — E1169 Type 2 diabetes mellitus with other specified complication: Secondary | ICD-10-CM | POA: Diagnosis not present

## 2016-10-30 DIAGNOSIS — E1142 Type 2 diabetes mellitus with diabetic polyneuropathy: Secondary | ICD-10-CM

## 2016-10-30 DIAGNOSIS — I10 Essential (primary) hypertension: Secondary | ICD-10-CM | POA: Diagnosis not present

## 2016-10-30 DIAGNOSIS — E1165 Type 2 diabetes mellitus with hyperglycemia: Secondary | ICD-10-CM

## 2016-10-30 DIAGNOSIS — E785 Hyperlipidemia, unspecified: Secondary | ICD-10-CM

## 2016-10-30 LAB — COMPREHENSIVE METABOLIC PANEL WITH GFR
ALT: 15 U/L (ref 0–53)
AST: 11 U/L (ref 0–37)
Albumin: 4.1 g/dL (ref 3.5–5.2)
Alkaline Phosphatase: 67 U/L (ref 39–117)
BUN: 20 mg/dL (ref 6–23)
CO2: 29 meq/L (ref 19–32)
Calcium: 9.6 mg/dL (ref 8.4–10.5)
Chloride: 103 meq/L (ref 96–112)
Creatinine, Ser: 1.09 mg/dL (ref 0.40–1.50)
GFR: 70.91 mL/min
Glucose, Bld: 145 mg/dL — ABNORMAL HIGH (ref 70–99)
Potassium: 4.9 meq/L (ref 3.5–5.1)
Sodium: 137 meq/L (ref 135–145)
Total Bilirubin: 0.4 mg/dL (ref 0.2–1.2)
Total Protein: 6.9 g/dL (ref 6.0–8.3)

## 2016-10-30 LAB — LIPID PANEL
Cholesterol: 181 mg/dL (ref 0–200)
HDL: 33.9 mg/dL — AB (ref >39.00)
NONHDL: 146.77
Total CHOL/HDL Ratio: 5
Triglycerides: 214 mg/dL — ABNORMAL HIGH (ref 0.0–149.0)
VLDL: 42.8 mg/dL — ABNORMAL HIGH (ref 0.0–40.0)

## 2016-10-30 LAB — LDL CHOLESTEROL, DIRECT: Direct LDL: 111 mg/dL

## 2016-10-30 MED ORDER — GLUCOSE BLOOD VI STRP: ORAL_STRIP | 12 refills | Status: DC

## 2016-10-30 NOTE — Assessment & Plan Note (Signed)
Taking statin as directed. Tolerating well. Repeat labs today.

## 2016-10-30 NOTE — Progress Notes (Signed)
Pre visit review using our clinic review tool, if applicable. No additional management support is needed unless otherwise documented below in the visit note. 

## 2016-10-30 NOTE — Assessment & Plan Note (Signed)
Fasting sugars improving. Will check repeat BMP today. Will give current dose of medication a couple of weeks more in addition to dietary changes to see if more Metformin is required. Follow-up 1 month. Due for repeat A1C at that time.  Flu shot updated today.

## 2016-10-30 NOTE — Patient Instructions (Addendum)
Please go to the lab for blood work. I will call you with your results.  We will alter regimen accordingly.   Please keep a check on sugars alternating timing of checks. I am trying to get the [place] meter approved for you.  For now I am sending in refills of your testing strips.   Follow-up with me in 1 month.

## 2016-10-30 NOTE — Progress Notes (Signed)
Patient presents to clinic today for follow-up of hyperlipidemia and DM. At last visit, Metformin was increased to 500 mg AM and 1000 mg PM. Patient also started on Pravachol 20 mg daily. Is taking all medications as directed. Notes improvement in energy. Denies side effects of medication. Patient denies chest pain, palpitations, lightheadedness, dizziness, vision changes or frequent headaches. Is checking sugars at home. Has a log for review.  Is due for flu shot today.   Past Medical History:  Diagnosis Date  . Arthritis   . Closed fracture dislocation of right elbow    30 years ago -- fell out of theback of a truck  . Hypertension    Diagnosed 10 years ago. No on medication. Denies history of CAD.     Current Outpatient Prescriptions on File Prior to Visit  Medication Sig Dispense Refill  . acetaminophen (TYLENOL) 500 MG tablet Take 500-1,000 mg by mouth every 6 (six) hours as needed (for pain.).    Marland Kitchen clotrimazole (LOTRIMIN) 1 % cream Apply 1 application topically 2 (two) times daily. 30 g 0  . glucose blood test strip Check blood sugars once daily  Dx:E11.65 100 each 12  . Lancets (ONETOUCH ULTRASOFT) lancets Check blood sugars daily. Dx: E11.65 100 each 12  . losartan (COZAAR) 50 MG tablet Take 1 tablet (50 mg total) by mouth daily. 90 tablet 3  . metFORMIN (GLUCOPHAGE) 500 MG tablet Take 1 tablet in the morning and 2 tablets in evening 90 tablet 1  . naproxen sodium (ANAPROX) 220 MG tablet Take 220 mg by mouth daily as needed (for pain.).    Marland Kitchen pravastatin (PRAVACHOL) 20 MG tablet Take 1 tablet (20 mg total) by mouth daily. 30 tablet 3   No current facility-administered medications on file prior to visit.     Allergies  Allergen Reactions  . Antibacterial Hand Soap [Triclosan]     Water blisters  . No Known Allergies     Family History  Problem Relation Age of Onset  . Hypertension Mother   . Dementia Mother        46s  . Transient ischemic attack Mother        59s    . Kidney disease Father   . COPD Father   . Hypertension Brother   . Alcohol abuse Maternal Grandfather   . Mental illness Paternal Grandmother   . Hypertension Daughter   . Healthy Son   . Learning disabilities Son   . Lung cancer Maternal Aunt     Social History   Social History  . Marital status: Married    Spouse name: N/A  . Number of children: 3  . Years of education: N/A   Occupational History  . Retired     Dealer   Social History Main Topics  . Smoking status: Current Every Day Smoker    Packs/day: 1.00    Years: 50.00    Types: Cigarettes    Start date: 08/12/1966  . Smokeless tobacco: Never Used  . Alcohol use No  . Drug use: No  . Sexual activity: Yes   Other Topics Concern  . None   Social History Narrative  . None   Review of Systems - See HPI.  All other ROS are negative.  BP 132/82   Pulse 66   Temp 97.7 F (36.5 C) (Oral)   Resp 14   Ht _0  (1.803 m)   Wt 193 lb (87.5 kg)   SpO2 98%   BMI  26.92 kg/m   Physical Exam  Constitutional: He is oriented to person, place, and time and well-developed, well-nourished, and in no distress.  HENT:  Head: Normocephalic and atraumatic.  Eyes: Conjunctivae are normal.  Cardiovascular: Normal rate, regular rhythm, normal heart sounds and intact distal pulses.   Pulmonary/Chest: Effort normal and breath sounds normal. No respiratory distress. He has no wheezes. He has no rales. He exhibits no tenderness.  Neurological: He is alert and oriented to person, place, and time.  Skin: Skin is warm and dry. No rash noted.  Vitals reviewed.   Recent Results (from the past 2160 hour(s))  I-STAT creatinine     Status: Abnormal   Collection Time: 08/12/16  9:20 PM  Result Value Ref Range   Creatinine, Ser 1.30 (H) 0.61 - 1.24 mg/dL  Basic metabolic panel     Status: Abnormal   Collection Time: 08/18/16  6:24 AM  Result Value Ref Range   Sodium 135 135 - 145 mmol/L   Potassium 3.8 3.5 - 5.1 mmol/L    Chloride 103 101 - 111 mmol/L   CO2 23 22 - 32 mmol/L   Glucose, Bld 243 (H) 65 - 99 mg/dL   BUN 17 6 - 20 mg/dL   Creatinine, Ser 1.06 0.61 - 1.24 mg/dL   Calcium 8.9 8.9 - 10.3 mg/dL   GFR calc non Af Amer >60 >60 mL/min   GFR calc Af Amer >60 >60 mL/min    Comment: (NOTE) The eGFR has been calculated using the CKD EPI equation. This calculation has not been validated in all clinical situations. eGFR's persistently <60 mL/min signify possible Chronic Kidney Disease.    Anion gap 9 5 - 15  CBC     Status: None   Collection Time: 08/18/16  6:24 AM  Result Value Ref Range   WBC 6.1 4.0 - 10.5 K/uL   RBC 4.75 4.22 - 5.81 MIL/uL   Hemoglobin 14.7 13.0 - 17.0 g/dL   HCT 42.8 39.0 - 52.0 %   MCV 90.1 78.0 - 100.0 fL   MCH 30.9 26.0 - 34.0 pg   MCHC 34.3 30.0 - 36.0 g/dL   RDW 12.8 11.5 - 15.5 %   Platelets 252 150 - 400 K/uL  Aerobic/Anaerobic Culture (surgical/deep wound)     Status: None   Collection Time: 08/18/16  8:31 AM  Result Value Ref Range   Specimen Description WOUND    Special Requests CHRONIC OLECRANON BURSA FLUID POF ANCEF    Gram Stain      FEW WBC PRESENT, PREDOMINANTLY PMN NO ORGANISMS SEEN    Culture FEW STAPHYLOCOCCUS AUREUS NO ANAEROBES ISOLATED     Report Status 08/23/2016 FINAL    Organism ID, Bacteria STAPHYLOCOCCUS AUREUS       Susceptibility   Staphylococcus aureus - MIC*    CIPROFLOXACIN <=0.5 SENSITIVE Sensitive     ERYTHROMYCIN <=0.25 SENSITIVE Sensitive     GENTAMICIN <=0.5 SENSITIVE Sensitive     OXACILLIN <=0.25 SENSITIVE Sensitive     TETRACYCLINE <=1 SENSITIVE Sensitive     VANCOMYCIN 1 SENSITIVE Sensitive     TRIMETH/SULFA <=10 SENSITIVE Sensitive     CLINDAMYCIN <=0.25 SENSITIVE Sensitive     RIFAMPIN <=0.5 SENSITIVE Sensitive     Inducible Clindamycin NEGATIVE Sensitive     * FEW STAPHYLOCOCCUS AUREUS  Hepatitis C Antibody     Status: None   Collection Time: 09/10/16  9:50 AM  Result Value Ref Range   HCV Ab NON-REACTIVE  NON-REACTIVE  Comment:                                                                        This test is for screening purposes only.  Reactive results should be confirmed by an alternative method.  Suggest HCV Qualitative, PCR, test code 83130.  Specimens will be stable for reflex testing up to 3 days after collection.   CBC     Status: None   Collection Time: 09/10/16  9:50 AM  Result Value Ref Range   WBC 5.6 4.0 - 10.5 K/uL   RBC 5.29 4.22 - 5.81 Mil/uL   Platelets 239.0 150.0 - 400.0 K/uL   Hemoglobin 16.9 13.0 - 17.0 g/dL   HCT 50.4 39.0 - 52.0 %   MCV 95.2 78.0 - 100.0 fl   MCHC 33.5 30.0 - 36.0 g/dL   RDW 13.8 11.5 - 15.5 %  Comprehensive metabolic panel     Status: Abnormal   Collection Time: 09/10/16  9:50 AM  Result Value Ref Range   Sodium 134 (L) 135 - 145 mEq/L   Potassium 4.3 3.5 - 5.1 mEq/L   Chloride 99 96 - 112 mEq/L   CO2 28 19 - 32 mEq/L   Glucose, Bld 262 (H) 70 - 99 mg/dL   BUN 18 6 - 23 mg/dL   Creatinine, Ser 1.04 0.40 - 1.50 mg/dL   Total Bilirubin 0.5 0.2 - 1.2 mg/dL   Alkaline Phosphatase 86 39 - 117 U/L   AST 13 0 - 37 U/L   ALT 17 0 - 53 U/L   Total Protein 6.9 6.0 - 8.3 g/dL   Albumin 4.1 3.5 - 5.2 g/dL   Calcium 9.5 8.4 - 10.5 mg/dL   GFR 74.89 >60.00 mL/min  Lipid panel     Status: Abnormal   Collection Time: 09/10/16  9:50 AM  Result Value Ref Range   Cholesterol 268 (H) 0 - 200 mg/dL    Comment: ATP III Classification       Desirable:  < 200 mg/dL               Borderline High:  200 - 239 mg/dL          High:  > = 240 mg/dL   Triglycerides (H) 0.0 - 149.0 mg/dL    572.0 Triglyceride is over 400; calculations on Lipids are invalid.    Comment: Normal:  <150 mg/dLBorderline High:  150 - 199 mg/dL   HDL 32.00 (L) >39.00 mg/dL   Total CHOL/HDL Ratio 8     Comment:                Men          Women1/2 Average Risk     3.4          3.3Average Risk          5.0          4.42X Average Risk          9.6          7.13X Average Risk           15.0          11.0  PSA     Status: None   Collection Time: 09/10/16  9:50 AM  Result Value Ref Range   PSA 0.22 0.10 - 4.00 ng/mL    Comment: Test performed using Access Hybritech PSA Assay, a parmagnetic partical, chemiluminecent immunoassay.  Urinalysis, Routine w reflex microscopic     Status: Abnormal   Collection Time: 09/10/16  9:50 AM  Result Value Ref Range   Color, Urine YELLOW Yellow;Lt. Yellow   APPearance CLEAR Clear   Specific Gravity, Urine 1.025 1.000 - 1.030   pH 6.0 5.0 - 8.0   Total Protein, Urine 100 (A) Negative   Urine Glucose >=1000 (A) Negative   Ketones, ur NEGATIVE Negative   Bilirubin Urine NEGATIVE Negative   Hgb urine dipstick NEGATIVE Negative   Urobilinogen, UA 0.2 0.0 - 1.0   Leukocytes, UA NEGATIVE Negative   Nitrite NEGATIVE Negative   WBC, UA 0-2/hpf 0-2/hpf   RBC / HPF none seen 0-2/hpf   Mucus, UA Presence of (A) None   Squamous Epithelial / LPF Rare(0-4/hpf) Rare(0-4/hpf)  LDL cholesterol, direct     Status: None   Collection Time: 09/10/16  9:50 AM  Result Value Ref Range   Direct LDL 114.0 mg/dL    Comment: Optimal:  <100 mg/dLNear or Above Optimal:  100-129 mg/dLBorderline High:  130-159 mg/dLHigh:  160-189 mg/dLVery High:  >190 mg/dL  Hemoglobin A1c     Status: Abnormal   Collection Time: 09/11/16 11:09 AM  Result Value Ref Range   Hgb A1c MFr Bld 11.9 (H) 4.6 - 6.5 %    Comment: Glycemic Control Guidelines for People with Diabetes:Non Diabetic:  <6%Goal of Therapy: <7%Additional Action Suggested:  >8%   Urine Microalbumin w/creat. ratio     Status: Abnormal   Collection Time: 09/18/16  9:35 AM  Result Value Ref Range   Microalb, Ur 44.2 (H) 0.0 - 1.9 mg/dL   Creatinine,U 199.4 mg/dL   Microalb Creat Ratio 22.2 0.0 - 30.0 mg/g  Basic metabolic panel     Status: Abnormal   Collection Time: 09/18/16  9:35 AM  Result Value Ref Range   Sodium 137 135 - 145 mEq/L   Potassium 5.0 3.5 - 5.1 mEq/L   Chloride 103 96 -  112 mEq/L   CO2 29 19 - 32 mEq/L   Glucose, Bld 259 (H) 70 - 99 mg/dL   BUN 18 6 - 23 mg/dL   Creatinine, Ser 1.15 0.40 - 1.50 mg/dL   Calcium 9.2 8.4 - 10.5 mg/dL   GFR 66.68 >60.00 mL/min  Basic metabolic panel     Status: Abnormal   Collection Time: 10/02/16 12:20 PM  Result Value Ref Range   Sodium 138 135 - 145 mEq/L   Potassium 4.8 3.5 - 5.1 mEq/L   Chloride 103 96 - 112 mEq/L   CO2 26 19 - 32 mEq/L   Glucose, Bld 171 (H) 70 - 99 mg/dL   BUN 19 6 - 23 mg/dL   Creatinine, Ser 1.17 0.40 - 1.50 mg/dL   Calcium 9.7 8.4 - 10.5 mg/dL   GFR 65.36 >60.00 mL/min  HM DIABETES EYE EXAM     Status: None   Collection Time: 10/19/16 12:00 AM  Result Value Ref Range   HM Diabetic Eye Exam No Retinopathy No Retinopathy    Assessment/Plan: Essential hypertension BP stable. Continue current regimen.  Hyperlipidemia associated with type 2 diabetes mellitus ([place]) Taking statin as directed. Tolerating well. Repeat labs today.  Diabetes mellitus type II, uncontrolled ([place]) Fasting  sugars improving. Will check repeat BMP today. Will give current dose of medication a couple of weeks more in addition to dietary changes to see if more Metformin is required. Follow-up 1 month. Due for repeat A1C at that time.  Flu shot updated today.    Leeanne Rio, PA-C

## 2016-10-30 NOTE — Assessment & Plan Note (Signed)
BP stable. Continue current regimen.  

## 2016-11-02 ENCOUNTER — Other Ambulatory Visit: Payer: Self-pay | Admitting: General Practice

## 2016-11-02 MED ORDER — PRAVASTATIN SODIUM 40 MG PO TABS: 40.0000 mg | ORAL_TABLET | Freq: Every day | ORAL | 3 refills | Status: DC

## 2016-11-05 DIAGNOSIS — R69 Illness, unspecified: Secondary | ICD-10-CM | POA: Diagnosis not present

## 2016-11-06 ENCOUNTER — Other Ambulatory Visit: Payer: Self-pay | Admitting: Emergency Medicine

## 2016-11-06 DIAGNOSIS — IMO0002 Reserved for concepts with insufficient information to code with codable children: Secondary | ICD-10-CM

## 2016-11-06 DIAGNOSIS — E1165 Type 2 diabetes mellitus with hyperglycemia: Principal | ICD-10-CM

## 2016-11-06 DIAGNOSIS — E1142 Type 2 diabetes mellitus with diabetic polyneuropathy: Secondary | ICD-10-CM

## 2016-11-06 MED ORDER — ONETOUCH LANCETS MISC: 3 refills | Status: DC

## 2016-11-29 DIAGNOSIS — R69 Illness, unspecified: Secondary | ICD-10-CM | POA: Diagnosis not present

## 2016-12-03 NOTE — Progress Notes (Signed)
History of Present Illness: Patient is a 71 y.o. male who presents to clinic today for follow-up of Diabetes Mellitus II, newer onset.  Patient currently on medication regimen of Metformin 500 mg AM and 1000 mg PM.  Is taking medications as directed. Endorses watching dietary proportions and food choices.  Denies exercise at present. Is checking blood glucose as directed. Averaging 90s (before coffee) to 130s-140s. Patient denies chest pain, palpitations, lightheadedness, dizziness, vision changes or frequent headaches.  Latest Maintenance: A1C --  Lab Results  Component Value Date   HGBA1C 11.9 (H) 09/11/2016   Diabetic Eye Exam -- up-to-date Urine Microalbumin -- up-to-date Foot Exam -- up-to-date  Past Medical History:  Diagnosis Date  . Arthritis   . Closed fracture dislocation of right elbow    30 years ago -- fell out of theback of a truck  . Hypertension    Diagnosed 10 years ago. No on medication. Denies history of CAD.     Current Outpatient Medications on File Prior to Visit  Medication Sig Dispense Refill  . acetaminophen (TYLENOL) 500 MG tablet Take 500-1,000 mg by mouth every 6 (six) hours as needed (for pain.).    Marland Kitchen clotrimazole (LOTRIMIN) 1 % cream Apply 1 application topically 2 (two) times daily. 30 g 0  . glucose blood test strip Check blood sugars once daily  Dx:E11.65 100 each 12  . losartan (COZAAR) 50 MG tablet Take 1 tablet (50 mg total) by mouth daily. 90 tablet 3  . metFORMIN (GLUCOPHAGE) 500 MG tablet Take 1 tablet in the morning and 2 tablets in evening 90 tablet 1  . naproxen sodium (ANAPROX) 220 MG tablet Take 220 mg by mouth daily as needed (for pain.).    Marland Kitchen ONE TOUCH LANCETS MISC One Touch Delica 02I lancets Check blood sugars daily Dx:E11.65 100 each 3  . pravastatin (PRAVACHOL) 40 MG tablet Take 1 tablet (40 mg total) by mouth daily. 30 tablet 3   No current facility-administered medications on file prior to visit.     Allergies  Allergen  Reactions  . Antibacterial Hand Soap [Triclosan]     Water blisters  . No Known Allergies     Family History  Problem Relation Age of Onset  . Hypertension Mother   . Dementia Mother        12s  . Transient ischemic attack Mother        29s  . Kidney disease Father   . COPD Father   . Hypertension Brother   . Alcohol abuse Maternal Grandfather   . Mental illness Paternal Grandmother   . Hypertension Daughter   . Healthy Son   . Learning disabilities Son   . Lung cancer Maternal Aunt     Social History   Socioeconomic History  . Marital status: Married    Spouse name: None  . Number of children: 3  . Years of education: None  . Highest education level: None  Social Needs  . Financial resource strain: None  . Food insecurity - worry: None  . Food insecurity - inability: None  . Transportation needs - medical: None  . Transportation needs - non-medical: None  Occupational History  . Occupation: Retired    Comment: Dealer  Tobacco Use  . Smoking status: Current Every Day Smoker    Packs/day: 1.00    Years: 50.00    Pack years: 50.00    Types: Cigarettes    Start date: 08/12/1966  . Smokeless tobacco: Never Used  Substance and Sexual Activity  . Alcohol use: No  . Drug use: No  . Sexual activity: Yes  Other Topics Concern  . None  Social History Narrative  . None    Review of Systems: Pertinent ROS are listed in HPI  Physical Examination: BP 122/82   Pulse 64   Temp 97.7 F (36.5 C) (Oral)   Resp 14   Ht 5\' 11"  (1.803 m)   Wt 187 lb (84.8 kg)   SpO2 98%   BMI 26.08 kg/m  General appearance: alert, cooperative, appears stated age and no distress Lungs: clear to auscultation bilaterally Heart: regular rate and rhythm, S1, S2 normal, no murmur, click, rub or gallop   Assessment/Plan: Essential hypertension BP normotensive. Asymptomatic. Continue current regimen.   Diabetes mellitus type II, uncontrolled ([place]) Blood sugars improved. Discussed  checking fasting glucose. BP stable. Future orders placed for CMP and A1C.

## 2016-12-04 ENCOUNTER — Ambulatory Visit (INDEPENDENT_AMBULATORY_CARE_PROVIDER_SITE_OTHER): Payer: Medicare HMO | Admitting: Physician Assistant

## 2016-12-04 ENCOUNTER — Encounter: Payer: Self-pay | Admitting: Physician Assistant

## 2016-12-04 ENCOUNTER — Other Ambulatory Visit: Payer: Self-pay

## 2016-12-04 VITALS — BP 122/82 | HR 64 | Temp 97.7°F | Resp 14 | Ht 71.0 in | Wt 187.0 lb

## 2016-12-04 DIAGNOSIS — I10 Essential (primary) hypertension: Secondary | ICD-10-CM

## 2016-12-04 DIAGNOSIS — E1165 Type 2 diabetes mellitus with hyperglycemia: Secondary | ICD-10-CM | POA: Diagnosis not present

## 2016-12-04 NOTE — Assessment & Plan Note (Signed)
BP normotensive. Asymptomatic. Continue current regimen.  

## 2016-12-04 NOTE — Patient Instructions (Signed)
Please stay well-hydrated and continue dietary changes. Get a pedometer to help keep tabs on how much you are moving each day. Continue checking sugars -- morning sugar should be before coffee and breakfast.  We will keep current regimen for now. Please follow-up in the lab on or after next Friday 12/11/16 for repeat labs so we can check A1C.  Next follow-up will be determined based on lab results.   Diabetes Mellitus and Exercise Exercising regularly is important for your overall health, especially when you have diabetes (diabetes mellitus). Exercising is not only about losing weight. It has many health benefits, such as increasing muscle strength and bone density and reducing body fat and stress. This leads to improved fitness, flexibility, and endurance, all of which result in better overall health. Exercise has additional benefits for people with diabetes, including:  Reducing appetite.  Helping to lower and control blood glucose.  Lowering blood pressure.  Helping to control amounts of fatty substances (lipids) in the blood, such as cholesterol and triglycerides.  Helping the body to respond better to insulin (improving insulin sensitivity).  Reducing how much insulin the body needs.  Decreasing the risk for heart disease by: ? Lowering cholesterol and triglyceride levels. ? Increasing the levels of good cholesterol. ? Lowering blood glucose levels.  What is my activity plan? Your health care provider or certified diabetes educator can help you make a plan for the type and frequency of exercise (activity plan) that works for you. Make sure that you:  Do at least 150 minutes of moderate-intensity or vigorous-intensity exercise each week. This could be brisk walking, biking, or water aerobics. ? Do stretching and strength exercises, such as yoga or weightlifting, at least 2 times a week. ? Spread out your activity over at least 3 days of the week.  Get some form of physical  activity every day. ? Do not go more than 2 days in a row without some kind of physical activity. ? Avoid being inactive for more than 90 minutes at a time. Take frequent breaks to walk or stretch.  Choose a type of exercise or activity that you enjoy, and set realistic goals.  Start slowly, and gradually increase the intensity of your exercise over time.  What do I need to know about managing my diabetes?  Check your blood glucose before and after exercising. ? If your blood glucose is higher than 240 mg/dL (13.3 mmol/L) before you exercise, check your urine for ketones. If you have ketones in your urine, do not exercise until your blood glucose returns to normal.  Know the symptoms of low blood glucose (hypoglycemia) and how to treat it. Your risk for hypoglycemia increases during and after exercise. Common symptoms of hypoglycemia can include: ? Hunger. ? Anxiety. ? Sweating and feeling clammy. ? Confusion. ? Dizziness or feeling light-headed. ? Increased heart rate or palpitations. ? Blurry vision. ? Tingling or numbness around the mouth, lips, or tongue. ? Tremors or shakes. ? Irritability.  Keep a rapid-acting carbohydrate snack available before, during, and after exercise to help prevent or treat hypoglycemia.  Avoid injecting insulin into areas of the body that are going to be exercised. For example, avoid injecting insulin into: ? The arms, when playing tennis. ? The legs, when jogging.  Keep records of your exercise habits. Doing this can help you and your health care provider adjust your diabetes management plan as needed. Write down: ? Food that you eat before and after you exercise. ? Blood glucose  levels before and after you exercise. ? The type and amount of exercise you have done. ? When your insulin is expected to peak, if you use insulin. Avoid exercising at times when your insulin is peaking.  When you start a new exercise or activity, work with your health  care provider to make sure the activity is safe for you, and to adjust your insulin, medicines, or food intake as needed.  Drink plenty of water while you exercise to prevent dehydration or heat stroke. Drink enough fluid to keep your urine clear or pale yellow. This information is not intended to replace advice given to you by your health care provider. Make sure you discuss any questions you have with your health care provider. Document Released: 04/04/2003 Document Revised: 08/02/2015 Document Reviewed: 06/24/2015 Elsevier Interactive Patient Education  2018 [employer].

## 2016-12-04 NOTE — Assessment & Plan Note (Signed)
Blood sugars improved. Discussed checking fasting glucose. BP stable. Future orders placed for CMP and A1C.

## 2016-12-04 NOTE — Progress Notes (Signed)
Pre visit review using our clinic review tool, if applicable. No additional management support is needed unless otherwise documented below in the visit note. 

## 2016-12-08 DIAGNOSIS — Z1211 Encounter for screening for malignant neoplasm of colon: Secondary | ICD-10-CM | POA: Diagnosis not present

## 2016-12-08 DIAGNOSIS — Z1212 Encounter for screening for malignant neoplasm of rectum: Secondary | ICD-10-CM | POA: Diagnosis not present

## 2016-12-10 LAB — COLOGUARD: COLOGUARD: POSITIVE

## 2016-12-11 ENCOUNTER — Other Ambulatory Visit (INDEPENDENT_AMBULATORY_CARE_PROVIDER_SITE_OTHER): Payer: Medicare HMO

## 2016-12-11 DIAGNOSIS — E1169 Type 2 diabetes mellitus with other specified complication: Secondary | ICD-10-CM | POA: Diagnosis not present

## 2016-12-11 DIAGNOSIS — E785 Hyperlipidemia, unspecified: Secondary | ICD-10-CM

## 2016-12-11 LAB — HEMOGLOBIN A1C: HEMOGLOBIN A1C: 7.2 % — AB (ref 4.6–6.5)

## 2016-12-11 LAB — COMPREHENSIVE METABOLIC PANEL
ALK PHOS: 67 U/L (ref 39–117)
ALT: 10 U/L (ref 0–53)
AST: 13 U/L (ref 0–37)
Albumin: 4.3 g/dL (ref 3.5–5.2)
BILIRUBIN TOTAL: 0.5 mg/dL (ref 0.2–1.2)
BUN: 17 mg/dL (ref 6–23)
CHLORIDE: 103 meq/L (ref 96–112)
CO2: 32 mEq/L (ref 19–32)
CREATININE: 1.16 mg/dL (ref 0.40–1.50)
Calcium: 9.9 mg/dL (ref 8.4–10.5)
GFR: 65.97 mL/min (ref >60.00)
Glucose, Bld: 152 mg/dL — ABNORMAL HIGH (ref 70–99)
POTASSIUM: 5.2 meq/L — AB (ref 3.5–5.1)
SODIUM: 141 meq/L (ref 135–145)
Total Protein: 7 g/dL (ref 6.0–8.3)

## 2016-12-14 ENCOUNTER — Other Ambulatory Visit: Payer: Self-pay | Admitting: Physician Assistant

## 2016-12-14 DIAGNOSIS — E875 Hyperkalemia: Secondary | ICD-10-CM

## 2016-12-21 ENCOUNTER — Telehealth: Payer: Self-pay

## 2016-12-21 ENCOUNTER — Encounter: Payer: Self-pay | Admitting: Physician Assistant

## 2016-12-21 ENCOUNTER — Telehealth: Payer: Self-pay | Admitting: Emergency Medicine

## 2016-12-21 ENCOUNTER — Encounter: Payer: Self-pay | Admitting: Internal Medicine

## 2016-12-21 DIAGNOSIS — R195 Other fecal abnormalities: Secondary | ICD-10-CM

## 2016-12-21 NOTE — Telephone Encounter (Signed)
-----   Message from Gerilyn Nestle, RN sent at 12/21/2016  8:31 AM EST ----- Regarding: Positive Cologuard Positive Cologuard result. Report faxed to office.

## 2016-12-21 NOTE — Telephone Encounter (Signed)
Cologuard results are in PCP folder for review

## 2016-12-21 NOTE — Telephone Encounter (Signed)
Reviewed. Referral to GI for diagnostic colonoscopy placed and patient made aware of results and next steps by RN.

## 2016-12-21 NOTE — Telephone Encounter (Signed)
Spoke with patient regarding positive cologuard results. Advised patient GI will be contacting him to schedule colonoscopy. Patient verbalized understanding, referral ordered.

## 2016-12-21 NOTE — Telephone Encounter (Signed)
Thank you :)

## 2016-12-21 NOTE — Telephone Encounter (Signed)
-----   Message from Brunetta Jeans, PA-C sent at 12/21/2016  8:50 AM EST ----- Regarding: RE: Positive Cologuard Thank you for updating me. Will you call patient to inform them and set up a diagnostic colonoscopy for + cologuard? Thank you.  ----- Message ----- From: Gerilyn Nestle, RN Sent: 12/21/2016   8:31 AM To: Brunetta Jeans, PA-C, Leonidas Romberg, CMA Subject: Positive Cologuard                             Positive Cologuard result. Report faxed to office.

## 2016-12-22 ENCOUNTER — Telehealth: Payer: Self-pay | Admitting: Physician Assistant

## 2016-12-22 NOTE — Telephone Encounter (Signed)
Copied from [place] [id number]. Topic: Quick Communication - See Telephone Encounter >> Dec 22, 2016 10:16 AM Ahmed Prima L wrote: CRM for notification. See Telephone encounter for: Tye [place] from [facility] called and said they sent an abnormal reading through the portal. It is a abnormal color guard result. She just wanted to give the Doctor a heads up and go in there and print it off.   12/22/16.

## 2016-12-22 NOTE — Telephone Encounter (Signed)
Was already noted by RN yesterday. Received fax result as well. Patient has been informed and has been set up for colonoscopy.

## 2016-12-23 ENCOUNTER — Other Ambulatory Visit (INDEPENDENT_AMBULATORY_CARE_PROVIDER_SITE_OTHER): Payer: Medicare HMO

## 2016-12-23 DIAGNOSIS — E875 Hyperkalemia: Secondary | ICD-10-CM

## 2016-12-23 LAB — BASIC METABOLIC PANEL
BUN: 21 mg/dL (ref 6–23)
CO2: 31 mEq/L (ref 19–32)
CREATININE: 1.1 mg/dL (ref 0.40–1.50)
Calcium: 9.7 mg/dL (ref 8.4–10.5)
Chloride: 103 mEq/L (ref 96–112)
GFR: 70.14 mL/min (ref >60.00)
Glucose, Bld: 144 mg/dL — ABNORMAL HIGH (ref 70–99)
Potassium: 5.1 mEq/L (ref 3.5–5.1)
Sodium: 140 mEq/L (ref 135–145)

## 2017-01-12 DIAGNOSIS — R69 Illness, unspecified: Secondary | ICD-10-CM | POA: Diagnosis not present

## 2017-02-03 ENCOUNTER — Ambulatory Visit (AMBULATORY_SURGERY_CENTER): Payer: Self-pay | Admitting: *Deleted

## 2017-02-03 ENCOUNTER — Other Ambulatory Visit: Payer: Self-pay

## 2017-02-03 VITALS — Ht 71.0 in | Wt 182.0 lb

## 2017-02-03 DIAGNOSIS — R195 Other fecal abnormalities: Secondary | ICD-10-CM

## 2017-02-03 NOTE — Progress Notes (Signed)
No egg or soy allergy known to patient  No issues with past sedation with any surgeries  or procedures, no intubation problems  No diet pills per patient No home 02 use per patient  No blood thinners per patient  Pt denies issues with constipation  No A fib or A flutter  EMMI video sent to pt's e mail - pt declined  12-10-2016 pt had positive colo guard

## 2017-02-05 ENCOUNTER — Encounter: Payer: Self-pay | Admitting: Internal Medicine

## 2017-02-10 ENCOUNTER — Other Ambulatory Visit: Payer: Self-pay | Admitting: Physician Assistant

## 2017-02-17 ENCOUNTER — Encounter: Payer: Self-pay | Admitting: Internal Medicine

## 2017-02-17 ENCOUNTER — Other Ambulatory Visit: Payer: Self-pay

## 2017-02-17 ENCOUNTER — Ambulatory Visit (AMBULATORY_SURGERY_CENTER): Payer: Medicare HMO | Admitting: Internal Medicine

## 2017-02-17 VITALS — BP 154/79 | HR 59 | Temp 97.3°F | Resp 13 | Ht 71.0 in | Wt 182.0 lb

## 2017-02-17 DIAGNOSIS — D123 Benign neoplasm of transverse colon: Secondary | ICD-10-CM | POA: Diagnosis not present

## 2017-02-17 DIAGNOSIS — K635 Polyp of colon: Secondary | ICD-10-CM

## 2017-02-17 DIAGNOSIS — E119 Type 2 diabetes mellitus without complications: Secondary | ICD-10-CM | POA: Diagnosis not present

## 2017-02-17 DIAGNOSIS — D122 Benign neoplasm of ascending colon: Secondary | ICD-10-CM | POA: Diagnosis not present

## 2017-02-17 DIAGNOSIS — D124 Benign neoplasm of descending colon: Secondary | ICD-10-CM

## 2017-02-17 DIAGNOSIS — I10 Essential (primary) hypertension: Secondary | ICD-10-CM | POA: Diagnosis not present

## 2017-02-17 DIAGNOSIS — D128 Benign neoplasm of rectum: Secondary | ICD-10-CM | POA: Diagnosis not present

## 2017-02-17 DIAGNOSIS — D129 Benign neoplasm of anus and anal canal: Secondary | ICD-10-CM

## 2017-02-17 DIAGNOSIS — D125 Benign neoplasm of sigmoid colon: Secondary | ICD-10-CM | POA: Diagnosis not present

## 2017-02-17 DIAGNOSIS — R195 Other fecal abnormalities: Secondary | ICD-10-CM | POA: Diagnosis not present

## 2017-02-17 DIAGNOSIS — D127 Benign neoplasm of rectosigmoid junction: Secondary | ICD-10-CM | POA: Diagnosis not present

## 2017-02-17 DIAGNOSIS — Z1211 Encounter for screening for malignant neoplasm of colon: Secondary | ICD-10-CM | POA: Diagnosis not present

## 2017-02-17 MED ORDER — SODIUM CHLORIDE 0.9 % IV SOLN: 500.0000 mL | Freq: Once | INTRAVENOUS | Status: DC

## 2017-02-17 NOTE — Op Note (Signed)
[facility] Patient Name: John Woodward Procedure Date: 02/17/2017 8:52 AM MRN: [id number] Endoscopist: Gatha Mayer , MD Age: 72 Referring MD:  Date of Birth: 1945/12/21 Gender: Male Account #: [id number] Procedure:                Colonoscopy Indications:              Positive Cologuard test Medicines:                Propofol per Anesthesia, Monitored Anesthesia Care Procedure:                Pre-Anesthesia Assessment:                           - Prior to the procedure, a History and Physical                            was performed, and patient medications and                            allergies were reviewed. The patient's tolerance of                            previous anesthesia was also reviewed. The risks                            and benefits of the procedure and the sedation                            options and risks were discussed with the patient.                            All questions were answered, and informed consent                            was obtained. Prior Anticoagulants: The patient has                            taken no previous anticoagulant or antiplatelet                            agents. ASA Grade Assessment: II - A patient with                            mild systemic disease. After reviewing the risks                            and benefits, the patient was deemed in                            satisfactory condition to undergo the procedure.                           After obtaining informed consent, the colonoscope  was passed under direct vision. Throughout the                            procedure, the patient's blood pressure, pulse, and                            oxygen saturations were monitored continuously. The                            Colonoscope was introduced through the anus and                            advanced to the the cecum, identified by                            appendiceal orifice and  ileocecal valve. The                            colonoscopy was performed without difficulty. The                            patient tolerated the procedure well. The quality                            of the bowel preparation was excellent. The bowel                            preparation used was Miralax. The ileocecal valve,                            appendiceal orifice, and rectum were photographed. Scope In: 9:07:53 AM Scope Out: 9:29:28 AM Scope Withdrawal Time: 0 hours 19 minutes 38 seconds  Total Procedure Duration: 0 hours 21 minutes 35 seconds  Findings:                 The perianal and digital rectal examinations were                            normal. Pertinent negatives include normal prostate                            (size, shape, and consistency).                           Two pedunculated polyps were found in the sigmoid                            colon and descending colon. The polyps were 7 to 8                            mm in size. These polyps were removed with a hot                            snare. Resection and retrieval  were complete.                            Verification of patient identification for the                            specimen was done. Estimated blood loss: none.                           Three flat and sessile polyps were found in the                            rectum, sigmoid colon and transverse colon. The                            polyps were 4 to 6 mm in size. These polyps were                            removed with a cold snare. Resection and retrieval                            were complete. Verification of patient                            identification for the specimen was done. Estimated                            blood loss was minimal.                           Two sessile polyps were found in the transverse                            colon and ascending colon. The polyps were 2 mm in                            size. These  polyps were removed with a cold biopsy                            forceps. Resection and retrieval were complete.                            Verification of patient identification for the                            specimen was done. Estimated blood loss was minimal.                           Multiple diverticula were found in the sigmoid                            colon. There was narrowing of the colon in  association with the diverticular opening.                           Scattered diverticula were found in the right colon.                           The exam was otherwise without abnormality on                            direct and retroflexion views. Complications:            No immediate complications. Estimated Blood Loss:     Estimated blood loss was minimal. Impression:               - Two 7 to 8 mm polyps in the sigmoid colon and in                            the descending colon, removed with a hot snare.                            Resected and retrieved.                           - Three 4 to 6 mm polyps in the rectum, in the                            sigmoid colon and in the transverse colon, removed                            with a cold snare. Resected and retrieved.                           - Two 2 mm polyps in the transverse colon and in                            the ascending colon, removed with a cold biopsy                            forceps. Resected and retrieved.                           - Severe diverticulosis in the sigmoid colon. There                            was narrowing of the colon in association with the                            diverticular opening.                           - Diverticulosis in the right colon.                           - The examination was otherwise normal on direct  and retroflexion views. Recommendation:           - Patient has a contact number available for                             emergencies. The signs and symptoms of potential                            delayed complications were discussed with the                            patient. Return to normal activities tomorrow.                            Written discharge instructions were provided to the                            patient.                           - Resume previous diet.                           - Continue present medications.                           - Repeat colonoscopy is recommended. The                            colonoscopy date will be determined after pathology                            results from today's exam become available for                            review.                           - No aspirin, ibuprofen, naproxen, or other                            non-steroidal anti-inflammatory drugs for 2 weeks                            after polyp removal. Gatha Mayer, MD 02/17/2017 9:38:17 AM This report has been signed electronically.

## 2017-02-17 NOTE — Progress Notes (Signed)
Called to room to assist during endoscopic procedure.  Patient ID and intended procedure confirmed with present staff. Received instructions for my participation in the procedure from the performing physician.  

## 2017-02-17 NOTE — Progress Notes (Signed)
Report given to PACU, vss 

## 2017-02-17 NOTE — Progress Notes (Signed)
Pt's states no medical or surgical changes since previsit or office visit. 

## 2017-02-17 NOTE — Patient Instructions (Addendum)
I found and removed 7 polyps - all look benign.  I will let you know pathology results and when to have another routine colonoscopy by mail and/or My Chart. Likely 3 years.  You also have a condition called diverticulosis - common and not usually a problem. Please read the handout provided.  I appreciate the opportunity to care for you. Gatha Mayer, MD, FACG     YOU HAD AN ENDOSCOPIC PROCEDURE TODAY AT [facility]:   Refer to the procedure report that was given to you for any specific questions about what was found during the examination.  If the procedure report does not answer your questions, please call your gastroenterologist to clarify.  If you requested that your care partner not be given the details of your procedure findings, then the procedure report has been included in a sealed envelope for you to review at your convenience later.  YOU SHOULD EXPECT: Some feelings of bloating in the abdomen. Passage of more gas than usual.  Walking can help get rid of the air that was put into your GI tract during the procedure and reduce the bloating. If you had a lower endoscopy (such as a colonoscopy or flexible sigmoidoscopy) you may notice spotting of blood in your stool or on the toilet paper. If you underwent a bowel prep for your procedure, you may not have a normal bowel movement for a few days.  Please Note:  You might notice some irritation and congestion in your nose or some drainage.  This is from the oxygen used during your procedure.  There is no need for concern and it should clear up in a day or so.  SYMPTOMS TO REPORT IMMEDIATELY:   Following lower endoscopy (colonoscopy or flexible sigmoidoscopy):  Excessive amounts of blood in the stool  Significant tenderness or worsening of abdominal pains  Swelling of the abdomen that is new, acute  Fever of 100F or higher    For urgent or emergent issues, a gastroenterologist can be reached at any hour by  calling [phone].   DIET:  We do recommend a small meal at first, but then you may proceed to your regular diet.  Drink plenty of fluids but you should avoid alcoholic beverages for 24 hours.  ACTIVITY:  You should plan to take it easy for the rest of today and you should NOT DRIVE or use heavy machinery until tomorrow (because of the sedation medicines used during the test).    FOLLOW UP: Our staff will call the number listed on your records the next business day following your procedure to check on you and address any questions or concerns that you may have regarding the information given to you following your procedure. If we do not reach you, we will leave a message.  However, if you are feeling well and you are not experiencing any problems, there is no need to return our call.  We will assume that you have returned to your regular daily activities without incident.  If any biopsies were taken you will be contacted by phone or by letter within the next 1-3 weeks.  Please call us at [phone] if you have not heard about the biopsies in 3 weeks.    SIGNATURES/CONFIDENTIALITY: You and/or your care partner have signed paperwork which will be entered into your electronic medical record.  These signatures attest to the fact that that the information above on your After Visit Summary has been reviewed and is  understood.  Full responsibility of the confidentiality of this discharge information lies with you and/or your care-partner.   No aspirin,ibuprofen,naproxen,or other non-steroidal anti-inflammatory drugs for 2 weeks but resume remainder of medications. Information given on polyps and diverticulosis.

## 2017-02-18 ENCOUNTER — Telehealth: Payer: Self-pay

## 2017-02-18 NOTE — Telephone Encounter (Signed)
  Follow up Call-  Call back number 02/17/2017  Post procedure Call Back phone  # [phone]  Permission to leave phone message Yes  Some recent data might be hidden     Patient questions:  Do you have a fever, pain , or abdominal swelling? No. Pain Score  0 *  Have you tolerated food without any problems? Yes.    Have you been able to return to your normal activities? Yes.    Do you have any questions about your discharge instructions: Diet   No. Medications  No. Follow up visit  No.  Do you have questions or concerns about your Care? No.  Actions: * If pain score is 4 or above: No action needed, pain <4.

## 2017-02-23 ENCOUNTER — Encounter: Payer: Self-pay | Admitting: Internal Medicine

## 2017-02-23 DIAGNOSIS — Z860100 Personal history of colon polyps, unspecified: Secondary | ICD-10-CM | POA: Insufficient documentation

## 2017-02-23 HISTORY — DX: Personal history of colon polyps, unspecified: Z86.0100

## 2017-02-23 HISTORY — DX: Personal history of colonic polyps: Z86.010

## 2017-02-23 NOTE — Progress Notes (Signed)
3 adenomas, 3-4transverse hyperplastic Recall 3 yrs 2022

## 2017-03-14 ENCOUNTER — Other Ambulatory Visit: Payer: Self-pay | Admitting: Family Medicine

## 2017-06-14 ENCOUNTER — Other Ambulatory Visit: Payer: Self-pay | Admitting: Physician Assistant

## 2017-06-14 DIAGNOSIS — I10 Essential (primary) hypertension: Secondary | ICD-10-CM

## 2017-06-28 ENCOUNTER — Telehealth: Payer: Self-pay | Admitting: Physician Assistant

## 2017-06-28 NOTE — Telephone Encounter (Signed)
Advised patient wife of her husband rx for the Losartan was sent back on 06/14/17. Spoke with the [facility] and they put the rx on hold but was going to fill for patient.

## 2017-06-28 NOTE — Telephone Encounter (Signed)
Patient's wife [place] came in to the office to request a refill on patient's losartan (COZAAR) 50 MG tablet, as they are going out of town for two weeks. Patient has an appointment for 08/05/17. Requesting call to confirm, call [facility] at [phone].

## 2017-06-28 NOTE — Telephone Encounter (Signed)
90 day supply was sent to their [facility] on 06/14/17. Can we call pharmacy to verify script is there?

## 2017-08-01 ENCOUNTER — Other Ambulatory Visit: Payer: Self-pay | Admitting: Physician Assistant

## 2017-08-05 ENCOUNTER — Ambulatory Visit: Payer: Medicare HMO | Admitting: Physician Assistant

## 2017-08-05 ENCOUNTER — Telehealth: Payer: Self-pay | Admitting: Physician Assistant

## 2017-08-05 ENCOUNTER — Other Ambulatory Visit: Payer: Self-pay

## 2017-08-05 ENCOUNTER — Encounter: Payer: Self-pay | Admitting: Physician Assistant

## 2017-08-05 VITALS — BP 140/80 | HR 71 | Temp 97.9°F | Resp 16 | Ht 71.0 in | Wt 171.0 lb

## 2017-08-05 DIAGNOSIS — E785 Hyperlipidemia, unspecified: Secondary | ICD-10-CM

## 2017-08-05 DIAGNOSIS — R69 Illness, unspecified: Secondary | ICD-10-CM | POA: Diagnosis not present

## 2017-08-05 DIAGNOSIS — I10 Essential (primary) hypertension: Secondary | ICD-10-CM

## 2017-08-05 DIAGNOSIS — E1165 Type 2 diabetes mellitus with hyperglycemia: Secondary | ICD-10-CM

## 2017-08-05 DIAGNOSIS — E1169 Type 2 diabetes mellitus with other specified complication: Secondary | ICD-10-CM

## 2017-08-05 LAB — LIPID PANEL
CHOLESTEROL: 191 mg/dL (ref 0–200)
HDL: 45.1 mg/dL (ref >39.00)
LDL Cholesterol: 114 mg/dL — ABNORMAL HIGH (ref 0–99)
NonHDL: 145.57
Total CHOL/HDL Ratio: 4
Triglycerides: 159 mg/dL — ABNORMAL HIGH (ref 0.0–149.0)
VLDL: 31.8 mg/dL (ref 0.0–40.0)

## 2017-08-05 LAB — COMPREHENSIVE METABOLIC PANEL
ALBUMIN: 4.4 g/dL (ref 3.5–5.2)
ALK PHOS: 76 U/L (ref 39–117)
ALT: 12 U/L (ref 0–53)
AST: 15 U/L (ref 0–37)
BUN: 25 mg/dL — AB (ref 6–23)
CO2: 30 mEq/L (ref 19–32)
CREATININE: 1.18 mg/dL (ref 0.40–1.50)
Calcium: 9.7 mg/dL (ref 8.4–10.5)
Chloride: 104 mEq/L (ref 96–112)
GFR: 64.57 mL/min (ref >60.00)
Glucose, Bld: 122 mg/dL — ABNORMAL HIGH (ref 70–99)
Potassium: 5.2 mEq/L — ABNORMAL HIGH (ref 3.5–5.1)
SODIUM: 141 meq/L (ref 135–145)
TOTAL PROTEIN: 7.3 g/dL (ref 6.0–8.3)
Total Bilirubin: 0.4 mg/dL (ref 0.2–1.2)

## 2017-08-05 LAB — HEMOGLOBIN A1C: HEMOGLOBIN A1C: 6.8 % — AB (ref 4.6–6.5)

## 2017-08-05 MED ORDER — ONETOUCH VERIO FLEX SYSTEM W/DEVICE KIT: PACK | 1 refills | Status: DC

## 2017-08-05 MED ORDER — GLUCOSE BLOOD VI STRP: ORAL_STRIP | 12 refills | Status: DC

## 2017-08-05 NOTE — Telephone Encounter (Signed)
Copied from [facility] [id number]. Topic: Quick Communication - See Telephone Encounter >> Aug 05, 2017  2:18 PM Neva Seat wrote: OneTouch Verioflex meter

## 2017-08-05 NOTE — Telephone Encounter (Signed)
Called patient and was not able to leave a detailed voice message due to voicemail not being set up yet.  OK for [facility] to let the patient know that I have sent a new Onetouch Verio Flex meter and the test strips to the [facility] for the patient.  CRM Created.

## 2017-08-05 NOTE — Patient Instructions (Signed)
Please go to the lab today for blood work.  I will call you with your results. We will alter treatment regimen(s) if indicated by your results.    Diabetes Mellitus and Exercise Exercising regularly is important for your overall health, especially when you have diabetes (diabetes mellitus). Exercising is not only about losing weight. It has many health benefits, such as increasing muscle strength and bone density and reducing body fat and stress. This leads to improved fitness, flexibility, and endurance, all of which result in better overall health. Exercise has additional benefits for people with diabetes, including:  Reducing appetite.  Helping to lower and control blood glucose.  Lowering blood pressure.  Helping to control amounts of fatty substances (lipids) in the blood, such as cholesterol and triglycerides.  Helping the body to respond better to insulin (improving insulin sensitivity).  Reducing how much insulin the body needs.  Decreasing the risk for heart disease by: ? Lowering cholesterol and triglyceride levels. ? Increasing the levels of good cholesterol. ? Lowering blood glucose levels.  What is my activity plan? Your health care provider or certified diabetes educator can help you make a plan for the type and frequency of exercise (activity plan) that works for you. Make sure that you:  Do at least 150 minutes of moderate-intensity or vigorous-intensity exercise each week. This could be brisk walking, biking, or water aerobics. ? Do stretching and strength exercises, such as yoga or weightlifting, at least 2 times a week. ? Spread out your activity over at least 3 days of the week.  Get some form of physical activity every day. ? Do not go more than 2 days in a row without some kind of physical activity. ? Avoid being inactive for more than 90 minutes at a time. Take frequent breaks to walk or stretch.  Choose a type of exercise or activity that you enjoy, and  set realistic goals.  Start slowly, and gradually increase the intensity of your exercise over time.  What do I need to know about managing my diabetes?  Check your blood glucose before and after exercising. ? If your blood glucose is higher than 240 mg/dL (13.3 mmol/L) before you exercise, check your urine for ketones. If you have ketones in your urine, do not exercise until your blood glucose returns to normal.  Know the symptoms of low blood glucose (hypoglycemia) and how to treat it. Your risk for hypoglycemia increases during and after exercise. Common symptoms of hypoglycemia can include: ? Hunger. ? Anxiety. ? Sweating and feeling clammy. ? Confusion. ? Dizziness or feeling light-headed. ? Increased heart rate or palpitations. ? Blurry vision. ? Tingling or numbness around the mouth, lips, or tongue. ? Tremors or shakes. ? Irritability.  Keep a rapid-acting carbohydrate snack available before, during, and after exercise to help prevent or treat hypoglycemia.  Avoid injecting insulin into areas of the body that are going to be exercised. For example, avoid injecting insulin into: ? The arms, when playing tennis. ? The legs, when jogging.  Keep records of your exercise habits. Doing this can help you and your health care provider adjust your diabetes management plan as needed. Write down: ? Food that you eat before and after you exercise. ? Blood glucose levels before and after you exercise. ? The type and amount of exercise you have done. ? When your insulin is expected to peak, if you use insulin. Avoid exercising at times when your insulin is peaking.  When you start a new   exercise or activity, work with your health care provider to make sure the activity is safe for you, and to adjust your insulin, medicines, or food intake as needed.  Drink plenty of water while you exercise to prevent dehydration or heat stroke. Drink enough fluid to keep your urine clear or pale  yellow. This information is not intended to replace advice given to you by your health care provider. Make sure you discuss any questions you have with your health care provider. Document Released: 04/04/2003 Document Revised: 08/02/2015 Document Reviewed: 06/24/2015 Elsevier Interactive Patient Education  2018 Elsevier Inc.  

## 2017-08-05 NOTE — Assessment & Plan Note (Signed)
Taking statin as directed. Has lost 11 lbs with dietary changed and exercise. Repeat fasting labs today.

## 2017-08-05 NOTE — Progress Notes (Signed)
History of Present Illness: Patient is a 72 y.o. male who presents to clinic today for follow-up of Diabetes Mellitus II, without complications.  Patient currently on medication regimen of Metformin 500 AM and 1000 PM.  Is taking medications as directed. Endorses feeling great overall. Is watching diet -- cutting out carbohydrates.  Denies sedentary lifestyle. Was checking blood glucose as directed, but glucose meter broke.   Latest Maintenance: A1C --  Lab Results  Component Value Date   HGBA1C 7.2 (H) 12/11/2016   Diabetic Eye Exam -- up-to-date. No hx of retinopathy. Urine Microalbumin -- on ARB Foot Exam -- up-to-date. Denies concern.  Patient is taking losartan 50 mg daily as directed along with pravachol 40 mg daily. Is tolerating well. Patient denies chest pain, palpitations, lightheadedness, dizziness, vision changes or frequent headaches.  BP Readings from Last 3 Encounters:  08/05/17 140/80  02/17/17 (!) 154/79  12/04/16 122/82   Past Medical History:  Diagnosis Date  . Arthritis   . Closed fracture dislocation of right elbow    30 years ago -- fell out of theback of a truck  . Diabetes mellitus without complication ([place])   . Hx of colonic polyps 02/23/2017  . Hyperlipidemia   . Hypertension    Diagnosed 10 years ago. No on medication. Denies history of CAD.     Current Outpatient Medications on File Prior to Visit  Medication Sig Dispense Refill  . acetaminophen (TYLENOL) 500 MG tablet Take 500-1,000 mg by mouth every 6 (six) hours as needed (for pain.).    Marland Kitchen aspirin 81 MG chewable tablet Chew 81 mg by mouth daily.    Marland Kitchen glucose blood test strip Check blood sugars once daily  Dx:E11.65 100 each 12  . losartan (COZAAR) 50 MG tablet TAKE 1 TABLET BY MOUTH ONCE DAILY 90 tablet 0  . metFORMIN (GLUCOPHAGE) 500 MG tablet TAKE 1 TABLET BY MOUTH IN THE MORNING AND TAKE 2 TABLETS IN THE EVENING 90 tablet 0  . ONE TOUCH LANCETS MISC One Touch Delica 66Z lancets Check blood  sugars daily Dx:E11.65 100 each 3  . pravastatin (PRAVACHOL) 40 MG tablet TAKE 1 TABLET BY MOUTH ONCE DAILY 90 tablet 1   No current facility-administered medications on file prior to visit.     Allergies  Allergen Reactions  . Antibacterial Hand Soap [Triclosan]     Water blisters    Family History  Problem Relation Age of Onset  . Hypertension Mother   . Dementia Mother        26s  . Transient ischemic attack Mother        74s  . Kidney disease Father   . COPD Father   . Hypertension Brother   . Alcohol abuse Maternal Grandfather   . Mental illness Paternal Grandmother   . Hypertension Daughter   . Healthy Son   . Learning disabilities Son   . Lung cancer Maternal Aunt   . Colon cancer Neg Hx   . Colon polyps Neg Hx   . Esophageal cancer Neg Hx   . Rectal cancer Neg Hx   . Stomach cancer Neg Hx     Social History   Socioeconomic History  . Marital status: Married    Spouse name: Not on file  . Number of children: 3  . Years of education: Not on file  . Highest education level: Not on file  Occupational History  . Occupation: Retired    Comment: Manufacturing systems engineer  . Emergency planning/management officer  strain: Not on file  . Food insecurity:    Worry: Not on file    Inability: Not on file  . Transportation needs:    Medical: Not on file    Non-medical: Not on file  Tobacco Use  . Smoking status: Current Every Day Smoker    Packs/day: 1.00    Years: 50.00    Pack years: 50.00    Types: Cigarettes    Start date: 08/12/1966  . Smokeless tobacco: Never Used  Substance and Sexual Activity  . Alcohol use: No  . Drug use: No  . Sexual activity: Yes  Lifestyle  . Physical activity:    Days per week: Not on file    Minutes per session: Not on file  . Stress: Not on file  Relationships  . Social connections:    Talks on phone: Not on file    Gets together: Not on file    Attends religious service: Not on file    Active member of club or organization: Not on file      Attends meetings of clubs or organizations: Not on file    Relationship status: Not on file  Other Topics Concern  . Not on file  Social History Narrative  . Not on file   Review of Systems: Pertinent ROS are listed in HPI  Physical Examination: BP 140/80   Pulse 71   Temp 97.9 F (36.6 C) (Oral)   Resp 16   Ht 5\' 11"  (1.803 m)   Wt 171 lb (77.6 kg)   SpO2 97%   BMI 23.85 kg/m  General appearance: alert, cooperative, appears stated age and no distress Ears: normal TM's and external ear canals both ears Nose: Nares normal. Septum midline. Mucosa normal. No drainage or sinus tenderness. Throat: lips, mucosa, and tongue normal; teeth and gums normal Lungs: clear to auscultation bilaterally Heart: regular rate and rhythm, S1, S2 normal, no murmur, click, rub or gallop Extremities: extremities normal, atraumatic, no cyanosis or edema  Assessment/Plan: Essential hypertension BP acceptable today giving age and concern for hypertension. Just smoked a cigarette prior to his appointment which is likely contributing to mild elevation today. We are working on smoking cessation. Continue diet and exercise. Will continue current regimen. Repeat labs today.  Hyperlipidemia associated with type 2 diabetes mellitus ([place]) Taking statin as directed. Has lost 11 lbs with dietary changed and exercise. Repeat fasting labs today.  Diabetes mellitus type II, uncontrolled ([place]) Eye exam, foot exam and immunizations up-to-date. Patient is taking ARB as directed.  He is to check at home to see what meter he had so we can send in new RX for glucometer and testing strips/lancets. Will check CMP and A1C today. Continue current regimen for now. Will alter based on results.

## 2017-08-05 NOTE — Assessment & Plan Note (Signed)
BP acceptable today giving age and concern for hypertension. Just smoked a cigarette prior to his appointment which is likely contributing to mild elevation today. We are working on smoking cessation. Continue diet and exercise. Will continue current regimen. Repeat labs today.

## 2017-08-05 NOTE — Assessment & Plan Note (Signed)
Eye exam, foot exam and immunizations up-to-date. Patient is taking ARB as directed.  He is to check at home to see what meter he had so we can send in new RX for glucometer and testing strips/lancets. Will check CMP and A1C today. Continue current regimen for now. Will alter based on results.

## 2017-08-06 ENCOUNTER — Other Ambulatory Visit: Payer: Self-pay | Admitting: Emergency Medicine

## 2017-08-06 ENCOUNTER — Other Ambulatory Visit: Payer: Self-pay | Admitting: Physician Assistant

## 2017-08-06 DIAGNOSIS — E785 Hyperlipidemia, unspecified: Principal | ICD-10-CM

## 2017-08-06 DIAGNOSIS — E1169 Type 2 diabetes mellitus with other specified complication: Secondary | ICD-10-CM

## 2017-08-06 MED ORDER — PRAVASTATIN SODIUM 80 MG PO TABS: 80.0000 mg | ORAL_TABLET | Freq: Every day | ORAL | 1 refills | Status: DC

## 2017-09-05 ENCOUNTER — Other Ambulatory Visit: Payer: Self-pay | Admitting: Physician Assistant

## 2017-09-05 DIAGNOSIS — I10 Essential (primary) hypertension: Secondary | ICD-10-CM

## 2017-09-07 ENCOUNTER — Encounter: Payer: Self-pay | Admitting: Acute Care

## 2018-01-04 ENCOUNTER — Other Ambulatory Visit: Payer: Self-pay | Admitting: Physician Assistant

## 2018-01-04 DIAGNOSIS — I10 Essential (primary) hypertension: Secondary | ICD-10-CM

## 2018-02-02 ENCOUNTER — Other Ambulatory Visit: Payer: Self-pay | Admitting: Physician Assistant

## 2018-02-02 DIAGNOSIS — I10 Essential (primary) hypertension: Secondary | ICD-10-CM

## 2018-02-04 ENCOUNTER — Encounter: Payer: Self-pay | Admitting: Physician Assistant

## 2018-02-04 ENCOUNTER — Ambulatory Visit (INDEPENDENT_AMBULATORY_CARE_PROVIDER_SITE_OTHER): Payer: Medicare HMO | Admitting: Physician Assistant

## 2018-02-04 ENCOUNTER — Other Ambulatory Visit: Payer: Self-pay

## 2018-02-04 VITALS — BP 128/82 | HR 65 | Temp 97.8°F | Resp 16 | Ht 71.0 in | Wt 169.0 lb

## 2018-02-04 DIAGNOSIS — I10 Essential (primary) hypertension: Secondary | ICD-10-CM

## 2018-02-04 DIAGNOSIS — Z122 Encounter for screening for malignant neoplasm of respiratory organs: Secondary | ICD-10-CM

## 2018-02-04 DIAGNOSIS — E1165 Type 2 diabetes mellitus with hyperglycemia: Secondary | ICD-10-CM

## 2018-02-04 DIAGNOSIS — E1151 Type 2 diabetes mellitus with diabetic peripheral angiopathy without gangrene: Secondary | ICD-10-CM | POA: Diagnosis not present

## 2018-02-04 DIAGNOSIS — E1169 Type 2 diabetes mellitus with other specified complication: Secondary | ICD-10-CM

## 2018-02-04 DIAGNOSIS — Z0000 Encounter for general adult medical examination without abnormal findings: Secondary | ICD-10-CM

## 2018-02-04 DIAGNOSIS — Z136 Encounter for screening for cardiovascular disorders: Secondary | ICD-10-CM | POA: Diagnosis not present

## 2018-02-04 DIAGNOSIS — Z23 Encounter for immunization: Secondary | ICD-10-CM | POA: Diagnosis not present

## 2018-02-04 DIAGNOSIS — Z720 Tobacco use: Secondary | ICD-10-CM | POA: Diagnosis not present

## 2018-02-04 DIAGNOSIS — E785 Hyperlipidemia, unspecified: Secondary | ICD-10-CM

## 2018-02-04 DIAGNOSIS — Z125 Encounter for screening for malignant neoplasm of prostate: Secondary | ICD-10-CM

## 2018-02-04 DIAGNOSIS — Z1283 Encounter for screening for malignant neoplasm of skin: Secondary | ICD-10-CM

## 2018-02-04 LAB — CBC WITH DIFFERENTIAL/PLATELET
BASOS ABS: 0 10*3/uL (ref 0.0–0.1)
Basophils Relative: 0.7 % (ref 0.0–3.0)
EOS ABS: 0.2 10*3/uL (ref 0.0–0.7)
Eosinophils Relative: 3.1 % (ref 0.0–5.0)
HEMATOCRIT: 48.1 % (ref 39.0–52.0)
Hemoglobin: 16.1 g/dL (ref 13.0–17.0)
LYMPHS PCT: 24.7 % (ref 12.0–46.0)
Lymphs Abs: 1.4 10*3/uL (ref 0.7–4.0)
MCHC: 33.4 g/dL (ref 30.0–36.0)
MCV: 96.3 fl (ref 78.0–100.0)
MONOS PCT: 8 % (ref 3.0–12.0)
Monocytes Absolute: 0.4 10*3/uL (ref 0.1–1.0)
Neutro Abs: 3.6 10*3/uL (ref 1.4–7.7)
Neutrophils Relative %: 63.5 % (ref 43.0–77.0)
Platelets: 245 10*3/uL (ref 150.0–400.0)
RBC: 5 Mil/uL (ref 4.22–5.81)
RDW: 13.6 % (ref 11.5–15.5)
WBC: 5.6 10*3/uL (ref 4.0–10.5)

## 2018-02-04 LAB — LIPID PANEL
CHOL/HDL RATIO: 4
CHOLESTEROL: 160 mg/dL (ref 0–200)
HDL: 40.5 mg/dL (ref >39.00)
LDL CALC: 85 mg/dL (ref 0–99)
NonHDL: 119.77
TRIGLYCERIDES: 175 mg/dL — AB (ref 0.0–149.0)
VLDL: 35 mg/dL (ref 0.0–40.0)

## 2018-02-04 LAB — COMPREHENSIVE METABOLIC PANEL
ALK PHOS: 69 U/L (ref 39–117)
ALT: 12 U/L (ref 0–53)
AST: 14 U/L (ref 0–37)
Albumin: 4 g/dL (ref 3.5–5.2)
BILIRUBIN TOTAL: 0.5 mg/dL (ref 0.2–1.2)
BUN: 17 mg/dL (ref 6–23)
CALCIUM: 9.2 mg/dL (ref 8.4–10.5)
CO2: 30 meq/L (ref 19–32)
CREATININE: 1.03 mg/dL (ref 0.40–1.50)
Chloride: 102 mEq/L (ref 96–112)
GFR: 75.43 mL/min (ref >60.00)
GLUCOSE: 101 mg/dL — AB (ref 70–99)
Potassium: 4.2 mEq/L (ref 3.5–5.1)
Sodium: 139 mEq/L (ref 135–145)
Total Protein: 6.6 g/dL (ref 6.0–8.3)

## 2018-02-04 LAB — PSA, MEDICARE: PSA: 0.16 ng/mL (ref 0.10–4.00)

## 2018-02-04 LAB — HEMOGLOBIN A1C: Hgb A1c MFr Bld: 6.6 % — ABNORMAL HIGH (ref 4.6–6.5)

## 2018-02-04 MED ORDER — NICOTINE 21 MG/24HR TD PT24: 21.0000 mg | MEDICATED_PATCH | Freq: Every day | TRANSDERMAL | 0 refills | Status: DC

## 2018-02-04 NOTE — Progress Notes (Deleted)
Established Patient Office Visit  Subjective:  Patient ID: John Fassnacht., male    DOB: 1945-09-23  Age: 73 y.o. MRN: [id number]  CC:  Chief Complaint  Patient presents with  . Annual Exam    HPI John Parekh. presents for annual physical  Patient states his energy levels are normal  Patient states he is adhering to medication regimen and blood glucose to 138 was highest over holidays. Usually in range of 90-120.   Trying to stay mindful of portions and diet. Is working everyday in shop Patient admits to needing to quit smoking- might try the nicotine gum.    Past Medical History:  Diagnosis Date  . Arthritis   . Closed fracture dislocation of right elbow    30 years ago -- fell out of theback of a truck  . Diabetes mellitus without complication ([place])   . Hx of colonic polyps 02/23/2017  . Hyperlipidemia   . Hypertension    Diagnosed 10 years ago. No on medication. Denies history of CAD.     Past Surgical History:  Procedure Laterality Date  . OLECRANON BURSECTOMY Right 08/18/2016   Procedure: OLECRANON BURSECTOMY;  Surgeon: Meredith Pel, MD;  Location: [facility];  Service: Orthopedics;  Laterality: Right;    Family History  Problem Relation Age of Onset  . Hypertension Mother   . Dementia Mother        60s  . Transient ischemic attack Mother        19s  . Kidney disease Father   . COPD Father   . Hypertension Brother   . Alcohol abuse Maternal Grandfather   . Mental illness Paternal Grandmother   . Hypertension Daughter   . Healthy Son   . Learning disabilities Son   . Lung cancer Maternal Aunt   . Colon cancer Neg Hx   . Colon polyps Neg Hx   . Esophageal cancer Neg Hx   . Rectal cancer Neg Hx   . Stomach cancer Neg Hx     Social History   Socioeconomic History  . Marital status: Married    Spouse name: Not on file  . Number of children: 3  . Years of education: Not on file  . Highest education level: Not on file  Occupational  History  . Occupation: Retired    Comment: Manufacturing systems engineer  . Financial resource strain: Not on file  . Food insecurity:    Worry: Not on file    Inability: Not on file  . Transportation needs:    Medical: Not on file    Non-medical: Not on file  Tobacco Use  . Smoking status: Current Every Day Smoker    Packs/day: 1.00    Years: 50.00    Pack years: 50.00    Types: Cigarettes    Start date: 08/12/1966  . Smokeless tobacco: Never Used  Substance and Sexual Activity  . Alcohol use: No  . Drug use: No  . Sexual activity: Yes  Lifestyle  . Physical activity:    Days per week: Not on file    Minutes per session: Not on file  . Stress: Not on file  Relationships  . Social connections:    Talks on phone: Not on file    Gets together: Not on file    Attends religious service: Not on file    Active member of club or organization: Not on file    Attends meetings of clubs or organizations: Not  on file    Relationship status: Not on file  . Intimate partner violence:    Fear of current or ex partner: Not on file    Emotionally abused: Not on file    Physically abused: Not on file    Forced sexual activity: Not on file  Other Topics Concern  . Not on file  Social History Narrative  . Not on file    Outpatient Medications Prior to Visit  Medication Sig Dispense Refill  . acetaminophen (TYLENOL) 500 MG tablet Take 500-1,000 mg by mouth every 6 (six) hours as needed (for pain.).    Marland Kitchen aspirin 81 MG chewable tablet Chew 81 mg by mouth daily.    . Blood Glucose Monitoring Suppl ([place]) w/Device KIT Use to test blood sugars one time daily. 1 kit 1  . glucose blood (ONETOUCH VERIO) test strip Use as instructed to test blood sugars one time daily. Dx Code E11.65 100 each 12  . losartan (COZAAR) 50 MG tablet TAKE 1 TABLET BY MOUTH ONCE DAILY 30 tablet 0  . metFORMIN (GLUCOPHAGE) 500 MG tablet TAKE 1 TABLET BY MOUTH IN THE MORNING AND TAKE 2 TABLETS IN THE  EVENING 270 tablet 1  . ONE TOUCH LANCETS MISC One Touch Delica 50I lancets Check blood sugars daily Dx:E11.65 100 each 3  . pravastatin (PRAVACHOL) 80 MG tablet Take 1 tablet (80 mg total) by mouth daily. 90 tablet 1  . glucose blood test strip Check blood sugars once daily  Dx:E11.65 100 each 12  . metFORMIN (GLUCOPHAGE) 500 MG tablet TAKE 1 TABLET BY MOUTH IN THE MORNING AND TAKE 2 TABLETS IN THE EVENING 90 tablet 0   No facility-administered medications prior to visit.     Allergies  Allergen Reactions  . Antibacterial Hand Soap [Triclosan]     Water blisters    ROS Review of Systems  Constitutional: Negative for chills, fatigue, fever and unexpected weight change.  HENT: Negative.   Respiratory: Negative.   Cardiovascular: Negative for chest pain and leg swelling.  Gastrointestinal: Positive for diarrhea (2-3 days per week - one loose stool in the morning states it started after colonoscopy). Negative for blood in stool, constipation, nausea and vomiting.  Genitourinary: Positive for frequency (gets up every 2-3 hours in night). Negative for difficulty urinating, discharge, dysuria, hematuria, penile pain, penile swelling, scrotal swelling, testicular pain and urgency.  Musculoskeletal: Positive for back pain. Negative for arthralgias, gait problem, joint swelling, myalgias, neck pain and neck stiffness.  Neurological: Positive for numbness (transient numbness in toes - pt attributes to back pain). Negative for dizziness, tremors, syncope, weakness, light-headedness and headaches.  Psychiatric/Behavioral: Negative.       Objective:    Physical Exam  Constitutional: He is oriented to person, place, and time. He appears well-developed and well-nourished.  HENT:  Head: Normocephalic.  Right Ear: Tympanic membrane, external ear and ear canal normal.  Left Ear: Tympanic membrane, external ear and ear canal normal.  Mouth/Throat: Uvula is midline, oropharynx is clear and moist and  mucous membranes are normal.  Eyes: Pupils are equal, round, and reactive to light. Conjunctivae are normal.  Neck: Normal range of motion. Neck supple.  Cardiovascular: Normal rate and regular rhythm.  Pulmonary/Chest: Breath sounds normal.  Abdominal: Soft. Bowel sounds are normal.  Neurological: He is alert and oriented to person, place, and time. He has normal strength.  Skin: Skin is warm, dry and intact.  Psychiatric: He has a normal mood and affect.  His speech is normal and behavior is normal. Thought content normal.    BP 128/82   Pulse 65   Temp 97.8 F (36.6 C) (Oral)   Resp 16   Ht 5' 11" (1.803 m)   Wt 76.7 kg   SpO2 97%   BMI 23.57 kg/m  Wt Readings from Last 3 Encounters:  02/04/18 76.7 kg  08/05/17 77.6 kg  02/17/17 82.6 kg     Health Maintenance Due  Topic Date Due  . DTaP/Tdap/Td (1 - Tdap) 12/26/1956  . TETANUS/TDAP  12/26/1964  . INFLUENZA VACCINE  08/26/2017  . PNA vac Low Risk Adult (2 of 2 - PPSV23) 09/10/2017  . FOOT EXAM  09/18/2017  . OPHTHALMOLOGY EXAM  10/19/2017    There are no preventive care reminders to display for this patient.  No results found for: TSH Lab Results  Component Value Date   WBC 5.6 09/10/2016   HGB 16.9 09/10/2016   HCT 50.4 09/10/2016   MCV 95.2 09/10/2016   PLT 239.0 09/10/2016   Lab Results  Component Value Date   NA 141 08/05/2017   K 5.2 (H) 08/05/2017   CO2 30 08/05/2017   GLUCOSE 122 (H) 08/05/2017   BUN 25 (H) 08/05/2017   CREATININE 1.18 08/05/2017   BILITOT 0.4 08/05/2017   ALKPHOS 76 08/05/2017   AST 15 08/05/2017   ALT 12 08/05/2017   PROT 7.3 08/05/2017   ALBUMIN 4.4 08/05/2017   CALCIUM 9.7 08/05/2017   ANIONGAP 9 08/18/2016   GFR 64.57 08/05/2017   Lab Results  Component Value Date   CHOL 191 08/05/2017   Lab Results  Component Value Date   HDL 45.10 08/05/2017   Lab Results  Component Value Date   LDLCALC 114 (H) 08/05/2017   Lab Results  Component Value Date   TRIG 159.0  (H) 08/05/2017   Lab Results  Component Value Date   CHOLHDL 4 08/05/2017   Lab Results  Component Value Date   HGBA1C 6.8 (H) 08/05/2017      Assessment & Plan:   Problem List Items Addressed This Visit    None      No orders of the defined types were placed in this encounter.   Follow-up: No follow-ups on file.    John Woodward, Student-PA

## 2018-02-04 NOTE — Patient Instructions (Signed)
Please go to the lab for blood work.   Our office will call you with your results unless you have chosen to receive results via MyChart.  If your blood work is normal we will follow-up each year for physicals and as scheduled for chronic medical problems.  If anything is abnormal we will treat accordingly and get you in for a follow-up.  You will be contacted for your yearly lung cancer screen, for an [place] to assess for aortic aneurysm (smoking history) and by a dermatologist for a comprehensive skin exam.   Preventive Care 71 Years and Older, Male Preventive care refers to lifestyle choices and visits with your health care provider that can promote health and wellness. What does preventive care include?   A yearly physical exam. This is also called an annual well check.  Dental exams once or twice a year.  Routine eye exams. Ask your health care provider how often you should have your eyes checked.  Personal lifestyle choices, including: ? Daily care of your teeth and gums. ? Regular physical activity. ? Eating a healthy diet. ? Avoiding tobacco and drug use. ? Limiting alcohol use. ? Practicing safe sex. ? Taking low doses of aspirin every day. ? Taking vitamin and mineral supplements as recommended by your health care provider. What happens during an annual well check? The services and screenings done by your health care provider during your annual well check will depend on your age, overall health, lifestyle risk factors, and family history of disease. Counseling Your health care provider may ask you questions about your:  Alcohol use.  Tobacco use.  Drug use.  Emotional well-being.  Home and relationship well-being.  Sexual activity.  Eating habits.  History of falls.  Memory and ability to understand (cognition).  Work and work Statistician. Screening You may have the following tests or measurements:  Height, weight, and BMI.  Blood pressure.  Lipid  and cholesterol levels. These may be checked every 5 years, or more frequently if you are over 23 years old.  Skin check.  Lung cancer screening. You may have this screening every year starting at age 13 if you have a 30-pack-year history of smoking and currently smoke or have quit within the past 15 years.  Colorectal cancer screening. All adults should have this screening starting at age 61 and continuing until age 16. You will have tests every 1-10 years, depending on your results and the type of screening test. People at increased risk should start screening at an earlier age. Screening tests may include: ? Guaiac-based fecal occult blood testing. ? Fecal immunochemical test (FIT). ? Stool DNA test. ? Virtual colonoscopy. ? Sigmoidoscopy. During this test, a flexible tube with a tiny camera (sigmoidoscope) is used to examine your rectum and lower colon. The sigmoidoscope is inserted through your anus into your rectum and lower colon. ? Colonoscopy. During this test, a long, thin, flexible tube with a tiny camera (colonoscope) is used to examine your entire colon and rectum.  Prostate cancer screening. Recommendations will vary depending on your family history and other risks.  Hepatitis C blood test.  Hepatitis B blood test.  Sexually transmitted disease (STD) testing.  Diabetes screening. This is done by checking your blood sugar (glucose) after you have not eaten for a while (fasting). You may have this done every 1-3 years.  Abdominal aortic aneurysm (AAA) screening. You may need this if you are a current or former smoker.  Osteoporosis. You may be screened starting  at age 37 if you are at high risk. Talk with your health care provider about your test results, treatment options, and if necessary, the need for more tests. Vaccines Your health care provider may recommend certain vaccines, such as:  Influenza vaccine. This is recommended every year.  Tetanus, diphtheria, and  acellular pertussis (Tdap, Td) vaccine. You may need a Td booster every 10 years.  Varicella vaccine. You may need this if you have not been vaccinated.  Zoster vaccine. You may need this after age 67.  Measles, mumps, and rubella (MMR) vaccine. You may need at least one dose of MMR if you were born in 1957 or later. You may also need a second dose.  Pneumococcal 13-valent conjugate (PCV13) vaccine. One dose is recommended after age 53.  Pneumococcal polysaccharide (PPSV23) vaccine. One dose is recommended after age 28.  Meningococcal vaccine. You may need this if you have certain conditions.  Hepatitis A vaccine. You may need this if you have certain conditions or if you travel or work in places where you may be exposed to hepatitis A.  Hepatitis B vaccine. You may need this if you have certain conditions or if you travel or work in places where you may be exposed to hepatitis B.  Haemophilus influenzae type b (Hib) vaccine. You may need this if you have certain risk factors. Talk to your health care provider about which screenings and vaccines you need and how often you need them. This information is not intended to replace advice given to you by your health care provider. Make sure you discuss any questions you have with your health care provider. Document Released: 02/08/2015 Document Revised: 03/04/2017 Document Reviewed: 11/13/2014 Elsevier Interactive Patient Education  2019 [employer].

## 2018-02-04 NOTE — Progress Notes (Signed)
Patient presents to clinic today for annual exam.  Patient is fasting for labs.  Acute Concerns: Denies any acute concerns today.  Chronic Issues: Hypertension -- Patient is currently on a regimen of losartan 50 mg daily. Endorses taking medications as directed. Patient denies chest pain, palpitations, lightheadedness, dizziness, vision changes or frequent headaches.  BP Readings from Last 3 Encounters:  02/04/18 128/82  08/05/17 140/80  02/17/17 (!) 154/79   Hyperlipidemia -- On a regimen of Pravastatin 80 mg daily. Endorses taking medications as directed. Trying to keep a well-balanced diet and to stay active.  Diabetes Mellitus II -- Patient currently on a regimen of Metformin 500 mg AM and 1000 mg PM. Is taking medication as directed. Notes fasting glucose averaging 90-120. Endorses eye exam up-to-date. Will need records. Due for foot examination today.   Tobacco Use Disorder -- Endorses willingness and readiness to quit. . Interest in Nicorette gum or other options. Has not tried anything. Would like to discuss further today.   Health Maintenance: Immunizations -- Due for 2nd pneumonia and flu shot today. Colonoscopy -- Up-to-date. Due in Summer.  Past Medical History:  Diagnosis Date  . Arthritis   . Closed fracture dislocation of right elbow    30 years ago -- fell out of theback of a truck  . Diabetes mellitus without complication ([place])   . Hx of colonic polyps 02/23/2017  . Hyperlipidemia   . Hypertension    Diagnosed 10 years ago. No on medication. Denies history of CAD.     Past Surgical History:  Procedure Laterality Date  . OLECRANON BURSECTOMY Right 08/18/2016   Procedure: OLECRANON BURSECTOMY;  Surgeon: Meredith Pel, MD;  Location: [facility];  Service: Orthopedics;  Laterality: Right;    Current Outpatient Medications on File Prior to Visit  Medication Sig Dispense Refill  . acetaminophen (TYLENOL) 500 MG tablet Take 500-1,000 mg by mouth every 6 (six)  hours as needed (for pain.).    Marland Kitchen aspirin 81 MG chewable tablet Chew 81 mg by mouth daily.    . Blood Glucose Monitoring Suppl ([place]) w/Device KIT Use to test blood sugars one time daily. 1 kit 1  . glucose blood (ONETOUCH VERIO) test strip Use as instructed to test blood sugars one time daily. Dx Code E11.65 100 each 12  . metFORMIN (GLUCOPHAGE) 500 MG tablet TAKE 1 TABLET BY MOUTH IN THE MORNING AND TAKE 2 TABLETS IN THE EVENING 270 tablet 1  . ONE TOUCH LANCETS MISC One Touch Delica 19E lancets Check blood sugars daily Dx:E11.65 100 each 3  . pravastatin (PRAVACHOL) 80 MG tablet Take 1 tablet (80 mg total) by mouth daily. 90 tablet 1   No current facility-administered medications on file prior to visit.     Allergies  Allergen Reactions  . Antibacterial Hand Soap [Triclosan]     Water blisters    Family History  Problem Relation Age of Onset  . Hypertension Mother   . Dementia Mother        92s  . Transient ischemic attack Mother        5s  . Kidney disease Father   . COPD Father   . Hypertension Brother   . Alcohol abuse Maternal Grandfather   . Mental illness Paternal Grandmother   . Hypertension Daughter   . Healthy Son   . Learning disabilities Son   . Lung cancer Maternal Aunt   . Colon cancer Neg Hx   . Colon polyps  Neg Hx   . Esophageal cancer Neg Hx   . Rectal cancer Neg Hx   . Stomach cancer Neg Hx     Social History   Socioeconomic History  . Marital status: Married    Spouse name: Not on file  . Number of children: 3  . Years of education: Not on file  . Highest education level: Not on file  Occupational History  . Occupation: Retired    Comment: Manufacturing systems engineer  . Financial resource strain: Not on file  . Food insecurity:    Worry: Not on file    Inability: Not on file  . Transportation needs:    Medical: Not on file    Non-medical: Not on file  Tobacco Use  . Smoking status: Current Every Day Smoker     Packs/day: 1.00    Years: 50.00    Pack years: 50.00    Types: Cigarettes    Start date: 08/12/1966  . Smokeless tobacco: Never Used  Substance and Sexual Activity  . Alcohol use: No  . Drug use: No  . Sexual activity: Yes  Lifestyle  . Physical activity:    Days per week: Not on file    Minutes per session: Not on file  . Stress: Not on file  Relationships  . Social connections:    Talks on phone: Not on file    Gets together: Not on file    Attends religious service: Not on file    Active member of club or organization: Not on file    Attends meetings of clubs or organizations: Not on file    Relationship status: Not on file  . Intimate partner violence:    Fear of current or ex partner: Not on file    Emotionally abused: Not on file    Physically abused: Not on file    Forced sexual activity: Not on file  Other Topics Concern  . Not on file  Social History Narrative  . Not on file   Review of Systems  Constitutional: Negative for fever and weight loss.  HENT: Negative for ear discharge, ear pain, hearing loss and tinnitus.   Eyes: Negative for blurred vision, double vision, photophobia and pain.  Respiratory: Negative for cough and shortness of breath.   Cardiovascular: Negative for chest pain and palpitations.  Gastrointestinal: Negative for abdominal pain, blood in stool, constipation, diarrhea, heartburn, melena, nausea and vomiting.  Genitourinary: Negative for dysuria, flank pain, frequency, hematuria and urgency.       Nocturia -- 0-2 x during the night.   Musculoskeletal: Negative for falls.  Neurological: Negative for dizziness, loss of consciousness and headaches.  Endo/Heme/Allergies: Negative for environmental allergies.  Psychiatric/Behavioral: Negative for depression, hallucinations, substance abuse and suicidal ideas. The patient is not nervous/anxious and does not have insomnia.    BP 128/82   Pulse 65   Temp 97.8 F (36.6 C) (Oral)   Resp 16   Ht  '5\' 11"'  (1.803 m)   Wt 169 lb (76.7 kg)   SpO2 97%   BMI 23.57 kg/m   Physical Exam Vitals signs reviewed.  Constitutional:      General: He is not in acute distress.    Appearance: He is well-developed. He is not diaphoretic.  HENT:     Head: Normocephalic and atraumatic.     Right Ear: Tympanic membrane, ear canal and external ear normal.     Left Ear: Tympanic membrane, ear canal and external ear normal.  Nose: Nose normal.     Mouth/Throat:     Pharynx: No posterior oropharyngeal erythema.  Eyes:     Conjunctiva/sclera: Conjunctivae normal.     Pupils: Pupils are equal, round, and reactive to light.  Neck:     Musculoskeletal: Neck supple.     Thyroid: No thyromegaly.  Cardiovascular:     Rate and Rhythm: Normal rate and regular rhythm.     Heart sounds: Normal heart sounds.  Pulmonary:     Effort: Pulmonary effort is normal. No respiratory distress.     Breath sounds: Normal breath sounds. No wheezing or rales.  Chest:     Chest wall: No tenderness.  Abdominal:     General: Bowel sounds are normal. There is no distension.     Palpations: Abdomen is soft. There is no mass.     Tenderness: There is no abdominal tenderness. There is no guarding or rebound.  Lymphadenopathy:     Cervical: No cervical adenopathy.  Skin:    General: Skin is warm and dry.     Findings: No rash.  Neurological:     Mental Status: He is alert and oriented to person, place, and time.     Cranial Nerves: No cranial nerve deficit.    Diabetic Foot Form - Detailed   Diabetic Foot Exam - detailed Diabetic Foot exam was performed with the following findings:  Yes 02/07/2018  7:12 PM  Visual Foot Exam completed.:  Yes  Can the patient see the bottom of their feet?:  Yes Are the shoes appropriate in style and fit?:  Yes Is there swelling or and abnormal foot shape?:  No Is there a claw toe deformity?:  No Is there elevated skin temparature?:  No Is there foot or ankle muscle weakness?:   No Normal Range of Motion:  Yes Right posterior Tibialias:  Present Left posterior Tibialias:  Present  Right Dorsalis Pedis:  Diminished Left Dorsalis Pedis:  Diminished  Semmes-Weinstein Monofilament Test R Site 1-Great Toe:  Pos L Site 1-Great Toe:  Pos    Comments:  Violaceous coloration of distal extremities with coolness. Diminished DP pulses as noted.     Assessment/Plan: Visit for preventive health examination Depression screen negative. Health Maintenance reviewed -- Pneumovax and high-dose influenza given. Will update AAA and lung Ca Screens. Colonoscopy up-to-date. Preventive schedule discussed and handout given in AVS. Will obtain fasting labs today.   Tobacco abuse disorder Smoking cessation instruction/counseling given:  counseled patient on the dangers of tobacco use, advised patient to stop smoking, and reviewed strategies to maximize success. Patient would like to start with Nicoderm patches. Rx for the 21 mcg/hr patch written. Follow-up scheduled. Order for Lung CA screen CT placed.     Skin cancer screening Referral to Dermatology placed for annual comprehensive skin examinations and dermoscopy.  Screening for AAA (abdominal aortic aneurysm) Caucasian male, > 50, current smoker. No prior screen. Order for [place] placed.   Prostate cancer screening The natural history of prostate cancer and ongoing controversy regarding screening and potential treatment outcomes of prostate cancer has been discussed with the patient. The meaning of a false positive PSA and a false negative PSA has been discussed. He indicates understanding of the limitations of this screening test and wishes  to proceed with screening PSA testing.   Need for 23-polyvalent pneumococcal polysaccharide vaccine Pneumovax updated.  Encounter for screening for malignant neoplasm of respiratory organs Order for lung ca ct placed. Will compare to last years imaging.  Encounter for  immunization High-dose flu shot given.  Hyperlipidemia associated with type 2 diabetes mellitus ([place]) Taking medications as directed. Will check fasting lipids and LFTS today.   Essential hypertension BP stable. Asymptomatic. Repeat labs today. Continue current regimen.  Diabetes mellitus type II, uncontrolled ([place]) Foot exam updated. Nail care reviewed. Diminished DP pulses noted. Referral to [facility] placed for ABI and further management. Immunizations up-to-date now. Will obtain fasting labs.    Leeanne Rio, PA-C

## 2018-02-05 ENCOUNTER — Other Ambulatory Visit: Payer: Self-pay | Admitting: Physician Assistant

## 2018-02-05 DIAGNOSIS — I10 Essential (primary) hypertension: Secondary | ICD-10-CM

## 2018-02-07 DIAGNOSIS — Z122 Encounter for screening for malignant neoplasm of respiratory organs: Secondary | ICD-10-CM | POA: Insufficient documentation

## 2018-02-07 DIAGNOSIS — Z23 Encounter for immunization: Secondary | ICD-10-CM | POA: Insufficient documentation

## 2018-02-07 DIAGNOSIS — Z136 Encounter for screening for cardiovascular disorders: Secondary | ICD-10-CM | POA: Insufficient documentation

## 2018-02-07 DIAGNOSIS — Z0000 Encounter for general adult medical examination without abnormal findings: Secondary | ICD-10-CM | POA: Insufficient documentation

## 2018-02-07 DIAGNOSIS — Z1283 Encounter for screening for malignant neoplasm of skin: Secondary | ICD-10-CM | POA: Insufficient documentation

## 2018-02-07 NOTE — Assessment & Plan Note (Signed)
Smoking cessation instruction/counseling given:  counseled patient on the dangers of tobacco use, advised patient to stop smoking, and reviewed strategies to maximize success. Patient would like to start with Nicoderm patches. Rx for the 21 mcg/hr patch written. Follow-up scheduled. Order for Lung CA screen CT placed.

## 2018-02-07 NOTE — Assessment & Plan Note (Signed)
Pneumovax updated.

## 2018-02-07 NOTE — Assessment & Plan Note (Signed)
Referral to Dermatology placed for annual comprehensive skin examinations and dermoscopy.

## 2018-02-07 NOTE — Assessment & Plan Note (Signed)
High dose flu shot given

## 2018-02-07 NOTE — Assessment & Plan Note (Signed)
BP stable. Asymptomatic. Repeat labs today. Continue current regimen.

## 2018-02-07 NOTE — Assessment & Plan Note (Signed)
Depression screen negative. Health Maintenance reviewed -- Pneumovax and high-dose influenza given. Will update AAA and lung Ca Screens. Colonoscopy up-to-date. Preventive schedule discussed and handout given in AVS. Will obtain fasting labs today.

## 2018-02-07 NOTE — Assessment & Plan Note (Signed)
Taking medications as directed. Will check fasting lipids and LFTS today.

## 2018-02-07 NOTE — Assessment & Plan Note (Signed)
The natural history of prostate cancer and ongoing controversy regarding screening and potential treatment outcomes of prostate cancer has been discussed with the patient. The meaning of a false positive PSA and a false negative PSA has been discussed. He indicates understanding of the limitations of this screening test and wishes  to proceed with screening PSA testing.  

## 2018-02-07 NOTE — Assessment & Plan Note (Addendum)
Foot exam updated. Nail care reviewed. Diminished DP pulses noted. Referral to Vasc placed for ABI and further management. Immunizations up-to-date now. Will obtain fasting labs.

## 2018-02-07 NOTE — Assessment & Plan Note (Signed)
Order for lung ca ct placed. Will compare to last years imaging.

## 2018-02-07 NOTE — Assessment & Plan Note (Signed)
Caucasian male, > 50, current smoker. No prior screen. Order for [place] placed.

## 2018-02-22 ENCOUNTER — Other Ambulatory Visit: Payer: Self-pay | Admitting: Physician Assistant

## 2018-02-22 DIAGNOSIS — E785 Hyperlipidemia, unspecified: Principal | ICD-10-CM

## 2018-02-22 DIAGNOSIS — E1169 Type 2 diabetes mellitus with other specified complication: Secondary | ICD-10-CM

## 2018-03-10 ENCOUNTER — Ambulatory Visit
Admission: RE | Admit: 2018-03-10 | Discharge: 2018-03-10 | Disposition: A | Discharge status: Home / Self Care | Payer: Medicare HMO | Source: Ambulatory Visit | Attending: Physician Assistant | Admitting: Physician Assistant

## 2018-03-10 DIAGNOSIS — Z136 Encounter for screening for cardiovascular disorders: Secondary | ICD-10-CM

## 2018-03-10 DIAGNOSIS — Z87891 Personal history of nicotine dependence: Secondary | ICD-10-CM | POA: Diagnosis not present

## 2018-03-10 DIAGNOSIS — R69 Illness, unspecified: Secondary | ICD-10-CM | POA: Diagnosis not present

## 2018-03-10 DIAGNOSIS — Z122 Encounter for screening for malignant neoplasm of respiratory organs: Secondary | ICD-10-CM

## 2018-03-15 ENCOUNTER — Other Ambulatory Visit: Payer: Self-pay | Admitting: Physician Assistant

## 2018-03-15 DIAGNOSIS — J439 Emphysema, unspecified: Secondary | ICD-10-CM

## 2018-03-15 DIAGNOSIS — I700 Atherosclerosis of aorta: Secondary | ICD-10-CM

## 2018-03-15 DIAGNOSIS — I359 Nonrheumatic aortic valve disorder, unspecified: Secondary | ICD-10-CM

## 2018-03-18 ENCOUNTER — Other Ambulatory Visit (HOSPITAL_BASED_OUTPATIENT_CLINIC_OR_DEPARTMENT_OTHER): Payer: Medicare HMO

## 2018-03-24 ENCOUNTER — Ambulatory Visit (HOSPITAL_BASED_OUTPATIENT_CLINIC_OR_DEPARTMENT_OTHER)
Admission: RE | Admit: 2018-03-24 | Discharge: 2018-03-24 | Disposition: A | Discharge status: Home / Self Care | Payer: Medicare HMO | Source: Ambulatory Visit | Attending: Physician Assistant | Admitting: Physician Assistant

## 2018-03-24 DIAGNOSIS — I700 Atherosclerosis of aorta: Secondary | ICD-10-CM | POA: Diagnosis not present

## 2018-03-24 DIAGNOSIS — I359 Nonrheumatic aortic valve disorder, unspecified: Secondary | ICD-10-CM | POA: Insufficient documentation

## 2018-03-24 NOTE — Progress Notes (Signed)
  Echocardiogram 2D Echocardiogram has been performed.  John Woodward T John Woodward 03/24/2018, 1:49 PM

## 2018-04-13 ENCOUNTER — Institutional Professional Consult (permissible substitution): Payer: Medicare HMO | Admitting: Pulmonary Disease

## 2018-04-21 ENCOUNTER — Other Ambulatory Visit: Payer: Self-pay | Admitting: Physician Assistant

## 2018-05-03 ENCOUNTER — Telehealth: Payer: Self-pay | Admitting: Physician Assistant

## 2018-05-03 NOTE — Telephone Encounter (Signed)
Reviewing metrics. Patient is still showing due for his Diabetic Eye Examination. Please call patient to assess if he has had in the past 12 months. If so, with whom and where? Will need to obtain records. If not, please encourage to schedule. Ok to place referral if needed.  Thank you.

## 2018-05-03 NOTE — Telephone Encounter (Signed)
Spoke to patient. He has not had an eye examination in the past year. He does have an eye doctor currently. Recommended he call and schedule eye exam in the summer once the [place] pandemic is over.   Reminder placed in system to reassess this in late May/early June.

## 2018-05-23 ENCOUNTER — Other Ambulatory Visit: Payer: Self-pay | Admitting: Physician Assistant

## 2018-05-23 DIAGNOSIS — E785 Hyperlipidemia, unspecified: Principal | ICD-10-CM

## 2018-05-23 DIAGNOSIS — E1169 Type 2 diabetes mellitus with other specified complication: Secondary | ICD-10-CM

## 2018-06-05 ENCOUNTER — Other Ambulatory Visit: Payer: Self-pay | Admitting: Physician Assistant

## 2018-06-05 DIAGNOSIS — I10 Essential (primary) hypertension: Secondary | ICD-10-CM

## 2018-06-06 ENCOUNTER — Other Ambulatory Visit: Payer: Self-pay | Admitting: Physician Assistant

## 2018-06-06 DIAGNOSIS — I10 Essential (primary) hypertension: Secondary | ICD-10-CM

## 2018-06-06 MED ORDER — LOSARTAN POTASSIUM 50 MG PO TABS: 50.0000 mg | ORAL_TABLET | Freq: Every day | ORAL | 0 refills | Status: DC

## 2018-06-14 ENCOUNTER — Telehealth: Payer: Self-pay | Admitting: Physician Assistant

## 2018-06-14 NOTE — Telephone Encounter (Signed)
Pt wife is concerned with pt and him having memory loss and being forgetful. Wife stated that she thinks it could be his Rx pravastatin, she said she had taken this in the past and had issues with it. Wife would like a call back on her number listed and not to call pt, Wife was asking for an office visit with [facility], they are not able to do virtual visits. Please advise.

## 2018-06-14 NOTE — Telephone Encounter (Signed)
Called patient but VM was full unable to leave a message.  [place] for patient to schedule a in office visit with [employer]. As long as they are negative for Covid Screening.

## 2018-06-15 ENCOUNTER — Encounter: Payer: Self-pay | Admitting: Physician Assistant

## 2018-06-15 ENCOUNTER — Ambulatory Visit (INDEPENDENT_AMBULATORY_CARE_PROVIDER_SITE_OTHER): Payer: Medicare HMO | Admitting: Physician Assistant

## 2018-06-15 ENCOUNTER — Other Ambulatory Visit: Payer: Self-pay

## 2018-06-15 VITALS — BP 142/90 | HR 60 | Temp 98.0°F | Resp 16 | Ht 71.0 in | Wt 175.0 lb

## 2018-06-15 DIAGNOSIS — R413 Other amnesia: Secondary | ICD-10-CM

## 2018-06-15 LAB — CBC WITH DIFFERENTIAL/PLATELET
Basophils Absolute: 0 10*3/uL (ref 0.0–0.1)
Basophils Relative: 0.6 % (ref 0.0–3.0)
Eosinophils Absolute: 0.3 10*3/uL (ref 0.0–0.7)
Eosinophils Relative: 4.3 % (ref 0.0–5.0)
HCT: 45.5 % (ref 39.0–52.0)
Hemoglobin: 15.7 g/dL (ref 13.0–17.0)
Lymphocytes Relative: 22.9 % (ref 12.0–46.0)
Lymphs Abs: 1.4 10*3/uL (ref 0.7–4.0)
MCHC: 34.6 g/dL (ref 30.0–36.0)
MCV: 94.3 fl (ref 78.0–100.0)
Monocytes Absolute: 0.5 10*3/uL (ref 0.1–1.0)
Monocytes Relative: 7.6 % (ref 3.0–12.0)
Neutro Abs: 4.1 10*3/uL (ref 1.4–7.7)
Neutrophils Relative %: 64.6 % (ref 43.0–77.0)
Platelets: 265 10*3/uL (ref 150.0–400.0)
RBC: 4.82 Mil/uL (ref 4.22–5.81)
RDW: 13.5 % (ref 11.5–15.5)
WBC: 6.3 10*3/uL (ref 4.0–10.5)

## 2018-06-15 LAB — TSH: TSH: 3.32 u[IU]/mL (ref 0.35–4.50)

## 2018-06-15 LAB — VITAMIN B12: Vitamin B-12: 676 pg/mL (ref 211–911)

## 2018-06-15 NOTE — Progress Notes (Signed)
Patient and wife present to clinic today c/o memory changes. Notes this has been occurring over the past year but very mild, with change in symptoms over the past month. Notes patient is more forgetful and oftentimes misplaces items. Sometimes cannot remember simple details about conversations. Denies getting lost. Denies forgetting people. Denies issue with focus. Sleeping and eating well. Denies change to medications. Denies depressed mood, anxiety or SI/HI. Is on statin medication which wife notes caused some memory/focus issues for her. Is wanting to know if he can be off of the medication for a trial period to see if this helps.   Past Medical History:  Diagnosis Date  . Arthritis   . Closed fracture dislocation of right elbow    30 years ago -- fell out of theback of a truck  . Diabetes mellitus without complication ([place])   . Hx of colonic polyps 02/23/2017  . Hyperlipidemia   . Hypertension    Diagnosed 10 years ago. No on medication. Denies history of CAD.     Current Outpatient Medications on File Prior to Visit  Medication Sig Dispense Refill  . acetaminophen (TYLENOL) 500 MG tablet Take 500-1,000 mg by mouth every 6 (six) hours as needed (for pain.).    Marland Kitchen aspirin 81 MG chewable tablet Chew 81 mg by mouth daily.    . Blood Glucose Monitoring Suppl ([place]) w/Device KIT Use to test blood sugars one time daily. 1 kit 1  . glucose blood (ONETOUCH VERIO) test strip Use as instructed to test blood sugars one time daily. Dx Code E11.65 100 each 12  . losartan (COZAAR) 50 MG tablet Take 1 tablet (50 mg total) by mouth daily. 90 tablet 0  . metFORMIN (GLUCOPHAGE) 500 MG tablet TAKE 1 TABLET BY MOUTH IN THE MORNING AND 2 TABLETS IN THE EVENING 270 tablet 0  . nicotine (NICODERM CQ) 21 mg/24hr patch Place 1 patch (21 mg total) onto the skin daily. 28 patch 0  . ONE TOUCH LANCETS MISC One Touch Delica 30S lancets Check blood sugars daily Dx:E11.65 100 each 3  .  pravastatin (PRAVACHOL) 80 MG tablet Take 1 tablet by mouth once daily 90 tablet 0   No current facility-administered medications on file prior to visit.     Allergies  Allergen Reactions  . Antibacterial Hand Soap [Triclosan]     Water blisters    Family History  Problem Relation Age of Onset  . Hypertension Mother   . Dementia Mother        52s  . Transient ischemic attack Mother        54s  . Kidney disease Father   . COPD Father   . Hypertension Brother   . Alcohol abuse Maternal Grandfather   . Mental illness Paternal Grandmother   . Hypertension Daughter   . Healthy Son   . Learning disabilities Son   . Lung cancer Maternal Aunt   . Colon cancer Neg Hx   . Colon polyps Neg Hx   . Esophageal cancer Neg Hx   . Rectal cancer Neg Hx   . Stomach cancer Neg Hx     Social History   Socioeconomic History  . Marital status: Married    Spouse name: Not on file  . Number of children: 3  . Years of education: Not on file  . Highest education level: Not on file  Occupational History  . Occupation: Retired    Comment: Manufacturing systems engineer  . Financial  resource strain: Not on file  . Food insecurity:    Worry: Not on file    Inability: Not on file  . Transportation needs:    Medical: Not on file    Non-medical: Not on file  Tobacco Use  . Smoking status: Current Every Day Smoker    Packs/day: 1.00    Years: 50.00    Pack years: 50.00    Types: Cigarettes    Start date: 08/12/1966  . Smokeless tobacco: Never Used  Substance and Sexual Activity  . Alcohol use: No  . Drug use: No  . Sexual activity: Yes  Lifestyle  . Physical activity:    Days per week: Not on file    Minutes per session: Not on file  . Stress: Not on file  Relationships  . Social connections:    Talks on phone: Not on file    Gets together: Not on file    Attends religious service: Not on file    Active member of club or organization: Not on file    Attends meetings of clubs or  organizations: Not on file    Relationship status: Not on file  Other Topics Concern  . Not on file  Social History Narrative  . Not on file   Review of Systems - See HPI.  All other ROS are negative.  BP (!) 142/90   Pulse 60   Temp 98 F (36.7 C) (Skin)   Resp 16   Ht '5\' 11"'  (1.803 m)   Wt 175 lb (79.4 kg)   SpO2 93%   BMI 24.41 kg/m   Physical Exam Vitals signs reviewed.  Constitutional:      Appearance: Normal appearance.  HENT:     Head: Normocephalic and atraumatic.     Right Ear: Tympanic membrane normal.     Left Ear: Tympanic membrane normal.     Nose: Nose normal.     Mouth/Throat:     Mouth: Mucous membranes are moist.  Eyes:     Conjunctiva/sclera: Conjunctivae normal.  Neck:     Musculoskeletal: Neck supple.  Cardiovascular:     Rate and Rhythm: Normal rate and regular rhythm.     Pulses: Normal pulses.     Heart sounds: Normal heart sounds.  Pulmonary:     Effort: Pulmonary effort is normal.     Breath sounds: Normal breath sounds.  Neurological:     General: No focal deficit present.     Mental Status: He is alert and oriented to person, place, and time.     Cranial Nerves: No cranial nerve deficit.     Motor: No weakness.     Coordination: Coordination normal.     Gait: Gait normal.  Psychiatric:        Mood and Affect: Mood normal.    MMSE - Mini Mental State Exam 06/15/2018  Orientation to time 4  Orientation to Place 5  Registration 3  Attention/ Calculation 3  Recall 2  Language- name 2 objects 2  Language- repeat 1  Language- follow 3 step command 3  Language- read & follow direction 1  Write a sentence 1  Copy design 1  Total score 26   Assessment/Plan: 1. Memory changes MMSE with score of 26/30. Mild cognitive impairment noted. Patient and wife are concerned that statin may be contributing. Discussed ok to hold statin for 2 weeks, keeping low fat, HH diet. Will check CBC, TSH and b12 today. Mother with late-onset dementia,  thought to  be vascular due to TIAs. No other family history of dementia.  We will see how labs look and how he does with holding statin. May need to consider starting Aricept and Neurology assessment if things are not improving.  - CBC w/Diff - TSH - B12   Leeanne Rio, PA-C

## 2018-06-15 NOTE — Telephone Encounter (Signed)
Tried to call wife this morning, no answer and was not able to leave message due to MB full.

## 2018-06-15 NOTE — Patient Instructions (Signed)
Please go to the lab today for blood work.  I will call you with your results. We will alter treatment regimen(s) if indicated by your results.   Hold the Pravachol for 2 weeks, watching your diet during this time! We will see what labs say and if the change in medication makes an appreciable difference.  Follow-up with me in 2 weeks for reassessment. This can be in-office or via a phone appointment.

## 2018-06-21 ENCOUNTER — Encounter: Payer: Self-pay | Admitting: Physician Assistant

## 2018-06-21 ENCOUNTER — Ambulatory Visit (INDEPENDENT_AMBULATORY_CARE_PROVIDER_SITE_OTHER): Payer: Medicare HMO | Admitting: Physician Assistant

## 2018-06-21 ENCOUNTER — Telehealth: Payer: Self-pay | Admitting: Physician Assistant

## 2018-06-21 ENCOUNTER — Other Ambulatory Visit: Payer: Self-pay

## 2018-06-21 VITALS — BP 160/90 | HR 67 | Temp 98.3°F | Resp 16 | Ht 71.0 in | Wt 178.0 lb

## 2018-06-21 DIAGNOSIS — R413 Other amnesia: Secondary | ICD-10-CM | POA: Diagnosis not present

## 2018-06-21 LAB — POCT URINALYSIS DIPSTICK
Bilirubin, UA: NEGATIVE
Blood, UA: NEGATIVE
Glucose, UA: NEGATIVE
Ketones, UA: NEGATIVE
Leukocytes, UA: NEGATIVE
Nitrite, UA: NEGATIVE
Protein, UA: POSITIVE — AB
Spec Grav, UA: 1.015 (ref 1.010–1.025)
Urobilinogen, UA: 0.2 E.U./dL
pH, UA: 6.5 (ref 5.0–8.0)

## 2018-06-21 NOTE — Telephone Encounter (Signed)
Pt wife called in stating that pt is having some memory issues. She states that he could have a UTI wanted to come in for a UA and talk about this with [place]. She said she needs him seen in the office not a virtual visit. Can we set up an in office visit? Please call  [phone]

## 2018-06-21 NOTE — Patient Instructions (Signed)
Please keep well-hydrated and get plenty of rest. I will call you with your lab results. We are getting you scheduled for a CT ASAP. This will be done as a walk in once approved so you can go any time.  My staff will give you further instructions once we get approval from insurance.  If anything is acutely worsening, please go to the ER.  For blood pressure, limit salt intake. Keep hydrated. Check BP home over the next few days and record. If > 150/90 take another 1/2 tablet of the losartan. We will call Thursday to check on BP readings.

## 2018-06-21 NOTE — Progress Notes (Signed)
Patient presents to clinic today with wife c/o further decline in memory since visit last week. States that he had an episode of forgetting where he was and what he was doing a few days ago. Was able to recollect after a couple of minutes but this scared him. Wife notes he seems to take longer with his routine tasks and gets distracted very easily. He states this is because he feels he cannot remember things clearly and is second guessing if he has already completed some tasks. Workup at last visit included exam, labs including CBC, TSH, B12, that were all unremarkable. Wife was concerned that statin was causing symptoms so this was held for a two week trial. Has noted memory worse despite this. As last time he again denies depressed mood or anhedonia. Denies anxiety, irritability or increased stressors. Wife notes no significant change in personality.   Past Medical History:  Diagnosis Date  . Arthritis   . Closed fracture dislocation of right elbow    30 years ago -- fell out of theback of a truck  . Diabetes mellitus without complication ([place])   . Hx of colonic polyps 02/23/2017  . Hyperlipidemia   . Hypertension    Diagnosed 10 years ago. No on medication. Denies history of CAD.     Current Outpatient Medications on File Prior to Visit  Medication Sig Dispense Refill  . acetaminophen (TYLENOL) 500 MG tablet Take 500-1,000 mg by mouth every 6 (six) hours as needed (for pain.).    Marland Kitchen aspirin 81 MG chewable tablet Chew 81 mg by mouth daily.    . Blood Glucose Monitoring Suppl ([place]) w/Device KIT Use to test blood sugars one time daily. 1 kit 1  . glucose blood (ONETOUCH VERIO) test strip Use as instructed to test blood sugars one time daily. Dx Code E11.65 100 each 12  . losartan (COZAAR) 50 MG tablet Take 1 tablet (50 mg total) by mouth daily. 90 tablet 0  . metFORMIN (GLUCOPHAGE) 500 MG tablet TAKE 1 TABLET BY MOUTH IN THE MORNING AND 2 TABLETS IN THE EVENING 270  tablet 0  . nicotine (NICODERM CQ) 21 mg/24hr patch Place 1 patch (21 mg total) onto the skin daily. 28 patch 0  . ONE TOUCH LANCETS MISC One Touch Delica 37S lancets Check blood sugars daily Dx:E11.65 100 each 3  . pravastatin (PRAVACHOL) 80 MG tablet Take 1 tablet by mouth once daily (Patient not taking: Reported on 06/21/2018) 90 tablet 0   No current facility-administered medications on file prior to visit.     Allergies  Allergen Reactions  . Antibacterial Hand Soap [Triclosan]     Water blisters    Family History  Problem Relation Age of Onset  . Hypertension Mother   . Dementia Mother        79s  . Transient ischemic attack Mother        17s  . Kidney disease Father   . COPD Father   . Hypertension Brother   . Alcohol abuse Maternal Grandfather   . Mental illness Paternal Grandmother   . Hypertension Daughter   . Healthy Son   . Learning disabilities Son   . Lung cancer Maternal Aunt   . Colon cancer Neg Hx   . Colon polyps Neg Hx   . Esophageal cancer Neg Hx   . Rectal cancer Neg Hx   . Stomach cancer Neg Hx     Social History   Socioeconomic History  .  Marital status: Married    Spouse name: Not on file  . Number of children: 3  . Years of education: Not on file  . Highest education level: Not on file  Occupational History  . Occupation: Retired    Comment: Manufacturing systems engineer  . Financial resource strain: Not on file  . Food insecurity:    Worry: Not on file    Inability: Not on file  . Transportation needs:    Medical: Not on file    Non-medical: Not on file  Tobacco Use  . Smoking status: Current Every Day Smoker    Packs/day: 1.00    Years: 50.00    Pack years: 50.00    Types: Cigarettes    Start date: 08/12/1966  . Smokeless tobacco: Never Used  Substance and Sexual Activity  . Alcohol use: No  . Drug use: No  . Sexual activity: Yes  Lifestyle  . Physical activity:    Days per week: Not on file    Minutes per session: Not on file   . Stress: Not on file  Relationships  . Social connections:    Talks on phone: Not on file    Gets together: Not on file    Attends religious service: Not on file    Active member of club or organization: Not on file    Attends meetings of clubs or organizations: Not on file    Relationship status: Not on file  Other Topics Concern  . Not on file  Social History Narrative  . Not on file    Review of Systems - See HPI.  All other ROS are negative.  BP (!) 160/90   Pulse 67   Temp 98.3 F (36.8 C) (Skin)   Resp 16   Ht '5\' 11"'  (1.803 m)   Wt 178 lb (80.7 kg)   SpO2 98%   BMI 24.83 kg/m   Physical Exam Vitals signs reviewed.  Constitutional:      Appearance: Normal appearance.  HENT:     Head: Normocephalic and atraumatic.     Right Ear: Tympanic membrane normal.     Left Ear: Tympanic membrane normal.     Nose: Nose normal.     Mouth/Throat:     Mouth: Mucous membranes are moist.  Eyes:     Conjunctiva/sclera: Conjunctivae normal.  Neck:     Musculoskeletal: Neck supple.  Cardiovascular:     Rate and Rhythm: Normal rate and regular rhythm.     Pulses: Normal pulses.     Heart sounds: Normal heart sounds.  Pulmonary:     Effort: Pulmonary effort is normal.  Neurological:     General: No focal deficit present.     Mental Status: He is alert.     GCS: GCS eye subscore is 4. GCS verbal subscore is 5. GCS motor subscore is 6.     Cranial Nerves: No cranial nerve deficit.     Sensory: No sensory deficit.     Motor: No weakness or tremor.     Coordination: Romberg sign negative. Coordination normal. Finger-Nose-Finger Test and Heel to [facility] Test normal. Rapid alternating movements normal.     Gait: Gait normal.     Deep Tendon Reflexes: Reflexes normal.     Reflex Scores:      Patellar reflexes are 2+ on the right side and 2+ on the left side.    Recent Results (from the past 2160 hour(s))  CBC w/Diff     Status:  None   Collection Time: 06/15/18 11:44 AM   Result Value Ref Range   WBC 6.3 4.0 - 10.5 K/uL   RBC 4.82 4.22 - 5.81 Mil/uL   Hemoglobin 15.7 13.0 - 17.0 g/dL   HCT 45.5 39.0 - 52.0 %   MCV 94.3 78.0 - 100.0 fl   MCHC 34.6 30.0 - 36.0 g/dL   RDW 13.5 11.5 - 15.5 %   Platelets 265.0 150.0 - 400.0 K/uL   Neutrophils Relative % 64.6 43.0 - 77.0 %   Lymphocytes Relative 22.9 12.0 - 46.0 %   Monocytes Relative 7.6 3.0 - 12.0 %   Eosinophils Relative 4.3 0.0 - 5.0 %   Basophils Relative 0.6 0.0 - 3.0 %   Neutro Abs 4.1 1.4 - 7.7 K/uL   Lymphs Abs 1.4 0.7 - 4.0 K/uL   Monocytes Absolute 0.5 0.1 - 1.0 K/uL   Eosinophils Absolute 0.3 0.0 - 0.7 K/uL   Basophils Absolute 0.0 0.0 - 0.1 K/uL  TSH     Status: None   Collection Time: 06/15/18 11:44 AM  Result Value Ref Range   TSH 3.32 0.35 - 4.50 uIU/mL  B12     Status: None   Collection Time: 06/15/18 11:44 AM  Result Value Ref Range   Vitamin B-12 676 211 - 911 pg/mL    Assessment/Plan: 1. Memory changes Deteriorating. He is alert and oriented to person place and time (remembers date, day, month, season but not year). Mood within normal limits. BP elevated today but has had a lot of salt. Will tweak regimen for this. Urine dip unremarkable. Urine culture sent. Will also check CMP, RPR today. CT Head ordered stat due to how quickly changes have occurred for him. Need mass, other acute causes ruled out. Will set up with Neurology as scheduled.  - POCT Urinalysis Dipstick - Urine Culture - RPR - CT Head Wo Contrast; Future - Comp Met (CMET)   Leeanne Rio, PA-C

## 2018-06-21 NOTE — Telephone Encounter (Signed)
Ok to schedule for OV. We can also just have him come in for a UA and culture since he was just seen. Is he having any urinary symptoms at all?

## 2018-06-21 NOTE — Telephone Encounter (Signed)
Please advise. Patient was seen 06/15/18 for memory changes.

## 2018-06-21 NOTE — Telephone Encounter (Signed)
Spoke with patient wife about he can come into the office to give a u/a to rule out infection. She wanted a in office visit to discuss deteriorating symptoms. She is upset and unsure why symptoms are progressing so quickly. Scheduled appt today

## 2018-06-22 ENCOUNTER — Telehealth: Payer: Self-pay | Admitting: Physician Assistant

## 2018-06-22 ENCOUNTER — Ambulatory Visit
Admission: RE | Admit: 2018-06-22 | Discharge: 2018-06-22 | Disposition: A | Discharge status: Home / Self Care | Payer: Medicare HMO | Source: Ambulatory Visit | Attending: Physician Assistant | Admitting: Physician Assistant

## 2018-06-22 DIAGNOSIS — R413 Other amnesia: Secondary | ICD-10-CM | POA: Diagnosis not present

## 2018-06-22 LAB — COMPREHENSIVE METABOLIC PANEL
AG Ratio: 1.4 (calc) (ref 1.0–2.5)
ALT: 11 U/L (ref 9–46)
AST: 16 U/L (ref 10–35)
Albumin: 4.3 g/dL (ref 3.6–5.1)
Alkaline phosphatase (APISO): 64 U/L (ref 35–144)
BUN/Creatinine Ratio: 18 (calc) (ref 6–22)
BUN: 21 mg/dL (ref 7–25)
CO2: 30 mmol/L (ref 20–32)
Calcium: 9.2 mg/dL (ref 8.6–10.3)
Chloride: 99 mmol/L (ref 98–110)
Creat: 1.19 mg/dL — ABNORMAL HIGH (ref 0.70–1.18)
Globulin: 3 g/dL (calc) (ref 1.9–3.7)
Glucose, Bld: 56 mg/dL — ABNORMAL LOW (ref 65–99)
Potassium: 3.8 mmol/L (ref 3.5–5.3)
Sodium: 140 mmol/L (ref 135–146)
Total Bilirubin: 0.3 mg/dL (ref 0.2–1.2)
Total Protein: 7.3 g/dL (ref 6.1–8.1)

## 2018-06-22 NOTE — Telephone Encounter (Signed)
Disregard it has been approved and I will call to schedule the appt with GI

## 2018-06-22 NOTE — Telephone Encounter (Signed)
Would you like pt to keep appt on 6/4 or reschedule?

## 2018-06-22 NOTE — Telephone Encounter (Signed)
Keep appt

## 2018-06-22 NOTE — Telephone Encounter (Signed)
In office?

## 2018-06-22 NOTE — Telephone Encounter (Signed)
Please call back its about the PA for the CT    Copied from [place] [id number]. Topic: General - Other >> Jun 21, 2018  4:54 PM Sheran Luz wrote: Reason for CRM: [place] with [facility] requesting call back regarding CT authorization for pt.

## 2018-06-23 ENCOUNTER — Other Ambulatory Visit: Payer: Self-pay | Admitting: Physician Assistant

## 2018-06-23 DIAGNOSIS — R413 Other amnesia: Secondary | ICD-10-CM

## 2018-06-23 LAB — URINE CULTURE
MICRO NUMBER:: 509720
Result:: NO GROWTH
SPECIMEN QUALITY:: ADEQUATE

## 2018-06-23 LAB — RPR: RPR Ser Ql: NONREACTIVE

## 2018-06-30 ENCOUNTER — Other Ambulatory Visit: Payer: Self-pay

## 2018-06-30 ENCOUNTER — Other Ambulatory Visit: Payer: Self-pay | Admitting: Physician Assistant

## 2018-06-30 ENCOUNTER — Encounter: Payer: Self-pay | Admitting: Physician Assistant

## 2018-06-30 ENCOUNTER — Ambulatory Visit (INDEPENDENT_AMBULATORY_CARE_PROVIDER_SITE_OTHER): Payer: Medicare HMO | Admitting: Physician Assistant

## 2018-06-30 VITALS — BP 160/86 | HR 65 | Temp 98.1°F | Resp 16 | Ht 71.0 in | Wt 175.0 lb

## 2018-06-30 DIAGNOSIS — R413 Other amnesia: Secondary | ICD-10-CM | POA: Diagnosis not present

## 2018-06-30 DIAGNOSIS — I10 Essential (primary) hypertension: Secondary | ICD-10-CM | POA: Diagnosis not present

## 2018-06-30 DIAGNOSIS — E1165 Type 2 diabetes mellitus with hyperglycemia: Secondary | ICD-10-CM | POA: Diagnosis not present

## 2018-06-30 DIAGNOSIS — E1169 Type 2 diabetes mellitus with other specified complication: Secondary | ICD-10-CM | POA: Diagnosis not present

## 2018-06-30 DIAGNOSIS — E785 Hyperlipidemia, unspecified: Secondary | ICD-10-CM

## 2018-06-30 MED ORDER — LOSARTAN POTASSIUM 100 MG PO TABS: 100.0000 mg | ORAL_TABLET | Freq: Every day | ORAL | 3 refills | Status: DC

## 2018-06-30 NOTE — Progress Notes (Signed)
Patient presents to clinic today with wife, following up on memory loss.  Patient has appointment pending with neurology on 07/20/2018.  Has had significant work-up thus far including multiple labs with unremarkable results.  Also had CT head without concerning findings.  Currently his statin is on hold as there was concern this would be contributing.  At last visit patient was noting worsening memory.  Since last visit patient and wife notes that memory has improved somewhat.  Is able to stay on task much better.  Is not misplacing items or forgetting names as much.  They deny any new or worsening symptoms.  Of note blood pressure was elevated at last visit despite patient being on losartan 50 mg daily.  Blood pressure remains elevated at this visit despite patient working hard on low-salt diet. Patient denies chest pain, palpitations, lightheadedness, dizziness, vision changes or frequent headaches.  BP Readings from Last 3 Encounters:  06/30/18 (!) 160/86  06/21/18 (!) 160/90  06/15/18 (!) 142/90     Past Medical History:  Diagnosis Date  . Arthritis   . Closed fracture dislocation of right elbow    30 years ago -- fell out of theback of a truck  . Diabetes mellitus without complication ([place])   . Hx of colonic polyps 02/23/2017  . Hyperlipidemia   . Hypertension    Diagnosed 10 years ago. No on medication. Denies history of CAD.     Current Outpatient Medications on File Prior to Visit  Medication Sig Dispense Refill  . acetaminophen (TYLENOL) 500 MG tablet Take 500-1,000 mg by mouth every 6 (six) hours as needed (for pain.).    Marland Kitchen aspirin 81 MG chewable tablet Chew 81 mg by mouth daily.    . Blood Glucose Monitoring Suppl ([place]) w/Device KIT Use to test blood sugars one time daily. 1 kit 1  . glucose blood (ONETOUCH VERIO) test strip Use as instructed to test blood sugars one time daily. Dx Code E11.65 100 each 12  . losartan (COZAAR) 50 MG tablet Take 1 tablet  (50 mg total) by mouth daily. 90 tablet 0  . metFORMIN (GLUCOPHAGE) 500 MG tablet TAKE 1 TABLET BY MOUTH IN THE MORNING AND 2 TABLETS IN THE EVENING 270 tablet 0  . nicotine (NICODERM CQ) 21 mg/24hr patch Place 1 patch (21 mg total) onto the skin daily. 28 patch 0  . ONE TOUCH LANCETS MISC One Touch Delica 41O lancets Check blood sugars daily Dx:E11.65 100 each 3  . pravastatin (PRAVACHOL) 80 MG tablet Take 1 tablet by mouth once daily (Patient not taking: Reported on 06/30/2018) 90 tablet 0   No current facility-administered medications on file prior to visit.     Allergies  Allergen Reactions  . Antibacterial Hand Soap [Triclosan]     Water blisters    Family History  Problem Relation Age of Onset  . Hypertension Mother   . Dementia Mother        68s  . Transient ischemic attack Mother        78s  . Kidney disease Father   . COPD Father   . Hypertension Brother   . Alcohol abuse Maternal Grandfather   . Mental illness Paternal Grandmother   . Hypertension Daughter   . Healthy Son   . Learning disabilities Son   . Lung cancer Maternal Aunt   . Colon cancer Neg Hx   . Colon polyps Neg Hx   . Esophageal cancer Neg Hx   .  Rectal cancer Neg Hx   . Stomach cancer Neg Hx     Social History   Socioeconomic History  . Marital status: Married    Spouse name: Not on file  . Number of children: 3  . Years of education: Not on file  . Highest education level: Not on file  Occupational History  . Occupation: Retired    Comment: Manufacturing systems engineer  . Financial resource strain: Not on file  . Food insecurity:    Worry: Not on file    Inability: Not on file  . Transportation needs:    Medical: Not on file    Non-medical: Not on file  Tobacco Use  . Smoking status: Current Every Day Smoker    Packs/day: 1.00    Years: 50.00    Pack years: 50.00    Types: Cigarettes    Start date: 08/12/1966  . Smokeless tobacco: Never Used  Substance and Sexual Activity  . Alcohol  use: No  . Drug use: No  . Sexual activity: Yes  Lifestyle  . Physical activity:    Days per week: Not on file    Minutes per session: Not on file  . Stress: Not on file  Relationships  . Social connections:    Talks on phone: Not on file    Gets together: Not on file    Attends religious service: Not on file    Active member of club or organization: Not on file    Attends meetings of clubs or organizations: Not on file    Relationship status: Not on file  Other Topics Concern  . Not on file  Social History Narrative  . Not on file   Review of Systems - See HPI.  All other ROS are negative.  BP (!) 160/86   Pulse 65   Temp 98.1 F (36.7 C)   Resp 16   Ht _0  (1.803 m)   Wt 175 lb (79.4 kg)   SpO2 92%   BMI 24.41 kg/m   Physical Exam Constitutional:      Appearance: Normal appearance.  HENT:     Head: Normocephalic and atraumatic.     Right Ear: Tympanic membrane normal.     Left Ear: Tympanic membrane normal.     Mouth/Throat:     Mouth: Mucous membranes are moist.  Eyes:     Conjunctiva/sclera: Conjunctivae normal.  Cardiovascular:     Rate and Rhythm: Normal rate and regular rhythm.     Pulses: Normal pulses.     Heart sounds: Normal heart sounds.  Pulmonary:     Effort: Pulmonary effort is normal.     Breath sounds: Normal breath sounds.  Neurological:     General: No focal deficit present.     Mental Status: He is alert and oriented to person, place, and time.  Psychiatric:        Mood and Affect: Mood normal.     Recent Results (from the past 2160 hour(s))  CBC w/Diff     Status: None   Collection Time: 06/15/18 11:44 AM  Result Value Ref Range   WBC 6.3 4.0 - 10.5 K/uL   RBC 4.82 4.22 - 5.81 Mil/uL   Hemoglobin 15.7 13.0 - 17.0 g/dL   HCT 45.5 39.0 - 52.0 %   MCV 94.3 78.0 - 100.0 fl   MCHC 34.6 30.0 - 36.0 g/dL   RDW 13.5 11.5 - 15.5 %   Platelets 265.0 150.0 - 400.0 K/uL  Neutrophils Relative % 64.6 43.0 - 77.0 %   Lymphocytes  Relative 22.9 12.0 - 46.0 %   Monocytes Relative 7.6 3.0 - 12.0 %   Eosinophils Relative 4.3 0.0 - 5.0 %   Basophils Relative 0.6 0.0 - 3.0 %   Neutro Abs 4.1 1.4 - 7.7 K/uL   Lymphs Abs 1.4 0.7 - 4.0 K/uL   Monocytes Absolute 0.5 0.1 - 1.0 K/uL   Eosinophils Absolute 0.3 0.0 - 0.7 K/uL   Basophils Absolute 0.0 0.0 - 0.1 K/uL  TSH     Status: None   Collection Time: 06/15/18 11:44 AM  Result Value Ref Range   TSH 3.32 0.35 - 4.50 uIU/mL  B12     Status: None   Collection Time: 06/15/18 11:44 AM  Result Value Ref Range   Vitamin B-12 676 211 - 911 pg/mL  POCT Urinalysis Dipstick     Status: Abnormal   Collection Time: 06/21/18  2:32 PM  Result Value Ref Range   Color, UA yellow    Clarity, UA clear    Glucose, UA Negative Negative   Bilirubin, UA negative    Ketones, UA negative    Spec Grav, UA 1.015 1.010 - 1.025   Blood, UA negative    pH, UA 6.5 5.0 - 8.0   Protein, UA Positive (A) Negative   Urobilinogen, UA 0.2 0.2 or 1.0 E.U./dL   Nitrite, UA negative    Leukocytes, UA Negative Negative   Appearance     Odor    Urine Culture     Status: None   Collection Time: 06/21/18  2:36 PM  Result Value Ref Range   MICRO NUMBER: [id number]    SPECIMEN QUALITY: Adequate    Sample Source NOT GIVEN    STATUS: FINAL    Result: No Growth   RPR     Status: None   Collection Time: 06/21/18  2:36 PM  Result Value Ref Range   RPR Ser Ql NON-REACTIVE NON-REACTI  Comp Met (CMET)     Status: Abnormal   Collection Time: 06/21/18  2:38 PM  Result Value Ref Range   Glucose, Bld 56 (L) 65 - 99 mg/dL    Comment: .            Fasting reference interval .    BUN 21 7 - 25 mg/dL   Creat 1.19 (H) 0.70 - 1.18 mg/dL    Comment: For patients >19 years of age, the reference limit for Creatinine is approximately 13% higher for people identified as African-American. .    BUN/Creatinine Ratio 18 6 - 22 (calc)   Sodium 140 135 - 146 mmol/L   Potassium 3.8 3.5 - 5.3 mmol/L   Chloride 99 98  - 110 mmol/L   CO2 30 20 - 32 mmol/L   Calcium 9.2 8.6 - 10.3 mg/dL   Total Protein 7.3 6.1 - 8.1 g/dL   Albumin 4.3 3.6 - 5.1 g/dL   Globulin 3.0 1.9 - 3.7 g/dL (calc)   AG Ratio 1.4 1.0 - 2.5 (calc)   Total Bilirubin 0.3 0.2 - 1.2 mg/dL   Alkaline phosphatase (APISO) 64 35 - 144 U/L   AST 16 10 - 35 U/L   ALT 11 9 - 46 U/L    Assessment/Plan: 1. Essential hypertension Remains hypertensive despite current medication regimen.  Asymptomatic.  Will increase losartan to 100 mg daily.  Continue DASH diet.  Follow-up in 2 weeks for nurse visit to recheck blood pressure.  Will obtain  the following labs at that time. - Comp Met (CMET); Future - Lipid Profile; Future - Hemoglobin A1c; Future  2. Memory loss We will continue to hold statin for now.  Unclear if this is contributing to his issue.  Otherwise work-up thus far has been unremarkable.  He is scheduled to see neurology in a couple of weeks.  I urged him to Keep this appointment for further assessment and management.  Strict return precautions reviewed with patient and wife, who voiced understanding and agreement with plan.  3. Hyperlipidemia associated with type 2 diabetes mellitus ([place]) - Lipid Profile; Future - Hemoglobin A1c; Future 4. Uncontrolled type 2 diabetes mellitus with hyperglycemia ([facility]) - Hemoglobin A1c; Future   Leeanne Rio, PA-C

## 2018-06-30 NOTE — Patient Instructions (Signed)
Please keep hydrated and get plenty of rest. Increase the losartan to 2 tablets (100 mg) once daily.  Follow-up in 2 weeks for a nurse visit to recheck your blood pressure and your kidney function at that time.  The staff up front will help you schedule this!  Follow-up with the neurologist as scheduled for further assessment. Let me know of any changes that you note in the mean time.

## 2018-06-30 NOTE — Progress Notes (Signed)
Pre visit review using our clinic review tool, if applicable. No additional management support is needed unless otherwise documented below in the visit note. 

## 2018-07-13 ENCOUNTER — Telehealth: Payer: Self-pay | Admitting: Emergency Medicine

## 2018-07-13 NOTE — Telephone Encounter (Signed)
Called patient advised to remind patient to set up eye exam appointment for updating of eye exam. He states his wife will get it scheduled.

## 2018-07-13 NOTE — Telephone Encounter (Signed)
-----   Message from Brunetta Jeans, PA-C sent at 07/07/2018  8:11 AM EDT ----- Please call patient to remind him to schedule eye examination. Thank you.  ----- Message ----- From: Delorse Limber Sent: 05/03/2018   3:02 PM EDT To: Brunetta Jeans, PA-C  Eye Examination.

## 2018-07-14 ENCOUNTER — Ambulatory Visit (INDEPENDENT_AMBULATORY_CARE_PROVIDER_SITE_OTHER): Payer: Medicare HMO

## 2018-07-14 ENCOUNTER — Other Ambulatory Visit: Payer: Self-pay

## 2018-07-14 DIAGNOSIS — E1169 Type 2 diabetes mellitus with other specified complication: Secondary | ICD-10-CM

## 2018-07-14 DIAGNOSIS — E785 Hyperlipidemia, unspecified: Secondary | ICD-10-CM | POA: Diagnosis not present

## 2018-07-14 DIAGNOSIS — I10 Essential (primary) hypertension: Secondary | ICD-10-CM

## 2018-07-14 DIAGNOSIS — E1165 Type 2 diabetes mellitus with hyperglycemia: Secondary | ICD-10-CM | POA: Diagnosis not present

## 2018-07-14 LAB — HEMOGLOBIN A1C: Hgb A1c MFr Bld: 6.4 % (ref 4.6–6.5)

## 2018-07-14 LAB — LIPID PANEL
Cholesterol: 201 mg/dL — ABNORMAL HIGH (ref 0–200)
HDL: 35.6 mg/dL — ABNORMAL LOW (ref >39.00)
LDL Cholesterol: 132 mg/dL — ABNORMAL HIGH (ref 0–99)
NonHDL: 165.84
Total CHOL/HDL Ratio: 6
Triglycerides: 168 mg/dL — ABNORMAL HIGH (ref 0.0–149.0)
VLDL: 33.6 mg/dL (ref 0.0–40.0)

## 2018-07-14 LAB — COMPREHENSIVE METABOLIC PANEL
ALT: 13 U/L (ref 0–53)
AST: 15 U/L (ref 0–37)
Albumin: 4.1 g/dL (ref 3.5–5.2)
Alkaline Phosphatase: 62 U/L (ref 39–117)
BUN: 26 mg/dL — ABNORMAL HIGH (ref 6–23)
CO2: 30 mEq/L (ref 19–32)
Calcium: 9.3 mg/dL (ref 8.4–10.5)
Chloride: 105 mEq/L (ref 96–112)
Creatinine, Ser: 1.22 mg/dL (ref 0.40–1.50)
GFR: 58.3 mL/min — ABNORMAL LOW (ref >60.00)
Glucose, Bld: 116 mg/dL — ABNORMAL HIGH (ref 70–99)
Potassium: 4.7 mEq/L (ref 3.5–5.1)
Sodium: 141 mEq/L (ref 135–145)
Total Bilirubin: 0.4 mg/dL (ref 0.2–1.2)
Total Protein: 6.5 g/dL (ref 6.0–8.3)

## 2018-07-19 ENCOUNTER — Other Ambulatory Visit: Payer: Self-pay | Admitting: Physician Assistant

## 2018-07-19 DIAGNOSIS — E785 Hyperlipidemia, unspecified: Secondary | ICD-10-CM

## 2018-07-19 DIAGNOSIS — E1169 Type 2 diabetes mellitus with other specified complication: Secondary | ICD-10-CM

## 2018-07-19 MED ORDER — LIVALO 2 MG PO TABS: 1.0000 | ORAL_TABLET | Freq: Every evening | ORAL | 3 refills | Status: DC

## 2018-07-20 ENCOUNTER — Ambulatory Visit: Payer: Medicare HMO | Admitting: Neurology

## 2018-08-03 DIAGNOSIS — Z7984 Long term (current) use of oral hypoglycemic drugs: Secondary | ICD-10-CM | POA: Diagnosis not present

## 2018-08-03 DIAGNOSIS — E119 Type 2 diabetes mellitus without complications: Secondary | ICD-10-CM | POA: Diagnosis not present

## 2018-08-03 DIAGNOSIS — H2513 Age-related nuclear cataract, bilateral: Secondary | ICD-10-CM | POA: Diagnosis not present

## 2018-08-03 LAB — HM DIABETES EYE EXAM

## 2018-08-05 ENCOUNTER — Encounter: Payer: Self-pay | Admitting: Emergency Medicine

## 2018-08-19 ENCOUNTER — Ambulatory Visit (INDEPENDENT_AMBULATORY_CARE_PROVIDER_SITE_OTHER): Payer: Medicare HMO | Admitting: Neurology

## 2018-08-19 ENCOUNTER — Other Ambulatory Visit: Payer: Self-pay

## 2018-08-19 ENCOUNTER — Encounter: Payer: Self-pay | Admitting: Neurology

## 2018-08-19 VITALS — BP 204/68 | HR 66 | Temp 97.5°F | Ht 71.0 in | Wt 170.4 lb

## 2018-08-19 DIAGNOSIS — G3184 Mild cognitive impairment, so stated: Secondary | ICD-10-CM | POA: Diagnosis not present

## 2018-08-19 DIAGNOSIS — R410 Disorientation, unspecified: Secondary | ICD-10-CM | POA: Diagnosis not present

## 2018-08-19 NOTE — Patient Instructions (Signed)
1. Schedule MRI brain without contrast 2. Schedule EEG 3. Follow-up in 6 months, call for any changes   RECOMMENDATIONS FOR ALL PATIENTS WITH MEMORY PROBLEMS: 1. Continue to exercise (Recommend 30 minutes of walking everyday, or 3 hours every week) 2. Increase social interactions - continue going to [place] and enjoy social gatherings with friends and family 3. Eat healthy, avoid fried foods and eat more fruits and vegetables 4. Maintain adequate blood pressure, blood sugar, and blood cholesterol level. Reducing the risk of stroke and cardiovascular disease also helps promoting better memory. 5. Avoid stressful situations. Live a simple life and avoid aggravations. Organize your time and prepare for the next day in anticipation. 6. Sleep well, avoid any interruptions of sleep and avoid any distractions in the bedroom that may interfere with adequate sleep quality 7. Avoid sugar, avoid sweets as there is a strong link between excessive sugar intake, diabetes, and cognitive impairment The Mediterranean diet has been shown to help patients reduce the risk of progressive memory disorders and reduces cardiovascular risk. This includes eating fish, eat fruits and green leafy vegetables, nuts like almonds and hazelnuts, walnuts, and also use olive oil. Avoid fast foods and fried foods as much as possible. Avoid sweets and sugar as sugar use has been linked to worsening of memory function.  There is always a concern of gradual progression of memory problems. If this is the case, then we may need to adjust level of care according to patient needs. Support, both to the patient and caregiver, should then be put into place.

## 2018-08-19 NOTE — Progress Notes (Signed)
° °NEUROLOGY CONSULTATION NOTE ° °John N Battenfield Jr. °MRN: [id number] °DOB: 09/14/1945 ° °Referring provider: William Martin, PA-C °Primary care provider: William Martin, PA-C ° °Reason for consult:  Memory loss ° °Thank you for your kind referral of John N Delair Jr. for consultation of the above symptoms. Although his history is well known to you, please allow me to reiterate it for the purpose of our medical record. The patient was accompanied to the clinic by his wife who also provides collateral information. Records and images were personally reviewed where available. ° ° °HISTORY OF PRESENT ILLNESS: °This is a 73 year old right-handed man with a history of hypertension, hyperlipidemia, diabetes, presenting for memory loss. His wife is present to provide additional information. He feels his memory is better than when symptoms were more concerning last May. He recalls that things got absolutely horrible, he would go to the garage and forget what he was doing. He is getting better but still not where he used to be, just very forgetful. His wife reports quite sudden onset of cognitive changes 4 months ago which took her by surprise. He was wandering around and could not remember conversations, like he was lost. No staring/unresponsiveness. This lasted a week or so. They saw his PCP, MMSE was 26/30 on 06/15/2018. No infectious cause seen. His wife requested to stop statin. Things calmed down a month or so after. There are some days where he seems like he can't seem to get it together and would sit and watch TV all day, which is unlike him. He would always have a project, which is not very often now. His wife feels he is now 90% back to where he was prior to 4 months ago, his memory is not 100%, which is new. He does not seem lost anymore, but "can't get it all the way together." he denies getting lost driving. He denies missing medications. His wife has always managed finances. She states he has never been a big  talker, but now they have to really draw him out to get him into conversations. He has been more withdrawn. Sleep is good. No paranoia or hallucinations. 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 He thinks his mood is pretty good. He denies any headaches, dizziness, vision changes, neck pain, focal weakness, bowel/bladder dysfunction. He has back pain and occasional numbness in both feet. His mother had Alzheimer's disease. No history of significant head injuries. He does not drink alcohol.  ° °I personally reviewed head CT done 05/2018 which did not show any acute changes, there was moderate chronic microvascular disease.  ° °Laboratory Data: °Lab Results  °Component Value Date  ° TSH 3.32 06/15/2018  ° °Lab Results  °Component Value Date  ° VITAMINB12 676 06/15/2018  ° ° ° °PAST MEDICAL HISTORY: °Past Medical History:  °Diagnosis Date  °• Arthritis   °• Closed fracture dislocation of right elbow   ° 30 years ago -- fell out of theback of a truck  °• Diabetes mellitus without complication ([facility])   °• Hx of colonic polyps 02/23/2017  °• Hyperlipidemia   °• Hypertension   ° Diagnosed 10 years ago. No on medication. Denies history of CAD.   ° ° °PAST SURGICAL HISTORY: °Past Surgical History:  °Procedure Laterality Date  °• OLECRANON BURSECTOMY Right 08/18/2016  ° Procedure: OLECRANON BURSECTOMY;  Surgeon: Dean, Scott Gregory, MD;  Location: [facility] OR;  Service: Orthopedics;  Laterality: Right;  ° ° °  MEDICATIONS: Current Outpatient Medications on File Prior to Visit  Medication Sig Dispense Refill   acetaminophen (TYLENOL) 500 MG tablet Take 500-1,000 mg by mouth every 6 (six) hours as needed (for pain.).     aspirin 81 MG chewable tablet Chew 81 mg by mouth daily.     Blood Glucose Monitoring Suppl ([place]) w/Device KIT Use to test blood sugars one time daily. 1 kit 1   glucose blood (ONETOUCH VERIO) test strip Use as instructed to test blood  sugars one time daily. Dx Code E11.65 100 each 12   losartan (COZAAR) 100 MG tablet Take 1 tablet (100 mg total) by mouth daily. 30 tablet 3   metFORMIN (GLUCOPHAGE) 500 MG tablet TAKE 1 TABLET BY MOUTH IN THE MORNING AND 2 TABLETS IN THE EVENING 270 tablet 0   ONE TOUCH LANCETS MISC One Touch Delica 95M lancets Check blood sugars daily Dx:E11.65 100 each 3   Pitavastatin Calcium (LIVALO) 2 MG TABS Take 1 tablet (2 mg total) by mouth every evening. 30 tablet 3   No current facility-administered medications on file prior to visit.     ALLERGIES: Allergies  Allergen Reactions   Antibacterial Hand Soap [Triclosan]     Water blisters    FAMILY HISTORY: Family History  Problem Relation Age of Onset   Hypertension Mother    Dementia Mother        73s   Transient ischemic attack Mother        35s   Kidney disease Father    COPD Father    Hypertension Brother    Alcohol abuse Maternal Grandfather    Mental illness Paternal Grandmother    Hypertension Daughter    Healthy Son    Learning disabilities Son    Lung cancer Maternal Aunt    Colon cancer Neg Hx    Colon polyps Neg Hx    Esophageal cancer Neg Hx    Rectal cancer Neg Hx    Stomach cancer Neg Hx     SOCIAL HISTORY: Social History   Socioeconomic History   Marital status: Married    Spouse name: Not on file   Number of children: 3   Years of education: Not on file   Highest education level: 12th grade  Occupational History   Occupation: Retired    Comment: Electrical engineer strain: Not on file   Food insecurity    Worry: Not on file    Inability: Not on Lexicographer needs    Medical: Not on file    Non-medical: Not on file  Tobacco Use   Smoking status: Current Every Day Smoker    Packs/day: 1.00    Years: 50.00    Pack years: 50.00    Types: Cigarettes    Start date: 08/12/1966   Smokeless tobacco: Never Used  Substance and Sexual  Activity   Alcohol use: No   Drug use: No   Sexual activity: Yes  Lifestyle   Physical activity    Days per week: Not on file    Minutes per session: Not on file   Stress: Not on file  Relationships   Social connections    Talks on phone: Not on file    Gets together: Not on file    Attends religious service: Not on file    Active member of club or organization: Not on file    Attends meetings of clubs or organizations: Not on  file  °  Relationship status: Not on file  °• Intimate partner violence  °  Fear of current or ex partner: Not on file  °  Emotionally abused: Not on file  °  Physically abused: Not on file  °  Forced sexual activity: Not on file  °Other Topics Concern  °• Not on file  °Social History Narrative  ° Social Review  °   ° Patient lives at home with. Spouse John Woodward  ° Pt lives in a one or two story home? One story home  ° Does patient lives in a facility? If so where? Private home  ° What is patient highest level of education? 12 grade  ° Pt is RIGHT handed  ° ° °REVIEW OF SYSTEMS: °Constitutional: No fevers, chills, or sweats, no generalized fatigue, change in appetite °Eyes: No visual changes, double vision, eye pain °Ear, nose and throat: No hearing loss, ear pain, nasal congestion, sore throat °Cardiovascular: No chest pain, palpitations °Respiratory:  No shortness of breath at rest or with exertion, wheezes °GastrointestinaI: No nausea, vomiting, diarrhea, abdominal pain, fecal incontinence °Genitourinary:  No dysuria, urinary retention or frequency °Musculoskeletal:  No neck pain, +back pain °Integumentary: No rash, pruritus, skin lesions °Neurological: as above °Psychiatric: No depression, insomnia, anxiety °Endocrine: No palpitations, fatigue, diaphoresis, mood swings, change in appetite, change in weight, increased thirst °Hematologic/Lymphatic:  No anemia, purpura, petechiae. °Allergic/Immunologic: no itchy/runny eyes, nasal congestion, recent allergic  reactions, rashes ° °PHYSICAL EXAM: °Vitals:  ° 08/19/18 0903  °BP: (!) 204/68  °Pulse: 66  °Temp: (!) 97.5 °F (36.4 °C)  °SpO2: 97%  ° °General: No acute distress °Head:  Normocephalic/atraumatic °Skin/Extremities: No rash, no edema °Neurological Exam: °Mental status: alert and oriented to person, place, and time, no dysarthria or aphasia, Fund of knowledge is appropriate.  Remote memory intact.  Attention and concentration are reduced.   Able to name objects, difficulty with repetition. °Montreal Cognitive Assessment  08/19/2018  °Visuospatial/ Executive (0/5) 5  °Naming (0/3) 3  °Attention: Read list of digits (0/2) 0  °Attention: Read list of letters (0/1) 1  °Attention: Serial 7 subtraction starting at 100 (0/3) 1  °Language: Repeat phrase (0/2) 0  °Language : Fluency (0/1) 0  °Abstraction (0/2) 0  °Delayed Recall (0/5) 0  °Orientation (0/6) 5  °Total 15  °Adjusted Score (based on education) 16  ° °Cranial nerves: °CN I: not tested °CN II: pupils equal, round and reactive to light, visual fields intact °CN III, IV, VI:  full range of motion, no nystagmus, no ptosis °CN V: facial sensation intact °CN VII: upper and lower face symmetric °CN VIII: hearing intact to conversation °CN IX, X: gag intact, uvula midline °CN XI: sternocleidomastoid and trapezius muscles intact °CN XII: tongue midline °Bulk & Tone: normal, no fasciculations. °Motor: 5/5 throughout with no pronator drift. °Sensation: intact to light touch, cold. Romberg test negative °Deep Tendon Reflexes: +1 throughout, no ankle clonus °Plantar responses: downgoing bilaterally °Cerebellar: no incoordination on finger to nose testing °Gait: narrow-based and steady, able to tandem walk adequately. °Tremor: none ° °IMPRESSION: °This is a 73 year old right-handed man with a history of hypertension, hyperlipidemia, diabetes, presenting for transient confusion/memory loss that appeared quite suddenly 4 months ago. Wife reports he is now 90% back to normal.  Neurological exam non-focal, MOCA score today 16/30. Symptoms suggestive of Mild Cognitive Impairment. Etiology of transient increased confusion unclear, no infectious cause at that time. MRI brain without contrast will be ordered to assess   for underlying structural abnormality. With transient confusion, EEG will also be done. His wife reports symptoms suggestive of depression, mood can also cause sudden cognitive changes. At this time, he does not appear to have difficulties with complex tasks. No clear indication to start cholinesterase inhibitors currently. Continue to monitor symptoms. We discussed the importance of control of vascular risk factors, physical exercise, and brain stimulation exercises for brain health. Follow-up in 6 months, they know to call for any changes.   Thank you for allowing me to participate in the care of this patient. Please do not hesitate to call for any questions or concerns.   Ellouise Newer, M.D.  CC: Raiford Noble, PA-C

## 2018-09-07 ENCOUNTER — Other Ambulatory Visit: Payer: Medicare HMO

## 2018-09-08 ENCOUNTER — Telehealth: Payer: Self-pay | Admitting: Pulmonary Disease

## 2018-09-09 ENCOUNTER — Other Ambulatory Visit: Payer: Self-pay | Admitting: Physician Assistant

## 2018-09-09 NOTE — Telephone Encounter (Signed)
wiil f/u

## 2018-09-12 ENCOUNTER — Telehealth: Payer: Self-pay | Admitting: Physician Assistant

## 2018-09-12 MED ORDER — HYDROCHLOROTHIAZIDE 12.5 MG PO CAPS: 12.5000 mg | ORAL_CAPSULE | Freq: Every day | ORAL | 1 refills | Status: DC

## 2018-09-12 NOTE — Telephone Encounter (Signed)
Would like to add on HCTZ 12.5 mg once daily with his losartan for now.  Ok to send in Rx 30 with 1 refill. Will reassess BP at scheduled follow-up.

## 2018-09-12 NOTE — Telephone Encounter (Signed)
Please advise 

## 2018-09-12 NOTE — Telephone Encounter (Signed)
Pt's wife called in stating that pt's BP Friday was 158/82, she states that she was told to call back with BP readings.

## 2018-09-12 NOTE — Telephone Encounter (Signed)
Pt informed of PCP recommendations. HCTZ was filled to pharmacy. Pt was advised to increase water intake and to watch his salt intake as this medication is a diuretic and will make him use the restroom more frequently. Pt stated an understanding.

## 2018-09-14 ENCOUNTER — Ambulatory Visit
Admission: RE | Admit: 2018-09-14 | Discharge: 2018-09-14 | Disposition: A | Discharge status: Home / Self Care | Payer: Medicare HMO | Source: Ambulatory Visit | Attending: Neurology | Admitting: Neurology

## 2018-09-14 ENCOUNTER — Other Ambulatory Visit: Payer: Self-pay

## 2018-09-14 DIAGNOSIS — G3184 Mild cognitive impairment, so stated: Secondary | ICD-10-CM

## 2018-09-14 DIAGNOSIS — R410 Disorientation, unspecified: Secondary | ICD-10-CM | POA: Diagnosis not present

## 2018-09-15 ENCOUNTER — Ambulatory Visit (INDEPENDENT_AMBULATORY_CARE_PROVIDER_SITE_OTHER): Payer: Medicare HMO | Admitting: Physician Assistant

## 2018-09-15 ENCOUNTER — Encounter: Payer: Self-pay | Admitting: Physician Assistant

## 2018-09-15 VITALS — BP 138/88 | HR 68 | Temp 98.8°F | Resp 16 | Ht 71.0 in | Wt 166.0 lb

## 2018-09-15 DIAGNOSIS — I10 Essential (primary) hypertension: Secondary | ICD-10-CM | POA: Diagnosis not present

## 2018-09-15 LAB — BASIC METABOLIC PANEL
BUN: 18 mg/dL (ref 6–23)
CO2: 25 mEq/L (ref 19–32)
Calcium: 9.5 mg/dL (ref 8.4–10.5)
Chloride: 100 mEq/L (ref 96–112)
Creatinine, Ser: 1.1 mg/dL (ref 0.40–1.50)
GFR: 65.67 mL/min (ref >60.00)
Glucose, Bld: 107 mg/dL — ABNORMAL HIGH (ref 70–99)
Potassium: 4.4 mEq/L (ref 3.5–5.1)
Sodium: 137 mEq/L (ref 135–145)

## 2018-09-15 NOTE — Progress Notes (Signed)
Patient presents to clinic today for follow-up of hypertension. Patient is currently on a regimen of losartan 100 mg daily. HCTZ 12.5 mg QD was added a few days ago as home BP endorses were still in the 177 range systolic. Patient endorses taking medications as directed. Tolerating well. Patient denies chest pain, palpitations, lightheadedness, dizziness, vision changes or frequent headaches.  BP Readings from Last 3 Encounters:  09/15/18 138/88  08/19/18 (!) 204/68  06/30/18 (!) 160/86   Past Medical History:  Diagnosis Date  . Arthritis   . Closed fracture dislocation of right elbow    30 years ago -- fell out of theback of a truck  . Diabetes mellitus without complication ([place])   . Hx of colonic polyps 02/23/2017  . Hyperlipidemia   . Hypertension    Diagnosed 10 years ago. No on medication. Denies history of CAD.     Current Outpatient Medications on File Prior to Visit  Medication Sig Dispense Refill  . acetaminophen (TYLENOL) 500 MG tablet Take 500-1,000 mg by mouth every 6 (six) hours as needed (for pain.).    Marland Kitchen aspirin 81 MG chewable tablet Chew 81 mg by mouth daily.    . Blood Glucose Monitoring Suppl ([place]) w/Device KIT Use to test blood sugars one time daily. 1 kit 1  . glucose blood (ONETOUCH VERIO) test strip Use as instructed to test blood sugars one time daily. Dx Code E11.65 100 each 12  . hydrochlorothiazide (MICROZIDE) 12.5 MG capsule Take 1 capsule (12.5 mg total) by mouth daily. 30 capsule 1  . losartan (COZAAR) 100 MG tablet Take 1 tablet (100 mg total) by mouth daily. 30 tablet 3  . metFORMIN (GLUCOPHAGE) 500 MG tablet TAKE 1 TABLET BY MOUTH ONCE DAILY IN THE MORNING AND  2  TABLETS  IN  THE  EVENING 270 tablet 0  . ONE TOUCH LANCETS MISC One Touch Delica 93J lancets Check blood sugars daily Dx:E11.65 100 each 3  . Pitavastatin Calcium (LIVALO) 2 MG TABS Take 1 tablet (2 mg total) by mouth every evening. 30 tablet 3   No current  facility-administered medications on file prior to visit.     Allergies  Allergen Reactions  . Antibacterial Hand Soap [Triclosan]     Water blisters    Family History  Problem Relation Age of Onset  . Hypertension Mother   . Dementia Mother        67s  . Transient ischemic attack Mother        66s  . Kidney disease Father   . COPD Father   . Hypertension Brother   . Alcohol abuse Maternal Grandfather   . Mental illness Paternal Grandmother   . Hypertension Daughter   . Healthy Son   . Learning disabilities Son   . Lung cancer Maternal Aunt   . Colon cancer Neg Hx   . Colon polyps Neg Hx   . Esophageal cancer Neg Hx   . Rectal cancer Neg Hx   . Stomach cancer Neg Hx     Social History   Socioeconomic History  . Marital status: Married    Spouse name: Not on file  . Number of children: 3  . Years of education: Not on file  . Highest education level: 12th grade  Occupational History  . Occupation: Retired    Comment: Manufacturing systems engineer  . Financial resource strain: Not on file  . Food insecurity    Worry: Not on file  Inability: Not on file  . Transportation needs    Medical: Not on file    Non-medical: Not on file  Tobacco Use  . Smoking status: Current Every Day Smoker    Packs/day: 1.00    Years: 50.00    Pack years: 50.00    Types: Cigarettes    Start date: 08/12/1966  . Smokeless tobacco: Never Used  Substance and Sexual Activity  . Alcohol use: No  . Drug use: No  . Sexual activity: Yes  Lifestyle  . Physical activity    Days per week: Not on file    Minutes per session: Not on file  . Stress: Not on file  Relationships  . Social Herbalist on phone: Not on file    Gets together: Not on file    Attends religious service: Not on file    Active member of club or organization: Not on file    Attends meetings of clubs or organizations: Not on file    Relationship status: Not on file  Other Topics Concern  . Not on file   Social History Narrative   Social Review      Patient lives at home with. Spouse Deray Dawes   Pt lives in a one or two story home? One story home   Does patient lives in a facility? If so where? Private home   What is patient highest level of education? 12 grade   Pt is RIGHT handed   Review of Systems - See HPI.  All other ROS are negative.  Resp 16   Ht '5\' 11"'$  (1.803 m)   Wt 166 lb (75.3 kg)   BMI 23.15 kg/m   Physical Exam Vitals signs reviewed.  Constitutional:      Appearance: Normal appearance.  HENT:     Head: Normocephalic and atraumatic.     Right Ear: Tympanic membrane normal.     Left Ear: Tympanic membrane normal.     Nose: Nose normal.     Mouth/Throat:     Mouth: Mucous membranes are moist.  Eyes:     Conjunctiva/sclera: Conjunctivae normal.     Pupils: Pupils are equal, round, and reactive to light.  Neck:     Musculoskeletal: Neck supple.  Cardiovascular:     Rate and Rhythm: Normal rate and regular rhythm.     Pulses: Normal pulses.  Neurological:     General: No focal deficit present.     Mental Status: He is alert and oriented to person, place, and time.  Psychiatric:        Mood and Affect: Mood normal.    Recent Results (from the past 2160 hour(s))  POCT Urinalysis Dipstick     Status: Abnormal   Collection Time: 06/21/18  2:32 PM  Result Value Ref Range   Color, UA yellow    Clarity, UA clear    Glucose, UA Negative Negative   Bilirubin, UA negative    Ketones, UA negative    Spec Grav, UA 1.015 1.010 - 1.025   Blood, UA negative    pH, UA 6.5 5.0 - 8.0   Protein, UA Positive (A) Negative   Urobilinogen, UA 0.2 0.2 or 1.0 E.U./dL   Nitrite, UA negative    Leukocytes, UA Negative Negative   Appearance     Odor    Urine Culture     Status: None   Collection Time: 06/21/18  2:36 PM   Specimen: Blood  Result Value Ref Range  MICRO NUMBER: [id number]    SPECIMEN QUALITY: Adequate    Sample Source NOT GIVEN    STATUS: FINAL     Result: No Growth   RPR     Status: None   Collection Time: 06/21/18  2:36 PM  Result Value Ref Range   RPR Ser Ql NON-REACTIVE NON-REACTI  Comp Met (CMET)     Status: Abnormal   Collection Time: 06/21/18  2:38 PM  Result Value Ref Range   Glucose, Bld 56 (L) 65 - 99 mg/dL    Comment: .            Fasting reference interval .    BUN 21 7 - 25 mg/dL   Creat 1.19 (H) 0.70 - 1.18 mg/dL    Comment: For patients >7 years of age, the reference limit for Creatinine is approximately 13% higher for people identified as African-American. .    BUN/Creatinine Ratio 18 6 - 22 (calc)   Sodium 140 135 - 146 mmol/L   Potassium 3.8 3.5 - 5.3 mmol/L   Chloride 99 98 - 110 mmol/L   CO2 30 20 - 32 mmol/L   Calcium 9.2 8.6 - 10.3 mg/dL   Total Protein 7.3 6.1 - 8.1 g/dL   Albumin 4.3 3.6 - 5.1 g/dL   Globulin 3.0 1.9 - 3.7 g/dL (calc)   AG Ratio 1.4 1.0 - 2.5 (calc)   Total Bilirubin 0.3 0.2 - 1.2 mg/dL   Alkaline phosphatase (APISO) 64 35 - 144 U/L   AST 16 10 - 35 U/L   ALT 11 9 - 46 U/L  Hemoglobin A1c     Status: None   Collection Time: 07/14/18  8:56 AM  Result Value Ref Range   Hgb A1c MFr Bld 6.4 4.6 - 6.5 %    Comment: Glycemic Control Guidelines for People with Diabetes:Non Diabetic:  <6%Goal of Therapy: <7%Additional Action Suggested:  >8%   Lipid Profile     Status: Abnormal   Collection Time: 07/14/18  8:56 AM  Result Value Ref Range   Cholesterol 201 (H) 0 - 200 mg/dL    Comment: ATP III Classification       Desirable:  < 200 mg/dL               Borderline High:  200 - 239 mg/dL          High:  > = 240 mg/dL   Triglycerides 168.0 (H) 0.0 - 149.0 mg/dL    Comment: Normal:  <150 mg/dLBorderline High:  150 - 199 mg/dL   HDL 35.60 (L) >39.00 mg/dL   VLDL 33.6 0.0 - 40.0 mg/dL   LDL Cholesterol 132 (H) 0 - 99 mg/dL   Total CHOL/HDL Ratio 6     Comment:                Men          Women1/2 Average Risk     3.4          3.3Average Risk          5.0          4.42X Average Risk           9.6          7.13X Average Risk          15.0          11.0  NonHDL 165.84     Comment: NOTE:  Non-HDL goal should be 30 mg/dL higher than patient's LDL goal (i.e. LDL goal of < 70 mg/dL, would have non-HDL goal of < 100 mg/dL)  Comp Met (CMET)     Status: Abnormal   Collection Time: 07/14/18  8:56 AM  Result Value Ref Range   Sodium 141 135 - 145 mEq/L   Potassium 4.7 3.5 - 5.1 mEq/L   Chloride 105 96 - 112 mEq/L   CO2 30 19 - 32 mEq/L   Glucose, Bld 116 (H) 70 - 99 mg/dL   BUN 26 (H) 6 - 23 mg/dL   Creatinine, Ser 1.22 0.40 - 1.50 mg/dL   Total Bilirubin 0.4 0.2 - 1.2 mg/dL   Alkaline Phosphatase 62 39 - 117 U/L   AST 15 0 - 37 U/L   ALT 13 0 - 53 U/L   Total Protein 6.5 6.0 - 8.3 g/dL   Albumin 4.1 3.5 - 5.2 g/dL   Calcium 9.3 8.4 - 10.5 mg/dL   GFR 58.30 (L) >60.00 mL/min  HM DIABETES EYE EXAM     Status: None   Collection Time: 08/03/18 12:00 AM  Result Value Ref Range   HM Diabetic Eye Exam No Retinopathy No Retinopathy   Assessment/Plan: 1. Essential hypertension BP much improved today. Continue current regimen. Continue DASH diet and home BP readings. Repeat BMP today. Follow-up 1 month.  - Basic metabolic panel   Leeanne Rio, PA-C

## 2018-09-15 NOTE — Patient Instructions (Signed)
Please go to the lab today for blood work.  I will call you with your results. We will alter treatment regimen(s) if indicated by your results.   BP seems much improved today.  Please keep up with current medication regimen. Check BP every couple of days (making sure you have been relaxing for 5 minutes or so first) and record. Follow-up with me in 1 month for reassessment of BP.    DASH Eating Plan DASH stands for "Dietary Approaches to Stop Hypertension." The DASH eating plan is a healthy eating plan that has been shown to reduce high blood pressure (hypertension). It may also reduce your risk for type 2 diabetes, heart disease, and stroke. The DASH eating plan may also help with weight loss. What are tips for following this plan?  General guidelines  Avoid eating more than 2,300 mg (milligrams) of salt (sodium) a day. If you have hypertension, you may need to reduce your sodium intake to 1,500 mg a day.  Limit alcohol intake to no more than 1 drink a day for nonpregnant women and 2 drinks a day for men. One drink equals 12 oz of beer, 5 oz of wine, or 1 oz of hard liquor.  Work with your health care provider to maintain a healthy body weight or to lose weight. Ask what an ideal weight is for you.  Get at least 30 minutes of exercise that causes your heart to beat faster (aerobic exercise) most days of the week. Activities may include walking, swimming, or biking.  Work with your health care provider or diet and nutrition specialist (dietitian) to adjust your eating plan to your individual calorie needs. Reading food labels   Check food labels for the amount of sodium per serving. Choose foods with less than 5 percent of the Daily Value of sodium. Generally, foods with less than 300 mg of sodium per serving fit into this eating plan.  To find whole grains, look for the word "whole" as the first word in the ingredient list. Shopping  Buy products labeled as "low-sodium" or "no salt  added."  Buy fresh foods. Avoid canned foods and premade or frozen meals. Cooking  Avoid adding salt when cooking. Use salt-free seasonings or herbs instead of table salt or sea salt. Check with your health care provider or pharmacist before using salt substitutes.  Do not fry foods. Cook foods using healthy methods such as baking, boiling, grilling, and broiling instead.  Cook with heart-healthy oils, such as olive, canola, soybean, or sunflower oil. Meal planning  Eat a balanced diet that includes: ? 5 or more servings of fruits and vegetables each day. At each meal, try to fill half of your plate with fruits and vegetables. ? Up to 6-8 servings of whole grains each day. ? Less than 6 oz of lean meat, poultry, or fish each day. A 3-oz serving of meat is about the same size as a deck of cards. One egg equals 1 oz. ? 2 servings of low-fat dairy each day. ? A serving of nuts, seeds, or beans 5 times each week. ? Heart-healthy fats. Healthy fats called Omega-3 fatty acids are found in foods such as flaxseeds and coldwater fish, like sardines, salmon, and mackerel.  Limit how much you eat of the following: ? Canned or prepackaged foods. ? Food that is high in trans fat, such as fried foods. ? Food that is high in saturated fat, such as fatty meat. ? Sweets, desserts, sugary drinks, and other foods  with added sugar. ? Full-fat dairy products.  Do not salt foods before eating.  Try to eat at least 2 vegetarian meals each week.  Eat more home-cooked food and less restaurant, buffet, and fast food.  When eating at a restaurant, ask that your food be prepared with less salt or no salt, if possible. What foods are recommended? The items listed may not be a complete list. Talk with your dietitian about what dietary choices are best for you. Grains Whole-grain or whole-wheat bread. Whole-grain or whole-wheat pasta. Brown rice. [place]. Bulgur. Whole-grain and low-sodium cereals.  Pita bread. Low-fat, low-sodium crackers. Whole-wheat flour tortillas. Vegetables Fresh or frozen vegetables (raw, steamed, roasted, or grilled). Low-sodium or reduced-sodium tomato and vegetable juice. Low-sodium or reduced-sodium tomato sauce and tomato paste. Low-sodium or reduced-sodium canned vegetables. Fruits All fresh, dried, or frozen fruit. Canned fruit in natural juice (without added sugar). Meat and other protein foods Skinless chicken or [place]. Ground chicken or [place]. Pork with fat trimmed off. Fish and seafood. Egg whites. Dried beans, peas, or lentils. Unsalted nuts, nut butters, and seeds. Unsalted canned beans. Lean cuts of beef with fat trimmed off. Low-sodium, lean deli meat. Dairy Low-fat (1%) or fat-free (skim) milk. Fat-free, low-fat, or reduced-fat cheeses. Nonfat, low-sodium ricotta or cottage cheese. Low-fat or nonfat yogurt. Low-fat, low-sodium cheese. Fats and oils Soft margarine without trans fats. Vegetable oil. Low-fat, reduced-fat, or light mayonnaise and salad dressings (reduced-sodium). Canola, safflower, olive, soybean, and sunflower oils. Avocado. Seasoning and other foods Herbs. Spices. Seasoning mixes without salt. Unsalted popcorn and pretzels. Fat-free sweets. What foods are not recommended? The items listed may not be a complete list. Talk with your dietitian about what dietary choices are best for you. Grains Baked goods made with fat, such as croissants, muffins, or some breads. Dry pasta or rice meal packs. Vegetables Creamed or fried vegetables. Vegetables in a cheese sauce. Regular canned vegetables (not low-sodium or reduced-sodium). Regular canned tomato sauce and paste (not low-sodium or reduced-sodium). Regular tomato and vegetable juice (not low-sodium or reduced-sodium). Angie Fava. Olives. Fruits Canned fruit in a light or heavy syrup. Fried fruit. Fruit in cream or butter sauce. Meat and other protein foods Fatty cuts of meat. Ribs. Fried  meat. Berniece Salines. Sausage. Bologna and other processed lunch meats. Salami. Fatback. Hotdogs. Bratwurst. Salted nuts and seeds. Canned beans with added salt. Canned or smoked fish. Whole eggs or egg yolks. Chicken or [place] with skin. Dairy Whole or 2% milk, cream, and half-and-half. Whole or full-fat cream cheese. Whole-fat or sweetened yogurt. Full-fat cheese. Nondairy creamers. Whipped toppings. Processed cheese and cheese spreads. Fats and oils Butter. Stick margarine. Lard. Shortening. Ghee. Bacon fat. Tropical oils, such as coconut, palm kernel, or palm oil. Seasoning and other foods Salted popcorn and pretzels. Onion salt, garlic salt, seasoned salt, table salt, and sea salt. Worcestershire sauce. Tartar sauce. Barbecue sauce. Teriyaki sauce. Soy sauce, including reduced-sodium. Steak sauce. Canned and packaged gravies. Fish sauce. Oyster sauce. Cocktail sauce. Horseradish that you find on the shelf. Ketchup. Mustard. Meat flavorings and tenderizers. Bouillon cubes. Hot sauce and Tabasco sauce. Premade or packaged marinades. Premade or packaged taco seasonings. Relishes. Regular salad dressings. Where to find more information:  National Heart, Lung, and [place]: https://wilson-eaton.com/  American Heart Association: www.heart.org Summary  The DASH eating plan is a healthy eating plan that has been shown to reduce high blood pressure (hypertension). It may also reduce your risk for type 2 diabetes, heart disease, and stroke.  With the  DASH eating plan, you should limit salt (sodium) intake to 2,300 mg a day. If you have hypertension, you may need to reduce your sodium intake to 1,500 mg a day.  When on the DASH eating plan, aim to eat more fresh fruits and vegetables, whole grains, lean proteins, low-fat dairy, and heart-healthy fats.  Work with your health care provider or diet and nutrition specialist (dietitian) to adjust your eating plan to your individual calorie needs. This information is  not intended to replace advice given to you by your health care provider. Make sure you discuss any questions you have with your health care provider. Document Released: 01/01/2011 Document Revised: 12/25/2016 Document Reviewed: 01/06/2016 Elsevier Patient Education  2020 [employer].

## 2018-09-21 ENCOUNTER — Telehealth: Payer: Self-pay | Admitting: Neurology

## 2018-09-21 ENCOUNTER — Telehealth: Payer: Self-pay

## 2018-09-21 ENCOUNTER — Ambulatory Visit (INDEPENDENT_AMBULATORY_CARE_PROVIDER_SITE_OTHER): Payer: Medicare HMO | Admitting: Neurology

## 2018-09-21 ENCOUNTER — Other Ambulatory Visit: Payer: Self-pay

## 2018-09-21 DIAGNOSIS — G3184 Mild cognitive impairment, so stated: Secondary | ICD-10-CM

## 2018-09-21 DIAGNOSIS — R410 Disorientation, unspecified: Secondary | ICD-10-CM

## 2018-09-21 NOTE — Telephone Encounter (Signed)
Spoke with patient's wife, stated that his daughter had not been to the house this week and she was the one checking BP readings.   09/17/18 - 144/82  PCP is aware of reading. Advised that patient continue meds as directed, watch salt intake, and routinely monitor BP.

## 2018-09-21 NOTE — Telephone Encounter (Signed)
Wife was wanting recent MRI results. Thanks!

## 2018-09-22 ENCOUNTER — Encounter: Payer: Self-pay | Admitting: Pulmonary Disease

## 2018-09-22 ENCOUNTER — Telehealth: Payer: Self-pay

## 2018-09-22 ENCOUNTER — Ambulatory Visit: Payer: Medicare HMO | Admitting: Pulmonary Disease

## 2018-09-22 VITALS — BP 146/88 | HR 68 | Temp 97.7°F | Ht 71.0 in | Wt 166.4 lb

## 2018-09-22 DIAGNOSIS — J439 Emphysema, unspecified: Secondary | ICD-10-CM

## 2018-09-22 DIAGNOSIS — F17210 Nicotine dependence, cigarettes, uncomplicated: Secondary | ICD-10-CM

## 2018-09-22 DIAGNOSIS — R69 Illness, unspecified: Secondary | ICD-10-CM | POA: Diagnosis not present

## 2018-09-22 IMAGING — MR MR ELBOW*R* WO/W CM
5 of 11 series · 19 of 40 positions shown · IV contrast (multihance)
Comparison: None.

CLINICAL DATA: Right elbow swelling.

EXAM:
MRI OF THE RIGHT ELBOW WITHOUT AND WITH CONTRAST
TECHNIQUE: Multiplanar, multisequence MR imaging of the elbow was performed
before and after the administration of intravenous contrast.
CONTRAST:  18mL MULTIHANCE GADOBENATE DIMEGLUMINE 529 MG/ML IV SOLN

[Series 4: T2 fat-sat · oblique · 3.0mm · 0.29mm/px · 2 of 34 slices shown (1 of 3)]
[im 1/34]
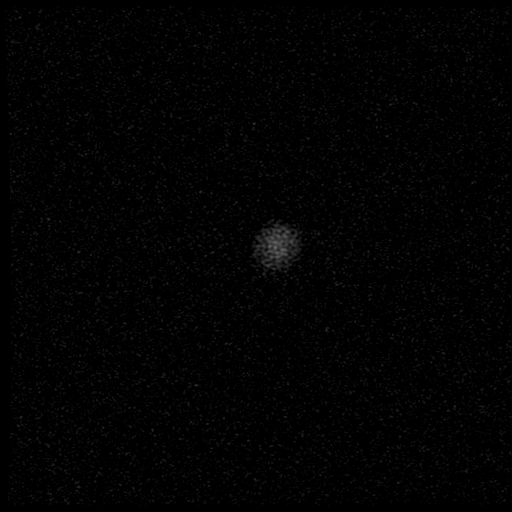
[im 34/34]
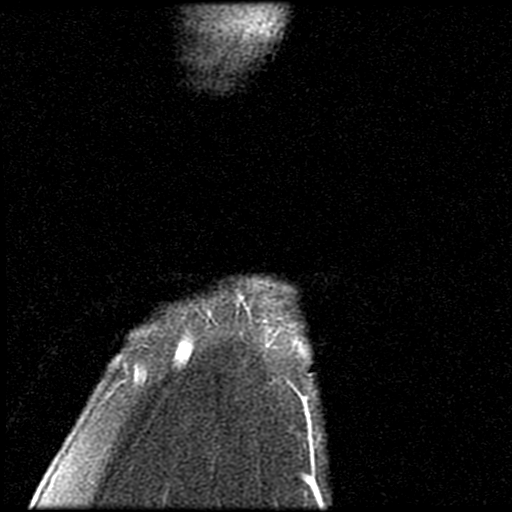

[Series 5: T1 · axial · 3.0mm · 0.31mm/px · z∈[-22,+148]mm · 4 of 45 slices shown]
[im 1/45]
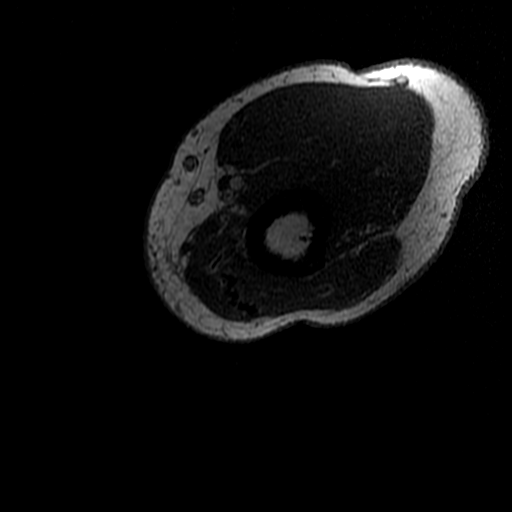
[im 15/45]
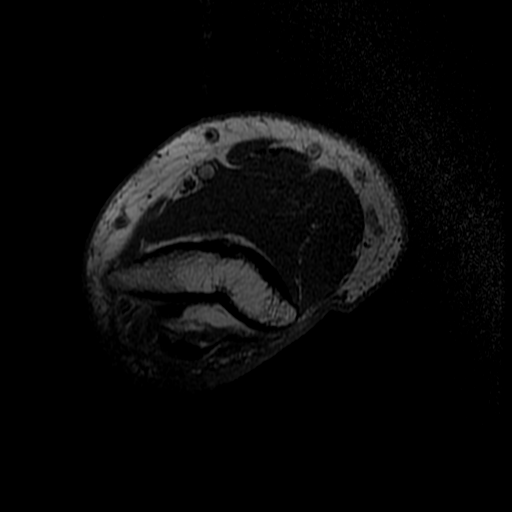
[im 30/45]
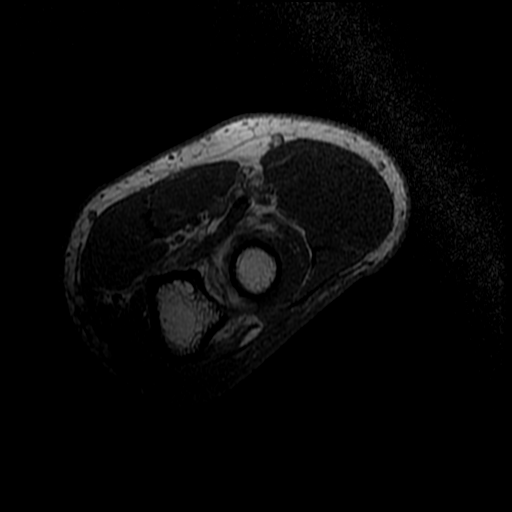
[im 45/45]
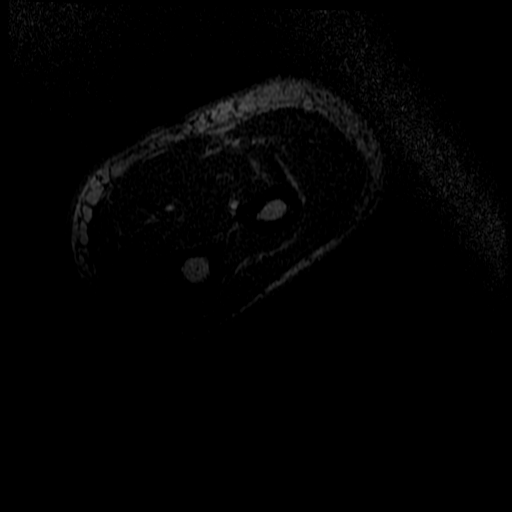

[Series 6: T1 fat-sat · axial · 3.0mm · 0.31mm/px · z∈[-22,+148]mm · 5 of 45 slices shown]
[im 1/45]
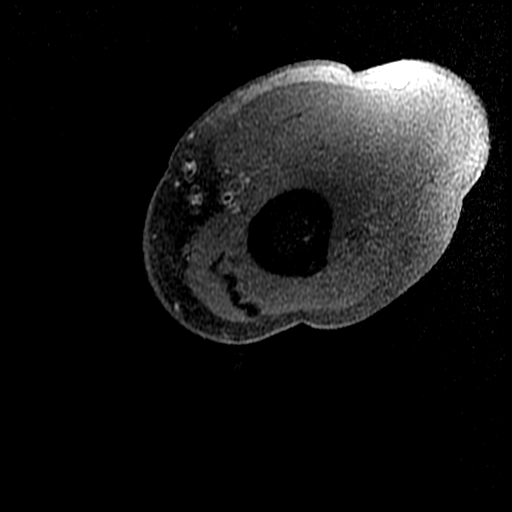
[im 12/45]
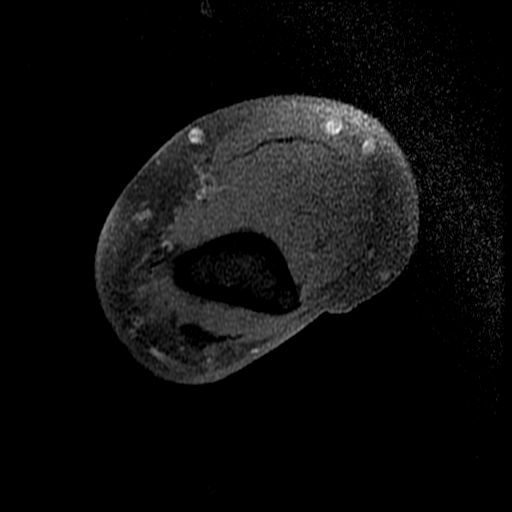
[im 23/45]
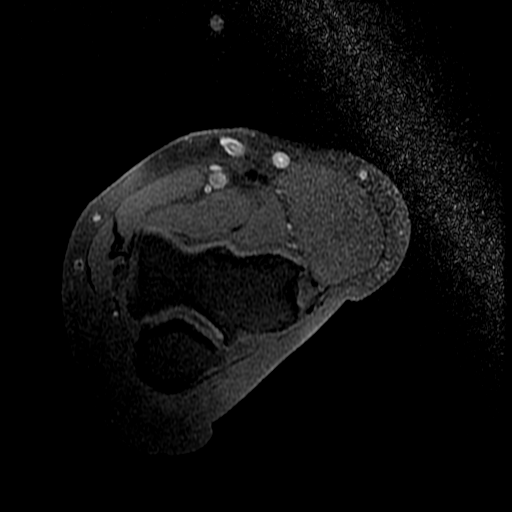
[im 34/45]
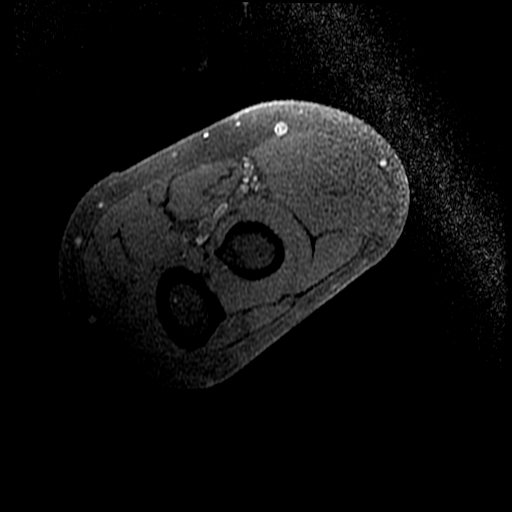
[im 45/45]
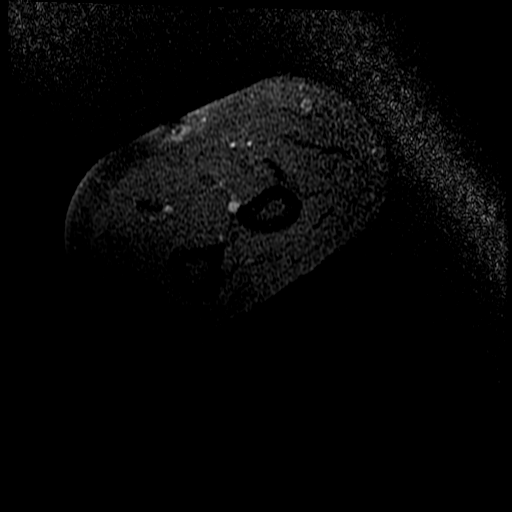

[Series 8: T2 fat-sat · axial · 3.0mm · 0.31mm/px · z∈[-22,+148]mm · 5 of 45 slices shown (2 of 3)]
[im 1/45]
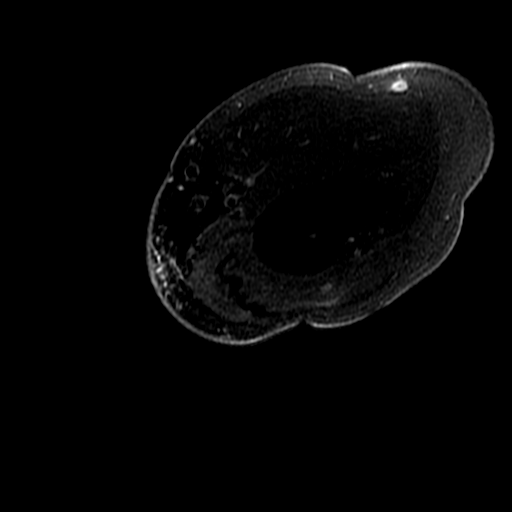
[im 12/45]
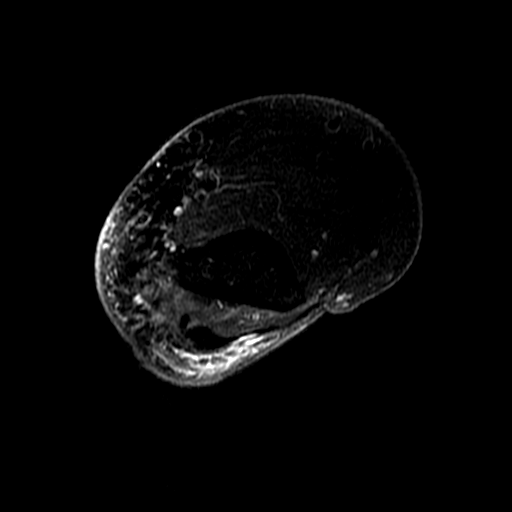
[im 23/45]
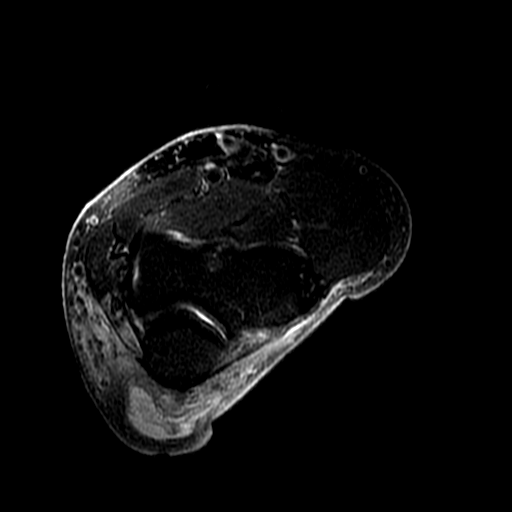
[im 34/45]
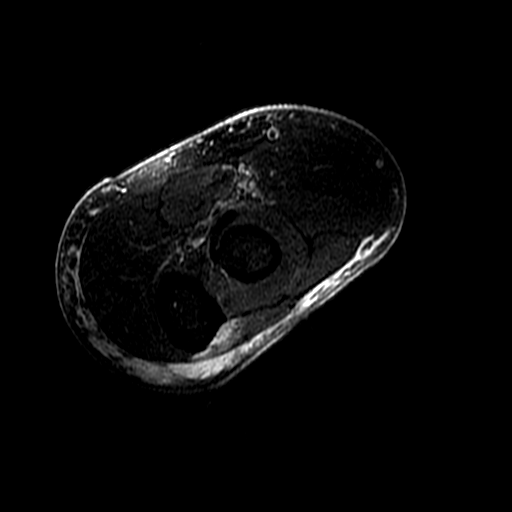
[im 45/45]
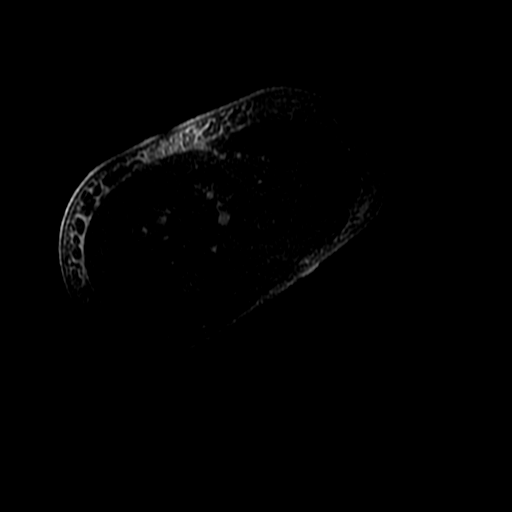

[Series 11: T2 fat-sat · coronal · 3.0mm · 0.31mm/px · 3 of 25 slices shown (3 of 3)]
[im 1/25]
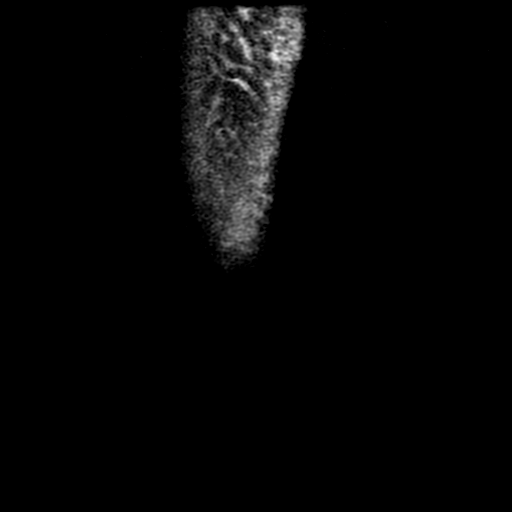
[im 13/25]
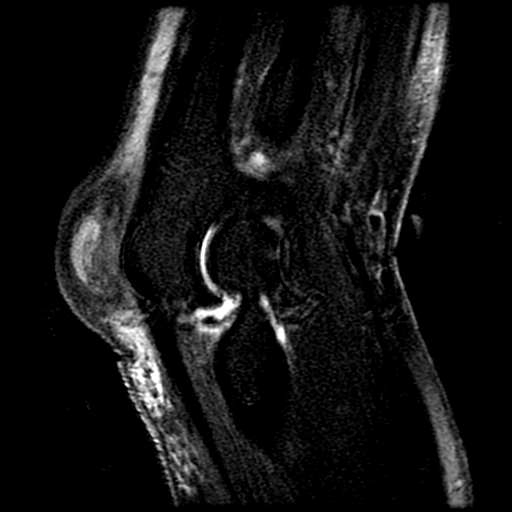
[im 25/25]
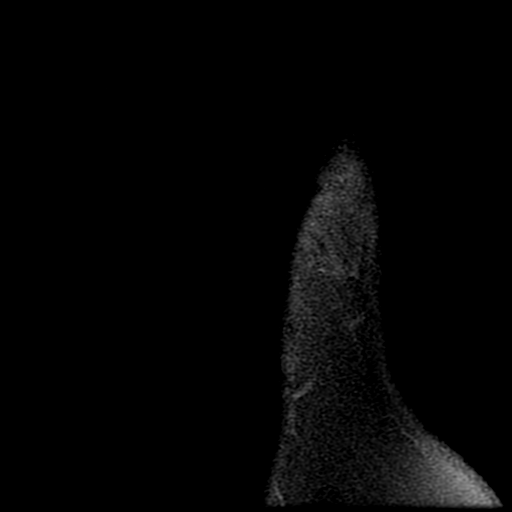

[19 of 40 positions shown; findings below may reference images not displayed]

FINDINGS: TENDONS

Common forearm flexor origin: Intact.

Common forearm extensor origin: Mild tendinosis of the common
extensor tendon origin.

Biceps: Intact.

Triceps: Intact.

LIGAMENTS

Medial stabilizers: Grossly intact.

Lateral stabilizers:  Grossly intact.

Cartilage: High-grade partial-thickness cartilage loss of the
radiocapitellar joint. Partial-thickness cartilage loss of the
ulnohumeral joint.

Joint: Small joint effusion with mild synovitis.

Cubital tunnel: Normal.

Bones: No marrow signal abnormality.  No fracture or dislocation.

Soft tissue: 1.2 x 2.3 x 2.4 cm heterogeneous fluid collection with
peripheral enhancement overlying the olecranon process. Severe
surrounding soft tissue edema and enhancement. No other fluid
collection.
IMPRESSION: 1. Severe soft tissue edema and enhancement overlying the olecranon
process concerning for severe cellulitis with a 1.2 x 2.3 x 2.4 cm
complex fluid collection within the area of soft tissue abnormality
concerning for infected olecranon bursitis or abscess.
Alternatively, acute gouty bursitis can also have this appearance,
but infection should be excluded.
2. Osteoarthritis of the right elbow as described above.

## 2018-09-22 MED ORDER — NICOTINE 14 MG/24HR TD PT24: 14.0000 mg | MEDICATED_PATCH | Freq: Every day | TRANSDERMAL | 1 refills | Status: DC

## 2018-09-22 MED ORDER — VARENICLINE TARTRATE 0.5 MG X 11 & 1 MG X 42 PO MISC: ORAL | 1 refills | Status: DC

## 2018-09-22 NOTE — Telephone Encounter (Signed)
Will you let me know what to tell the pt please. I don't mind calling.

## 2018-09-22 NOTE — Patient Instructions (Signed)
I have reviewed the CT scan which shows signs of emphysema.  This is an indication of damaged anterior lungs from smoking As you do not have any symptoms we will not start you on an inhaler  You really need to work on smoking cessation We will prescribe Chantix and nicotine patches to help with this Follow-up in 3 months

## 2018-09-22 NOTE — Telephone Encounter (Signed)
Sent you Result note for him earlier :) thanks

## 2018-09-22 NOTE — Telephone Encounter (Signed)
-----   Message from Cameron Sprang, MD sent at 09/22/2018  9:04 AM EDT ----- Pls let wife know the brain wave test was normal. The MRI brain did not show any evidence of tumor, stroke, or bleed. It showed age-related changes and hardening of the small blood vessels in the brain that are seen in patients with blood pressure, diabetes, and cholesterol issues, very important to continue with control these conditions. Thanks

## 2018-09-22 NOTE — Procedures (Signed)
ELECTROENCEPHALOGRAM REPORT  Date of Study: 09/21/2018  Patient's Name: John Woodward. MRN: [id number] Date of Birth: 11-01-1945  Referring Provider: Dr. Ellouise Newer  Clinical History: This is a 73 year old man with transient confusion/memory loss that occurred quite acutely.   Medications: TYLENOL 500 MG tablet aspirin 81 MG chewable tablet COZAAR 100 MG tablet GLUCOPHAGE 500 MG tablet LIVALO 2 MG TABS   Technical Summary: A multichannel digital EEG recording measured by the international 10-20 system with electrodes applied with paste and impedances below 5000 ohms performed in our laboratory with EKG monitoring in an awake and asleep patient.  Hyperventilation was not performed. Photic stimulation was performed.  The digital EEG was referentially recorded, reformatted, and digitally filtered in a variety of bipolar and referential montages for optimal display.    Description: The patient is awake and asleep during the recording.  During maximal wakefulness, there is a symmetric, medium voltage 9.5 Hz posterior dominant rhythm that attenuates with eye opening.  The record is symmetric.  During drowsiness and sleep, there is an increase in theta slowing of the background with occasional vertex waves seen.  Photic stimulation did not elicit any abnormalities.  There were no epileptiform discharges or electrographic seizures seen.    EKG lead was unremarkable.  Impression: This awake and asleep EEG is normal.    Clinical Correlation: A normal EEG does not exclude a clinical diagnosis of epilepsy.  If further clinical questions remain, prolonged EEG may be helpful.  Clinical correlation is advised.   Ellouise Newer, M.D.

## 2018-09-22 NOTE — Telephone Encounter (Signed)
Wife and pt informed of results.

## 2018-09-22 NOTE — Progress Notes (Signed)
John Woodward    [id number]    01/23/1946  Primary Care Physician:Martin, Luanna Cole, PA-C  Referring Physician: Brunetta Jeans, PA-C [address] [place],  [place] [address]  Chief complaint: Consult for emphysema  HPI: 73 year old smoker with history of diabetes, hypertension, hyperlipidemia.  Recently had a screening CT of the chest which showed emphysema and has been sent here for further evaluation States that he is doing well with regard to breathing.  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  Currently is working part-time as an Radiation protection practitioner Exposures: No known exposures.  No mold, hot tub, Jacuzzi Smoking history: 50-pack-year smoker.  Continues to smoke half pack per day Travel history: No significant travel history Relevant family history: No significant family history of lung disease  Outpatient Encounter Medications as of 09/22/2018  Medication Sig  . acetaminophen (TYLENOL) 500 MG tablet Take 500-1,000 mg by mouth every 6 (six) hours as needed (for pain.).  Marland Kitchen aspirin 81 MG chewable tablet Chew 81 mg by mouth daily.  . Blood Glucose Monitoring Suppl ([place]) w/Device KIT Use to test blood sugars one time daily.  Marland Kitchen glucose blood (ONETOUCH VERIO) test strip Use as instructed to test blood sugars one time daily. Dx Code E11.65  . hydrochlorothiazide (MICROZIDE) 12.5 MG capsule Take 1 capsule (12.5 mg total) by mouth daily.  Marland Kitchen losartan (COZAAR) 100 MG tablet Take 1 tablet (100 mg total) by mouth daily.  . metFORMIN (GLUCOPHAGE) 500 MG tablet TAKE 1 TABLET BY MOUTH ONCE DAILY IN THE MORNING AND  2  TABLETS  IN  THE  EVENING  . ONE TOUCH LANCETS MISC One Touch Delica 03U lancets Check blood sugars daily Dx:E11.65  . [DISCONTINUED] Pitavastatin Calcium (LIVALO) 2 MG TABS Take 1 tablet (2 mg total) by  mouth every evening.   No facility-administered encounter medications on file as of 09/22/2018.     Allergies as of 09/22/2018 - Review Complete 09/22/2018  Allergen Reaction Noted  . Antibacterial hand soap [triclosan]  08/18/2016    Past Medical History:  Diagnosis Date  . Arthritis   . Closed fracture dislocation of right elbow    30 years ago -- fell out of theback of a truck  . Diabetes mellitus without complication ([facility])   . Hx of colonic polyps 02/23/2017  . Hyperlipidemia   . Hypertension    Diagnosed 10 years ago. No on medication. Denies history of CAD.     Past Surgical History:  Procedure Laterality Date  . OLECRANON BURSECTOMY Right 08/18/2016   Procedure: OLECRANON BURSECTOMY;  Surgeon: Meredith Pel, MD;  Location: [facility];  Service: Orthopedics;  Laterality: Right;    Family History  Problem Relation Age of Onset  . Hypertension Mother   . Dementia Mother        17s  . Transient ischemic attack Mother        74s  . Kidney disease Father   . COPD Father   . Hypertension Brother   . Alcohol abuse Maternal Grandfather   . Mental illness Paternal Grandmother   . Hypertension Daughter   . Healthy Son   . Learning disabilities Son   . Lung cancer Maternal Aunt   . Colon cancer Neg Hx   . Colon polyps Neg Hx   .  Esophageal cancer Neg Hx   . Rectal cancer Neg Hx   . Stomach cancer Neg Hx     Social History   Socioeconomic History  . Marital status: Married    Spouse name: Not on file  . Number of children: 3  . Years of education: Not on file  . Highest education level: 12th grade  Occupational History  . Occupation: Retired    Comment: Manufacturing systems engineer  . Financial resource strain: Not on file  . Food insecurity    Worry: Not on file    Inability: Not on file  . Transportation needs    Medical: Not on file    Non-medical: Not on file  Tobacco Use  . Smoking status: Current Every Day Smoker    Packs/day: 0.50    Years: 50.00     Pack years: 25.00    Types: Cigarettes    Start date: 08/12/1966  . Smokeless tobacco: Never Used  Substance and Sexual Activity  . Alcohol use: No  . Drug use: No  . Sexual activity: Yes  Lifestyle  . Physical activity    Days per week: Not on file    Minutes per session: Not on file  . Stress: Not on file  Relationships  . Social Herbalist on phone: Not on file    Gets together: Not on file    Attends religious service: Not on file    Active member of club or organization: Not on file    Attends meetings of clubs or organizations: Not on file    Relationship status: Not on file  . Intimate partner violence    Fear of current or ex partner: Not on file    Emotionally abused: Not on file    Physically abused: Not on file    Forced sexual activity: Not on file  Other Topics Concern  . Not on file  Social History Narrative   Social Review      Patient lives at home with. Spouse Burlon Centrella   Pt lives in a one or two story home? One story home   Does patient lives in a facility? If so where? Private home   What is patient highest level of education? 12 grade   Pt is RIGHT handed    Review of systems: Review of Systems  Constitutional: Negative for fever and chills.  HENT: Negative.   Eyes: Negative for blurred vision.  Respiratory: as per HPI  Cardiovascular: Negative for chest pain and palpitations.  Gastrointestinal: Negative for vomiting, diarrhea, blood per rectum. Genitourinary: Negative for dysuria, urgency, frequency and hematuria.  Musculoskeletal: Negative for myalgias, back pain and joint pain.  Skin: Negative for itching and rash.  Neurological: Negative for dizziness, tremors, focal weakness, seizures and loss of consciousness.  Endo/Heme/Allergies: Negative for environmental allergies.  Psychiatric/Behavioral: Negative for depression, suicidal ideas and hallucinations.  All other systems reviewed and are negative.  Physical Exam: Blood  pressure (!) 146/88, pulse 68, temperature 97.7 F (36.5 C), temperature source Temporal, height '5\' 11"'  (1.803 m), weight 166 lb 6.4 oz (75.5 kg), SpO2 99 %. Gen:      No acute distress HEENT:  EOMI, sclera anicteric Neck:     No masses; no thyromegaly Lungs:    Clear to auscultation bilaterally; normal respiratory effort CV:         Regular rate and rhythm; no murmurs Abd:      + bowel sounds; soft, non-tender; no palpable  masses, no distension Ext:    No edema; adequate peripheral perfusion Skin:      Warm and dry; no rash Neuro: alert and oriented x 3 Psych: normal mood and affect  Data Reviewed: Imaging: CT chest 09/30/2016-mild emphysema.  2.9 cm granuloma at left base.  Biapical pleural scarring. CT chest 03/10/2018-mild emphysema.  Subcentimeter pulmonary nodule with maximum diameter of 3.9 mm. I have reviewed the images personally.  Labs: CBC 06/15/2018-WBC 6.3, eos 4.3%, absolute eosinophil count 271  Assessment:  Mild emphysema Noted on screening CT of the chest.  As he is asymptomatic we will defer PFTs and inhaler therapy  Active smoker Smoking cessation discussed with patient and wife.  He is interested in quitting Try nicotine patches and Chantix.  He is also using nicotine lozenges Reassess at return visit.  Time spent counseling- 5 mins  Continue low-dose screening CTs of the chest  Plan/Recommendations: - Smoking cessation with nicotine patches, Chantix  Marshell Garfinkel MD  Pulmonary and Critical Care 09/22/2018, 9:44 AM  CC: Brunetta Jeans, PA-C

## 2018-09-23 ENCOUNTER — Telehealth: Payer: Self-pay | Admitting: Pulmonary Disease

## 2018-09-23 NOTE — Telephone Encounter (Signed)
PA was approved from 01/24/18 to 03/26/19. [facility] is aware of the approval. Nothing further needed.

## 2018-09-23 NOTE — Telephone Encounter (Signed)
Medication name and strength: Chantix Starting Pak Provider: Dr. Vaughan Browner [facility]: [facility] [address] Patient insurance ID:    Was the PA started on CMM?  Yes If yes, please enter the Key: [id number] Timeframe for approval/denial: up to 24 hours   Will update this encounter once I have received a response.

## 2018-10-19 ENCOUNTER — Ambulatory Visit (INDEPENDENT_AMBULATORY_CARE_PROVIDER_SITE_OTHER): Payer: Medicare HMO | Admitting: Physician Assistant

## 2018-10-19 ENCOUNTER — Other Ambulatory Visit: Payer: Self-pay

## 2018-10-19 ENCOUNTER — Encounter: Payer: Self-pay | Admitting: Physician Assistant

## 2018-10-19 VITALS — BP 140/90 | HR 64 | Temp 98.2°F | Resp 16 | Ht 71.0 in | Wt 165.0 lb

## 2018-10-19 DIAGNOSIS — I10 Essential (primary) hypertension: Secondary | ICD-10-CM

## 2018-10-19 DIAGNOSIS — Z23 Encounter for immunization: Secondary | ICD-10-CM

## 2018-10-19 LAB — BASIC METABOLIC PANEL
BUN: 22 mg/dL (ref 6–23)
CO2: 31 mEq/L (ref 19–32)
Calcium: 9.7 mg/dL (ref 8.4–10.5)
Chloride: 98 mEq/L (ref 96–112)
Creatinine, Ser: 1.17 mg/dL (ref 0.40–1.50)
GFR: 61.14 mL/min (ref >60.00)
Glucose, Bld: 113 mg/dL — ABNORMAL HIGH (ref 70–99)
Potassium: 4.4 mEq/L (ref 3.5–5.1)
Sodium: 137 mEq/L (ref 135–145)

## 2018-10-19 NOTE — Patient Instructions (Signed)
Please go to the lab today for blood work.  I will call you with your results. We will alter treatment regimen(s) if indicated by your results.   Continue medications for now.  Keep a low salt diet. I will call with lab results and we will either increase your HCTZ dose or tweak regimen altogether.    DASH Eating Plan DASH stands for "Dietary Approaches to Stop Hypertension." The DASH eating plan is a healthy eating plan that has been shown to reduce high blood pressure (hypertension). It may also reduce your risk for type 2 diabetes, heart disease, and stroke. The DASH eating plan may also help with weight loss. What are tips for following this plan?  General guidelines  Avoid eating more than 2,300 mg (milligrams) of salt (sodium) a day. If you have hypertension, you may need to reduce your sodium intake to 1,500 mg a day.  Limit alcohol intake to no more than 1 drink a day for nonpregnant women and 2 drinks a day for men. One drink equals 12 oz of beer, 5 oz of wine, or 1 oz of hard liquor.  Work with your health care provider to maintain a healthy body weight or to lose weight. Ask what an ideal weight is for you.  Get at least 30 minutes of exercise that causes your heart to beat faster (aerobic exercise) most days of the week. Activities may include walking, swimming, or biking.  Work with your health care provider or diet and nutrition specialist (dietitian) to adjust your eating plan to your individual calorie needs. Reading food labels   Check food labels for the amount of sodium per serving. Choose foods with less than 5 percent of the Daily Value of sodium. Generally, foods with less than 300 mg of sodium per serving fit into this eating plan.  To find whole grains, look for the word "whole" as the first word in the ingredient list. Shopping  Buy products labeled as "low-sodium" or "no salt added."  Buy fresh foods. Avoid canned foods and premade or frozen meals.  Cooking  Avoid adding salt when cooking. Use salt-free seasonings or herbs instead of table salt or sea salt. Check with your health care provider or pharmacist before using salt substitutes.  Do not fry foods. Cook foods using healthy methods such as baking, boiling, grilling, and broiling instead.  Cook with heart-healthy oils, such as olive, canola, soybean, or sunflower oil. Meal planning  Eat a balanced diet that includes: ? 5 or more servings of fruits and vegetables each day. At each meal, try to fill half of your plate with fruits and vegetables. ? Up to 6-8 servings of whole grains each day. ? Less than 6 oz of lean meat, poultry, or fish each day. A 3-oz serving of meat is about the same size as a deck of cards. One egg equals 1 oz. ? 2 servings of low-fat dairy each day. ? A serving of nuts, seeds, or beans 5 times each week. ? Heart-healthy fats. Healthy fats called Omega-3 fatty acids are found in foods such as flaxseeds and coldwater fish, like sardines, salmon, and mackerel.  Limit how much you eat of the following: ? Canned or prepackaged foods. ? Food that is high in trans fat, such as fried foods. ? Food that is high in saturated fat, such as fatty meat. ? Sweets, desserts, sugary drinks, and other foods with added sugar. ? Full-fat dairy products.  Do not salt foods before eating.  Try to eat at least 2 vegetarian meals each week.  Eat more home-cooked food and less restaurant, buffet, and fast food.  When eating at a restaurant, ask that your food be prepared with less salt or no salt, if possible. What foods are recommended? The items listed may not be a complete list. Talk with your dietitian about what dietary choices are best for you. Grains Whole-grain or whole-wheat bread. Whole-grain or whole-wheat pasta. Brown rice. [place]. Bulgur. Whole-grain and low-sodium cereals. Pita bread. Low-fat, low-sodium crackers. Whole-wheat flour tortillas.  Vegetables Fresh or frozen vegetables (raw, steamed, roasted, or grilled). Low-sodium or reduced-sodium tomato and vegetable juice. Low-sodium or reduced-sodium tomato sauce and tomato paste. Low-sodium or reduced-sodium canned vegetables. Fruits All fresh, dried, or frozen fruit. Canned fruit in natural juice (without added sugar). Meat and other protein foods Skinless chicken or [place]. Ground chicken or [place]. Pork with fat trimmed off. Fish and seafood. Egg whites. Dried beans, peas, or lentils. Unsalted nuts, nut butters, and seeds. Unsalted canned beans. Lean cuts of beef with fat trimmed off. Low-sodium, lean deli meat. Dairy Low-fat (1%) or fat-free (skim) milk. Fat-free, low-fat, or reduced-fat cheeses. Nonfat, low-sodium ricotta or cottage cheese. Low-fat or nonfat yogurt. Low-fat, low-sodium cheese. Fats and oils Soft margarine without trans fats. Vegetable oil. Low-fat, reduced-fat, or light mayonnaise and salad dressings (reduced-sodium). Canola, safflower, olive, soybean, and sunflower oils. Avocado. Seasoning and other foods Herbs. Spices. Seasoning mixes without salt. Unsalted popcorn and pretzels. Fat-free sweets. What foods are not recommended? The items listed may not be a complete list. Talk with your dietitian about what dietary choices are best for you. Grains Baked goods made with fat, such as croissants, muffins, or some breads. Dry pasta or rice meal packs. Vegetables Creamed or fried vegetables. Vegetables in a cheese sauce. Regular canned vegetables (not low-sodium or reduced-sodium). Regular canned tomato sauce and paste (not low-sodium or reduced-sodium). Regular tomato and vegetable juice (not low-sodium or reduced-sodium). Angie Fava. Olives. Fruits Canned fruit in a light or heavy syrup. Fried fruit. Fruit in cream or butter sauce. Meat and other protein foods Fatty cuts of meat. Ribs. Fried meat. Berniece Salines. Sausage. Bologna and other processed lunch meats. Salami.  Fatback. Hotdogs. Bratwurst. Salted nuts and seeds. Canned beans with added salt. Canned or smoked fish. Whole eggs or egg yolks. Chicken or [place] with skin. Dairy Whole or 2% milk, cream, and half-and-half. Whole or full-fat cream cheese. Whole-fat or sweetened yogurt. Full-fat cheese. Nondairy creamers. Whipped toppings. Processed cheese and cheese spreads. Fats and oils Butter. Stick margarine. Lard. Shortening. Ghee. Bacon fat. Tropical oils, such as coconut, palm kernel, or palm oil. Seasoning and other foods Salted popcorn and pretzels. Onion salt, garlic salt, seasoned salt, table salt, and sea salt. Worcestershire sauce. Tartar sauce. Barbecue sauce. Teriyaki sauce. Soy sauce, including reduced-sodium. Steak sauce. Canned and packaged gravies. Fish sauce. Oyster sauce. Cocktail sauce. Horseradish that you find on the shelf. Ketchup. Mustard. Meat flavorings and tenderizers. Bouillon cubes. Hot sauce and Tabasco sauce. Premade or packaged marinades. Premade or packaged taco seasonings. Relishes. Regular salad dressings. Where to find more information:  National Heart, Lung, and [place]: https://wilson-eaton.com/  American Heart Association: www.heart.org Summary  The DASH eating plan is a healthy eating plan that has been shown to reduce high blood pressure (hypertension). It may also reduce your risk for type 2 diabetes, heart disease, and stroke.  With the DASH eating plan, you should limit salt (sodium) intake to 2,300 mg a day. If  you have hypertension, you may need to reduce your sodium intake to 1,500 mg a day.  When on the DASH eating plan, aim to eat more fresh fruits and vegetables, whole grains, lean proteins, low-fat dairy, and heart-healthy fats.  Work with your health care provider or diet and nutrition specialist (dietitian) to adjust your eating plan to your individual calorie needs. This information is not intended to replace advice given to you by your health care  provider. Make sure you discuss any questions you have with your health care provider. Document Released: 01/01/2011 Document Revised: 12/25/2016 Document Reviewed: 01/06/2016 Elsevier Patient Education  2020 [employer].

## 2018-10-19 NOTE — Progress Notes (Signed)
Patient presents to clinic today with his wife for hypertension follow-up.  Patient currently on a regimen of losartan 100 mg daily and hydrochlorothiazide 12.5 mg daily.  Patient and wife endorse that patient takes medications as directed.  Is following a low-salt diet.  Is not consistent with hydration.  Denies alcohol consumption.  Minimal caffeine intake.  At last visit blood pressure was stable so regimen was continued without change.  Patient endorses daughter does check blood pressure occasionally at home.  Notes blood pressure averaging from normal levels up into 628B systolic.  Has had one isolated elevated blood pressure at 182/92.  States he feels like his blood pressures are elevated sometimes when daughter checks because he is frustrated by the things that she says to him. Patient denies chest pain, palpitations, lightheadedness, dizziness, vision changes or frequent headaches.  BP Readings from Last 3 Encounters:  10/19/18 140/90  09/22/18 (!) 146/88  09/15/18 138/88     Past Medical History:  Diagnosis Date  . Arthritis   . Closed fracture dislocation of right elbow    30 years ago -- fell out of theback of a truck  . Diabetes mellitus without complication ([facility])   . Hx of colonic polyps 02/23/2017  . Hyperlipidemia   . Hypertension    Diagnosed 10 years ago. No on medication. Denies history of CAD.     Current Outpatient Medications on File Prior to Visit  Medication Sig Dispense Refill  . acetaminophen (TYLENOL) 500 MG tablet Take 500-1,000 mg by mouth every 6 (six) hours as needed (for pain.).    Marland Kitchen aspirin 81 MG chewable tablet Chew 81 mg by mouth daily.    . Blood Glucose Monitoring Suppl ([place]) w/Device KIT Use to test blood sugars one time daily. 1 kit 1  . glucose blood (ONETOUCH VERIO) test strip Use as instructed to test blood sugars one time daily. Dx Code E11.65 100 each 12  . hydrochlorothiazide (MICROZIDE) 12.5 MG capsule Take 1 capsule  (12.5 mg total) by mouth daily. 30 capsule 1  . losartan (COZAAR) 100 MG tablet Take 1 tablet (100 mg total) by mouth daily. 30 tablet 3  . metFORMIN (GLUCOPHAGE) 500 MG tablet TAKE 1 TABLET BY MOUTH ONCE DAILY IN THE MORNING AND  2  TABLETS  IN  THE  EVENING 270 tablet 0  . nicotine (NICODERM CQ - DOSED IN MG/24 HOURS) 14 mg/24hr patch Place 1 patch (14 mg total) onto the skin daily. 28 patch 1  . ONE TOUCH LANCETS MISC One Touch Delica 15V lancets Check blood sugars daily Dx:E11.65 100 each 3  . varenicline (CHANTIX PAK) 0.5 MG X 11 & 1 MG X 42 tablet Take one 0.5 mg tablet by mouth once daily for 3 days, then increase to one 0.5 mg tablet twice daily for 4 days, then increase to one 1 mg tablet twice daily. 53 tablet 1   No current facility-administered medications on file prior to visit.     Allergies  Allergen Reactions  . Antibacterial Hand Soap [Triclosan]     Water blisters    Family History  Problem Relation Age of Onset  . Hypertension Mother   . Dementia Mother        61s  . Transient ischemic attack Mother        27s  . Kidney disease Father   . COPD Father   . Hypertension Brother   . Alcohol abuse Maternal Grandfather   . Mental illness  Paternal Grandmother   . Hypertension Daughter   . Healthy Son   . Learning disabilities Son   . Lung cancer Maternal Aunt   . Colon cancer Neg Hx   . Colon polyps Neg Hx   . Esophageal cancer Neg Hx   . Rectal cancer Neg Hx   . Stomach cancer Neg Hx     Social History   Socioeconomic History  . Marital status: Married    Spouse name: Not on file  . Number of children: 3  . Years of education: Not on file  . Highest education level: 12th grade  Occupational History  . Occupation: Retired    Comment: Manufacturing systems engineer  . Financial resource strain: Not on file  . Food insecurity    Worry: Not on file    Inability: Not on file  . Transportation needs    Medical: Not on file    Non-medical: Not on file  Tobacco  Use  . Smoking status: Current Every Day Smoker    Packs/day: 0.50    Years: 50.00    Pack years: 25.00    Types: Cigarettes    Start date: 08/12/1966  . Smokeless tobacco: Never Used  Substance and Sexual Activity  . Alcohol use: No  . Drug use: No  . Sexual activity: Yes  Lifestyle  . Physical activity    Days per week: Not on file    Minutes per session: Not on file  . Stress: Not on file  Relationships  . Social Herbalist on phone: Not on file    Gets together: Not on file    Attends religious service: Not on file    Active member of club or organization: Not on file    Attends meetings of clubs or organizations: Not on file    Relationship status: Not on file  Other Topics Concern  . Not on file  Social History Narrative   Social Review      Patient lives at home with. Spouse Ollin Hochmuth   Pt lives in a one or two story home? One story home   Does patient lives in a facility? If so where? Private home   What is patient highest level of education? 12 grade   Pt is RIGHT handed   Review of Systems - See HPI.  All other ROS are negative.  BP 140/90   Pulse 64   Temp 98.2 F (36.8 C) (Skin)   Resp 16   Ht '5\' 11"'  (1.803 m)   Wt 165 lb (74.8 kg)   SpO2 93%   BMI 23.01 kg/m   Physical Exam Vitals signs reviewed.  Constitutional:      Appearance: Normal appearance.  HENT:     Head: Normocephalic and atraumatic.     Mouth/Throat:     Mouth: Mucous membranes are moist.  Neck:     Musculoskeletal: Neck supple.  Cardiovascular:     Rate and Rhythm: Normal rate and regular rhythm.     Pulses: Normal pulses.     Heart sounds: Normal heart sounds.  Pulmonary:     Effort: Pulmonary effort is normal.     Breath sounds: Normal breath sounds.  Neurological:     Mental Status: He is alert.  Psychiatric:        Mood and Affect: Mood normal.     Recent Results (from the past 2160 hour(s))  [facility] DIABETES EYE EXAM     Status: None  Collection Time:  08/03/18 12:00 AM  Result Value Ref Range   [facility] Diabetic Eye Exam No Retinopathy No Retinopathy  Basic metabolic panel     Status: Abnormal   Collection Time: 09/15/18 10:24 AM  Result Value Ref Range   Sodium 137 135 - 145 mEq/L   Potassium 4.4 3.5 - 5.1 mEq/L   Chloride 100 96 - 112 mEq/L   CO2 25 19 - 32 mEq/L   Glucose, Bld 107 (H) 70 - 99 mg/dL   BUN 18 6 - 23 mg/dL   Creatinine, Ser 1.10 0.40 - 1.50 mg/dL   Calcium 9.5 8.4 - 10.5 mg/dL   GFR 65.67 >60.00 mL/min    Assessment/Plan: 1. Essential hypertension Blood pressure overall stable.  Is in acceptable range here in the office, especially for 73 year old individual.  Would like it to stay at least in the 130s giving diabetes.  Will check BMP today to assess potassium levels.  He is to continue DASH diet.  Will consider increasing HCTZ to 25 mg based on lab findings. - Basic metabolic panel  2. Need for immunization against influenza High-dose flu shot given today - Flu Vaccine QUAD High Dose(Fluad)   Leeanne Rio, PA-C

## 2018-10-21 ENCOUNTER — Other Ambulatory Visit: Payer: Self-pay | Admitting: Emergency Medicine

## 2018-10-21 DIAGNOSIS — I10 Essential (primary) hypertension: Secondary | ICD-10-CM

## 2018-10-21 MED ORDER — POTASSIUM CHLORIDE CRYS ER 20 MEQ PO TBCR: 20.0000 meq | EXTENDED_RELEASE_TABLET | Freq: Every day | ORAL | 1 refills | Status: DC

## 2018-10-21 MED ORDER — HYDROCHLOROTHIAZIDE 25 MG PO TABS: 25.0000 mg | ORAL_TABLET | Freq: Every day | ORAL | 1 refills | Status: DC

## 2018-11-09 ENCOUNTER — Encounter: Payer: Self-pay | Admitting: Physician Assistant

## 2018-11-09 ENCOUNTER — Other Ambulatory Visit: Payer: Self-pay

## 2018-11-09 ENCOUNTER — Ambulatory Visit (INDEPENDENT_AMBULATORY_CARE_PROVIDER_SITE_OTHER): Payer: Medicare HMO | Admitting: Physician Assistant

## 2018-11-09 VITALS — BP 128/76

## 2018-11-09 DIAGNOSIS — I10 Essential (primary) hypertension: Secondary | ICD-10-CM

## 2018-11-09 NOTE — Progress Notes (Signed)
I have discussed the procedure for the virtual visit with the patient who has given consent to proceed with assessment and treatment.   Keylah Darwish S Makyle Eslick, CMA     

## 2018-11-09 NOTE — Progress Notes (Signed)
Virtual Visit via Video   I connected with patient on 11/09/18 at  9:00 AM EDT by a video enabled telemedicine application and verified that I am speaking with the correct person using two identifiers.  Location patient: Home Location provider: [facility], Office Persons participating in the virtual visit: Patient, Provider, [place] (Patina Moore)  I discussed the limitations of evaluation and management by telemedicine and the availability of in person appointments. The patient expressed understanding and agreed to proceed.  Subjective:   HPI:   Patient presents via [facility]Me today for follow-up of hypertension after increase in HCTZ to 25 mg daily. Is continuing to take his losartan 100 mg QD. Is taking his potassium supplement as directed.  Tolerating medications. Feels better since change in regimen. Patient denies chest pain, palpitations, lightheadedness, dizziness, vision changes or frequent headaches.  BP Readings from Last 3 Encounters:  11/09/18 128/76  10/19/18 140/90  09/22/18 (!) 146/88     ROS:   See pertinent positives and negatives per HPI.  Patient Active Problem List   Diagnosis Date Noted  . Pulmonary emphysema ([place]) 09/22/2018  . Encounter for screening for malignant neoplasm of respiratory organs 02/07/2018  . Screening for AAA (abdominal aortic aneurysm) 02/07/2018  . Skin cancer screening 02/07/2018  . Need for 23-polyvalent pneumococcal polysaccharide vaccine 02/07/2018  . Encounter for immunization 02/07/2018  . Visit for preventive health examination 02/07/2018  . Hx of colonic polyps 02/23/2017  . Hyperlipidemia associated with type 2 diabetes mellitus ([place]) 10/02/2016  . Diabetes mellitus type II, uncontrolled ([place]) 09/18/2016  . Encounter for Medicare annual wellness exam 09/16/2016  . Special screening examination for viral disease 09/16/2016  . Annual physical exam 09/16/2016  . Prostate cancer screening 09/16/2016  . Tobacco abuse  disorder 08/11/2016  . Essential hypertension 08/11/2016    Social History   Tobacco Use  . Smoking status: Current Every Day Smoker    Packs/day: 0.50    Years: 50.00    Pack years: 25.00    Types: Cigarettes    Start date: 08/12/1966  . Smokeless tobacco: Never Used  Substance Use Topics  . Alcohol use: No    Current Outpatient Medications:  .  acetaminophen (TYLENOL) 500 MG tablet, Take 500-1,000 mg by mouth every 6 (six) hours as needed (for pain.)., Disp: , Rfl:  .  aspirin 81 MG chewable tablet, Chew 81 mg by mouth daily., Disp: , Rfl:  .  Blood Glucose Monitoring Suppl ([place]) w/Device KIT, Use to test blood sugars one time daily., Disp: 1 kit, Rfl: 1 .  glucose blood (ONETOUCH VERIO) test strip, Use as instructed to test blood sugars one time daily. Dx Code E11.65, Disp: 100 each, Rfl: 12 .  hydrochlorothiazide (HYDRODIURIL) 25 MG tablet, Take 1 tablet (25 mg total) by mouth daily., Disp: 90 tablet, Rfl: 1 .  losartan (COZAAR) 100 MG tablet, Take 1 tablet (100 mg total) by mouth daily., Disp: 30 tablet, Rfl: 3 .  metFORMIN (GLUCOPHAGE) 500 MG tablet, TAKE 1 TABLET BY MOUTH ONCE DAILY IN THE MORNING AND  2  TABLETS  IN  THE  EVENING, Disp: 270 tablet, Rfl: 0 .  nicotine (NICODERM CQ - DOSED IN MG/24 HOURS) 14 mg/24hr patch, Place 1 patch (14 mg total) onto the skin daily., Disp: 28 patch, Rfl: 1 .  ONE TOUCH LANCETS MISC, One Touch Delica 10F lancets Check blood sugars daily Dx:E11.65, Disp: 100 each, Rfl: 3 .  potassium chloride SA (K-DUR) 20 MEQ  tablet, Take 1 tablet (20 mEq total) by mouth daily., Disp: 90 tablet, Rfl: 1  Allergies  Allergen Reactions  . Antibacterial Hand Soap [Triclosan]     Water blisters    Objective:   BP 128/76 (BP Location: Right Arm, Patient Position: Sitting)   Patient is well-developed, well-nourished in no acute distress.  Resting comfortably at home.  Head is normocephalic, atraumatic.  No labored breathing.  Speech  is clear and coherent with logical content.  Patient is alert and oriented at baseline.   Assessment and Plan:   1. Essential hypertension Much improved. Taking medications and potassium supplement and tolerating well. Follow-up 3 months in-office for reassessment and BMP. Continue DASH diet. Continue nicoderm for smoking cessation. Hopefully with lifestyle changes and smoking cessation we will be able to take him off of some medications or at least lower dosages in the future.     Leeanne Rio, PA-C 11/09/2018

## 2018-11-17 ENCOUNTER — Other Ambulatory Visit: Payer: Self-pay | Admitting: Physician Assistant

## 2018-12-01 ENCOUNTER — Other Ambulatory Visit: Payer: Self-pay | Admitting: *Deleted

## 2018-12-01 DIAGNOSIS — F17210 Nicotine dependence, cigarettes, uncomplicated: Secondary | ICD-10-CM

## 2018-12-01 DIAGNOSIS — Z122 Encounter for screening for malignant neoplasm of respiratory organs: Secondary | ICD-10-CM

## 2018-12-01 DIAGNOSIS — Z87891 Personal history of nicotine dependence: Secondary | ICD-10-CM

## 2018-12-16 ENCOUNTER — Other Ambulatory Visit: Payer: Self-pay | Admitting: Physician Assistant

## 2019-02-08 ENCOUNTER — Ambulatory Visit: Payer: Medicare HMO | Admitting: Neurology

## 2019-03-26 ENCOUNTER — Ambulatory Visit: Payer: Medicare HMO | Attending: Internal Medicine

## 2019-03-26 DIAGNOSIS — Z23 Encounter for immunization: Secondary | ICD-10-CM | POA: Insufficient documentation

## 2019-03-26 NOTE — Progress Notes (Signed)
   [id number] Vaccination [facility]  Name:  John Woodward.    MRN: [id number] DOB: Aug 04, 1945  03/26/2019  [name] was observed post Covid-19 immunization for 15 minutes without incidence. He was provided with Vaccine Information Sheet and instruction to access the V-Safe system.   [name] was instructed to call 911 with any severe reactions post vaccine: Marland Kitchen Difficulty breathing  . Swelling of your face and throat  . A fast heartbeat  . A bad rash all over your body  . Dizziness and weakness    Immunizations Administered    Name Date Dose VIS Date Route   Pfizer COVID-19 Vaccine 03/26/2019  2:50 PM 0.3 mL 01/06/2019 Intramuscular   Manufacturer: [place]   Lot: [id number]   [place]: [id number]

## 2019-04-14 ENCOUNTER — Other Ambulatory Visit: Payer: Self-pay | Admitting: Physician Assistant

## 2019-04-14 DIAGNOSIS — I10 Essential (primary) hypertension: Secondary | ICD-10-CM

## 2019-04-26 ENCOUNTER — Ambulatory Visit: Payer: Medicare HMO | Attending: Internal Medicine

## 2019-04-26 DIAGNOSIS — Z23 Encounter for immunization: Secondary | ICD-10-CM

## 2019-04-26 NOTE — Progress Notes (Signed)
   [id number] Vaccination Clinic  Name:  John Woodward.    MRN: [id number] DOB: [birth year]  04/26/2019  [name] was observed post Covid-19 immunization for 15 minutes without incident. He was provided with Vaccine Information Sheet and instruction to access the V-Safe system.   [name] was instructed to call 911 with any severe reactions post vaccine: Marland Kitchen Difficulty breathing  . Swelling of face and throat  . A fast heartbeat  . A bad rash all over body  . Dizziness and weakness   Immunizations Administered    Name Date Dose VIS Date Route   Pfizer COVID-19 Vaccine 04/26/2019  9:41 AM 0.3 mL 01/06/2019 Intramuscular   Manufacturer: [place]   Lot: [id number]   [place]: [id number]

## 2019-05-02 ENCOUNTER — Other Ambulatory Visit: Payer: Self-pay | Admitting: Physician Assistant

## 2019-05-02 DIAGNOSIS — I10 Essential (primary) hypertension: Secondary | ICD-10-CM

## 2019-05-10 ENCOUNTER — Telehealth: Payer: Self-pay | Admitting: Physician Assistant

## 2019-05-10 NOTE — Progress Notes (Signed)
  Chronic Care Management   Note  05/10/2019 Name: John Woodward. MRN: [id number] DOB: 1945/08/30  John Woodward. is a 74 y.o. year old male who is a primary care patient of John Woodward. I reached out to John Woodward. by phone today in response to a referral sent by [name]'s PCP, John Jeans, John Woodward.   [name] was given information about Chronic Care Management services today including:  1. CCM service includes personalized support from designated clinical staff supervised by his physician, including individualized plan of care and coordination with other care providers 2. 24/7 contact phone numbers for assistance for urgent and routine care needs. 3. Service will only be billed when office clinical staff spend 20 minutes or more in a month to coordinate care. 4. Only one practitioner may furnish and bill the service in a calendar month. 5. The patient may stop CCM services at any time (effective at the end of the month) by phone call to the office staff.   Patient agreed to services and verbal consent obtained.   Follow up plan:   Earney [facility]

## 2019-05-19 ENCOUNTER — Other Ambulatory Visit: Payer: Self-pay | Admitting: General Practice

## 2019-05-19 DIAGNOSIS — E1165 Type 2 diabetes mellitus with hyperglycemia: Secondary | ICD-10-CM

## 2019-05-19 DIAGNOSIS — E785 Hyperlipidemia, unspecified: Secondary | ICD-10-CM

## 2019-05-19 DIAGNOSIS — E1169 Type 2 diabetes mellitus with other specified complication: Secondary | ICD-10-CM

## 2019-05-19 DIAGNOSIS — I10 Essential (primary) hypertension: Secondary | ICD-10-CM

## 2019-05-26 ENCOUNTER — Other Ambulatory Visit: Payer: Self-pay | Admitting: Physician Assistant

## 2019-06-07 ENCOUNTER — Other Ambulatory Visit: Payer: Self-pay

## 2019-06-07 ENCOUNTER — Ambulatory Visit: Payer: Medicare HMO

## 2019-06-07 ENCOUNTER — Telehealth: Payer: Self-pay

## 2019-06-07 DIAGNOSIS — E1165 Type 2 diabetes mellitus with hyperglycemia: Secondary | ICD-10-CM

## 2019-06-07 DIAGNOSIS — I10 Essential (primary) hypertension: Secondary | ICD-10-CM

## 2019-06-07 DIAGNOSIS — E1169 Type 2 diabetes mellitus with other specified complication: Secondary | ICD-10-CM

## 2019-06-07 DIAGNOSIS — E785 Hyperlipidemia, unspecified: Secondary | ICD-10-CM

## 2019-06-07 MED ORDER — METFORMIN HCL 500 MG PO TABS: ORAL_TABLET | ORAL | 0 refills | Status: DC

## 2019-06-07 MED ORDER — LOSARTAN POTASSIUM 100 MG PO TABS: 100.0000 mg | ORAL_TABLET | Freq: Every day | ORAL | 0 refills | Status: DC

## 2019-06-07 MED ORDER — HYDROCHLOROTHIAZIDE 25 MG PO TABS: 25.0000 mg | ORAL_TABLET | Freq: Every day | ORAL | 0 refills | Status: DC

## 2019-06-07 MED ORDER — POTASSIUM CHLORIDE CRYS ER 20 MEQ PO TBCR: 20.0000 meq | EXTENDED_RELEASE_TABLET | Freq: Every day | ORAL | 0 refills | Status: DC

## 2019-06-07 NOTE — Telephone Encounter (Signed)
Could you please send these prescriptions to [facility] for John Woodward when you have a chance. Thank you for the help.   Losartan 100 mg   Potassium chloride micro ER 20 meq   Hydrochlorothiazide 25 mg   Metformin 500 mg

## 2019-06-07 NOTE — Progress Notes (Signed)
Chronic Care Management Pharmacy  Name: Sorren Pacis.  MRN: [id number] DOB: 1945-04-07  Chief Complaint/ HPI  John Woodward.,  74 y.o. , male presents for their Initial CCM visit with the clinical pharmacist via telephone due to COVID-19 Pandemic. Caregiver is wife, [place], who handles patients medications.   PCP : Brunetta Jeans, PA-C  Their chronic conditions include: HLD, HTN, DM type 2.    Office Visits: 10/30/2018 (PCP): improved HTN after increasing hctz to 25 mg daily, on DASH diet, continue nicoderm.   Past Medical History:  Diagnosis Date  . Arthritis   . Closed fracture dislocation of right elbow    30 years ago -- fell out of theback of a truck  . Diabetes mellitus without complication ([place])   . Hx of colonic polyps 02/23/2017  . Hyperlipidemia   . Hypertension    Diagnosed 10 years ago. No on medication. Denies history of CAD.    Social History   Socioeconomic History  . Marital status: Married    Spouse name: Not on file  . Number of children: 3  . Years of education: Not on file  . Highest education level: 12th grade  Occupational History  . Occupation: Retired    Comment: Dealer  Tobacco Use  . Smoking status: Current Every Day Smoker    Packs/day: 0.50    Years: 50.00    Pack years: 25.00    Types: Cigarettes    Start date: 08/12/1966  . Smokeless tobacco: Never Used  Substance and Sexual Activity  . Alcohol use: No  . Drug use: No  . Sexual activity: Yes  Other Topics Concern  . Not on file  Social History Narrative   Social Review      Patient lives at home with. Spouse Osvaldo Agler   Pt lives in a one or two story home? One story home   Does patient lives in a facility? If so where? Private home   What is patient highest level of education? 12 grade   Pt is RIGHT handed   Social Determinants of Health   Financial Resource Strain:   . Difficulty of Paying Living Expenses:   Food Insecurity: No Food Insecurity  .  Worried About Charity fundraiser in the Last Year: Never true  . Ran Out of Food in the Last Year: Never true  Transportation Needs: No Transportation Needs  . Lack of Transportation (Medical): No  . Lack of Transportation (Non-Medical): No  Physical Activity:   . Days of Exercise per Week:   . Minutes of Exercise per Session:   Stress:   . Feeling of Stress :   Social Connections:   . Frequency of Communication with Friends and Family:   . Frequency of Social Gatherings with Friends and Family:   . Attends Religious Services:   . Active Member of Clubs or Organizations:   . Attends Archivist Meetings:   Marland Kitchen Marital Status:    Allergies  Allergen Reactions  . Antibacterial Hand Soap [Triclosan]     Water blisters   Current Meds  Medication Sig  . acetaminophen (TYLENOL) 500 MG tablet Take 500-1,000 mg by mouth every 6 (six) hours as needed (for pain.).  Marland Kitchen aspirin 81 MG chewable tablet Chew 81 mg by mouth daily.  . hydrochlorothiazide (HYDRODIURIL) 25 MG tablet Take 1 tablet (25 mg total) by mouth daily. Needs follow-up for further refills.  Marland Kitchen losartan (COZAAR) 100 MG tablet Take 1  tablet by mouth once daily  . metFORMIN (GLUCOPHAGE) 500 MG tablet TAKE 1 TABLET BY MOUTH ONCE DAILY IN THE MORNING AND  2 TABLETS IN THE EVENING  . nicotine polacrilex (COMMIT) 2 MG lozenge Take 2 mg by mouth as needed for smoking cessation. 6 lozenges/day.  . potassium chloride SA (KLOR-CON) 20 MEQ tablet Take 1 tablet by mouth once daily   Current Diagnosis/Assessment: Goals Addressed            This Visit's Progress   . PharmD Care Plan       CARE PLAN ENTRY  Current Barriers:  . Chronic Disease Management support, education, and care coordination needs related to Hypertension, Hyperlipidemia, and Diabetes   Hypertension . Pharmacist Clinical Goal(s): o Over the next 180 days, patient will work with PharmD and providers to maintain BP goal <130/80 . Current regimen:    Hydrochlorothiazide 25 mg daily   Losartan 100 mg daily   Potassium chloride ER 20 meq daily  . Interventions: o Monitoring plan  . Patient self care activities - Over the next 180 days, patient will: o Check BP at least once every 1-2 weeks, document, and provide at future appointments o Ensure daily salt intake < 2300 mg/day  Hyperlipidemia . Pharmacist Clinical Goal(s): o Over the next 180 days, patient will work with PharmD and providers to achieve LDL goal <100 . Current regimen:  o No medications  . Interventions: o None. . Patient self care activities - Over the next 30 days, patient will: o Continue current management with diet, consider starting on a different statin medication.  Diabetes . Pharmacist Clinical Goal(s): o Over the next 180 days, patient will work with PharmD and providers to maintain A1c goal <7% . Current regimen:  o Metformin 500 mg tablet - 1 tablet in morning, 2 tablets in evening.  . Interventions: o Continue current management . Patient self care activities - Over the next 180 days, patient will: o Check blood sugar first thing in the morning at least once every 2 weeks, document, and provide at future appointments o Contact provider with any episodes of hypoglycemia  Medication management . Pharmacist Clinical Goal(s): o Over the next 180 days, patient will work with PharmD and providers to achieve optimal medication adherence . Current pharmacy: [facility] . Interventions o Comprehensive medication review performed. o Utilize [facility] for medication synchronization, packaging and delivery . Patient self care activities - Over the next 180 days, patient will: o Focus on medication adherence by using pill packaging  o Take medications as prescribed o Report any questions or concerns to PharmD and/or provider(s) Initial goal documentation.      Hypertension   BP Readings from Last 3 Encounters:  11/09/18 128/76  10/19/18  140/90  09/22/18 (!) 146/88   BMP Latest Ref Rng & Units 10/19/2018 09/15/2018 07/14/2018  Glucose 70 - 99 mg/dL 113(H) 107(H) 116(H)  BUN 6 - 23 mg/dL 22 18 26(H)  Creatinine 0.40 - 1.50 mg/dL 1.17 1.10 1.22  BUN/Creat Ratio 6 - 22 (calc) - - -  Sodium 135 - 145 mEq/L 137 137 141  Potassium 3.5 - 5.1 mEq/L 4.4 4.4 4.7  Chloride 96 - 112 mEq/L 98 100 105  CO2 19 - 32 mEq/L 31 25 30   Calcium 8.4 - 10.5 mg/dL 9.7 9.5 9.3   Patient checks BP at home infrequently. Patient home BP readings are ranging: n/a.  We discussed monitoring BP using upper arm cuff at home at least once every  1-2 weeks and record in log and provide at future visits. Denies dizziness, SOB, chest pain. Potassium remains stable in context of 2020 labs.   Patient is currently controlled on the following medications:   HCTZ 25 mg daily   Losartan 100 mg daily   Potassium chloride ER 20 meq daily   Plan  Continue current medications and control with diet and exercise.   Diabetes   Recent Relevant Labs:  Lab Results  Component Value Date   HGBA1C 6.4 07/14/2018   HGBA1C 6.6 (H) 02/04/2018   HGBA1C 6.8 (H) 08/05/2017   HGBA1C 7.2 (H) 12/11/2016   HGBA1C 11.9 (H) 09/11/2016   MICROALBUR 44.2 (H) 09/18/2016   A1c has ranged 6.4-11.9 since (08/2016) with most recent 6.4 (07/14/2018). No Hx of previous DM medications.  Currently follows low carb diet  Checking BG: infrequently due to well controlled DM.  Patient is currently controlled on the following medications:   Metformin 500 mg tablet - one tab every morning, two tabs every night  Denies any side effects including diarrhea at this time.  Plan  Continue current medications and control with diet and exercise.     Hyperlipidemia   Lipid Panel     Component Value Date/Time   CHOL 201 (H) 07/14/2018 0856   TRIG 168.0 (H) 07/14/2018 0856   HDL 35.60 (L) 07/14/2018 0856   CHOLHDL 6 07/14/2018 0856   VLDL 33.6 07/14/2018 0856   LDLCALC 132 (H)  07/14/2018 0856   LDLDIRECT 111.0 10/30/2016 0941    The 10-year ASCVD risk score (Goff DC Jr., et al., 2013) is: 52.2%   Values used to calculate the score:     Age: 39 years     Sex: Male     Is Non-Hispanic African American: No     Diabetic: Yes     Tobacco smoker: Yes     Systolic Blood Pressure: 0000000 mmHg     Is BP treated: Yes     HDL Cholesterol: 35.6 mg/dL     Total Cholesterol: 201 mg/dL   Patient has failed these meds in past: pitastatin 2 mg, pravastatin 20/40 mg. Caregiver expresses concern over memory issues/side effects related to statins. Per 06/2018 lipid panel, LDL uncontrolled. Cardiovascular risk factors include HTN, age, DM, current smoker, elevated cholesterol - ascvd 52.2%. High intensity statin indicated for CVD prevention.   Taking aspirin 81 mg for CVD prevention. Denies any abnormal bruising, bleeding from nose or gums or blood in urine or stool.  Plan  Continue current medications, discuss initiation of atorvastatin 40 mg at 75-month f/u.   Tobacco Abuse   Tobacco Use  Smoking Status Current Every Day Smoker  . Packs/day: 0.50  . Years: 50.00  . Pack years: 25.00  . Types: Cigarettes  . Start date: 08/12/1966  Smokeless Tobacco Never Used   Patient has tried these meds in past: Chantix (cost), nicoderm patch (skin irritation). Patient is currently controlled on the following medications:   Nicotine lozenge 2 mg - six lozenges/day   Currently smoking 0.5 packs/day.  Counseled to allow lozenge to dissolve and absorb in cheek pocket, rather than swallow, to reduce GI side effects.  Plan  Continue current medications.  Vaccines   Reviewed and discussed patient's vaccination history.    Immunization History  Administered Date(s) Administered  . Fluad Quad(high Dose 65+) 10/19/2018  . Influenza, High Dose Seasonal PF 10/30/2016, 02/04/2018  . PFIZER SARS-COV-2 Vaccination 03/26/2019, 04/26/2019  . Pneumococcal Conjugate-13 09/10/2016  .  Pneumococcal  Polysaccharide-23 02/04/2018   Plan  Recommended patient receive Shingles vaccine in pharmacy.   Medication Management   Pt uses [facility] pharmacy for all medications Uses pill box? No.  We discussed current medication management, has been having issues getting medications synchronized, would like pill packaging.   Plan  Utilize [facility] for medication synchronization, packaging and delivery.   Follow up: 1 month phone visit  DM, HLD. ______________ Visit Information SDOH (Social Determinants of Health) assessments performed: Yes.  Verbal consent obtained for [facility] enhanced pharmacy services (medication synchronization, adherence packaging, delivery coordination). A medication sync plan was created to allow patient to get all medications delivered once every 30 to 90 days per patient preference. Patient understands they have freedom to choose pharmacy and clinical pharmacist will coordinate care between all prescribers and [facility].   Mr. Robley was given information about Chronic Care Management services today including:  1. CCM service includes personalized support from designated clinical staff supervised by his physician, including individualized plan of care and coordination with other care providers 2. 24/7 contact phone numbers for assistance for urgent and routine care needs. 3. Standard insurance, coinsurance, copays and deductibles apply for chronic care management only during months in which we provide at least 20 minutes of these services. Most insurances cover these services at 100%, however patients may be responsible for any copay, coinsurance and/or deductible if applicable. This service may help you avoid the need for more expensive face-to-face services. 4. Only one practitioner may furnish and bill the service in a calendar month. 5. The patient may stop CCM services at any time (effective at the end of the month) by phone  call to the office staff.  Patient agreed to services and verbal consent obtained.   Madelin Rear, Pharm.D., BCGP Clinical Pharmacist [facility] at [facility] [phone]

## 2019-06-07 NOTE — Patient Instructions (Addendum)
Please call me at [phone] (direct line) with any questions - thank you!  - Edyth Gunnels., Clinical Pharmacist  Goals Addressed            This Visit's Progress   . PharmD Care Plan       CARE PLAN ENTRY  Current Barriers:  . Chronic Disease Management support, education, and care coordination needs related to Hypertension, Hyperlipidemia, and Diabetes   Hypertension . Pharmacist Clinical Goal(s): o Over the next 180 days, patient will work with PharmD and providers to maintain BP goal <130/80 . Current regimen:   Hydrochlorothiazide 25 mg daily   Losartan 100 mg daily   Potassium chloride ER 20 meq daily  . Interventions: o Monitoring plan  . Patient self care activities - Over the next 180 days, patient will: o Check BP at least once every 1-2 weeks, document, and provide at future appointments o Ensure daily salt intake < 2300 mg/day  Hyperlipidemia . Pharmacist Clinical Goal(s): o Over the next 180 days, patient will work with PharmD and providers to achieve LDL goal <100 . Current regimen:  o No medications  . Interventions: o None. . Patient self care activities - Over the next 30 days, patient will: o Continue current management with diet, consider starting on a different statin medication.  Diabetes . Pharmacist Clinical Goal(s): o Over the next 180 days, patient will work with PharmD and providers to maintain A1c goal <7% . Current regimen:  o Metformin 500 mg tablet - 1 tablet in morning, 2 tablets in evening.  . Interventions: o Continue current management . Patient self care activities - Over the next 180 days, patient will: o Check blood sugar first thing in the morning at least once every 2 weeks, document, and provide at future appointments o Contact provider with any episodes of hypoglycemia  Medication management . Pharmacist Clinical Goal(s): o Over the next 180 days, patient will work with PharmD and providers to achieve optimal medication  adherence . Current pharmacy: [facility] . Interventions o Comprehensive medication review performed. o Utilize [facility] for medication synchronization, packaging and delivery . Patient self care activities - Over the next 180 days, patient will: o Focus on medication adherence by using pill packaging  o Take medications as prescribed o Report any questions or concerns to PharmD and/or provider(s) Initial goal documentation.    John Woodward was given information about Chronic Care Management services today including:  1. CCM service includes personalized support from designated clinical staff supervised by his physician, including individualized plan of care and coordination with other care providers 2. 24/7 contact phone numbers for assistance for urgent and routine care needs. 3. Standard insurance, coinsurance, copays and deductibles apply for chronic care management only during months in which we provide at least 20 minutes of these services. Most insurances cover these services at 100%, however patients may be responsible for any copay, coinsurance and/or deductible if applicable. This service may help you avoid the need for more expensive face-to-face services. 4. Only one practitioner may furnish and bill the service in a calendar month. 5. The patient may stop CCM services at any time (effective at the end of the month) by phone call to the office staff.  Patient agreed to services and verbal consent obtained.   The patient verbalized understanding of instructions provided today and agreed to receive a mailed copy of patient instruction and/or educational materials. Telephone follow up appointment with pharmacy team member scheduled for: See next appointment with "  Care Management Staff" under "What's Next" below.   Thank you!  Madelin Rear, Pharm.D., BCGP Clinical Pharmacist [facility] [phone] Hypertension, Adult High blood pressure  (hypertension) is when the force of blood pumping through the arteries is too strong. The arteries are the blood vessels that carry blood from the heart throughout the body. Hypertension forces the heart to work harder to pump blood and may cause arteries to become narrow or stiff. Untreated or uncontrolled hypertension can cause a heart attack, heart failure, a stroke, kidney disease, and other problems. A blood pressure reading consists of a higher number over a lower number. Ideally, your blood pressure should be below 120/80. The first ("top") number is called the systolic pressure. It is a measure of the pressure in your arteries as your heart beats. The second ("bottom") number is called the diastolic pressure. It is a measure of the pressure in your arteries as the heart relaxes. What are the causes? The exact cause of this condition is not known. There are some conditions that result in or are related to high blood pressure. What increases the risk? Some risk factors for high blood pressure are under your control. The following factors may make you more likely to develop this condition:  Smoking.  Having type 2 diabetes mellitus, high cholesterol, or both.  Not getting enough exercise or physical activity.  Being overweight.  Having too much fat, sugar, calories, or salt (sodium) in your diet.  Drinking too much alcohol. Some risk factors for high blood pressure may be difficult or impossible to change. Some of these factors include:  Having chronic kidney disease.  Having a family history of high blood pressure.  Age. Risk increases with age.  Race. You may be at higher risk if you are African American.  Gender. Men are at higher risk than women before age 74. After age 15, women are at higher risk than men.  Having obstructive sleep apnea.  Stress. What are the signs or symptoms? High blood pressure may not cause symptoms. Very high blood pressure (hypertensive crisis) may  cause:  Headache.  Anxiety.  Shortness of breath.  Nosebleed.  Nausea and vomiting.  Vision changes.  Severe chest pain.  Seizures. How is this diagnosed? This condition is diagnosed by measuring your blood pressure while you are seated, with your arm resting on a flat surface, your legs uncrossed, and your feet flat on the floor. The cuff of the blood pressure monitor will be placed directly against the skin of your upper arm at the level of your heart. It should be measured at least twice using the same arm. Certain conditions can cause a difference in blood pressure between your right and left arms. Certain factors can cause blood pressure readings to be lower or higher than normal for a short period of time:  When your blood pressure is higher when you are in a health care provider's office than when you are at home, this is called white coat hypertension. Most people with this condition do not need medicines.  When your blood pressure is higher at home than when you are in a health care provider's office, this is called masked hypertension. Most people with this condition may need medicines to control blood pressure. If you have a high blood pressure reading during one visit or you have normal blood pressure with other risk factors, you may be asked to:  Return on a different day to have your blood  pressure checked again.  Monitor your blood pressure at home for 1 week or longer. If you are diagnosed with hypertension, you may have other blood or imaging tests to help your health care provider understand your overall risk for other conditions. How is this treated? This condition is treated by making healthy lifestyle changes, such as eating healthy foods, exercising more, and reducing your alcohol intake. Your health care provider may prescribe medicine if lifestyle changes are not enough to get your blood pressure under control, and if:  Your systolic blood pressure is above  130.  Your diastolic blood pressure is above 80. Your personal target blood pressure may vary depending on your medical conditions, your age, and other factors. Follow these instructions at home: Eating and drinking   Eat a diet that is high in fiber and potassium, and low in sodium, added sugar, and fat. An example eating plan is called the DASH (Dietary Approaches to Stop Hypertension) diet. To eat this way: ? Eat plenty of fresh fruits and vegetables. Try to fill one half of your plate at each meal with fruits and vegetables. ? Eat whole grains, such as whole-wheat pasta, brown rice, or whole-grain bread. Fill about one fourth of your plate with whole grains. ? Eat or drink low-fat dairy products, such as skim milk or low-fat yogurt. ? Avoid fatty cuts of meat, processed or cured meats, and poultry with skin. Fill about one fourth of your plate with lean proteins, such as fish, chicken without skin, beans, eggs, or tofu. ? Avoid pre-made and processed foods. These tend to be higher in sodium, added sugar, and fat.  Reduce your daily sodium intake. Most people with hypertension should eat less than 1,500 mg of sodium a day.  Do not drink alcohol if: ? Your health care provider tells you not to drink. ? You are pregnant, may be pregnant, or are planning to become pregnant.  If you drink alcohol: ? Limit how much you use to:  0-1 drink a day for women.  0-2 drinks a day for men. ? Be aware of how much alcohol is in your drink. In the [place] one drink equals one 12 oz bottle of beer (355 mL), one 5 oz glass of wine (148 mL), or one 1 oz glass of hard liquor (44 mL). Lifestyle   Work with your health care provider to maintain a healthy body weight or to lose weight. Ask what an ideal weight is for you.  Get at least 30 minutes of exercise most days of the week. Activities may include walking, swimming, or biking.  Include exercise to strengthen your muscles (resistance exercise),  such as Pilates or lifting weights, as part of your weekly exercise routine. Try to do these types of exercises for 30 minutes at least 3 days a week.  Do not use any products that contain nicotine or tobacco, such as cigarettes, e-cigarettes, and chewing tobacco. If you need help quitting, ask your health care provider.  Monitor your blood pressure at home as told by your health care provider.  Keep all follow-up visits as told by your health care provider. This is important. Medicines  Take over-the-counter and prescription medicines only as told by your health care provider. Follow directions carefully. Blood pressure medicines must be taken as prescribed.  Do not skip doses of blood pressure medicine. Doing this puts you at risk for problems and can make the medicine less effective.  Ask your health care provider about side effects  or reactions to medicines that you should watch for. Contact a health care provider if you:  Think you are having a reaction to a medicine you are taking.  Have headaches that keep coming back (recurring).  Feel dizzy.  Have swelling in your ankles.  Have trouble with your vision. Get help right away if you:  Develop a severe headache or confusion.  Have unusual weakness or numbness.  Feel faint.  Have severe pain in your chest or abdomen.  Vomit repeatedly.  Have trouble breathing. Summary  Hypertension is when the force of blood pumping through your arteries is too strong. If this condition is not controlled, it may put you at risk for serious complications.  Your personal target blood pressure may vary depending on your medical conditions, your age, and other factors. For most people, a normal blood pressure is less than 120/80.  Hypertension is treated with lifestyle changes, medicines, or a combination of both. Lifestyle changes include losing weight, eating a healthy, low-sodium diet, exercising more, and limiting alcohol. This  information is not intended to replace advice given to you by your health care provider. Make sure you discuss any questions you have with your health care provider. Document Revised: 09/22/2017 Document Reviewed: 09/22/2017 Elsevier Patient Education  2020 [employer].

## 2019-06-07 NOTE — Telephone Encounter (Signed)
Medications sent to pharmacy

## 2019-06-18 ENCOUNTER — Other Ambulatory Visit: Payer: Self-pay | Admitting: Physician Assistant

## 2019-06-27 ENCOUNTER — Other Ambulatory Visit: Payer: Self-pay

## 2019-06-27 ENCOUNTER — Telehealth: Payer: Medicare HMO

## 2019-07-05 ENCOUNTER — Telehealth: Payer: Medicare HMO

## 2019-07-05 ENCOUNTER — Other Ambulatory Visit: Payer: Self-pay

## 2019-07-14 ENCOUNTER — Ambulatory Visit: Payer: Medicare HMO

## 2019-07-14 NOTE — Progress Notes (Signed)
error 

## 2019-08-10 ENCOUNTER — Ambulatory Visit: Payer: Medicare HMO

## 2019-08-10 ENCOUNTER — Other Ambulatory Visit: Payer: Self-pay

## 2019-08-10 DIAGNOSIS — E1169 Type 2 diabetes mellitus with other specified complication: Secondary | ICD-10-CM

## 2019-08-10 DIAGNOSIS — E1165 Type 2 diabetes mellitus with hyperglycemia: Secondary | ICD-10-CM

## 2019-08-10 DIAGNOSIS — E785 Hyperlipidemia, unspecified: Secondary | ICD-10-CM

## 2019-08-10 DIAGNOSIS — Z720 Tobacco use: Secondary | ICD-10-CM

## 2019-08-10 NOTE — Patient Instructions (Signed)
Please call me at [phone] (direct line) with any questions - thank you!  - Edyth Gunnels., Clinical Pharmacist  Madelin Rear, Pharm.D., BCGP Clinical Pharmacist [facility] [phone]   Diabetes Mellitus and Nutrition, Adult When you have diabetes (diabetes mellitus), it is very important to have healthy eating habits because your blood sugar (glucose) levels are greatly affected by what you eat and drink. Eating healthy foods in the appropriate amounts, at about the same times every day, can help you:  Control your blood glucose.  Lower your risk of heart disease.  Improve your blood pressure.  Reach or maintain a healthy weight. Every person with diabetes is different, and each person has different needs for a meal plan. Your health care provider may recommend that you work with a diet and nutrition specialist (dietitian) to make a meal plan that is best for you. Your meal plan may vary depending on factors such as:  The calories you need.  The medicines you take.  Your weight.  Your blood glucose, blood pressure, and cholesterol levels.  Your activity level.  Other health conditions you have, such as heart or kidney disease. How do carbohydrates affect me? Carbohydrates, also called carbs, affect your blood glucose level more than any other type of food. Eating carbs naturally raises the amount of glucose in your blood. Carb counting is a method for keeping track of how many carbs you eat. Counting carbs is important to keep your blood glucose at a healthy level, especially if you use insulin or take certain oral diabetes medicines. It is important to know how many carbs you can safely have in each meal. This is different for every person. Your dietitian can help you calculate how many carbs you should have at each meal and for each snack. Foods that contain carbs include:  Bread, cereal, rice, pasta, and crackers.  Potatoes and  corn.  Peas, beans, and lentils.  Milk and yogurt.  Fruit and juice.  Desserts, such as cakes, cookies, ice cream, and candy. How does alcohol affect me? Alcohol can cause a sudden decrease in blood glucose (hypoglycemia), especially if you use insulin or take certain oral diabetes medicines. Hypoglycemia can be a life-threatening condition. Symptoms of hypoglycemia (sleepiness, dizziness, and confusion) are similar to symptoms of having too much alcohol. If your health care provider says that alcohol is safe for you, follow these guidelines:  Limit alcohol intake to no more than 1 drink per day for nonpregnant women and 2 drinks per day for men. One drink equals 12 oz of beer, 5 oz of wine, or 1 oz of hard liquor.  Do not drink on an empty stomach.  Keep yourself hydrated with water, diet soda, or unsweetened iced tea.  Keep in mind that regular soda, juice, and other mixers may contain a lot of sugar and must be counted as carbs. What are tips for following this plan?  Reading food labels  Start by checking the serving size on the "Nutrition Facts" label of packaged foods and drinks. The amount of calories, carbs, fats, and other nutrients listed on the label is based on one serving of the item. Many items contain more than one serving per package.  Check the total grams (g) of carbs in one serving. You can calculate the number of servings of carbs in one serving by dividing the total carbs by 15. For example, if a food has 30 g of total carbs, it would be equal  to 2 servings of carbs.  Check the number of grams (g) of saturated and trans fats in one serving. Choose foods that have low or no amount of these fats.  Check the number of milligrams (mg) of salt (sodium) in one serving. Most people should limit total sodium intake to less than 2,300 mg per day.  Always check the nutrition information of foods labeled as "low-fat" or "nonfat". These foods may be higher in added sugar or  refined carbs and should be avoided.  Talk to your dietitian to identify your daily goals for nutrients listed on the label. Shopping  Avoid buying canned, premade, or processed foods. These foods tend to be high in fat, sodium, and added sugar.  Shop around the outside edge of the grocery store. This includes fresh fruits and vegetables, bulk grains, fresh meats, and fresh dairy. Cooking  Use low-heat cooking methods, such as baking, instead of high-heat cooking methods like deep frying.  Cook using healthy oils, such as olive, canola, or sunflower oil.  Avoid cooking with butter, cream, or high-fat meats. Meal planning  Eat meals and snacks regularly, preferably at the same times every day. Avoid going long periods of time without eating.  Eat foods high in fiber, such as fresh fruits, vegetables, beans, and whole grains. Talk to your dietitian about how many servings of carbs you can eat at each meal.  Eat 4-6 ounces (oz) of lean protein each day, such as lean meat, chicken, fish, eggs, or tofu. One oz of lean protein is equal to: ? 1 oz of meat, chicken, or fish. ? 1 egg. ?  cup of tofu.  Eat some foods each day that contain healthy fats, such as avocado, nuts, seeds, and fish. Lifestyle  Check your blood glucose regularly.  Exercise regularly as told by your health care provider. This may include: ? 150 minutes of moderate-intensity or vigorous-intensity exercise each week. This could be brisk walking, biking, or water aerobics. ? Stretching and doing strength exercises, such as yoga or weightlifting, at least 2 times a week.  Take medicines as told by your health care provider.  Do not use any products that contain nicotine or tobacco, such as cigarettes and e-cigarettes. If you need help quitting, ask your health care provider.  Work with a Social worker or diabetes educator to identify strategies to manage stress and any emotional and social challenges. Questions to ask a  health care provider  Do I need to meet with a diabetes educator?  Do I need to meet with a dietitian?  What number can I call if I have questions?  When are the best times to check my blood glucose? Where to find more information:  American Diabetes Association: diabetes.org  Academy of Nutrition and Dietetics: www.eatright.[employer] of Diabetes and Digestive and Kidney Diseases (NIH): [employer] Summary  A healthy meal plan will help you control your blood glucose and maintain a healthy lifestyle.  Working with a diet and nutrition specialist (dietitian) can help you make a meal plan that is best for you.  Keep in mind that carbohydrates (carbs) and alcohol have immediate effects on your blood glucose levels. It is important to count carbs and to use alcohol carefully. This information is not intended to replace advice given to you by your health care provider. Make sure you discuss any questions you have with your health care provider. Document Revised: 12/25/2016 Document Reviewed: 02/17/2016 Elsevier Patient Education  2020 [employer].

## 2019-08-10 NOTE — Progress Notes (Signed)
[facility]  Name: John Woodward.  MRN: [id number] DOB: February 16, 1945  Chief Complaint/ HPI  John Heman.,  74 y.o. , male presents for their Follow-Up CCM visit with the clinical pharmacist via telephone due to COVID-19 Pandemic. Caregiver is wife, [place], who handles patient's medications.   PCP : Brunetta Jeans, PA-C  Their chronic conditions include: HLD, HTN, DM type 2.    Office Visits: 06/07/2019 [facility]): initial CCM.  10/30/2018 (PCP): improved HTN after increasing hctz to 25 mg daily, on DASH diet, continue nicoderm.   Past Medical History:  Diagnosis Date  . Arthritis   . Closed fracture dislocation of right elbow    30 years ago -- fell out of theback of a truck  . Diabetes mellitus without complication ([place])   . Hx of colonic polyps 02/23/2017  . Hyperlipidemia   . Hypertension    Diagnosed 10 years ago. No on medication. Denies history of CAD.    Social History   Socioeconomic History  . Marital status: Married    Spouse name: Not on file  . Number of children: 3  . Years of education: Not on file  . Highest education level: 12th grade  Occupational History  . Occupation: Retired    Comment: Dealer  Tobacco Use  . Smoking status: Current Every Day Smoker    Packs/day: 0.50    Years: 50.00    Pack years: 25.00    Types: Cigarettes    Start date: 08/12/1966  . Smokeless tobacco: Never Used  Vaping Use  . Vaping Use: Never used  Substance and Sexual Activity  . Alcohol use: No  . Drug use: No  . Sexual activity: Yes  Other Topics Concern  . Not on file  Social History Narrative   Social Review      Patient lives at home with. Spouse Rilee Wendling   Pt lives in a one or two story home? One story home   Does patient lives in a facility? If so where? Private home   What is patient highest level of education? 12 grade   Pt is RIGHT handed   Social Determinants of Health   Financial Resource Strain:   .  Difficulty of Paying Living Expenses:   Food Insecurity: No Food Insecurity  . Worried About Charity fundraiser in the Last Year: Never true  . Ran Out of Food in the Last Year: Never true  Transportation Needs: No Transportation Needs  . Lack of Transportation (Medical): No  . Lack of Transportation (Non-Medical): No  Physical Activity:   . Days of Exercise per Week:   . Minutes of Exercise per Session:   Stress:   . Feeling of Stress :   Social Connections:   . Frequency of Communication with Friends and Family:   . Frequency of Social Gatherings with Friends and Family:   . Attends Religious Services:   . Active Member of Clubs or Organizations:   . Attends Archivist Meetings:   Marland Kitchen Marital Status:    Allergies  Allergen Reactions  . Antibacterial Hand Soap [Triclosan]     Water blisters   Current Meds  Medication Sig  . acetaminophen (TYLENOL) 500 MG tablet Take 500-1,000 mg by mouth every 6 (six) hours as needed (for pain.).  Marland Kitchen aspirin 81 MG chewable tablet Chew 81 mg by mouth daily.  . hydrochlorothiazide (HYDRODIURIL) 25 MG tablet Take 1 tablet (25 mg total) by mouth daily.  Needs follow-up for further refills.  Marland Kitchen losartan (COZAAR) 100 MG tablet Take 1 tablet (100 mg total) by mouth daily.  . metFORMIN (GLUCOPHAGE) 500 MG tablet TAKE 1 TABLET BY MOUTH DAILY IN THE MORNING AND 2 TABLETS DAILY IN THE EVENING  . nicotine polacrilex (COMMIT) 4 MG lozenge Take 4 mg by mouth as needed for smoking cessation. 6 lozenges/day.   . potassium chloride SA (KLOR-CON) 20 MEQ tablet Take 1 tablet (20 mEq total) by mouth daily.   Hypertension   BP Readings from Last 3 Encounters:  11/09/18 128/76  10/19/18 140/90  09/22/18 (!) 146/88   BMP Latest Ref Rng & Units 10/19/2018 09/15/2018 07/14/2018  Glucose 70 - 99 mg/dL 113(H) 107(H) 116(H)  BUN 6 - 23 mg/dL 22 18 26(H)  Creatinine 0.40 - 1.50 mg/dL 1.17 1.10 1.22  BUN/Creat Ratio 6 - 22 (calc) - - -  Sodium 135 - 145 mEq/L  137 137 141  Potassium 3.5 - 5.1 mEq/L 4.4 4.4 4.7  Chloride 96 - 112 mEq/L 98 100 105  CO2 19 - 32 mEq/L 31 25 30   Calcium 8.4 - 10.5 mg/dL 9.7 9.5 9.3   Patient checks BP at home infrequently. Reports daughter will take BP on occasion. Patient home BP readings are ranging: n/a. Due for f/u with PCP.  Patient is currently taking the following medications:   HCTZ 25 mg daily   Losartan 100 mg daily   Potassium chloride ER 20 meq daily  We discussed: home BP monitoring, agrees to f/u with PCP.  Plan F/u visit with PCP next week Continue current medications and control with diet and exercise.   Diabetes   Recent Relevant Labs:  Lab Results  Component Value Date   HGBA1C 6.4 07/14/2018   HGBA1C 6.6 (H) 02/04/2018   HGBA1C 6.8 (H) 08/05/2017   HGBA1C 7.2 (H) 12/11/2016   HGBA1C 11.9 (H) 09/11/2016   MICROALBUR 44.2 (H) 09/18/2016   A1c has ranged 6.4-11.9 since (08/2016) with most recent 6.4 (07/14/2018). No Hx of previous DM medications.  Currently follows low carb diet. Checking BG: infrequently.  Patient is currently controlled on the following medications:   Metformin 500 mg tablet - one tab every morning, two tabs every night  Denies any side effects including diarrhea at this time.  Plan  Continue current medications and control with diet and exercise.    F/u with PCP next week, DM labs  Hyperlipidemia   Lipid Panel     Component Value Date/Time   CHOL 201 (H) 07/14/2018 0856   TRIG 168.0 (H) 07/14/2018 0856   HDL 35.60 (L) 07/14/2018 0856   CHOLHDL 6 07/14/2018 0856   VLDL 33.6 07/14/2018 0856   LDLCALC 132 (H) 07/14/2018 0856   LDLDIRECT 111.0 10/30/2016 0941    The 10-year ASCVD risk score (Goff DC Jr., et al., 2013) is: 52.2%   Values used to calculate the score:     Age: 41 years     Sex: Male     Is Non-Hispanic African American: No     Diabetic: Yes     Tobacco smoker: Yes     Systolic Blood Pressure: 433 mmHg     Is BP treated: Yes      HDL Cholesterol: 35.6 mg/dL     Total Cholesterol: 201 mg/dL   Patient has failed these meds in past: pitastatin 2 mg, pravastatin 20/40 mg.   Per 06/2018 lipid panel, LDL uncontrolled. Several cardiac risk factors, including DM. ASCVD 52.2%.  Caregiver  previously expressed concern over memory issues/side effects related to statins. Could consider rosuvastatin- most hydrophilic statin and least likely to cross BBB.   Taking aspirin 81 mg for CVD prevention. Denies any abnormal bruising, bleeding from nose or gums or blood in urine or stool.  Plan  Continue current medications  F/u in office for PCP 1 week, lipid panel. Consider rosuvastatin 10 mg daily initial dose.  Tobacco Abuse   Tobacco Use  Smoking Status Current Every Day Smoker  . Packs/day: 0.50  . Years: 50.00  . Pack years: 25.00  . Types: Cigarettes  . Start date: 08/12/1966  Smokeless Tobacco Never Used   Time to first cigarette less than 30 minutes from waking. Denies any other triggers throughout the day. Smoking 0.5 packs or less per day. Currently taking:  Nicotine lozenge 4 mg - six lozenges/day   We discussed: first cigarette triggers. Counseled to allow lozenge to dissolve and absorb in cheek pocket, rather than swallow, to reduce GI side effects.  Plan  Continue current medications.  Vaccines   Immunization History  Administered Date(s) Administered  . Fluad Quad(high Dose 65+) 10/19/2018  . Influenza, High Dose Seasonal PF 10/30/2016, 02/04/2018  . PFIZER SARS-COV-2 Vaccination 03/26/2019, 04/26/2019  . Pneumococcal Conjugate-13 09/10/2016  . Pneumococcal Polysaccharide-23 02/04/2018   Due for Shingrix. Reviewed recommendation.  Plan  Recommended patient receive Shingles vaccine in pharmacy.   Medication Management / Care Coordination   Receives prescription medications from:  [facility] - [place], [place] - [address] [place] [address] Phone: [phone] Fax: [phone]  [facility] [address] [place] - [address] [place] [place] [address] Phone: [phone] Fax: [phone]   Reviewed patient's [facility] medication and Epic medication profile assuring there are no discrepancies or gaps in therapy. Confirmed all fill dates appropriate and verified with patient that there is a sufficient quantity of all prescribed medications at home. Informed patient to call me any time if needing medications before scheduled deliveries. The anticipated medication sync date is 08/25/2019.  Past due for OV with PCP - scheduling coordinated, appt scheduled.   Plan  Continue current medication management strategy.  Follow up: 3 month phone visit.  Smoking cessation, cholesterol, DM, medication management.  ___________________________  Madelin Rear, Pharm.D., BCGP Clinical Pharmacist [facility] [phone]

## 2019-08-17 ENCOUNTER — Ambulatory Visit: Payer: Medicare HMO | Admitting: Physician Assistant

## 2019-08-21 ENCOUNTER — Telehealth: Payer: Self-pay

## 2019-08-21 ENCOUNTER — Other Ambulatory Visit: Payer: Self-pay | Admitting: Emergency Medicine

## 2019-08-21 DIAGNOSIS — E1165 Type 2 diabetes mellitus with hyperglycemia: Secondary | ICD-10-CM

## 2019-08-21 DIAGNOSIS — I10 Essential (primary) hypertension: Secondary | ICD-10-CM

## 2019-08-21 MED ORDER — HYDROCHLOROTHIAZIDE 25 MG PO TABS: 25.0000 mg | ORAL_TABLET | Freq: Every day | ORAL | 1 refills | Status: DC

## 2019-08-21 MED ORDER — LOSARTAN POTASSIUM 100 MG PO TABS: 100.0000 mg | ORAL_TABLET | Freq: Every day | ORAL | 1 refills | Status: DC

## 2019-08-21 MED ORDER — POTASSIUM CHLORIDE CRYS ER 20 MEQ PO TBCR: 20.0000 meq | EXTENDED_RELEASE_TABLET | Freq: Every day | ORAL | 1 refills | Status: DC

## 2019-08-21 MED ORDER — METFORMIN HCL 500 MG PO TABS: ORAL_TABLET | ORAL | 1 refills | Status: DC

## 2019-08-21 NOTE — Telephone Encounter (Cosign Needed)
HI Patina - would you be able to [facility] for Mr. Avakian please - Hydrochlorothiazide 25 mg once daily with breakfast - Potassium 20 meq once daily with breakfast - Losartan 100 mg once daily with breakfast - Metformin 500 mg tablet - one tablet daily with breakfast and two tablets every night with evening meal  Thank you Edison Nasuti

## 2019-08-21 NOTE — Telephone Encounter (Signed)
Requested prescriptions sent to [facility]

## 2019-08-24 ENCOUNTER — Encounter: Payer: Self-pay | Admitting: Physician Assistant

## 2019-08-24 ENCOUNTER — Other Ambulatory Visit: Payer: Self-pay

## 2019-08-24 ENCOUNTER — Ambulatory Visit (INDEPENDENT_AMBULATORY_CARE_PROVIDER_SITE_OTHER): Payer: Medicare HMO | Admitting: Physician Assistant

## 2019-08-24 VITALS — BP 150/80 | HR 78 | Temp 98.2°F | Resp 16 | Ht 71.0 in | Wt 161.0 lb

## 2019-08-24 DIAGNOSIS — Z125 Encounter for screening for malignant neoplasm of prostate: Secondary | ICD-10-CM | POA: Diagnosis not present

## 2019-08-24 DIAGNOSIS — E785 Hyperlipidemia, unspecified: Secondary | ICD-10-CM

## 2019-08-24 DIAGNOSIS — Z0000 Encounter for general adult medical examination without abnormal findings: Secondary | ICD-10-CM

## 2019-08-24 DIAGNOSIS — E119 Type 2 diabetes mellitus without complications: Secondary | ICD-10-CM

## 2019-08-24 DIAGNOSIS — E1169 Type 2 diabetes mellitus with other specified complication: Secondary | ICD-10-CM | POA: Diagnosis not present

## 2019-08-24 DIAGNOSIS — I10 Essential (primary) hypertension: Secondary | ICD-10-CM

## 2019-08-24 LAB — COMPREHENSIVE METABOLIC PANEL
ALT: 10 U/L (ref 0–53)
AST: 13 U/L (ref 0–37)
Albumin: 4.1 g/dL (ref 3.5–5.2)
Alkaline Phosphatase: 66 U/L (ref 39–117)
BUN: 33 mg/dL — ABNORMAL HIGH (ref 6–23)
CO2: 30 mEq/L (ref 19–32)
Calcium: 9.3 mg/dL (ref 8.4–10.5)
Chloride: 102 mEq/L (ref 96–112)
Creatinine, Ser: 1.44 mg/dL (ref 0.40–1.50)
GFR: 48 mL/min — ABNORMAL LOW (ref >60.00)
Glucose, Bld: 118 mg/dL — ABNORMAL HIGH (ref 70–99)
Potassium: 4.1 mEq/L (ref 3.5–5.1)
Sodium: 138 mEq/L (ref 135–145)
Total Bilirubin: 0.4 mg/dL (ref 0.2–1.2)
Total Protein: 6.8 g/dL (ref 6.0–8.3)

## 2019-08-24 LAB — CBC WITH DIFFERENTIAL/PLATELET
Basophils Absolute: 0.1 10*3/uL (ref 0.0–0.1)
Basophils Relative: 0.9 % (ref 0.0–3.0)
Eosinophils Absolute: 0.2 10*3/uL (ref 0.0–0.7)
Eosinophils Relative: 4 % (ref 0.0–5.0)
HCT: 46.4 % (ref 39.0–52.0)
Hemoglobin: 16 g/dL (ref 13.0–17.0)
Lymphocytes Relative: 24 % (ref 12.0–46.0)
Lymphs Abs: 1.3 10*3/uL (ref 0.7–4.0)
MCHC: 34.5 g/dL (ref 30.0–36.0)
MCV: 94.8 fl (ref 78.0–100.0)
Monocytes Absolute: 0.4 10*3/uL (ref 0.1–1.0)
Monocytes Relative: 8 % (ref 3.0–12.0)
Neutro Abs: 3.5 10*3/uL (ref 1.4–7.7)
Neutrophils Relative %: 63.1 % (ref 43.0–77.0)
Platelets: 237 10*3/uL (ref 150.0–400.0)
RBC: 4.89 Mil/uL (ref 4.22–5.81)
RDW: 13.8 % (ref 11.5–15.5)
WBC: 5.6 10*3/uL (ref 4.0–10.5)

## 2019-08-24 LAB — LDL CHOLESTEROL, DIRECT: Direct LDL: 139 mg/dL

## 2019-08-24 LAB — LIPID PANEL
Cholesterol: 216 mg/dL — ABNORMAL HIGH (ref 0–200)
HDL: 37.7 mg/dL — ABNORMAL LOW (ref >39.00)
NonHDL: 178.67
Total CHOL/HDL Ratio: 6
Triglycerides: 212 mg/dL — ABNORMAL HIGH (ref 0.0–149.0)
VLDL: 42.4 mg/dL — ABNORMAL HIGH (ref 0.0–40.0)

## 2019-08-24 LAB — PSA, MEDICARE: PSA: 0.2 ng/ml (ref 0.10–4.00)

## 2019-08-24 LAB — HEMOGLOBIN A1C: Hgb A1c MFr Bld: 6.3 % (ref 4.6–6.5)

## 2019-08-24 NOTE — Patient Instructions (Signed)
Please go to the lab for blood work.   Our office will call you with your results unless you have chosen to receive results via MyChart.  If your blood work is normal we will follow-up each year for physicals and as scheduled for chronic medical problems.  If anything is abnormal we will treat accordingly and get you in for a follow-up.  Please continue current medication regimen.  I really recommend we start a medication like crestor to help with chol and reduce inflammation in the blood vessels which increases risk of stroke and heart attack. As discussed, we could even start at just 1-2 x weekly. We will revisit this when we call with lab results.    Preventive Care 74 Years and Older, Male Preventive care refers to lifestyle choices and visits with your health care provider that can promote health and wellness. This includes:  A yearly physical exam. This is also called an annual well check.  Regular dental and eye exams.  Immunizations.  Screening for certain conditions.  Healthy lifestyle choices, such as diet and exercise. What can I expect for my preventive care visit? Physical exam Your health care provider will check:  Height and weight. These may be used to calculate body mass index (BMI), which is a measurement that tells if you are at a healthy weight.  Heart rate and blood pressure.  Your skin for abnormal spots. Counseling Your health care provider may ask you questions about:  Alcohol, tobacco, and drug use.  Emotional well-being.  Home and relationship well-being.  Sexual activity.  Eating habits.  History of falls.  Memory and ability to understand (cognition).  Work and work Statistician. What immunizations do I need?  Influenza (flu) vaccine  This is recommended every year. Tetanus, diphtheria, and pertussis (Tdap) vaccine  You may need a Td booster every 10 years. Varicella (chickenpox) vaccine  You may need this vaccine if you have not  already been vaccinated. Zoster (shingles) vaccine  You may need this after age 74. Pneumococcal conjugate (PCV13) vaccine  One dose is recommended after age 35. Pneumococcal polysaccharide (PPSV23) vaccine  One dose is recommended after age 28. Measles, mumps, and rubella (MMR) vaccine  You may need at least one dose of MMR if you were born in 1957 or later. You may also need a second dose. Meningococcal conjugate (MenACWY) vaccine  You may need this if you have certain conditions. Hepatitis A vaccine  You may need this if you have certain conditions or if you travel or work in places where you may be exposed to hepatitis A. Hepatitis B vaccine  You may need this if you have certain conditions or if you travel or work in places where you may be exposed to hepatitis B. Haemophilus influenzae type b (Hib) vaccine  You may need this if you have certain conditions. You may receive vaccines as individual doses or as more than one vaccine together in one shot (combination vaccines). Talk with your health care provider about the risks and benefits of combination vaccines. What tests do I need? Blood tests  Lipid and cholesterol levels. These may be checked every 5 years, or more frequently depending on your overall health.  Hepatitis C test.  Hepatitis B test. Screening  Lung cancer screening. You may have this screening every year starting at age 74 if you have a 30-pack-year history of smoking and currently smoke or have quit within the past 15 years.  Colorectal cancer screening. All adults should  have this screening starting at age 74 and continuing until age 41. Your health care provider may recommend screening at age 74 if you are at increased risk. You will have tests every 1-10 years, depending on your results and the type of screening test.  Prostate cancer screening. Recommendations will vary depending on your family history and other risks.  Diabetes screening. This is  done by checking your blood sugar (glucose) after you have not eaten for a while (fasting). You may have this done every 1-3 years.  Abdominal aortic aneurysm (AAA) screening. You may need this if you are a current or former smoker.  Sexually transmitted disease (STD) testing. Follow these instructions at home: Eating and drinking  Eat a diet that includes fresh fruits and vegetables, whole grains, lean protein, and low-fat dairy products. Limit your intake of foods with high amounts of sugar, saturated fats, and salt.  Take vitamin and mineral supplements as recommended by your health care provider.  Do not drink alcohol if your health care provider tells you not to drink.  If you drink alcohol: ? Limit how much you have to 0-2 drinks a day. ? Be aware of how much alcohol is in your drink. In the [place] one drink equals one 12 oz bottle of beer (355 mL), one 5 oz glass of wine (148 mL), or one 1 oz glass of hard liquor (44 mL). Lifestyle  Take daily care of your teeth and gums.  Stay active. Exercise for at least 30 minutes on 5 or more days each week.  Do not use any products that contain nicotine or tobacco, such as cigarettes, e-cigarettes, and chewing tobacco. If you need help quitting, ask your health care provider.  If you are sexually active, practice safe sex. Use a condom or other form of protection to prevent STIs (sexually transmitted infections).  Talk with your health care provider about taking a low-dose aspirin or statin. What's next?  Visit your health care provider once a year for a well check visit.  Ask your health care provider how often you should have your eyes and teeth checked.  Stay up to date on all vaccines. This information is not intended to replace advice given to you by your health care provider. Make sure you discuss any questions you have with your health care provider. Document Revised: 01/06/2018 Document Reviewed: 01/06/2018 Elsevier Patient  Education  [employer]. .

## 2019-08-24 NOTE — Progress Notes (Signed)
Patient presents to clinic today for annual exam.  Patient is fasting for labs.  Chronic Issues: Diabetes Mellitus II -- Last A1C at 6.4 with current regimen of Metformin. No known history of diabetic retinopathy, neuropathy or nephropathy. Patient endorses taking his Metformin daily as directed. Is working hard on watching his diet. Wife notes he has been doing a better job of staying well-hydrated. Patient does not check sugars at home. Patient is due for foot examination. Denies any concerns today. Is up-to-date on immunizations.  Hypertension associated with DM --patient is currently on a regimen of losartan 100 mg daily and hydrochlorothiazide 25 mg daily. Endorses taking both medications as directed and tolerating well. Patient denies chest pain, palpitations, lightheadedness, dizziness, vision changes or frequent headaches. Has not taken his medication yet this morning.  BP Readings from Last 3 Encounters:  08/24/19 (!) 150/80  11/09/18 128/76  10/19/18 140/90   Hyperlipidemia associated with DM --patient previously on trial of a couple of statins including Pravachol and Livalo. Had a hard time tolerating the Pravachol. Patient and wife discontinue the Livalo as they were concerned about the potential effect of statin on memory. Recently had a chronic care management visit with our pharmacist at which time a trial of Crestor was recommended due to it being hydrophilic and least likely to cross the blood-brain barrier and cause issues with memory or mentation. Patient does continue his 81 mg aspirin daily.  Health Maintenance: Immunizations -- UTD on Pneumonia series, COVID vaccines. Medicare does not cover Tetanus/TDaP as prophylactic vaccination. Colonoscopy -- History of colon polyps. UTD on surveillance.  Past Medical History:  Diagnosis Date  . Arthritis   . Closed fracture dislocation of right elbow    30 years ago -- fell out of theback of a truck  . Diabetes mellitus  without complication ([place])   . Hx of colonic polyps 02/23/2017  . Hyperlipidemia   . Hypertension    Diagnosed 10 years ago. No on medication. Denies history of CAD.     Past Surgical History:  Procedure Laterality Date  . OLECRANON BURSECTOMY Right 08/18/2016   Procedure: OLECRANON BURSECTOMY;  Surgeon: Meredith Pel, MD;  Location: [facility];  Service: Orthopedics;  Laterality: Right;    Current Outpatient Medications on File Prior to Visit  Medication Sig Dispense Refill  . acetaminophen (TYLENOL) 500 MG tablet Take 500-1,000 mg by mouth every 6 (six) hours as needed (for pain.).    Marland Kitchen aspirin 81 MG chewable tablet Chew 81 mg by mouth daily.    . hydrochlorothiazide (HYDRODIURIL) 25 MG tablet Take 1 tablet (25 mg total) by mouth daily. 90 tablet 1  . losartan (COZAAR) 100 MG tablet Take 1 tablet (100 mg total) by mouth daily. 90 tablet 1  . Menthol 5 MG LOZG Use as directed 1 lozenge in the mouth or throat daily.    . metFORMIN (GLUCOPHAGE) 500 MG tablet TAKE 1 TABLET BY MOUTH DAILY IN THE MORNING AND 2 TABLETS DAILY IN THE EVENING 270 tablet 1  . potassium chloride SA (KLOR-CON) 20 MEQ tablet Take 1 tablet (20 mEq total) by mouth daily. 90 tablet 1   No current facility-administered medications on file prior to visit.    Allergies  Allergen Reactions  . Antibacterial Hand Soap [Triclosan]     Water blisters    Family History  Problem Relation Age of Onset  . Hypertension Mother   . Dementia Mother        99s  .  Transient ischemic attack Mother        42s  . Kidney disease Father   . COPD Father   . Hypertension Brother   . Alcohol abuse Maternal Grandfather   . Mental illness Paternal Grandmother   . Hypertension Daughter   . Healthy Son   . Learning disabilities Son   . Lung cancer Maternal Aunt   . Colon cancer Neg Hx   . Colon polyps Neg Hx   . Esophageal cancer Neg Hx   . Rectal cancer Neg Hx   . Stomach cancer Neg Hx     Social History   Socioeconomic  History  . Marital status: Married    Spouse name: Not on file  . Number of children: 3  . Years of education: Not on file  . Highest education level: 12th grade  Occupational History  . Occupation: Retired    Comment: Dealer  Tobacco Use  . Smoking status: Current Every Day Smoker    Packs/day: 0.25    Years: 50.00    Pack years: 12.50    Types: Cigarettes    Start date: 08/12/1966  . Smokeless tobacco: Never Used  . Tobacco comment: Taking Nicotine lozenges  Vaping Use  . Vaping Use: Never used  Substance and Sexual Activity  . Alcohol use: No  . Drug use: No  . Sexual activity: Yes  Other Topics Concern  . Not on file  Social History Narrative   Social Review      Patient lives at home with. Spouse Sotirios Navarro   Pt lives in a one or two story home? One story home   Does patient lives in a facility? If so where? Private home   What is patient highest level of education? 12 grade   Pt is RIGHT handed   Social Determinants of Health   Financial Resource Strain:   . Difficulty of Paying Living Expenses:   Food Insecurity: No Food Insecurity  . Worried About Charity fundraiser in the Last Year: Never true  . Ran Out of Food in the Last Year: Never true  Transportation Needs: No Transportation Needs  . Lack of Transportation (Medical): No  . Lack of Transportation (Non-Medical): No  Physical Activity:   . Days of Exercise per Week:   . Minutes of Exercise per Session:   Stress:   . Feeling of Stress :   Social Connections:   . Frequency of Communication with Friends and Family:   . Frequency of Social Gatherings with Friends and Family:   . Attends Religious Services:   . Active Member of Clubs or Organizations:   . Attends Archivist Meetings:   Marland Kitchen Marital Status:   Intimate Partner Violence:   . Fear of Current or Ex-Partner:   . Emotionally Abused:   Marland Kitchen Physically Abused:   . Sexually Abused:    Review of Systems  Constitutional: Negative  for fever and weight loss.  HENT: Negative for ear discharge, ear pain, hearing loss and tinnitus.   Eyes: Negative for blurred vision, double vision, photophobia and pain.  Respiratory: Negative for cough and shortness of breath.   Cardiovascular: Negative for chest pain and palpitations.  Gastrointestinal: Negative for abdominal pain, blood in stool, constipation, diarrhea, heartburn, melena, nausea and vomiting.  Genitourinary: Negative for dysuria, flank pain, frequency, hematuria and urgency.  Musculoskeletal: Negative for falls.  Neurological: Negative for dizziness, loss of consciousness and headaches.  Endo/Heme/Allergies: Negative for environmental allergies.  Psychiatric/Behavioral: Negative  for depression, hallucinations, substance abuse and suicidal ideas. The patient is not nervous/anxious and does not have insomnia.    BP (!) 150/80   Pulse 78   Temp 98.2 F (36.8 C) (Temporal)   Resp 16   Ht 5\' 11"  (1.803 m)   Wt 161 lb (73 kg)   SpO2 96%   BMI 22.45 kg/m   Physical Exam Vitals reviewed.  Constitutional:      General: He is not in acute distress.    Appearance: He is well-developed. He is not diaphoretic.  HENT:     Head: Normocephalic and atraumatic.     Right Ear: Tympanic membrane, ear canal and external ear normal.     Left Ear: Tympanic membrane, ear canal and external ear normal.     Nose: Nose normal.     Mouth/Throat:     Pharynx: No posterior oropharyngeal erythema.  Eyes:     Conjunctiva/sclera: Conjunctivae normal.     Pupils: Pupils are equal, round, and reactive to light.  Neck:     Thyroid: No thyromegaly.  Cardiovascular:     Rate and Rhythm: Normal rate and regular rhythm.     Heart sounds: Normal heart sounds.  Pulmonary:     Effort: Pulmonary effort is normal. No respiratory distress.     Breath sounds: Normal breath sounds. No wheezing or rales.  Chest:     Chest wall: No tenderness.  Abdominal:     General: Bowel sounds are normal.  There is no distension.     Palpations: Abdomen is soft. There is no mass.     Tenderness: There is no abdominal tenderness. There is no guarding or rebound.  Musculoskeletal:     Cervical back: Neck supple.  Lymphadenopathy:     Cervical: No cervical adenopathy.  Skin:    General: Skin is warm and dry.     Findings: No rash.  Neurological:     Mental Status: He is alert and oriented to person, place, and time.     Cranial Nerves: No cranial nerve deficit.    Assessment/Plan: 1. Visit for preventive health examination Depression screen negative. Health Maintenance reviewed. Preventive schedule discussed and handout given in AVS. Will obtain fasting labs today.  - Comprehensive metabolic panel - CBC with Differential/Platelet - Hemoglobin A1c - Lipid panel  2. Type II diabetes mellitus, well controlled ([place]) Foot exam updated today with mild decrease sensation of right great toe. Otherwise sensation intact. Discussed proper foot care and recommend podiatry for this. They will give some thought. Repeat fasting labs today to include CMP, lipid panel and A1c. Will alter regimen according to results. Again patient is up-to-date on immunizations. Diabetic eye examination discussed with patient and wife. - Comprehensive metabolic panel - Hemoglobin A1c - Lipid panel  3. Essential hypertension Blood pressure above goal this morning the patient has not had his medication. Asymptomatic. Patient previously well controlled on this regimen. He is encouraged to take his medication once he is home and continue monitoring ambulatory blood pressure readings. If staying above 135/85 he is to let us know. Repeat labs today. - Comprehensive metabolic panel - Hemoglobin A1c - Lipid panel  4. Hyperlipidemia associated with type 2 diabetes mellitus ([place]) We will repeat fasting labs today. They are still on the fence about restarting statin. Again reviewed the necessity for statin given his age,  diabetes and comorbid conditions. - Comprehensive metabolic panel - Hemoglobin A1c - Lipid panel  5. Prostate cancer screening  He indicates  understanding of the limitations of this screening test and wishes to proceed with screening PSA testing.  - PSA, Medicare  This visit occurred during the SARS-CoV-2 public health emergency.  Safety protocols were in place, including screening questions prior to the visit, additional usage of staff PPE, and extensive cleaning of exam room while observing appropriate contact time as indicated for disinfecting solutions.     Leeanne Rio, PA-C

## 2019-08-25 ENCOUNTER — Other Ambulatory Visit: Payer: Self-pay | Admitting: Emergency Medicine

## 2019-08-25 DIAGNOSIS — E785 Hyperlipidemia, unspecified: Secondary | ICD-10-CM

## 2019-08-25 DIAGNOSIS — E1169 Type 2 diabetes mellitus with other specified complication: Secondary | ICD-10-CM

## 2019-08-25 MED ORDER — ROSUVASTATIN CALCIUM 10 MG PO TABS: ORAL_TABLET | ORAL | 3 refills | Status: DC

## 2019-09-21 DIAGNOSIS — Z7984 Long term (current) use of oral hypoglycemic drugs: Secondary | ICD-10-CM | POA: Diagnosis not present

## 2019-09-21 DIAGNOSIS — Z9109 Other allergy status, other than to drugs and biological substances: Secondary | ICD-10-CM | POA: Diagnosis not present

## 2019-09-21 DIAGNOSIS — I10 Essential (primary) hypertension: Secondary | ICD-10-CM | POA: Diagnosis not present

## 2019-09-21 DIAGNOSIS — E119 Type 2 diabetes mellitus without complications: Secondary | ICD-10-CM | POA: Diagnosis not present

## 2019-09-21 DIAGNOSIS — K08409 Partial loss of teeth, unspecified cause, unspecified class: Secondary | ICD-10-CM | POA: Diagnosis not present

## 2019-09-21 DIAGNOSIS — Z7982 Long term (current) use of aspirin: Secondary | ICD-10-CM | POA: Diagnosis not present

## 2019-09-21 DIAGNOSIS — R69 Illness, unspecified: Secondary | ICD-10-CM | POA: Diagnosis not present

## 2019-09-21 DIAGNOSIS — I951 Orthostatic hypotension: Secondary | ICD-10-CM | POA: Diagnosis not present

## 2019-11-08 ENCOUNTER — Telehealth: Payer: Self-pay

## 2019-11-08 NOTE — Progress Notes (Signed)
° ° °  Chronic Care Management Pharmacy Assistant   Name: Loyd Marhefka.  MRN: [id number] DOB: 1945/03/18  Reason for Encounter: Medication Review      PCP : Brunetta Jeans, PA-C  Allergies:   Allergies  Allergen Reactions   Antibacterial Hand Soap [Triclosan]     Water blisters    Medications: Outpatient Encounter Medications as of 11/08/2019  Medication Sig   acetaminophen (TYLENOL) 500 MG tablet Take 500-1,000 mg by mouth every 6 (six) hours as needed (for pain.).   aspirin 81 MG chewable tablet Chew 81 mg by mouth daily.   hydrochlorothiazide (HYDRODIURIL) 25 MG tablet Take 1 tablet (25 mg total) by mouth daily.   losartan (COZAAR) 100 MG tablet Take 1 tablet (100 mg total) by mouth daily.   Menthol 5 MG LOZG Use as directed 1 lozenge in the mouth or throat daily.   metFORMIN (GLUCOPHAGE) 500 MG tablet TAKE 1 TABLET BY MOUTH DAILY IN THE MORNING AND 2 TABLETS DAILY IN THE EVENING   potassium chloride SA (KLOR-CON) 20 MEQ tablet Take 1 tablet (20 mEq total) by mouth daily.   rosuvastatin (CRESTOR) 10 MG tablet Take 1 tablet on Mon, Wed, Fri   No facility-administered encounter medications on file as of 11/08/2019.    Current Diagnosis: Patient Active Problem List   Diagnosis Date Noted   Pulmonary emphysema ([place]) 09/22/2018   Encounter for screening for malignant neoplasm of respiratory organs 02/07/2018   Screening for AAA (abdominal aortic aneurysm) 02/07/2018   Skin cancer screening 02/07/2018   Need for 23-polyvalent pneumococcal polysaccharide vaccine 02/07/2018   Encounter for immunization 02/07/2018   Visit for preventive health examination 02/07/2018   Hx of colonic polyps 02/23/2017   Hyperlipidemia associated with type 2 diabetes mellitus ([place]) 10/02/2016   Diabetes mellitus type II, uncontrolled ([place]) 09/18/2016   Encounter for Medicare annual wellness exam 09/16/2016   Special screening examination for viral disease 09/16/2016     Annual physical exam 09/16/2016   Prostate cancer screening 09/16/2016   Tobacco abuse disorder 08/11/2016   Essential hypertension 08/11/2016   Reviewed chart for medication changes ahead of medication coordination call.  No OVs, Consults, or hospital visits since last care coordination call/Pharmacist visit. (If appropriate, list visit date, provider name)  No medication changes indicated OR if recent visit, treatment plan here.  BP Readings from Last 3 Encounters:  08/24/19 (!) 150/80  11/09/18 128/76  10/19/18 140/90    Lab Results  Component Value Date   HGBA1C 6.3 08/24/2019     Patient obtains medications through Adherence Pack[facility]  90 Days   Patient is due for next adherence delivery on: 11-17-19. Called patient and reviewed medications and coordinated delivery.  This delivery to include: Hydrochlorothiazide 25 mg tab one tab every morning Losartan 100 mg tab one tab every morning Metformin 500 mg tab one tab in morning and take two tablets in evening Potassium Chloride Er 48mq tab one tab every morning   Patient declined the following medication Rosuvastatin 10mg  - Patients wife stated he does not take this medication.    Confirmed delivery date of 11-17-19, advised patient that pharmacy will contact them the morning of delivery.   Georgiana Shore ,[facility] Pharmacist Assistant [phone]  Follow-Up:  Pharmacist Review

## 2019-11-08 NOTE — Progress Notes (Signed)
Left a voice message for patient to return call in regards to medication delivery.   Georgiana Shore ,[facility] Pharmacist Assistant [phone]

## 2019-11-15 ENCOUNTER — Telehealth: Payer: Medicare HMO

## 2019-11-17 ENCOUNTER — Ambulatory Visit: Payer: Medicare HMO

## 2019-11-17 NOTE — Chronic Care Management (AMB) (Signed)
  Chronic Care Management   Outreach Note   Name: John Woodward. MRN: [id number] DOB: September 19, 1945  Referred by: Brunetta Jeans, PA-C Reason for referral: Telephone Appointment with [facility] Pharmacist, Madelin Rear.   An unsuccessful telephone outreach was attempted today. The patient was referred to the pharmacist for assistance with care management and care coordination.   Telephone appointment with clinical pharmacist today (11/17/2019) at 1pm. If patient immediately returns call, transfer to [phone]. Otherwise, please provide this number so patient can reschedule visit.   Madelin Rear, Pharm.D., BCGP Clinical Pharmacist [facility] [phone]

## 2019-12-03 ENCOUNTER — Inpatient Hospital Stay (HOSPITAL_COMMUNITY)
Admission: EM | Admit: 2019-12-03 | Discharge: 2019-12-05 | DRG: 640 | Disposition: A | Discharge status: Home / Self Care | Payer: Medicare HMO | Attending: Internal Medicine | Admitting: Internal Medicine

## 2019-12-03 ENCOUNTER — Emergency Department (HOSPITAL_COMMUNITY): Payer: Medicare HMO

## 2019-12-03 ENCOUNTER — Other Ambulatory Visit: Payer: Self-pay

## 2019-12-03 ENCOUNTER — Encounter (HOSPITAL_COMMUNITY): Payer: Self-pay | Admitting: Internal Medicine

## 2019-12-03 DIAGNOSIS — R0602 Shortness of breath: Secondary | ICD-10-CM

## 2019-12-03 DIAGNOSIS — E785 Hyperlipidemia, unspecified: Secondary | ICD-10-CM | POA: Diagnosis present

## 2019-12-03 DIAGNOSIS — Z23 Encounter for immunization: Secondary | ICD-10-CM

## 2019-12-03 DIAGNOSIS — U071 COVID-19: Secondary | ICD-10-CM | POA: Diagnosis not present

## 2019-12-03 DIAGNOSIS — Z7982 Long term (current) use of aspirin: Secondary | ICD-10-CM

## 2019-12-03 DIAGNOSIS — R55 Syncope and collapse: Secondary | ICD-10-CM | POA: Diagnosis not present

## 2019-12-03 DIAGNOSIS — I10 Essential (primary) hypertension: Secondary | ICD-10-CM | POA: Diagnosis present

## 2019-12-03 DIAGNOSIS — I509 Heart failure, unspecified: Secondary | ICD-10-CM

## 2019-12-03 DIAGNOSIS — I491 Atrial premature depolarization: Secondary | ICD-10-CM | POA: Diagnosis not present

## 2019-12-03 DIAGNOSIS — Z7984 Long term (current) use of oral hypoglycemic drugs: Secondary | ICD-10-CM

## 2019-12-03 DIAGNOSIS — E119 Type 2 diabetes mellitus without complications: Secondary | ICD-10-CM | POA: Diagnosis present

## 2019-12-03 DIAGNOSIS — Z8673 Personal history of transient ischemic attack (TIA), and cerebral infarction without residual deficits: Secondary | ICD-10-CM

## 2019-12-03 DIAGNOSIS — F17210 Nicotine dependence, cigarettes, uncomplicated: Secondary | ICD-10-CM | POA: Diagnosis present

## 2019-12-03 DIAGNOSIS — Z801 Family history of malignant neoplasm of trachea, bronchus and lung: Secondary | ICD-10-CM

## 2019-12-03 DIAGNOSIS — I4891 Unspecified atrial fibrillation: Secondary | ICD-10-CM | POA: Diagnosis present

## 2019-12-03 DIAGNOSIS — E860 Dehydration: Principal | ICD-10-CM | POA: Diagnosis present

## 2019-12-03 DIAGNOSIS — R0600 Dyspnea, unspecified: Secondary | ICD-10-CM | POA: Diagnosis not present

## 2019-12-03 DIAGNOSIS — I248 Other forms of acute ischemic heart disease: Secondary | ICD-10-CM | POA: Diagnosis present

## 2019-12-03 DIAGNOSIS — I110 Hypertensive heart disease with heart failure: Secondary | ICD-10-CM | POA: Diagnosis not present

## 2019-12-03 DIAGNOSIS — R4020 Unspecified coma: Secondary | ICD-10-CM | POA: Diagnosis not present

## 2019-12-03 DIAGNOSIS — I471 Supraventricular tachycardia: Secondary | ICD-10-CM | POA: Diagnosis not present

## 2019-12-03 DIAGNOSIS — I480 Paroxysmal atrial fibrillation: Secondary | ICD-10-CM | POA: Diagnosis not present

## 2019-12-03 DIAGNOSIS — N170 Acute kidney failure with tubular necrosis: Secondary | ICD-10-CM | POA: Diagnosis present

## 2019-12-03 DIAGNOSIS — Z8249 Family history of ischemic heart disease and other diseases of the circulatory system: Secondary | ICD-10-CM

## 2019-12-03 DIAGNOSIS — R0902 Hypoxemia: Secondary | ICD-10-CM | POA: Diagnosis not present

## 2019-12-03 DIAGNOSIS — Z825 Family history of asthma and other chronic lower respiratory diseases: Secondary | ICD-10-CM

## 2019-12-03 DIAGNOSIS — Z79899 Other long term (current) drug therapy: Secondary | ICD-10-CM

## 2019-12-03 DIAGNOSIS — Z841 Family history of disorders of kidney and ureter: Secondary | ICD-10-CM

## 2019-12-03 DIAGNOSIS — Z86010 Personal history of colonic polyps: Secondary | ICD-10-CM

## 2019-12-03 LAB — CBG MONITORING, ED: Glucose-Capillary: 123 mg/dL — ABNORMAL HIGH (ref 70–99)

## 2019-12-03 LAB — URINALYSIS, ROUTINE W REFLEX MICROSCOPIC
Bacteria, UA: NONE SEEN
Bilirubin Urine: NEGATIVE
Glucose, UA: NEGATIVE mg/dL
Hgb urine dipstick: NEGATIVE
Ketones, ur: NEGATIVE mg/dL
Leukocytes,Ua: NEGATIVE
Nitrite: NEGATIVE
Protein, ur: 100 mg/dL — AB
Specific Gravity, Urine: 1.015 (ref 1.005–1.030)
pH: 6 (ref 5.0–8.0)

## 2019-12-03 LAB — CBC WITH DIFFERENTIAL/PLATELET
Abs Immature Granulocytes: 0.01 10*3/uL (ref 0.00–0.07)
Basophils Absolute: 0 10*3/uL (ref 0.0–0.1)
Basophils Relative: 0 %
Eosinophils Absolute: 0 10*3/uL (ref 0.0–0.5)
Eosinophils Relative: 0 %
HCT: 48.2 % (ref 39.0–52.0)
Hemoglobin: 16.1 g/dL (ref 13.0–17.0)
Immature Granulocytes: 0 %
Lymphocytes Relative: 22 %
Lymphs Abs: 1 10*3/uL (ref 0.7–4.0)
MCH: 32.4 pg (ref 26.0–34.0)
MCHC: 33.4 g/dL (ref 30.0–36.0)
MCV: 97 fL (ref 80.0–100.0)
Monocytes Absolute: 0.4 10*3/uL (ref 0.1–1.0)
Monocytes Relative: 8 %
Neutro Abs: 3.2 10*3/uL (ref 1.7–7.7)
Neutrophils Relative %: 70 %
Platelets: 142 10*3/uL — ABNORMAL LOW (ref 150–400)
RBC: 4.97 MIL/uL (ref 4.22–5.81)
RDW: 13.2 % (ref 11.5–15.5)
WBC: 4.6 10*3/uL (ref 4.0–10.5)
nRBC: 0 % (ref 0.0–0.2)

## 2019-12-03 LAB — COMPREHENSIVE METABOLIC PANEL
ALT: 17 U/L (ref 0–44)
AST: 25 U/L (ref 15–41)
Albumin: 3.2 g/dL — ABNORMAL LOW (ref 3.5–5.0)
Alkaline Phosphatase: 54 U/L (ref 38–126)
Anion gap: 10 (ref 5–15)
BUN: 32 mg/dL — ABNORMAL HIGH (ref 8–23)
CO2: 24 mmol/L (ref 22–32)
Calcium: 8.3 mg/dL — ABNORMAL LOW (ref 8.9–10.3)
Chloride: 104 mmol/L (ref 98–111)
Creatinine, Ser: 1.83 mg/dL — ABNORMAL HIGH (ref 0.61–1.24)
GFR, Estimated: 38 mL/min — ABNORMAL LOW (ref >60)
Glucose, Bld: 149 mg/dL — ABNORMAL HIGH (ref 70–99)
Potassium: 4.4 mmol/L (ref 3.5–5.1)
Sodium: 138 mmol/L (ref 135–145)
Total Bilirubin: 1 mg/dL (ref 0.3–1.2)
Total Protein: 6.2 g/dL — ABNORMAL LOW (ref 6.5–8.1)

## 2019-12-03 LAB — MAGNESIUM: Magnesium: 1.9 mg/dL (ref 1.7–2.4)

## 2019-12-03 LAB — HEMOGLOBIN A1C
Hgb A1c MFr Bld: 6.2 % — ABNORMAL HIGH (ref 4.8–5.6)
Mean Plasma Glucose: 131.24 mg/dL

## 2019-12-03 LAB — TROPONIN I (HIGH SENSITIVITY): Troponin I (High Sensitivity): 71 ng/L — ABNORMAL HIGH (ref <18)

## 2019-12-03 LAB — T4, FREE: Free T4: 1.03 ng/dL (ref 0.61–1.12)

## 2019-12-03 LAB — D-DIMER, QUANTITATIVE: D-Dimer, Quant: 1.48 ug/mL-FEU — ABNORMAL HIGH (ref 0.00–0.50)

## 2019-12-03 LAB — RESPIRATORY PANEL BY RT PCR (FLU A&B, COVID)
Influenza A by PCR: NEGATIVE
Influenza B by PCR: NEGATIVE
SARS Coronavirus 2 by RT PCR: POSITIVE — AB

## 2019-12-03 LAB — LACTATE DEHYDROGENASE: LDH: 267 U/L — ABNORMAL HIGH (ref 98–192)

## 2019-12-03 LAB — FIBRINOGEN: Fibrinogen: 478 mg/dL — ABNORMAL HIGH (ref 210–475)

## 2019-12-03 LAB — TSH: TSH: 2.605 u[IU]/mL (ref 0.350–4.500)

## 2019-12-03 LAB — URIC ACID: Uric Acid, Serum: 7.5 mg/dL (ref 3.7–8.6)

## 2019-12-03 LAB — PROCALCITONIN: Procalcitonin: 0.22 ng/mL

## 2019-12-03 LAB — GLUCOSE, CAPILLARY: Glucose-Capillary: 103 mg/dL — ABNORMAL HIGH (ref 70–99)

## 2019-12-03 LAB — SODIUM, URINE, RANDOM: Sodium, Ur: 100 mmol/L

## 2019-12-03 LAB — BRAIN NATRIURETIC PEPTIDE: B Natriuretic Peptide: 117 pg/mL — ABNORMAL HIGH (ref 0.0–100.0)

## 2019-12-03 LAB — C-REACTIVE PROTEIN: CRP: 1.7 mg/dL — ABNORMAL HIGH (ref <1.0)

## 2019-12-03 LAB — CREATININE, URINE, RANDOM: Creatinine, Urine: 130.52 mg/dL

## 2019-12-03 LAB — FERRITIN: Ferritin: 270 ng/mL (ref 24–336)

## 2019-12-03 MED ORDER — ALBUTEROL SULFATE HFA 108 (90 BASE) MCG/ACT IN AERS
2.0000 | INHALATION_SPRAY | Freq: Once | RESPIRATORY_TRACT | Status: DC | PRN
  Filled 2019-12-03: qty 6.7

## 2019-12-03 MED ORDER — SODIUM CHLORIDE 0.9 % IV SOLN: INTRAVENOUS | Status: DC | PRN

## 2019-12-03 MED ORDER — IPRATROPIUM BROMIDE HFA 17 MCG/ACT IN AERS: 2.0000 | INHALATION_SPRAY | Freq: Four times a day (QID) | RESPIRATORY_TRACT | Status: DC

## 2019-12-03 MED ORDER — SODIUM CHLORIDE 0.9 % IV SOLN
Freq: Once | INTRAVENOUS | Status: AC
  Filled 2019-12-03: qty 20

## 2019-12-03 MED ORDER — DIPHENHYDRAMINE HCL 50 MG/ML IJ SOLN: 50.0000 mg | Freq: Once | INTRAMUSCULAR | Status: DC | PRN

## 2019-12-03 MED ORDER — INSULIN ASPART 100 UNIT/ML ~~LOC~~ SOLN
0.0000 [IU] | Freq: Three times a day (TID) | SUBCUTANEOUS | Status: DC
  Administered 2019-12-04 (×2): 1 [IU] via SUBCUTANEOUS
  Administered 2019-12-04: 2 [IU] via SUBCUTANEOUS
  Administered 2019-12-05: 1 [IU] via SUBCUTANEOUS

## 2019-12-03 MED ORDER — METOPROLOL TARTRATE 12.5 MG HALF TABLET
12.5000 mg | ORAL_TABLET | Freq: Two times a day (BID) | ORAL | Status: DC
  Administered 2019-12-03: 12.5 mg via ORAL
  Filled 2019-12-03: qty 1

## 2019-12-03 MED ORDER — HEPARIN BOLUS VIA INFUSION
4500.0000 [IU] | Freq: Once | INTRAVENOUS | Status: AC
  Administered 2019-12-03: 4500 [IU] via INTRAVENOUS
  Filled 2019-12-03: qty 4500

## 2019-12-03 MED ORDER — ACETAMINOPHEN 325 MG PO TABS: 650.0000 mg | ORAL_TABLET | ORAL | Status: DC | PRN

## 2019-12-03 MED ORDER — DILTIAZEM HCL-DEXTROSE 125-5 MG/125ML-% IV SOLN (PREMIX)
5.0000 mg/h | INTRAVENOUS | Status: DC
  Administered 2019-12-03: 5 mg/h via INTRAVENOUS
  Administered 2019-12-04: 12.5 mg/h via INTRAVENOUS
  Filled 2019-12-03 (×2): qty 125

## 2019-12-03 MED ORDER — LOSARTAN POTASSIUM 50 MG PO TABS: 25.0000 mg | ORAL_TABLET | Freq: Every day | ORAL | Status: DC

## 2019-12-03 MED ORDER — GUAIFENESIN-DM 100-10 MG/5ML PO SYRP: 10.0000 mL | ORAL_SOLUTION | ORAL | Status: DC | PRN

## 2019-12-03 MED ORDER — SODIUM CHLORIDE 0.9 % IV BOLUS
1000.0000 mL | Freq: Once | INTRAVENOUS | Status: AC
  Administered 2019-12-03: 1000 mL via INTRAVENOUS

## 2019-12-03 MED ORDER — HEPARIN (PORCINE) 25000 UT/250ML-% IV SOLN
1100.0000 [IU]/h | INTRAVENOUS | Status: DC
  Administered 2019-12-03: 1100 [IU]/h via INTRAVENOUS
  Filled 2019-12-03: qty 250

## 2019-12-03 MED ORDER — METFORMIN HCL 500 MG PO TABS
500.0000 mg | ORAL_TABLET | ORAL | Status: DC
Start: 2019-12-03 — End: 2019-12-03

## 2019-12-03 MED ORDER — ROSUVASTATIN CALCIUM 5 MG PO TABS
10.0000 mg | ORAL_TABLET | ORAL | Status: DC
  Administered 2019-12-04: 10 mg via ORAL
  Filled 2019-12-03: qty 2

## 2019-12-03 MED ORDER — DILTIAZEM LOAD VIA INFUSION
20.0000 mg | Freq: Once | INTRAVENOUS | Status: AC
  Administered 2019-12-03: 20 mg via INTRAVENOUS
  Filled 2019-12-03: qty 20

## 2019-12-03 MED ORDER — EPINEPHRINE 0.3 MG/0.3ML IJ SOAJ
0.3000 mg | Freq: Once | INTRAMUSCULAR | Status: DC | PRN
  Filled 2019-12-03: qty 0.6

## 2019-12-03 MED ORDER — SODIUM CHLORIDE 0.9 % IV SOLN: INTRAVENOUS | Status: DC

## 2019-12-03 MED ORDER — ONDANSETRON HCL 4 MG/2ML IJ SOLN: 4.0000 mg | Freq: Four times a day (QID) | INTRAMUSCULAR | Status: DC | PRN

## 2019-12-03 MED ORDER — FAMOTIDINE IN NACL 20-0.9 MG/50ML-% IV SOLN
20.0000 mg | Freq: Once | INTRAVENOUS | Status: DC | PRN
  Filled 2019-12-03: qty 50

## 2019-12-03 MED ORDER — ACETAMINOPHEN 500 MG PO TABS
500.0000 mg | ORAL_TABLET | Freq: Four times a day (QID) | ORAL | Status: DC | PRN
Start: 2019-12-03 — End: 2019-12-03

## 2019-12-03 MED ORDER — METOPROLOL TARTRATE 5 MG/5ML IV SOLN
2.5000 mg | Freq: Once | INTRAVENOUS | Status: AC
  Administered 2019-12-03: 2.5 mg via INTRAVENOUS
  Filled 2019-12-03: qty 5

## 2019-12-03 MED ORDER — LABETALOL HCL 100 MG PO TABS: 100.0000 mg | ORAL_TABLET | Freq: Three times a day (TID) | ORAL | Status: DC | PRN

## 2019-12-03 MED ORDER — METHYLPREDNISOLONE SODIUM SUCC 125 MG IJ SOLR: 125.0000 mg | Freq: Once | INTRAMUSCULAR | Status: DC | PRN

## 2019-12-03 MED ORDER — ASPIRIN EC 81 MG PO TBEC
162.0000 mg | DELAYED_RELEASE_TABLET | Freq: Every day | ORAL | Status: DC
  Administered 2019-12-03: 162 mg via ORAL
  Filled 2019-12-03: qty 2

## 2019-12-03 NOTE — ED Provider Notes (Signed)
[facility] EMERGENCY DEPARTMENT Provider Note   CSN: [id number] Arrival date & time: 12/03/19  1214     History Chief Complaint  Patient presents with  . Near Syncope    John Woodward. is a 74 y.o. male.  74 yo M with a cc of a syncopal event.  No noticeable prodrome was sipping coffee and woke up with EMS.  He denies any overt chest pain or trouble breathing but said he is just felt under the weather for the past 3 to 4 days.  Thinks it feels like a cold but without a sore throat.  Very mild cough.  No appreciable palpitations.  Lower extremity edema chronically and has not changed.  Denied headache or neck pain.  Denies any fevers nausea vomiting.  He has diarrhea but has a chronically thinks it secondary to one of his diabetes medicines that he takes.  Not any worse or more than normal.  Not dark not bloody.  The history is provided by the patient.  Near Syncope This is a new problem. The current episode started yesterday. The problem occurs constantly. The problem has not changed since onset.Pertinent negatives include no chest pain, no abdominal pain, no headaches and no shortness of breath. Nothing aggravates the symptoms. Nothing relieves the symptoms. He has tried nothing for the symptoms. The treatment provided no relief.       Past Medical History:  Diagnosis Date  . Arthritis   . Closed fracture dislocation of right elbow    30 years ago -- fell out of theback of a truck  . Diabetes mellitus without complication ([place])   . Hx of colonic polyps 02/23/2017  . Hyperlipidemia   . Hypertension    Diagnosed 10 years ago. No on medication. Denies history of CAD.     Patient Active Problem List   Diagnosis Date Noted  . A-fib ([place]) 12/03/2019  . Pulmonary emphysema ([place]) 09/22/2018  . Encounter for screening for malignant neoplasm of respiratory organs 02/07/2018  . Screening for AAA (abdominal aortic aneurysm) 02/07/2018  . Skin cancer screening  02/07/2018  . Need for 23-polyvalent pneumococcal polysaccharide vaccine 02/07/2018  . Encounter for immunization 02/07/2018  . Visit for preventive health examination 02/07/2018  . Hx of colonic polyps 02/23/2017  . Hyperlipidemia associated with type 2 diabetes mellitus ([place]) 10/02/2016  . Diabetes mellitus type II, uncontrolled ([place]) 09/18/2016  . Encounter for Medicare annual wellness exam 09/16/2016  . Special screening examination for viral disease 09/16/2016  . Annual physical exam 09/16/2016  . Prostate cancer screening 09/16/2016  . Tobacco abuse disorder 08/11/2016  . Essential hypertension 08/11/2016    Past Surgical History:  Procedure Laterality Date  . OLECRANON BURSECTOMY Right 08/18/2016   Procedure: OLECRANON BURSECTOMY;  Surgeon: Meredith Pel, MD;  Location: [facility];  Service: Orthopedics;  Laterality: Right;       Family History  Problem Relation Age of Onset  . Hypertension Mother   . Dementia Mother        57s  . Transient ischemic attack Mother        62s  . Kidney disease Father   . COPD Father   . Hypertension Brother   . Alcohol abuse Maternal Grandfather   . Mental illness Paternal Grandmother   . Hypertension Daughter   . Healthy Son   . Learning disabilities Son   . Lung cancer Maternal Aunt   . Colon cancer Neg Hx   . Colon polyps Neg  Hx   . Esophageal cancer Neg Hx   . Rectal cancer Neg Hx   . Stomach cancer Neg Hx     Social History   Tobacco Use  . Smoking status: Current Every Day Smoker    Packs/day: 0.25    Years: 50.00    Pack years: 12.50    Types: Cigarettes    Start date: 08/12/1966  . Smokeless tobacco: Never Used  . Tobacco comment: Taking Nicotine lozenges  Vaping Use  . Vaping Use: Never used  Substance Use Topics  . Alcohol use: No  . Drug use: No    Home Medications Prior to Admission medications   Medication Sig Start Date End Date Taking? Authorizing Provider  acetaminophen (TYLENOL) 500 MG tablet  Take 500-1,000 mg by mouth every 6 (six) hours as needed for mild pain (or headaches).    Yes [provider]  aspirin (BAYER LOW DOSE) 81 MG EC tablet Take 162 mg by mouth at bedtime. Swallow whole.   Yes [provider]  hydrochlorothiazide (HYDRODIURIL) 25 MG tablet Take 1 tablet (25 mg total) by mouth daily. 08/21/19  Yes Brunetta Jeans, PA-C  ibuprofen (ADVIL) 200 MG tablet Take 400 mg by mouth every 6 (six) hours as needed for mild pain (or headaches).   Yes [provider]  losartan (COZAAR) 100 MG tablet Take 1 tablet (100 mg total) by mouth daily. 08/21/19  Yes Brunetta Jeans, PA-C  metFORMIN (GLUCOPHAGE) 500 MG tablet TAKE 1 TABLET BY MOUTH DAILY IN THE MORNING AND 2 TABLETS DAILY IN THE EVENING Patient taking differently: Take 500-1,000 mg by mouth See admin instructions. Take 500 mg by mouth in the morning and 1,000 mg at bedtime 08/21/19  Yes Brunetta Jeans, PA-C  nicotine polacrilex (COMMIT) 4 MG lozenge Take 4 mg by mouth as needed for smoking cessation.   Yes [provider]  potassium chloride SA (KLOR-CON) 20 MEQ tablet Take 1 tablet (20 mEq total) by mouth daily. 08/21/19  Yes Brunetta Jeans, PA-C  rosuvastatin (CRESTOR) 10 MG tablet Take 1 tablet on Mon, Wed, Fri Patient taking differently: Take 10 mg by mouth every Monday, Wednesday, and Friday. Take 1 tablet on Mon, Wed, Fri 08/25/19  Yes Brunetta Jeans, PA-C    Allergies    Antibacterial hand soap [triclosan]  Review of Systems   Review of Systems  Constitutional: Positive for fatigue. Negative for chills and fever.  HENT: Negative for congestion and facial swelling.   Eyes: Negative for discharge and visual disturbance.  Respiratory: Positive for cough. Negative for shortness of breath.   Cardiovascular: Positive for near-syncope. Negative for chest pain and palpitations.  Gastrointestinal: Positive for diarrhea. Negative for abdominal pain and vomiting.  Musculoskeletal:  Negative for arthralgias and myalgias.  Skin: Negative for color change and rash.  Neurological: Positive for syncope. Negative for tremors and headaches.  Psychiatric/Behavioral: Negative for confusion and dysphoric mood.    Physical Exam Updated Vital Signs BP (!) 107/92   Pulse 81   Temp 97.7 F (36.5 C) (Oral)   Resp (!) 21   Ht 5\' 11"  (1.803 m)   Wt 74.8 kg   SpO2 100%   BMI 23.01 kg/m   Physical Exam Vitals and nursing note reviewed.  Constitutional:      Appearance: He is well-developed.  HENT:     Head: Normocephalic and atraumatic.  Eyes:     Pupils: Pupils are equal, round, and reactive to light.  Neck:  Vascular: No JVD.  Cardiovascular:     Rate and Rhythm: Normal rate. Rhythm irregular.     Heart sounds: No murmur heard.  No friction rub. No gallop.   Pulmonary:     Effort: No respiratory distress.     Breath sounds: No wheezing.  Abdominal:     General: There is no distension.     Tenderness: There is no abdominal tenderness. There is no guarding or rebound.  Musculoskeletal:        General: Normal range of motion.     Cervical back: Normal range of motion and neck supple.  Skin:    Coloration: Skin is not pale.     Findings: No rash.  Neurological:     Mental Status: He is alert and oriented to person, place, and time.  Psychiatric:        Behavior: Behavior normal.     ED Results / Procedures / Treatments   Labs (all labs ordered are listed, but only abnormal results are displayed) Labs Reviewed  RESPIRATORY PANEL BY RT PCR (FLU A&B, COVID) - Abnormal; Notable for the following components:      Result Value   SARS Coronavirus 2 by RT PCR POSITIVE (*)    All other components within normal limits  CBC WITH DIFFERENTIAL/PLATELET - Abnormal; Notable for the following components:   Platelets 142 (*)    All other components within normal limits  COMPREHENSIVE METABOLIC PANEL - Abnormal; Notable for the following components:   Glucose, Bld  149 (*)    BUN 32 (*)    Creatinine, Ser 1.83 (*)    Calcium 8.3 (*)    Total Protein 6.2 (*)    Albumin 3.2 (*)    GFR, Estimated 38 (*)    All other components within normal limits  URINALYSIS, ROUTINE W REFLEX MICROSCOPIC - Abnormal; Notable for the following components:   Protein, ur 100 (*)    All other components within normal limits  HEMOGLOBIN A1C - Abnormal; Notable for the following components:   Hgb A1c MFr Bld 6.2 (*)    All other components within normal limits  CBG MONITORING, ED - Abnormal; Notable for the following components:   Glucose-Capillary 123 (*)    All other components within normal limits  TROPONIN I (HIGH SENSITIVITY) - Abnormal; Notable for the following components:   Troponin I (High Sensitivity) 71 (*)    All other components within normal limits  MAGNESIUM  T4, FREE  TSH  HEPARIN LEVEL (UNFRACTIONATED)  SODIUM, URINE, RANDOM  CREATININE, URINE, RANDOM  URIC ACID  BRAIN NATRIURETIC PEPTIDE  C-REACTIVE PROTEIN  D-DIMER, QUANTITATIVE (NOT AT [facility])  FERRITIN  FIBRINOGEN  LACTATE DEHYDROGENASE  PROCALCITONIN  HEPARIN LEVEL (UNFRACTIONATED)  CBC  LIPID PANEL  CBC WITH DIFFERENTIAL/PLATELET  COMPREHENSIVE METABOLIC PANEL  C-REACTIVE PROTEIN  D-DIMER, QUANTITATIVE (NOT AT [facility])  FERRITIN  MAGNESIUM  PHOSPHORUS  ABO/RH  TYPE AND SCREEN    EKG EKG Interpretation  Date/Time:  Sunday December 03 2019 12:21:43 EST Ventricular Rate:  107 PR Interval:    QRS Duration: 85 QT Interval:  379 QTC Calculation: 391 R Axis:   60 Text Interpretation: nsr with frequent pvc Low voltage, precordial leads Borderline ST depression, diffuse leads Otherwise no significant change Confirmed by Deno Etienne [phone]) on 12/03/2019 12:28:28 PM   Radiology DG Chest Port 1 View  Result Date: 12/03/2019 CLINICAL DATA:  Dyspnea EXAM: PORTABLE CHEST 1 VIEW COMPARISON:  None. FINDINGS: Normal heart size. Mildly tortuous atherosclerotic thoracic  aorta. Otherwise  normal mediastinal contour. No pneumothorax. No pleural effusion. Lungs appear clear, with no acute consolidative airspace disease and no pulmonary edema. IMPRESSION: No active disease. Electronically Signed   By: Ilona Sorrel M.D.   On: 12/03/2019 13:45    Procedures Procedures (including critical care time)  Medications Ordered in ED Medications  diltiazem (CARDIZEM) 1 mg/mL load via infusion 20 mg (20 mg Intravenous Bolus from Bag 12/03/19 1432)    And  diltiazem (CARDIZEM) 125 mg in dextrose 5% 125 mL (1 mg/mL) infusion (5 mg/hr Intravenous New Bag/Given 12/03/19 1431)  heparin ADULT infusion 100 units/mL (25000 units/255mL sodium chloride 0.45%) (1,100 Units/hr Intravenous New Bag/Given 12/03/19 1501)  aspirin EC tablet 162 mg (has no administration in time range)  metoprolol tartrate (LOPRESSOR) tablet 12.5 mg (has no administration in time range)  rosuvastatin (CRESTOR) tablet 10 mg (has no administration in time range)  acetaminophen (TYLENOL) tablet 650 mg (has no administration in time range)  ondansetron (ZOFRAN) injection 4 mg (has no administration in time range)  labetalol (NORMODYNE) tablet 100 mg (has no administration in time range)  0.9 %  sodium chloride infusion (has no administration in time range)  bamlanivimab 700 mg, etesevimab 1,400 mg in sodium chloride 0.9 % 160 mL IVPB (has no administration in time range)  0.9 %  sodium chloride infusion (has no administration in time range)  diphenhydrAMINE (BENADRYL) injection 50 mg (has no administration in time range)  famotidine (PEPCID) IVPB 20 mg premix (has no administration in time range)  albuterol (VENTOLIN HFA) 108 (90 Base) MCG/ACT inhaler 2 puff (has no administration in time range)  EPINEPHrine (EPI-PEN) injection 0.3 mg (has no administration in time range)  ipratropium (ATROVENT HFA) inhaler 2 puff (has no administration in time range)  guaiFENesin-dextromethorphan (ROBITUSSIN DM) 100-10 MG/5ML syrup 10 mL (has no  administration in time range)  insulin aspart (novoLOG) injection 0-9 Units (has no administration in time range)  sodium chloride 0.9 % bolus 1,000 mL (0 mLs Intravenous Stopped 12/03/19 1410)  metoprolol tartrate (LOPRESSOR) injection 2.5 mg (2.5 mg Intravenous Given 12/03/19 1250)  heparin bolus via infusion 4,500 Units (4,500 Units Intravenous Bolus from Bag 12/03/19 1502)    ED Course  I have reviewed the triage vital signs and the nursing notes.  Pertinent labs & imaging results that were available during my care of the patient were reviewed by me and considered in my medical decision making (see chart for details).    MDM Rules/Calculators/A&P                          74 yo M with a chief complaint of a syncopal event.  Patient has been feeling unwell for a few days and had an event today while he was standing sipping his coffee.  He had no prodrome.  EMS telemetry strips concerning for atrial fibrillation with RVR though currently he appears to be in a sinus rhythm with some ectopy.  Continues to be irregular now.  We will give a bolus of IV fluids lab work troponin chest x-ray reassess.  Patient with recurrent A. fib with RVR.  Will start on diltiazem drip.  Will admit.  CRITICAL CARE Performed by: Cecilio Asper   Total critical care time: 35 minutes  Critical care time was exclusive of separately billable procedures and treating other patients.  Critical care was necessary to treat or prevent imminent or life-threatening deterioration.  Critical care was time spent  personally by me on the following activities: development of treatment plan with patient and/or surrogate as well as nursing, discussions with consultants, evaluation of patient's response to treatment, examination of patient, obtaining history from patient or surrogate, ordering and performing treatments and interventions, ordering and review of laboratory studies, ordering and review of radiographic studies,  pulse oximetry and re-evaluation of patient's condition.  The patients results and plan were reviewed and discussed.   Any x-rays performed were independently reviewed by myself.   Differential diagnosis were considered with the presenting HPI.  Medications  diltiazem (CARDIZEM) 1 mg/mL load via infusion 20 mg (20 mg Intravenous Bolus from Bag 12/03/19 1432)    And  diltiazem (CARDIZEM) 125 mg in dextrose 5% 125 mL (1 mg/mL) infusion (5 mg/hr Intravenous New Bag/Given 12/03/19 1431)  heparin ADULT infusion 100 units/mL (25000 units/234mL sodium chloride 0.45%) (1,100 Units/hr Intravenous New Bag/Given 12/03/19 1501)  aspirin EC tablet 162 mg (has no administration in time range)  metoprolol tartrate (LOPRESSOR) tablet 12.5 mg (has no administration in time range)  rosuvastatin (CRESTOR) tablet 10 mg (has no administration in time range)  acetaminophen (TYLENOL) tablet 650 mg (has no administration in time range)  ondansetron (ZOFRAN) injection 4 mg (has no administration in time range)  labetalol (NORMODYNE) tablet 100 mg (has no administration in time range)  0.9 %  sodium chloride infusion (has no administration in time range)  bamlanivimab 700 mg, etesevimab 1,400 mg in sodium chloride 0.9 % 160 mL IVPB (has no administration in time range)  0.9 %  sodium chloride infusion (has no administration in time range)  diphenhydrAMINE (BENADRYL) injection 50 mg (has no administration in time range)  famotidine (PEPCID) IVPB 20 mg premix (has no administration in time range)  albuterol (VENTOLIN HFA) 108 (90 Base) MCG/ACT inhaler 2 puff (has no administration in time range)  EPINEPHrine (EPI-PEN) injection 0.3 mg (has no administration in time range)  ipratropium (ATROVENT HFA) inhaler 2 puff (has no administration in time range)  guaiFENesin-dextromethorphan (ROBITUSSIN DM) 100-10 MG/5ML syrup 10 mL (has no administration in time range)  insulin aspart (novoLOG) injection 0-9 Units (has no  administration in time range)  sodium chloride 0.9 % bolus 1,000 mL (0 mLs Intravenous Stopped 12/03/19 1410)  metoprolol tartrate (LOPRESSOR) injection 2.5 mg (2.5 mg Intravenous Given 12/03/19 1250)  heparin bolus via infusion 4,500 Units (4,500 Units Intravenous Bolus from Bag 12/03/19 1502)    Vitals:   12/03/19 1545 12/03/19 1630 12/03/19 1645 12/03/19 1715  BP: 115/87 122/82 120/75 (!) 107/92  Pulse: 73 84 81 81  Resp: (!) 24 18 17  (!) 21  Temp:      TempSrc:      SpO2: 97% 100% 99% 100%  Weight:      Height:        Final diagnoses:  Paroxysmal atrial fibrillation (HCC)  CHF (congestive heart failure) ([place])    Admission/ observation were discussed with the admitting physician, patient and/or family and they are comfortable with the plan.    Final Clinical Impression(s) / ED Diagnoses Final diagnoses:  Paroxysmal atrial fibrillation ([place])  CHF (congestive heart failure) ([place])    Rx / DC Orders ED Discharge Orders         Ordered    Amb referral to AFIB Clinic        12/03/19 [place], DO 12/03/19 1747

## 2019-12-03 NOTE — Progress Notes (Signed)
COVID turn positive.  Reevaluate patient, patient has few days of dry cough, no fever chills no shortness of breath on until after rapid A. fib started, briefly on oxygen but she has been stable after heart rate became controlled and x-ray shows no acute infiltrates.  No signs of, pneumonia as of now, Send and f/u Covid Labs and Give Mono Clonal Antibody x1.  Explained to Patient and Wife over the Phone.

## 2019-12-03 NOTE — ED Triage Notes (Signed)
Pt to ED via EMS from home c/o witnessed syncopal episode and palpations . Prior to syncopal episode pt was clammy and pale. Pt did not fall , wife caught him. On EMS arrival, pt NSR, once got on the EMS truck, [place]. Ems had pt vagal down HR 70s. No medications given by EMS. #[id number].  Pt currently denies chest pain, SHOB, palpitations. Last VS  v: 127/71, 99% RA pulse 76. cbg 166. - HX HTN.

## 2019-12-03 NOTE — ED Notes (Signed)
Date and time results received: 12/03/19  Test: covid  Critical Value: positive  Name of Provider Notified: Roosevelt Locks MD

## 2019-12-03 NOTE — Progress Notes (Signed)
RT instructed patient on the use of incentive spirometer and a flutter valve. Patient able to demonstrate back good technique with both devices. Patient able to reach 673mL using the incentive spirometer.

## 2019-12-03 NOTE — H&P (Addendum)
History and Physical    John Woodward. TKZ:[id number] DOB: 1945-03-09 DOA: 12/03/2019  PCP: Brunetta Jeans, PA-C (Confirm with patient/family/NH records and if not entered, this has to be entered at [facility] point of entry) Patient coming from: Home  I have personally briefly reviewed patient's old medical records in [place]  Chief Complaint: I passed out.  HPI: John Woodward. is a 74 y.o. male with medical history significant of HTN, HLD, IIDM, presented with syncope episode.  This happened this morning, patient was sitting next to the kitchen table suddenly fell forward on the table top and became unresponsive for about 2 to 3 minutes.  Wife at bedside witnessed and reported patient appeared cold clammy and pale, and breathing rapidly.  Patient woke up confused 5 minutes.  Wife also reported that last night patient was regurgitating several times, and this morning patient had one episode of loose bowel movement.  No fever chills chest pain with abdominal pain reported.  EMS arrived and found patient heart rate in 170s, EMS did 14 lethargic none heart rate came down to 70s.  Patient complained chest congestion but no chest pains.  Patient denied any history of heart attack A. fib or stroke.  However MRI 2020 showed chronic chronic lacunar strokes.  No history of GI bleed, however wife reported that patient been very easily get bruises on her arms and legs.  ED Course: Initially heart rate was 70s regular in the ED, however soon degenerated back to A. fib heart rate 150s, Cardizem drip was started and heparin drip started.  Review of Systems: As per HPI otherwise 10 point review of systems negative.    Past Medical History:  Diagnosis Date  . Arthritis   . Closed fracture dislocation of right elbow    30 years ago -- fell out of theback of a truck  . Diabetes mellitus without complication ([place])   . Hx of colonic polyps 02/23/2017  . Hyperlipidemia   . Hypertension     Diagnosed 10 years ago. No on medication. Denies history of CAD.     Past Surgical History:  Procedure Laterality Date  . OLECRANON BURSECTOMY Right 08/18/2016   Procedure: OLECRANON BURSECTOMY;  Surgeon: Meredith Pel, MD;  Location: [place];  Service: Orthopedics;  Laterality: Right;     reports that he has been smoking cigarettes. He started smoking about 53 years ago. He has a 12.50 pack-year smoking history. He has never used smokeless tobacco. He reports that he does not drink alcohol and does not use drugs.  Allergies  Allergen Reactions  . Antibacterial Hand Soap [Triclosan] Other (See Comments)    Caused water blisters    Family History  Problem Relation Age of Onset  . Hypertension Mother   . Dementia Mother        30s  . Transient ischemic attack Mother        40s  . Kidney disease Father   . COPD Father   . Hypertension Brother   . Alcohol abuse Maternal Grandfather   . Mental illness Paternal Grandmother   . Hypertension Daughter   . Healthy Son   . Learning disabilities Son   . Lung cancer Maternal Aunt   . Colon cancer Neg Hx   . Colon polyps Neg Hx   . Esophageal cancer Neg Hx   . Rectal cancer Neg Hx   . Stomach cancer Neg Hx      Prior to Admission medications  Medication Sig Start Date End Date Taking? Authorizing Provider  acetaminophen (TYLENOL) 500 MG tablet Take 500-1,000 mg by mouth every 6 (six) hours as needed for mild pain (or headaches).    Yes [provider]  aspirin (BAYER LOW DOSE) 81 MG EC tablet Take 162 mg by mouth at bedtime. Swallow whole.   Yes [provider]  hydrochlorothiazide (HYDRODIURIL) 25 MG tablet Take 1 tablet (25 mg total) by mouth daily. 08/21/19  Yes Brunetta Jeans, PA-C  ibuprofen (ADVIL) 200 MG tablet Take 400 mg by mouth every 6 (six) hours as needed for mild pain (or headaches).   Yes [provider]  losartan (COZAAR) 100 MG tablet Take 1 tablet (100 mg total) by mouth daily.  08/21/19  Yes Brunetta Jeans, PA-C  metFORMIN (GLUCOPHAGE) 500 MG tablet TAKE 1 TABLET BY MOUTH DAILY IN THE MORNING AND 2 TABLETS DAILY IN THE EVENING Patient taking differently: Take 500-1,000 mg by mouth See admin instructions. Take 500 mg by mouth in the morning and 1,000 mg at bedtime 08/21/19  Yes Brunetta Jeans, PA-C  nicotine polacrilex (COMMIT) 4 MG lozenge Take 4 mg by mouth as needed for smoking cessation.   Yes [provider]  potassium chloride SA (KLOR-CON) 20 MEQ tablet Take 1 tablet (20 mEq total) by mouth daily. 08/21/19  Yes Brunetta Jeans, PA-C  rosuvastatin (CRESTOR) 10 MG tablet Take 1 tablet on Mon, Wed, Fri Patient taking differently: Take 10 mg by mouth every Monday, Wednesday, and Friday. Take 1 tablet on Mon, Wed, Fri 08/25/19  Yes Brunetta Jeans, [place]    Physical Exam: Vitals:   12/03/19 1401 12/03/19 1415 12/03/19 1457 12/03/19 1530  BP: (!) 136/96 120/85 (!) 107/55 119/80  Pulse: (!) 101 (!) 122 78 86  Resp: 16 (!) 22 (!) 23 (!) 24  Temp:      TempSrc:      SpO2: 98% 100% 99% 100%  Weight:      Height:        Constitutional: NAD, calm, comfortable Vitals:   12/03/19 1401 12/03/19 1415 12/03/19 1457 12/03/19 1530  BP: (!) 136/96 120/85 (!) 107/55 119/80  Pulse: (!) 101 (!) 122 78 86  Resp: 16 (!) 22 (!) 23 (!) 24  Temp:      TempSrc:      SpO2: 98% 100% 99% 100%  Weight:      Height:       Eyes: PERRL, lids and conjunctivae normal ENMT: Mucous membranes are moist. Posterior pharynx clear of any exudate or lesions.Normal dentition.  Neck: normal, supple, no masses, no thyromegaly Respiratory: clear to auscultation bilaterally, no wheezing, no crackles. Normal respiratory effort. No accessory muscle use.  Cardiovascular: Irregular heart rate.  No murmurs / rubs / gallops. No extremity edema. 2+ pedal pulses. No carotid bruits.  Abdomen: no tenderness, no masses palpated. No hepatosplenomegaly. Bowel sounds positive.  Musculoskeletal:  no clubbing / cyanosis. No joint deformity upper and lower extremities. Good ROM, no contractures. Normal muscle tone.  Skin: no rashes, lesions, ulcers. No induration Neurologic: CN 2-12 grossly intact. Sensation intact, DTR normal. Strength 5/5 in all 4.  Psychiatric: Normal judgment and insight. Alert and oriented x 3. Normal mood.     Labs on Admission: I have personally reviewed following labs and imaging studies  CBC: Recent Labs  Lab 12/03/19 1228  WBC 4.6  NEUTROABS 3.2  HGB 16.1  HCT 48.2  MCV 97.0  PLT 010*   Basic Metabolic  Panel: Recent Labs  Lab 12/03/19 1228  NA 138  K 4.4  CL 104  CO2 24  GLUCOSE 149*  BUN 32*  CREATININE 1.83*  CALCIUM 8.3*  MG 1.9   GFR: Estimated Creatinine Clearance: 38 mL/min (A) (by C-G formula based on SCr of 1.83 mg/dL (H)). Liver Function Tests: Recent Labs  Lab 12/03/19 1228  AST 25  ALT 17  ALKPHOS 54  BILITOT 1.0  PROT 6.2*  ALBUMIN 3.2*   No results for input(s): LIPASE, AMYLASE in the last 168 hours. No results for input(s): AMMONIA in the last 168 hours. Coagulation Profile: No results for input(s): INR, PROTIME in the last 168 hours. Cardiac Enzymes: No results for input(s): CKTOTAL, CKMB, CKMBINDEX, TROPONINI in the last 168 hours. BNP (last 3 results) No results for input(s): PROBNP in the last 8760 hours. HbA1C: No results for input(s): HGBA1C in the last 72 hours. CBG: Recent Labs  Lab 12/03/19 1217  GLUCAP 123*   Lipid Profile: No results for input(s): CHOL, HDL, LDLCALC, TRIG, CHOLHDL, LDLDIRECT in the last 72 hours. Thyroid Function Tests: Recent Labs    12/03/19 1228  TSH 2.605  FREET4 1.03   Anemia Panel: No results for input(s): VITAMINB12, FOLATE, FERRITIN, TIBC, IRON, RETICCTPCT in the last 72 hours. Urine analysis:    Component Value Date/Time   COLORURINE YELLOW 09/10/2016 0950   APPEARANCEUR CLEAR 09/10/2016 0950   LABSPEC 1.025 09/10/2016 0950   PHURINE 6.0 09/10/2016 0950    GLUCOSEU >=1000 (A) 09/10/2016 0950   HGBUR NEGATIVE 09/10/2016 0950   BILIRUBINUR negative 06/21/2018 1432   KETONESUR NEGATIVE 09/10/2016 0950   PROTEINUR Positive (A) 06/21/2018 1432   UROBILINOGEN 0.2 06/21/2018 1432   UROBILINOGEN 0.2 09/10/2016 0950   NITRITE negative 06/21/2018 1432   NITRITE NEGATIVE 09/10/2016 0950   LEUKOCYTESUR Negative 06/21/2018 1432    Radiological Exams on Admission: DG Chest Port 1 View  Result Date: 12/03/2019 CLINICAL DATA:  Dyspnea EXAM: PORTABLE CHEST 1 VIEW COMPARISON:  None. FINDINGS: Normal heart size. Mildly tortuous atherosclerotic thoracic aorta. Otherwise normal mediastinal contour. No pneumothorax. No pleural effusion. Lungs appear clear, with no acute consolidative airspace disease and no pulmonary edema. IMPRESSION: No active disease. Electronically Signed   By: Ilona Sorrel M.D.   On: 12/03/2019 13:45    EKG: Independently reviewed.  Rapid A. fib  Assessment/Plan Active Problems:   A-fib (HCC)  (please populate well all problems here in Problem List. (For example, if patient is on BP meds at home and you resume or decide to hold them, it is a problem that needs to be her. Same for CAD, COPD, HLD and so on)  Paroxysms of A. Fib -Continue Cardizem drip, start metoprolol to titrate Cardizem drip -Blood pressure borderline low, will hold BP meds tonight -CHADS2=5, continue heparin drip for tonight expect to switch to Eliquis tomorrow, if H&H stable. -Refer to outpatient A. fib clinic  Syncope -Vasovagal, likely from fast A. Fib -Echo ordered -A. fib control plan as mentioned above.  Positive troponins -No chest pain, EKG no ischemic changes -Trend troponins, follow-up echo  AKI -Receive 1 L of IV bolus in the ED, on Cardizem drip, hold home BP meds for now -Looks like patient creatinine level has been creeping up since earlier this year 1.1>1.4>1.8, will check FeNa and UA.  HTN with borderline hypotension -Hold home BP meds,  start metoprolol for rate control, add as needed labetalol. -If creatinine level stable, consider discharge ARB.  IIDM -Most recent A1c  6.3 -Hold Metformin for worsening of kidney function, start Sliding scale coverage for now.  Remote lacunar stroke -Continue aspirin, plan to start Eliquis tomorrow   DVT prophylaxis: Heparin drip bridging for Eliquis Code Status: Full code Family Communication: Wife and daughter over the phone Disposition Plan: Expect some 2 midnight hospital stay for stabilize heart rate and syncope work-up. Consults called: None Admission status: Tele obs   Lequita Halt MD [facility] Pager [phone] 12/03/2019, 3:57 PM

## 2019-12-03 NOTE — Progress Notes (Signed)
ANTICOAGULATION CONSULT NOTE  Pharmacy Consult for Heparin Indication: atrial fibrillation  Allergies  Allergen Reactions   Antibacterial Hand Soap [Triclosan] Other (See Comments)    Caused water blisters    Patient Measurements: Height: 5\' 11"  (180.3 cm) Weight: 74.8 kg (165 lb) IBW/kg (Calculated) : 75.3 Heparin Dosing Weight: 74.8 kg  Vital Signs: Temp: 97.7 F (36.5 C) (11/07 1222) Temp Source: Oral (11/07 1222) BP: 136/96 (11/07 1401) Pulse Rate: 101 (11/07 1401)  Labs: Recent Labs    12/03/19 1228  HGB 16.1  HCT 48.2  PLT 142*  CREATININE 1.83*  TROPONINIHS 71*    Estimated Creatinine Clearance: 38 mL/min (A) (by C-G formula based on SCr of 1.83 mg/dL (H)).   Medical History: Past Medical History:  Diagnosis Date   Arthritis    Closed fracture dislocation of right elbow    30 years ago -- fell out of theback of a truck   Diabetes mellitus without complication ([place])    Hx of colonic polyps 02/23/2017   Hyperlipidemia    Hypertension    Diagnosed 10 years ago. No on medication. Denies history of CAD.     Medications:  Scheduled:   diltiazem  20 mg Intravenous Once   heparin  4,500 Units Intravenous Once    Assessment: Patient is a 89 yom that presents to the ED in Afib with RVR. The patient is not on any blood thinners at home pta. Pharmacy has been asked to dose heparin at this time for Afib.   Goal of Therapy:  Heparin level 0.3-0.7 units/ml Monitor platelets by anticoagulation protocol: Yes   Plan:  - Heparin bolus 4500 uints IV x 1 dose - Heparin drip @ 1100 units/hr - Heparin level in ~ 8 hours  - Monitor patient for s/s of bleeding and CBC while on heparin   Duanne Limerick PharmD. BCPS  12/03/2019,2:31 PM

## 2019-12-03 NOTE — Progress Notes (Signed)
Received pt with heparin infusing at 75mLhr. Cardizem drip infusing at 10mg /hr. Pt HR 100-147 bpm noted. Increased Cardizem drip as ordered to 12.5mg /hr. Will continue to monitor

## 2019-12-04 ENCOUNTER — Observation Stay (HOSPITAL_COMMUNITY): Payer: Medicare HMO

## 2019-12-04 ENCOUNTER — Inpatient Hospital Stay (HOSPITAL_COMMUNITY): Payer: Medicare HMO

## 2019-12-04 ENCOUNTER — Encounter (HOSPITAL_COMMUNITY): Payer: Self-pay | Admitting: Internal Medicine

## 2019-12-04 ENCOUNTER — Telehealth: Payer: Self-pay | Admitting: Physician Assistant

## 2019-12-04 DIAGNOSIS — U071 COVID-19: Secondary | ICD-10-CM | POA: Diagnosis not present

## 2019-12-04 DIAGNOSIS — I6782 Cerebral ischemia: Secondary | ICD-10-CM | POA: Diagnosis not present

## 2019-12-04 DIAGNOSIS — Z79899 Other long term (current) drug therapy: Secondary | ICD-10-CM | POA: Diagnosis not present

## 2019-12-04 DIAGNOSIS — Z841 Family history of disorders of kidney and ureter: Secondary | ICD-10-CM | POA: Diagnosis not present

## 2019-12-04 DIAGNOSIS — Z801 Family history of malignant neoplasm of trachea, bronchus and lung: Secondary | ICD-10-CM | POA: Diagnosis not present

## 2019-12-04 DIAGNOSIS — I480 Paroxysmal atrial fibrillation: Secondary | ICD-10-CM

## 2019-12-04 DIAGNOSIS — N170 Acute kidney failure with tubular necrosis: Secondary | ICD-10-CM | POA: Diagnosis not present

## 2019-12-04 DIAGNOSIS — Z8249 Family history of ischemic heart disease and other diseases of the circulatory system: Secondary | ICD-10-CM | POA: Diagnosis not present

## 2019-12-04 DIAGNOSIS — Z7982 Long term (current) use of aspirin: Secondary | ICD-10-CM | POA: Diagnosis not present

## 2019-12-04 DIAGNOSIS — I619 Nontraumatic intracerebral hemorrhage, unspecified: Secondary | ICD-10-CM | POA: Diagnosis not present

## 2019-12-04 DIAGNOSIS — I10 Essential (primary) hypertension: Secondary | ICD-10-CM | POA: Diagnosis present

## 2019-12-04 DIAGNOSIS — Z23 Encounter for immunization: Secondary | ICD-10-CM | POA: Diagnosis present

## 2019-12-04 DIAGNOSIS — F17210 Nicotine dependence, cigarettes, uncomplicated: Secondary | ICD-10-CM | POA: Diagnosis present

## 2019-12-04 DIAGNOSIS — I6389 Other cerebral infarction: Secondary | ICD-10-CM | POA: Diagnosis not present

## 2019-12-04 DIAGNOSIS — Z8673 Personal history of transient ischemic attack (TIA), and cerebral infarction without residual deficits: Secondary | ICD-10-CM | POA: Diagnosis not present

## 2019-12-04 DIAGNOSIS — I248 Other forms of acute ischemic heart disease: Secondary | ICD-10-CM | POA: Diagnosis not present

## 2019-12-04 DIAGNOSIS — Z86010 Personal history of colonic polyps: Secondary | ICD-10-CM | POA: Diagnosis not present

## 2019-12-04 DIAGNOSIS — I509 Heart failure, unspecified: Secondary | ICD-10-CM | POA: Diagnosis not present

## 2019-12-04 DIAGNOSIS — R55 Syncope and collapse: Secondary | ICD-10-CM

## 2019-12-04 DIAGNOSIS — Z7984 Long term (current) use of oral hypoglycemic drugs: Secondary | ICD-10-CM | POA: Diagnosis not present

## 2019-12-04 DIAGNOSIS — E785 Hyperlipidemia, unspecified: Secondary | ICD-10-CM | POA: Diagnosis not present

## 2019-12-04 DIAGNOSIS — Z825 Family history of asthma and other chronic lower respiratory diseases: Secondary | ICD-10-CM | POA: Diagnosis not present

## 2019-12-04 DIAGNOSIS — E860 Dehydration: Secondary | ICD-10-CM | POA: Diagnosis not present

## 2019-12-04 DIAGNOSIS — E119 Type 2 diabetes mellitus without complications: Secondary | ICD-10-CM | POA: Diagnosis not present

## 2019-12-04 LAB — CBC WITH DIFFERENTIAL/PLATELET
Abs Immature Granulocytes: 0.02 10*3/uL (ref 0.00–0.07)
Basophils Absolute: 0 10*3/uL (ref 0.0–0.1)
Basophils Relative: 0 %
Eosinophils Absolute: 0 10*3/uL (ref 0.0–0.5)
Eosinophils Relative: 0 %
HCT: 44.4 % (ref 39.0–52.0)
Hemoglobin: 15.4 g/dL (ref 13.0–17.0)
Immature Granulocytes: 0 %
Lymphocytes Relative: 22 %
Lymphs Abs: 1.1 10*3/uL (ref 0.7–4.0)
MCH: 32.6 pg (ref 26.0–34.0)
MCHC: 34.7 g/dL (ref 30.0–36.0)
MCV: 93.9 fL (ref 80.0–100.0)
Monocytes Absolute: 0.3 10*3/uL (ref 0.1–1.0)
Monocytes Relative: 7 %
Neutro Abs: 3.5 10*3/uL (ref 1.7–7.7)
Neutrophils Relative %: 71 %
Platelets: 143 10*3/uL — ABNORMAL LOW (ref 150–400)
RBC: 4.73 MIL/uL (ref 4.22–5.81)
RDW: 13 % (ref 11.5–15.5)
WBC: 4.9 10*3/uL (ref 4.0–10.5)
nRBC: 0 % (ref 0.0–0.2)

## 2019-12-04 LAB — LIPID PANEL
Cholesterol: 158 mg/dL (ref 0–200)
HDL: 31 mg/dL — ABNORMAL LOW (ref >40)
LDL Cholesterol: 101 mg/dL — ABNORMAL HIGH (ref 0–99)
Total CHOL/HDL Ratio: 5.1 RATIO
Triglycerides: 129 mg/dL (ref <150)
VLDL: 26 mg/dL (ref 0–40)

## 2019-12-04 LAB — GLUCOSE, CAPILLARY
Glucose-Capillary: 122 mg/dL — ABNORMAL HIGH (ref 70–99)
Glucose-Capillary: 127 mg/dL — ABNORMAL HIGH (ref 70–99)
Glucose-Capillary: 198 mg/dL — ABNORMAL HIGH (ref 70–99)
Glucose-Capillary: 104 mg/dL — ABNORMAL HIGH (ref 70–99)

## 2019-12-04 LAB — COMPREHENSIVE METABOLIC PANEL
ALT: 18 U/L (ref 0–44)
AST: 22 U/L (ref 15–41)
Albumin: 3.1 g/dL — ABNORMAL LOW (ref 3.5–5.0)
Alkaline Phosphatase: 55 U/L (ref 38–126)
Anion gap: 11 (ref 5–15)
BUN: 31 mg/dL — ABNORMAL HIGH (ref 8–23)
CO2: 22 mmol/L (ref 22–32)
Calcium: 8.3 mg/dL — ABNORMAL LOW (ref 8.9–10.3)
Chloride: 104 mmol/L (ref 98–111)
Creatinine, Ser: 1.47 mg/dL — ABNORMAL HIGH (ref 0.61–1.24)
GFR, Estimated: 50 mL/min — ABNORMAL LOW (ref >60)
Glucose, Bld: 129 mg/dL — ABNORMAL HIGH (ref 70–99)
Potassium: 3.5 mmol/L (ref 3.5–5.1)
Sodium: 137 mmol/L (ref 135–145)
Total Bilirubin: 0.7 mg/dL (ref 0.3–1.2)
Total Protein: 6.2 g/dL — ABNORMAL LOW (ref 6.5–8.1)

## 2019-12-04 LAB — PHOSPHORUS: Phosphorus: 3.1 mg/dL (ref 2.5–4.6)

## 2019-12-04 LAB — TSH: TSH: 0.973 u[IU]/mL (ref 0.350–4.500)

## 2019-12-04 LAB — ECHOCARDIOGRAM COMPLETE
Area-P 1/2: 3.05 cm2
Height: 71 in
S' Lateral: 2.81 cm
Weight: 2640 oz

## 2019-12-04 LAB — HEPARIN LEVEL (UNFRACTIONATED): Heparin Unfractionated: 0.53 IU/mL (ref 0.30–0.70)

## 2019-12-04 LAB — MAGNESIUM: Magnesium: 1.7 mg/dL (ref 1.7–2.4)

## 2019-12-04 LAB — TYPE AND SCREEN
ABO/RH(D): A POS
Antibody Screen: NEGATIVE

## 2019-12-04 LAB — C-REACTIVE PROTEIN: CRP: 2.7 mg/dL — ABNORMAL HIGH (ref <1.0)

## 2019-12-04 LAB — D-DIMER, QUANTITATIVE: D-Dimer, Quant: 1.07 ug/mL-FEU — ABNORMAL HIGH (ref 0.00–0.50)

## 2019-12-04 LAB — ABO/RH: ABO/RH(D): A POS

## 2019-12-04 LAB — TROPONIN I (HIGH SENSITIVITY): Troponin I (High Sensitivity): 54 ng/L — ABNORMAL HIGH (ref <18)

## 2019-12-04 LAB — FERRITIN: Ferritin: 130 ng/mL (ref 24–336)

## 2019-12-04 LAB — BRAIN NATRIURETIC PEPTIDE: B Natriuretic Peptide: 190.3 pg/mL — ABNORMAL HIGH (ref 0.0–100.0)

## 2019-12-04 MED ORDER — METOPROLOL TARTRATE 100 MG PO TABS: 100.0000 mg | ORAL_TABLET | Freq: Two times a day (BID) | ORAL | Status: DC

## 2019-12-04 MED ORDER — METOPROLOL TARTRATE 50 MG PO TABS
50.0000 mg | ORAL_TABLET | Freq: Two times a day (BID) | ORAL | Status: DC
  Administered 2019-12-04 – 2019-12-05 (×3): 50 mg via ORAL
  Filled 2019-12-04 (×3): qty 1

## 2019-12-04 MED ORDER — LORAZEPAM 2 MG/ML IJ SOLN: 1.0000 mg | Freq: Once | INTRAMUSCULAR | Status: DC | PRN

## 2019-12-04 MED ORDER — APIXABAN 5 MG PO TABS
5.0000 mg | ORAL_TABLET | Freq: Two times a day (BID) | ORAL | Status: DC
  Administered 2019-12-04 – 2019-12-05 (×3): 5 mg via ORAL
  Filled 2019-12-04: qty 2
  Filled 2019-12-04 (×2): qty 1

## 2019-12-04 MED ORDER — PANTOPRAZOLE SODIUM 40 MG PO TBEC
40.0000 mg | DELAYED_RELEASE_TABLET | Freq: Every day | ORAL | Status: DC
  Administered 2019-12-04 – 2019-12-05 (×2): 40 mg via ORAL
  Filled 2019-12-04 (×2): qty 1

## 2019-12-04 MED ORDER — ASPIRIN EC 81 MG PO TBEC: 81.0000 mg | DELAYED_RELEASE_TABLET | Freq: Every day | ORAL | Status: DC

## 2019-12-04 NOTE — Progress Notes (Signed)
PT Cancellation Note  Patient Details Name: John Woodward. MRN: [id number] DOB: February 26, 1945   Cancelled Treatment:    Reason Eval/Treat Not Completed: Patient at procedure or test/unavailable  Patient eating l,unch. Asked PT to return later  Barry Brunner, PT  Jeanie Cooks Arlisa Leclere 12/04/2019, 3:19 PM

## 2019-12-04 NOTE — Progress Notes (Signed)
ANTICOAGULATION CONSULT NOTE - Initial Consult  Pharmacy Consult for heparin >>apixaban Indication: atrial fibrillation  Allergies  Allergen Reactions  . Antibacterial Hand Soap [Triclosan] Other (See Comments)    Caused water blisters    Patient Measurements: Height: 5\' 11"  (180.3 cm) Weight: 74.8 kg (165 lb) IBW/kg (Calculated) : 75.3  Vital Signs: Temp: 98.9 F (37.2 C) (11/08 0728) Temp Source: Oral (11/08 0728) BP: 146/88 (11/08 0728) Pulse Rate: 99 (11/08 0728)  Labs: Recent Labs    12/03/19 1228 12/04/19 0329  HGB 16.1 15.4  HCT 48.2 44.4  PLT 142* 143*  HEPARINUNFRC  --  0.53  CREATININE 1.83* 1.47*  TROPONINIHS 71* 54*    Estimated Creatinine Clearance: 47.4 mL/min (A) (by C-G formula based on SCr of 1.47 mg/dL (H)).   Medical History: Past Medical History:  Diagnosis Date  . Arthritis   . Closed fracture dislocation of right elbow    30 years ago -- fell out of theback of a truck  . Diabetes mellitus without complication ([place])   . Hx of colonic polyps 02/23/2017  . Hyperlipidemia   . Hypertension    Diagnosed 10 years ago. No on medication. Denies history of CAD.     Medications:  Medications Prior to Admission  Medication Sig Dispense Refill Last Dose  . acetaminophen (TYLENOL) 500 MG tablet Take 500-1,000 mg by mouth every 6 (six) hours as needed for mild pain (or headaches).    unk  . aspirin (BAYER LOW DOSE) 81 MG EC tablet Take 162 mg by mouth at bedtime. Swallow whole.   12/02/2019 at 2130  . hydrochlorothiazide (HYDRODIURIL) 25 MG tablet Take 1 tablet (25 mg total) by mouth daily. 90 tablet 1 12/03/2019 at am  . ibuprofen (ADVIL) 200 MG tablet Take 400 mg by mouth every 6 (six) hours as needed for mild pain (or headaches).   unk  . losartan (COZAAR) 100 MG tablet Take 1 tablet (100 mg total) by mouth daily. 90 tablet 1 12/03/2019 at am  . metFORMIN (GLUCOPHAGE) 500 MG tablet TAKE 1 TABLET BY MOUTH DAILY IN THE MORNING AND 2 TABLETS DAILY IN THE  EVENING (Patient taking differently: Take 500-1,000 mg by mouth See admin instructions. Take 500 mg by mouth in the morning and 1,000 mg at bedtime) 270 tablet 1 12/03/2019 at am  . nicotine polacrilex (COMMIT) 4 MG lozenge Take 4 mg by mouth as needed for smoking cessation.   Past Week at Unknown time  . potassium chloride SA (KLOR-CON) 20 MEQ tablet Take 1 tablet (20 mEq total) by mouth daily. 90 tablet 1 12/02/2019 at am  . rosuvastatin (CRESTOR) 10 MG tablet Take 1 tablet on Mon, Wed, Fri (Patient taking differently: Take 10 mg by mouth every Monday, Wednesday, and Friday. Take 1 tablet on Mon, Wed, Fri) 36 tablet 3 12/01/2019 at Unknown time    Assessment: Patient with new onset atrial fibrillation, incidentally COVID +, previously vaccinated, with PMH  HTN, HLD, IIDM. Pharmacy consulted to transition patient from heparin to apixaban for AFib. Not on anticoagulation PTA, on aspirin PTA.  Goal of Therapy:  Anticoagulation Monitor platelets by anticoagulation protocol: Yes   Plan:  Apixaban 5 mg PO BID Monitor for signs and symptoms of bleeding Discontinue use of OTC ibuprofen and other NSAIDs (with exception of prescribed daily aspirin)   Thank you for allowing [place] to participate in this patients care.   Jens Som, PharmD Please see amion for complete clinical pharmacist phone list. 12/04/2019 8:26 AM

## 2019-12-04 NOTE — Discharge Instructions (Addendum)
Follow with Primary MD Brunetta Jeans, PA-C in 7 days   Get CBC, CMP, 2 view Chest X ray -  checked next visit within 1 week by Primary MD  Activity: As tolerated with Full fall precautions use walker/cane & assistance as needed  Disposition Home   Diet: Heart Healthy Low Carb  Special Instructions: If you have smoked or chewed Tobacco  in the last 2 yrs please stop smoking, stop any regular Alcohol  and or any Recreational drug use.  On your next visit with your primary care physician please Get Medicines reviewed and adjusted.  Please request your Prim.MD to go over all Hospital Tests and Procedure/Radiological results at the follow up, please get all Hospital records sent to your Prim MD by signing hospital release before you go home.  If you experience worsening of your admission symptoms, develop shortness of breath, life threatening emergency, suicidal or homicidal thoughts you must seek medical attention immediately by calling 911 or calling your MD immediately  if symptoms less severe.  You Must read complete instructions/literature along with all the possible adverse reactions/side effects for all the Medicines you take and that have been prescribed to you. Take any new Medicines after you have completely understood and accpet all the possible adverse reactions/side effects.            Person Under Monitoring Name: John Woodward.  Location: [place] [address]   Infection Prevention Recommendations for Individuals Confirmed to have, or Being Evaluated for, 2019 Novel Coronavirus (COVID-19) Infection Who Receive Care at Home  Individuals who are confirmed to have, or are being evaluated for, COVID-19 should follow the prevention steps below until a healthcare provider or local or state health department says they can return to normal activities.  Stay home except to get medical care You should restrict activities outside your home, except  for getting medical care. Do not go to work, school, or public areas, and do not use public transportation or taxis.  Call ahead before visiting your doctor Before your medical appointment, call the healthcare provider and tell them that you have, or are being evaluated for, COVID-19 infection. This will help the healthcare provider's office take steps to keep other people from getting infected. Ask your healthcare provider to call the local or state health department.  Monitor your symptoms Seek prompt medical attention if your illness is worsening (e.g., difficulty breathing). Before going to your medical appointment, call the healthcare provider and tell them that you have, or are being evaluated for, COVID-19 infection. Ask your healthcare provider to call the local or state health department.  Wear a facemask You should wear a facemask that covers your nose and mouth when you are in the same room with other people and when you visit a healthcare provider. People who live with or visit you should also wear a facemask while they are in the same room with you.  Separate yourself from other people in your home As much as possible, you should stay in a different room from other people in your home. Also, you should use a separate bathroom, if available.  Avoid sharing household items You should not share dishes, drinking glasses, cups, eating utensils, towels, bedding, or other items with other people in your home. After using these items, you should wash them thoroughly with soap and water.  Cover your coughs and sneezes Cover your mouth and nose with a tissue when you cough or sneeze,  or you can cough or sneeze into your sleeve. Throw used tissues in a lined trash can, and immediately wash your hands with soap and water for at least 20 seconds or use an alcohol-based hand rub.  Wash your [facility] your hands often and thoroughly with soap and water for at least 20 seconds. You can  use an alcohol-based hand sanitizer if soap and water are not available and if your hands are not visibly dirty. Avoid touching your eyes, nose, and mouth with unwashed hands.   Prevention Steps for Caregivers and Household Members of Individuals Confirmed to have, or Being Evaluated for, COVID-19 Infection Being Cared for in the Home  If you live with, or provide care at home for, a person confirmed to have, or being evaluated for, COVID-19 infection please follow these guidelines to prevent infection:  Follow healthcare provider's instructions Make sure that you understand and can help the patient follow any healthcare provider instructions for all care.  Provide for the patient's basic needs You should help the patient with basic needs in the home and provide support for getting groceries, prescriptions, and other personal needs.  Monitor the patient's symptoms If they are getting sicker, call his or her medical provider and tell them that the patient has, or is being evaluated for, COVID-19 infection. This will help the healthcare provider's office take steps to keep other people from getting infected. Ask the healthcare provider to call the local or state health department.  Limit the number of people who have contact with the patient  If possible, have only one caregiver for the patient.  Other household members should stay in another home or place of residence. If this is not possible, they should stay  in another room, or be separated from the patient as much as possible. Use a separate bathroom, if available.  Restrict visitors who do not have an essential need to be in the home.  Keep older adults, very young children, and other sick people away from the patient Keep older adults, very young children, and those who have compromised immune systems or chronic health conditions away from the patient. This includes people with chronic heart, lung, or kidney conditions,  diabetes, and cancer.  Ensure good ventilation Make sure that shared spaces in the home have good air flow, such as from an air conditioner or an opened window, weather permitting.  Wash your hands often  Wash your hands often and thoroughly with soap and water for at least 20 seconds. You can use an alcohol based hand sanitizer if soap and water are not available and if your hands are not visibly dirty.  Avoid touching your eyes, nose, and mouth with unwashed hands.  Use disposable paper towels to dry your hands. If not available, use dedicated cloth towels and replace them when they become wet.  Wear a facemask and gloves  Wear a disposable facemask at all times in the room and gloves when you touch or have contact with the patient's blood, body fluids, and/or secretions or excretions, such as sweat, saliva, sputum, nasal mucus, vomit, urine, or feces.  Ensure the mask fits over your nose and mouth tightly, and do not touch it during use.  Throw out disposable facemasks and gloves after using them. Do not reuse.  Wash your hands immediately after removing your facemask and gloves.  If your personal clothing becomes contaminated, carefully remove clothing and launder. Wash your hands after handling contaminated clothing.  Place all used disposable  facemasks, gloves, and other waste in a lined container before disposing them with other household waste.  Remove gloves and wash your hands immediately after handling these items.  Do not share dishes, glasses, or other household items with the patient  Avoid sharing household items. You should not share dishes, drinking glasses, cups, eating utensils, towels, bedding, or other items with a patient who is confirmed to have, or being evaluated for, COVID-19 infection.  After the person uses these items, you should wash them thoroughly with soap and water.  Wash laundry thoroughly  Immediately remove and wash clothes or bedding that have  blood, body fluids, and/or secretions or excretions, such as sweat, saliva, sputum, nasal mucus, vomit, urine, or feces, on them.  Wear gloves when handling laundry from the patient.  Read and follow directions on labels of laundry or clothing items and detergent. In general, wash and dry with the warmest temperatures recommended on the label.  Clean all areas the individual has used often  Clean all touchable surfaces, such as counters, tabletops, doorknobs, bathroom fixtures, toilets, phones, keyboards, tablets, and bedside tables, every day. Also, clean any surfaces that may have blood, body fluids, and/or secretions or excretions on them.  Wear gloves when cleaning surfaces the patient has come in contact with.  Use a diluted bleach solution (e.g., dilute bleach with 1 part bleach and 10 parts water) or a household disinfectant with a label that says EPA-registered for coronaviruses. To make a bleach solution at home, add 1 tablespoon of bleach to 1 quart (4 cups) of water. For a larger supply, add  cup of bleach to 1 gallon (16 cups) of water.  Read labels of cleaning products and follow recommendations provided on product labels. Labels contain instructions for safe and effective use of the cleaning product including precautions you should take when applying the product, such as wearing gloves or eye protection and making sure you have good ventilation during use of the product.  Remove gloves and wash hands immediately after cleaning.  Monitor yourself for signs and symptoms of illness Caregivers and household members are considered close contacts, should monitor their health, and will be asked to limit movement outside of the home to the extent possible. Follow the monitoring steps for close contacts listed on the symptom monitoring form.   ? If you have additional questions, contact your local health department or call the epidemiologist on call at [phone] (available 24/7). ?  This guidance is subject to change. For the most up-to-date guidance from [facility], please refer to their website: YouBlogs.pl     Information on my medicine - ELIQUIS (apixaban)  This medication education was reviewed with me or my healthcare representative as part of my discharge preparation.    Atrial Fibrillation  Atrial fibrillation is a type of heartbeat that is irregular or fast. If you have this condition, your heart beats without any order. This makes it hard for your heart to pump blood in a normal way. Atrial fibrillation may come and go, or it may become a long-lasting problem. If this condition is not treated, it can put you at higher risk for stroke, heart failure, and other heart problems. What are the causes? This condition may be caused by diseases that damage the heart. They include:  High blood pressure.  Heart failure.  Heart valve disease.  Heart surgery. Other causes include:  Diabetes.  Thyroid disease.  Being overweight.  Kidney disease. Sometimes the cause is not known. What increases the  risk? You are more likely to develop this condition if:  You are older.  You smoke.  You exercise often and very hard.  You have a family history of this condition.  You are a man.  You use drugs.  You drink a lot of alcohol.  You have lung conditions, such as emphysema, pneumonia, or COPD.  You have sleep apnea. What are the signs or symptoms? Common symptoms of this condition include:  A feeling that your heart is beating very fast.  Chest pain or discomfort.  Feeling short of breath.  Suddenly feeling light-headed or weak.  Getting tired easily during activity.  Fainting.  Sweating. In some cases, there are no symptoms. How is this treated? Treatment for this condition depends on underlying conditions and how you feel when you have atrial fibrillation. They  include:  Medicines to: ? Prevent blood clots. ? Treat heart rate or heart rhythm problems.  Using devices, such as a pacemaker, to correct heart rhythm problems.  Doing surgery to remove the part of the heart that sends bad signals.  Closing an area where clots can form in the heart (left atrial appendage). In some cases, your doctor will treat other underlying conditions. Follow these instructions at home: Medicines  Take over-the-counter and prescription medicines only as told by your doctor.  Do not take any new medicines without first talking to your doctor.  If you are taking blood thinners: ? Talk with your doctor before you take any medicines that have aspirin or NSAIDs, such as ibuprofen, in them. ? Take your medicine exactly as told by your doctor. Take it at the same time each day. ? Avoid activities that could hurt or bruise you. Follow instructions about how to prevent falls. ? Wear a bracelet that says you are taking blood thinners. Or, carry a card that lists what medicines you take. Lifestyle      Do not use any products that have nicotine or tobacco in them. These include cigarettes, e-cigarettes, and chewing tobacco. If you need help quitting, ask your doctor.  Eat heart-healthy foods. Talk with your doctor about the right eating plan for you.  Exercise regularly as told by your doctor.  Do not drink alcohol.  Lose weight if you are overweight.  Do not use drugs, including cannabis. General instructions  If you have a condition that causes breathing to stop for a short period of time (apnea), treat it as told by your doctor.  Keep a healthy weight. Do not use diet pills unless your doctor says they are safe for you. Diet pills may make heart problems worse.  Keep all follow-up visits as told by your doctor. This is important. Contact a doctor if:  You notice a change in the speed, rhythm, or strength of your heartbeat.  You are taking a  blood-thinning medicine and you get more bruising.  You get tired more easily when you move or exercise.  You have a sudden change in weight. Get help right away if:   You have pain in your chest or your belly (abdomen).  You have trouble breathing.  You have side effects of blood thinners, such as blood in your vomit, poop (stool), or pee (urine), or bleeding that cannot stop.  You have any signs of a stroke. "BE FAST" is an easy way to remember the main warning signs: ? B - Balance. Signs are dizziness, sudden trouble walking, or loss of balance. ? E - Eyes. Signs are trouble  seeing or a change in how you see. ? F - Face. Signs are sudden weakness or loss of feeling in the face, or the face or eyelid drooping on one side. ? A - Arms. Signs are weakness or loss of feeling in an arm. This happens suddenly and usually on one side of the body. ? S - Speech. Signs are sudden trouble speaking, slurred speech, or trouble understanding what people say. ? T - Time. Time to call emergency services. Write down what time symptoms started.  You have other signs of a stroke, such as: ? A sudden, very bad headache with no known cause. ? Feeling like you may vomit (nausea). ? Vomiting. ? A seizure. These symptoms may be an emergency. Do not wait to see if the symptoms will go away. Get medical help right away. Call your local emergency services (911 in the [place] Do not drive yourself to the hospital. Summary  Atrial fibrillation is a type of heartbeat that is irregular or fast.  You are at higher risk of this condition if you smoke, are older, have diabetes, or are overweight.  Follow your doctor's instructions about medicines, diet, exercise, and follow-up visits.  Get help right away if you have signs or symptoms of a stroke.  Get help right away if you cannot catch your breath, or you have chest pain or discomfort. This information is not intended to replace advice given to you by your  health care provider. Make sure you discuss any questions you have with your health care provider. Document Revised: 07/06/2018 Document Reviewed: 07/06/2018 Elsevier Patient Education  [place].  Why was Eliquis prescribed for you? Eliquis was prescribed for you to reduce the risk of a blood clot forming that can cause a stroke if you have a medical condition called atrial fibrillation (a type of irregular heartbeat).  What do You need to know about Eliquis ? Take your Eliquis TWICE DAILY - one tablet in the morning and one tablet in the evening with or without food. If you have difficulty swallowing the tablet whole please discuss with your pharmacist how to take the medication safely.  Take Eliquis exactly as prescribed by your doctor and DO NOT stop taking Eliquis without talking to the doctor who prescribed the medication.  Stopping may increase your risk of developing a stroke.  Refill your prescription before you run out.  After discharge, you should have regular check-up appointments with your healthcare provider that is prescribing your Eliquis.  In the future your dose may need to be changed if your kidney function or weight changes by a significant amount or as you get older.  What do you do if you miss a dose? If you miss a dose, take it as soon as you remember on the same day and resume taking twice daily.  Do not take more than one dose of ELIQUIS at the same time to make up a missed dose.  Important Safety Information A possible side effect of Eliquis is bleeding. You should call your healthcare provider right away if you experience any of the following: ? Bleeding from an injury or your nose that does not stop. ? Unusual colored urine (red or dark brown) or unusual colored stools (red or black). ? Unusual bruising for unknown reasons. ? A serious fall or if you hit your head (even if there is no bleeding).  Some medicines may interact with Eliquis and might  increase your risk of bleeding or clotting  while on Eliquis. To help avoid this, consult your healthcare provider or pharmacist prior to using any new prescription or non-prescription medications, including herbals, vitamins, non-steroidal anti-inflammatory drugs (NSAIDs) and supplements.  This website has more information on Eliquis (apixaban): http://www.eliquis.com/eliquis/home

## 2019-12-04 NOTE — Progress Notes (Signed)
  Echocardiogram 2D Echocardiogram has been performed.  Brizeida Mcmurry G Tashae Inda 12/04/2019, 10:03 AM

## 2019-12-04 NOTE — Plan of Care (Signed)

## 2019-12-04 NOTE — TOC Benefit Eligibility Note (Signed)
Transition of Care [facility]) Benefit Eligibility Note    Patient Details  Name: John Woodward. MRN: [id number] Date of Birth: Jan 21, 1946   Medication/Dose: Arne [place]  Covered?: Yes  Tier: 3 Drug  Prescription Coverage Preferred Pharmacy: [place] with Person/Company/Phone Number:: ROB  @ [employer] # [phone]  Co-Pay: $47.00  Prior Approval: No  Deductible: Met       Memory Argue Phone Number: 12/04/2019, 1:16 PM

## 2019-12-04 NOTE — Telephone Encounter (Signed)
PCP is aware of patient in hospital

## 2019-12-04 NOTE — Procedures (Signed)
Patient Name: John Woodward.  MRN: [id number]  Epilepsy Attending: Lora Havens  Referring Physician/Provider: Dr Lala Lund Date: 12/04/2019 Duration: 26.33 mins  Patient history: 74 year old male with syncope.  EEG to evaluate for seizures.  Level of alertness: Awake, asleep  AEDs during EEG study: None  Technical aspects: This EEG study was done with scalp electrodes positioned according to the 10-20 International system of electrode placement. Electrical activity was acquired at a sampling rate of 500Hz  and reviewed with a high frequency filter of 70Hz  and a low frequency filter of 1Hz . EEG data were recorded continuously and digitally stored.   Description: The posterior dominant rhythm consists of 9-10 Hz activity of moderate voltage (25-35 uV) seen predominantly in posterior head regions, symmetric and reactive to eye opening and eye closing. Sleep was characterized by vertex waves, sleep spindles (12 to 14 Hz), maximal frontocentral region.    Hyperventilation and photic stimulation were not performed.     IMPRESSION: This study is within normal limits. No seizures or epileptiform discharges were seen throughout the recording.  John Woodward [name]

## 2019-12-04 NOTE — Progress Notes (Signed)
Pt HR 55-60 bpm, Cardizem drip stopped as ordered. Bp 125/66 (82). Pt resting comfortably. Denies discomfort. Message sent to Dr. Konrad Felix. Will continue to monitor

## 2019-12-04 NOTE — Care Management (Signed)
Benefits check for co-pay cost for Eliquis has been submitted to [facility]. TOC Team will continue to monitor. Ricki Miller, RN BSN Case Manager [phone].

## 2019-12-04 NOTE — Progress Notes (Signed)
EEG complete - results pending 

## 2019-12-04 NOTE — Progress Notes (Signed)
PROGRESS NOTE                                                                                                                                                                                                             Patient Demographics:    John Woodward, is a 74 y.o. male, DOB - 1945-03-05, VCB:[id number]  Outpatient Primary MD for the patient is Delorse Limber    LOS - 0  Admit date - 12/03/2019    Chief Complaint  Patient presents with   Near Syncope       Brief Narrative (HPI from H&P)  - John Woodward. is a 74 y.o. male with medical history significant of HTN, HLD, IIDM, presented with syncope episode.  This happened this morning, patient was sitting next to the kitchen table suddenly fell forward on the table top and became unresponsive for about 2 to 3 minutes.  Wife at bedside witnessed and reported patient appeared cold clammy and pale, and breathing rapidly, was brought to the ER by EMS.  In the ER initially his heart rate and vital signs were stable, he subsequently went into A. fib RVR and was also found to have incidental COVID-19 infection.  Of note he is vaccinated.   Subjective:    Carlyon Prows today has, No headache, No chest pain, No abdominal pain - No Nausea, No new weakness tingling or numbness, no Cough - SOB.    Assessment  & Plan :    Active Problems:   A-fib ([place])   1. Incidental COVID-19 infection in a fully vaccinated patient.  No symptoms - appropriately has been treated with antibody infusion, monitor.  Encouraged the patient to sit up in chair in the daytime use I-S and flutter valve for pulmonary toiletry and then prone in bed when at night.  Will advance activity and titrate down oxygen as possible.    SpO2: 96 %  Recent Labs  Lab 12/03/19 1228 12/03/19 1427 12/04/19 0329  WBC 4.6  --  4.9  HGB 16.1  --  15.4  HCT 48.2  --  44.4  PLT 142*  --  143*  CRP 1.7*   --  2.7*  BNP 117.0*  --   --   DDIMER 1.48*  --  1.07*  PROCALCITON 0.22  --   --  AST 25  --  22  ALT 17  --  18  ALKPHOS 54  --  55  BILITOT 1.0  --  0.7  ALBUMIN 3.2*  --  3.1*  SARSCOV2NAA  --  POSITIVE*  --      2. Syncope - unclear reason, could be due to dehydration as he had AKI, initially when he came to the ER he was in sinus rhythm, preceding to syncope he had no symptoms and this happened while he was sitting on a stool.  We will also check a EEG, MRI brain, check echocardiogram and orthostatics, PT OT and monitor on telemetry.   3.  Proximal A. fib, [place] vas 2 score of at least 3.  Lacunar infarct on previous MRI brain.  Currently on Lopressor goal will be rate control, TSH is stable, echo pending, placed on Eliquis.  Post discharged follow-up with cardiology outpatient.  4.  AKI.  Improving with IV fluids, likely ATN from dehydration, continue gentle hydration and monitor renal function.  Good urine output.  5.  Early cognitive impairment with remote history of lacunar CVA on MRI.  Supportive care.  If he is on Eliquis will stop aspirin.  6..  On PPI.  7.  Mild increase in troponin and non-ACS pattern likely demand ischemia from RVR, chest pain-free, get echocardiogram, no further work-up.  Continue beta-blocker and statin for secondary prevention, aspirin being switched to Eliquis.  Outpatient cardiology follow-up.  8.  DM type II.  Sliding scale.  Recent A1c 6.3.    Condition - Fair  Family Communication  : Wife [place] [phone] on December 04, 2019  Code Status :  Full  Consults  :  None  Procedures  :    TTE  PUD Prophylaxis : PPI  Disposition Plan  :    Status is: Inpt  Dispo: The patient is from: Home              Anticipated d/c is to: Home              Anticipated d/c date is: 2 days              Patient currently is not medically stable to d/c.   DVT Prophylaxis  :  Eliquis  Lab Results  Component Value Date   PLT 143 (L)  12/04/2019    Diet :  Diet Order            Diet heart healthy/carb modified Room service appropriate? Yes; Fluid consistency: Thin  Diet effective now                  Inpatient Medications  Scheduled Meds:  apixaban  5 mg Oral BID   insulin aspart  0-9 Units Subcutaneous TID WC   metoprolol tartrate  50 mg Oral BID   pantoprazole  40 mg Oral Daily   rosuvastatin  10 mg Oral Q M,W,F   Continuous Infusions:  sodium chloride     PRN Meds:.sodium chloride, acetaminophen, albuterol, guaiFENesin-dextromethorphan, LORazepam, ondansetron (ZOFRAN) IV  Antibiotics  :    Anti-infectives (From admission, onward)   None       Time Spent in minutes  30   John Woodward M.D on 12/04/2019 at 10:22 AM  To page go to [email or web address] - password [facility]  [facility] -  Office  [phone]    See all Orders from today for further details    Objective:   Vitals:   12/04/19 0230 12/04/19 0400  12/04/19 0520 12/04/19 0728  BP: 134/83 124/62 125/66 (!) 146/88  Pulse: 81 82 (!) 56 99  Resp: 18 16 20 20   Temp:  98.4 F (36.9 C)  98.9 F (37.2 C)  TempSrc:  Oral  Oral  SpO2: 99% 99% 100% 96%  Weight:      Height:        Wt Readings from Last 3 Encounters:  12/03/19 74.8 kg  08/24/19 73 kg  10/19/18 74.8 kg     Intake/Output Summary (Last 24 hours) at 12/04/2019 1022 Last data filed at 12/04/2019 0900 Gross per 24 hour  Intake 120 ml  Output 600 ml  Net -480 ml     Physical Exam  Awake Alert, No new F.N deficits, Normal affect [place].AT,PERRAL Supple Neck,No JVD, No cervical lymphadenopathy appriciated.  Symmetrical Chest wall movement, Good air movement bilaterally, CTAB iRRR,No Gallops,Rubs or new Murmurs, No Parasternal Heave +ve B.Sounds, Abd Soft, No tenderness, No organomegaly appriciated, No rebound - guarding or rigidity. No Cyanosis, Clubbing or edema, No new Rash or bruise      Data Review:    CBC Recent Labs  Lab 12/03/19 1228  12/04/19 0329  WBC 4.6 4.9  HGB 16.1 15.4  HCT 48.2 44.4  PLT 142* 143*  MCV 97.0 93.9  MCH 32.4 32.6  MCHC 33.4 34.7  RDW 13.2 13.0  LYMPHSABS 1.0 1.1  MONOABS 0.4 0.3  EOSABS 0.0 0.0  BASOSABS 0.0 0.0    Recent Labs  Lab 12/03/19 1228 12/04/19 0329  NA 138 137  K 4.4 3.5  CL 104 104  CO2 24 22  GLUCOSE 149* 129*  BUN 32* 31*  CREATININE 1.83* 1.47*  CALCIUM 8.3* 8.3*  AST 25 22  ALT 17 18  ALKPHOS 54 55  BILITOT 1.0 0.7  ALBUMIN 3.2* 3.1*  MG 1.9 1.7  CRP 1.7* 2.7*  DDIMER 1.48* 1.07*  PROCALCITON 0.22  --   TSH 2.605 0.973  HGBA1C 6.2*  --   BNP 117.0*  --     ------------------------------------------------------------------------------------------------------------------ Recent Labs    12/04/19 0329  CHOL 158  HDL 31*  LDLCALC 101*  TRIG 129  CHOLHDL 5.1    Lab Results  Component Value Date   HGBA1C 6.2 (H) 12/03/2019   ------------------------------------------------------------------------------------------------------------------ Recent Labs    12/04/19 0329  TSH 0.973    Cardiac Enzymes No results for input(s): CKMB, TROPONINI, MYOGLOBIN in the last 168 hours.  Invalid input(s): CK ------------------------------------------------------------------------------------------------------------------    Component Value Date/Time   BNP 117.0 (H) 12/03/2019 1228    Micro Results Recent Results (from the past 240 hour(s))  Respiratory Panel by RT PCR (Flu A&B, Covid) - Nasopharyngeal Swab     Status: Abnormal   Collection Time: 12/03/19  2:27 PM   Specimen: Nasopharyngeal Swab  Result Value Ref Range Status   SARS Coronavirus 2 by RT PCR POSITIVE (A) NEGATIVE Final    Comment: RESULT CALLED TO, READ BACK BY AND VERIFIED WITH: RN A STRAYTER [id number] [phone] (NOTE) SARS-CoV-2 target nucleic acids are DETECTED.  SARS-CoV-2 RNA is generally detectable in upper respiratory specimens  during the acute phase of infection. Positive results  are indicative of the presence of the identified virus, but do not rule out bacterial infection or co-infection with other pathogens not detected by the test. Clinical correlation with patient history and other diagnostic information is necessary to determine patient infection status. The expected result is Negative.  Fact Sheet for Patients:  PinkCheek.be  Fact Sheet for  Healthcare Providers: GravelBags.it  This test is not yet approved or cleared by the [place] and  has been authorized for detection and/or diagnosis of SARS-CoV-2 by FDA under an Emergency Use Authorization (EUA).  This EUA will remain in effect (meaning this test can be used)  for the duration of  the COVID-19 declaration under Section 564(b)(1) of the Act, 21 U.S.C. section 360bbb-3(b)(1), unless the authorization is terminated or revoked sooner.      Influenza A by PCR NEGATIVE NEGATIVE Final   Influenza B by PCR NEGATIVE NEGATIVE Final    Comment: (NOTE) The Xpert Xpress SARS-CoV-2/FLU/RSV assay is intended as an aid in  the diagnosis of influenza from Nasopharyngeal swab specimens and  should not be used as a sole basis for treatment. Nasal washings and  aspirates are unacceptable for Xpert Xpress SARS-CoV-2/FLU/RSV  testing.  Fact Sheet for Patients: PinkCheek.be  Fact Sheet for Healthcare Providers: GravelBags.it  This test is not yet approved or cleared by the [place] FDA and  has been authorized for detection and/or diagnosis of SARS-CoV-2 by  FDA under an Emergency Use Authorization (EUA). This EUA will remain  in effect (meaning this test can be used) for the duration of the  Covid-19 declaration under Section 564(b)(1) of the Act, 21  U.S.C. section 360bbb-3(b)(1), unless the authorization is  terminated or revoked. Performed at [facility], [place] [address], [place], [place] [address]     Radiology Reports DG Chest 1 View  Result Date: 12/04/2019 CLINICAL DATA:  Congestive heart failure. COVID positive. EXAM: CHEST  1 VIEW COMPARISON:  12/03/2019 chest x-ray. FINDINGS: The cardiac silhouette, mediastinal and hilar contours are within normal limits and stable. The lungs are clear of an acute process. No pleural effusions or pulmonary lesions. IMPRESSION: No acute cardiopulmonary findings. Electronically Signed   By: Marijo Sanes M.D.   On: 12/04/2019 07:56   DG Chest Port 1 View  Result Date: 12/03/2019 CLINICAL DATA:  Dyspnea EXAM: PORTABLE CHEST 1 VIEW COMPARISON:  None. FINDINGS: Normal heart size. Mildly tortuous atherosclerotic thoracic aorta. Otherwise normal mediastinal contour. No pneumothorax. No pleural effusion. Lungs appear clear, with no acute consolidative airspace disease and no pulmonary edema. IMPRESSION: No active disease. Electronically Signed   By: Ilona Sorrel M.D.   On: 12/03/2019 13:45   ECHOCARDIOGRAM COMPLETE  Result Date: 12/04/2019    ECHOCARDIOGRAM REPORT   Patient Name:   Lily Kernen. Date of Exam: 12/04/2019 Medical Rec #:  [id number]           Height:       71.0 in Accession #:    [id number]          Weight:       165.0 lb Date of Birth:  [birth year]           BSA:          1.943 m Patient Age:    63 years            BP:           105/68 mmHg Patient Gender: M                   HR:           93 bpm. Exam Location:  Inpatient Procedure: 2D Echo, Cardiac Doppler and Color Doppler Indications:    R55 Syncope  History:        Patient has prior history of Echocardiogram examinations,  most                 recent 03/24/2018. Risk Factors:Hypertension, Diabetes and                 Dyslipidemia. COVID-19 Positive.  Sonographer:    Tiffany Dance Referring Phys: [id number] [place]  1. ? patient in afib during exam.  2. Left ventricular ejection fraction, by estimation, is 60 to 65%. The left ventricle has normal  function. The left ventricle has no regional wall motion abnormalities. Left ventricular diastolic parameters are indeterminate.  3. Right ventricular systolic function is normal. The right ventricular size is normal.  4. Left atrial size was mildly dilated.  5. The mitral valve is degenerative. Trivial mitral valve regurgitation. No evidence of mitral stenosis.  6. The aortic valve was not well visualized. Aortic valve regurgitation is not visualized. Mild to moderate aortic valve sclerosis/calcification is present, without any evidence of aortic stenosis.  7. The inferior vena cava is normal in size with greater than 50% respiratory variability, suggesting right atrial pressure of 3 mmHg. FINDINGS  Left Ventricle: Left ventricular ejection fraction, by estimation, is 60 to 65%. The left ventricle has normal function. The left ventricle has no regional wall motion abnormalities. The left ventricular internal cavity size was normal in size. There is  no left ventricular hypertrophy. Left ventricular diastolic parameters are indeterminate. Right Ventricle: The right ventricular size is normal. No increase in right ventricular wall thickness. Right ventricular systolic function is normal. Left Atrium: Left atrial size was mildly dilated. Right Atrium: Right atrial size was normal in size. Pericardium: There is no evidence of pericardial effusion. Mitral Valve: The mitral valve is degenerative in appearance. There is mild thickening of the mitral valve leaflet(s). There is mild calcification of the mitral valve leaflet(s). Mild mitral annular calcification. Trivial mitral valve regurgitation. No evidence of mitral valve stenosis. Tricuspid Valve: The tricuspid valve is normal in structure. Tricuspid valve regurgitation is not demonstrated. No evidence of tricuspid stenosis. Aortic Valve: The aortic valve was not well visualized. Aortic valve regurgitation is not visualized. Mild to moderate aortic valve  sclerosis/calcification is present, without any evidence of aortic stenosis. Pulmonic Valve: The pulmonic valve was normal in structure. Pulmonic valve regurgitation is not visualized. No evidence of pulmonic stenosis. Aorta: The aortic root is normal in size and structure. Venous: The inferior vena cava is normal in size with greater than 50% respiratory variability, suggesting right atrial pressure of 3 mmHg. IAS/Shunts: No atrial level shunt detected by color flow Doppler. Additional Comments: ? patient in afib during exam.  LEFT VENTRICLE PLAX 2D LVIDd:         3.88 cm LVIDs:         2.81 cm LV PW:         1.17 cm LV IVS:        0.63 cm  RIGHT VENTRICLE          IVC RV Basal diam:  2.68 cm  IVC diam: 1.92 cm TAPSE (M-mode): 1.9 cm LEFT ATRIUM             Index       RIGHT ATRIUM           Index LA diam:        4.10 cm 2.11 cm/m  RA Area:     16.50 cm LA Vol (A2C):   69.5 ml 35.77 ml/m RA Volume:   44.20 ml  22.75 ml/m LA  Vol (A4C):   36.6 ml 18.84 ml/m LA Biplane Vol: 53.5 ml 27.53 ml/m  AORTIC VALVE LVOT Vmax:   90.55 cm/s LVOT Vmean:  64.100 cm/s LVOT VTI:    0.160 m  AORTA Ao Root diam: 4.10 cm MITRAL VALVE MV Area (PHT): 3.05 cm    SHUNTS MV Decel Time: 249 msec    Systemic VTI: 0.16 m MV E velocity: 92.20 cm/s Jenkins Rouge MD Electronically signed by Jenkins Rouge MD Signature Date/Time: 12/04/2019/10:18:03 AM    Final

## 2019-12-04 NOTE — Telephone Encounter (Signed)
Wife called in wanting to make [facility] aware that pt is in the hosp.

## 2019-12-04 NOTE — Progress Notes (Signed)
Spoke with patient wife Ginger, provided updates as requested

## 2019-12-04 NOTE — Progress Notes (Signed)
Transitions of Care Pharmacist Note  John Woodward. is a 74 y.o. male that has been diagnosed with A Fib and will be prescribed Eliquis (apixaban) at discharge.   Patient Education: I provided the following education on apixaban on 12/04/19 to the patient: How to take the medication Described what the medication is Signs of bleeding Signs/symptoms of VTE and stroke  Answered their questions  Discharge Medications Plan: The patient wants to have their discharge medications filled by the Transitions of Care pharmacy rather than their usual pharmacy.  The discharge orders pharmacy has been changed to the Transitions of Care pharmacy, the patient will receive a phone call regarding co-pay, and their medications will be delivered by the Transitions of Care pharmacy.    Thank you,   Shauna Hugh, PharmD, [place]  PGY-1 Pharmacy Resident 12/04/2019 5:29 PM

## 2019-12-04 NOTE — Progress Notes (Signed)
   12/03/19 2119  Assess: MEWS Score  Temp 99.2 F (37.3 C)  BP (!) 140/119  Pulse Rate (!) 111  ECG Heart Rate (!) 108  Resp (!) 25  Level of Consciousness Alert  SpO2 95 %  O2 Device Room Air  Assess: if the MEWS score is Yellow or Red  Were vital signs taken at a resting state? Yes  Focused Assessment No change from prior assessment  Early Detection of Sepsis Score *See Row Information* Low  MEWS guidelines implemented *See Row Information* Yes (pt received to unit Yellow)  Treat  MEWS Interventions Administered scheduled meds/treatments  Pain Scale 0-10  Pain Score 0  Take Vital Signs  Increase Vital Sign Frequency  Yellow: Q 2hr X 2 then Q 4hr X 2, if remains yellow, continue Q 4hrs  Escalate  MEWS: Escalate Yellow: discuss with charge nurse/RN and consider discussing with provider and RRT  Notify: Charge Nurse/RN  Name of Charge Nurse/RN Notified [place]  Date Charge Nurse/RN Notified 12/03/19  Time Charge Nurse/RN Notified 2120  Document  Patient Outcome Stabilized after interventions

## 2019-12-04 NOTE — Progress Notes (Signed)
[place] for Heparin Indication: atrial fibrillation  Allergies  Allergen Reactions  . Antibacterial Hand Soap [Triclosan] Other (See Comments)    Caused water blisters    Patient Measurements: Height: 5\' 11"  (180.3 cm) Weight: 74.8 kg (165 lb) IBW/kg (Calculated) : 75.3 Heparin Dosing Weight: 74.8 kg  Vital Signs: Temp: 100 F (37.8 C) (11/08 0001) Temp Source: Oral (11/08 0001) BP: 133/72 (11/08 0001) Pulse Rate: 94 (11/08 0001)  Labs: Recent Labs    12/03/19 1228 12/04/19 0329  HGB 16.1 15.4  HCT 48.2 44.4  PLT 142* 143*  HEPARINUNFRC  --  0.53  CREATININE 1.83*  --   TROPONINIHS 71*  --     Estimated Creatinine Clearance: 38 mL/min (A) (by C-G formula based on SCr of 1.83 mg/dL (H)).   Medical History: Past Medical History:  Diagnosis Date  . Arthritis   . Closed fracture dislocation of right elbow    30 years ago -- fell out of theback of a truck  . Diabetes mellitus without complication ([place])   . Hx of colonic polyps 02/23/2017  . Hyperlipidemia   . Hypertension    Diagnosed 10 years ago. No on medication. Denies history of CAD.     Medications:  Scheduled:  . aspirin EC  162 mg Oral QHS  . insulin aspart  0-9 Units Subcutaneous TID WC  . metoprolol tartrate  12.5 mg Oral BID  . rosuvastatin  10 mg Oral Q M,W,F    Assessment: Patient is a 65 yom that presents to the ED in Afib with RVR. The patient is not on any blood thinners at home pta. Pharmacy has been asked to dose heparin at this time for Afib.   11/8 AM update:  Heparin level therapeutic   Goal of Therapy:  Heparin level 0.3-0.7 units/ml Monitor platelets by anticoagulation protocol: Yes   Plan:  Cont heparin 1100 units/hr 1200 heparin level  Narda Bonds, PharmD, BCPS Clinical Pharmacist Phone: [phone]

## 2019-12-05 ENCOUNTER — Inpatient Hospital Stay (HOSPITAL_COMMUNITY): Payer: Medicare HMO

## 2019-12-05 ENCOUNTER — Other Ambulatory Visit (HOSPITAL_COMMUNITY): Payer: Self-pay | Admitting: Internal Medicine

## 2019-12-05 LAB — BRAIN NATRIURETIC PEPTIDE: B Natriuretic Peptide: 270.8 pg/mL — ABNORMAL HIGH (ref 0.0–100.0)

## 2019-12-05 LAB — CBC WITH DIFFERENTIAL/PLATELET
Abs Immature Granulocytes: 0.01 10*3/uL (ref 0.00–0.07)
Basophils Absolute: 0 10*3/uL (ref 0.0–0.1)
Basophils Relative: 0 %
Eosinophils Absolute: 0 10*3/uL (ref 0.0–0.5)
Eosinophils Relative: 0 %
HCT: 48.9 % (ref 39.0–52.0)
Hemoglobin: 16.3 g/dL (ref 13.0–17.0)
Immature Granulocytes: 0 %
Lymphocytes Relative: 31 %
Lymphs Abs: 1.1 10*3/uL (ref 0.7–4.0)
MCH: 31.5 pg (ref 26.0–34.0)
MCHC: 33.3 g/dL (ref 30.0–36.0)
MCV: 94.6 fL (ref 80.0–100.0)
Monocytes Absolute: 0.3 10*3/uL (ref 0.1–1.0)
Monocytes Relative: 10 %
Neutro Abs: 2.1 10*3/uL (ref 1.7–7.7)
Neutrophils Relative %: 59 %
Platelets: 137 10*3/uL — ABNORMAL LOW (ref 150–400)
RBC: 5.17 MIL/uL (ref 4.22–5.81)
RDW: 13.1 % (ref 11.5–15.5)
WBC: 3.5 10*3/uL — ABNORMAL LOW (ref 4.0–10.5)
nRBC: 0 % (ref 0.0–0.2)

## 2019-12-05 LAB — GLUCOSE, CAPILLARY
Glucose-Capillary: 109 mg/dL — ABNORMAL HIGH (ref 70–99)
Glucose-Capillary: 122 mg/dL — ABNORMAL HIGH (ref 70–99)
Glucose-Capillary: 168 mg/dL — ABNORMAL HIGH (ref 70–99)

## 2019-12-05 LAB — D-DIMER, QUANTITATIVE: D-Dimer, Quant: 1.2 ug/mL-FEU — ABNORMAL HIGH (ref 0.00–0.50)

## 2019-12-05 LAB — COMPREHENSIVE METABOLIC PANEL
ALT: 22 U/L (ref 0–44)
AST: 27 U/L (ref 15–41)
Albumin: 3 g/dL — ABNORMAL LOW (ref 3.5–5.0)
Alkaline Phosphatase: 55 U/L (ref 38–126)
Anion gap: 11 (ref 5–15)
BUN: 31 mg/dL — ABNORMAL HIGH (ref 8–23)
CO2: 24 mmol/L (ref 22–32)
Calcium: 8.5 mg/dL — ABNORMAL LOW (ref 8.9–10.3)
Chloride: 102 mmol/L (ref 98–111)
Creatinine, Ser: 1.61 mg/dL — ABNORMAL HIGH (ref 0.61–1.24)
GFR, Estimated: 45 mL/min — ABNORMAL LOW (ref >60)
Glucose, Bld: 105 mg/dL — ABNORMAL HIGH (ref 70–99)
Potassium: 5.1 mmol/L (ref 3.5–5.1)
Sodium: 137 mmol/L (ref 135–145)
Total Bilirubin: 1 mg/dL (ref 0.3–1.2)
Total Protein: 6.3 g/dL — ABNORMAL LOW (ref 6.5–8.1)

## 2019-12-05 LAB — C-REACTIVE PROTEIN: CRP: 5 mg/dL — ABNORMAL HIGH (ref <1.0)

## 2019-12-05 LAB — MAGNESIUM: Magnesium: 2 mg/dL (ref 1.7–2.4)

## 2019-12-05 MED ORDER — METOPROLOL TARTRATE 50 MG PO TABS
50.0000 mg | ORAL_TABLET | Freq: Two times a day (BID) | ORAL | 0 refills | Status: DC
Start: 2019-12-05 — End: 2020-01-05

## 2019-12-05 MED ORDER — APIXABAN 5 MG PO TABS
5.0000 mg | ORAL_TABLET | Freq: Two times a day (BID) | ORAL | 0 refills | Status: DC
Start: 2019-12-05 — End: 2020-01-05

## 2019-12-05 MED ORDER — LACTATED RINGERS IV BOLUS
1000.0000 mL | Freq: Once | INTRAVENOUS | Status: AC
  Administered 2019-12-05: 1000 mL via INTRAVENOUS

## 2019-12-05 MED FILL — METOPROLOL TARTRATE 50 MG T: 50 | 30 days supply | Qty: 60 | Fill #0

## 2019-12-05 MED FILL — ELIQUIS 5 MG TABLET: 5 | 30 days supply | Qty: 60 | Fill #0

## 2019-12-05 NOTE — Evaluation (Signed)
Physical Therapy Evaluation Patient Details Name: John Woodward. MRN: [id number] DOB: 03-18-1945 Today's Date: 12/05/2019   History of Present Illness  74 yo male presenting to ED via EMS with syncopal episode (seated at table and fell forward onto table). Subsequently went into Afib RVR. Tested COVID-19 postive; vaccinated. MRI brain negative for acute changes; progression of prior small supra- and infratentorial hemorrhages. EEG normal. PMH including HTN, HLD, and DM type ll.   Clinical Impression   Pt admitted with above diagnosis. Patient independent prior to syncopal episode. Today he had a drop in blood pressure initially upon standing, and then further decreased while walking with pt reporting "knees feeling weak." Patient requested seated rest and BP slowly improved. Pt currently with functional limitations due to the deficits listed below (see PT Problem List). Pt will benefit from skilled PT to increase their independence and safety with mobility to allow discharge to the venue listed below.      12/05/19 0900  Orthostatic Lying   BP- Lying 128/76  Pulse- Lying 69  Orthostatic Sitting  BP- Sitting 125/87  Pulse- Sitting 70  Orthostatic Standing at 0 minutes  BP- Standing at 0 minutes 111/74  Pulse- Standing at 0 minutes 85  Orthostatic Standing at 3 minutes  BP- Standing at 3 minutes 123/81  Pulse- Standing at 3 minutes 81    12/05/19 0918  Vital Signs  BP 106/83  BP Location Left Arm  BP Method Automatic  Patient Position (if appropriate) Sitting (after walking)      Follow Up Recommendations No PT follow up;Supervision for mobility/OOB    Equipment Recommendations  None recommended by PT    Recommendations for Other Services       Precautions / Restrictions Precautions Precautions: Fall;Other (comment) Precaution Comments: syncope      Mobility  Bed Mobility Overal bed mobility: Needs Assistance Bed Mobility: Supine to Sit     Supine to sit:  Supervision     General bed mobility comments: Supervision for safety due to feeling weak    Transfers Overall transfer level: Needs assistance Equipment used: None Transfers: Sit to/from Stand Sit to Stand: Min guard         General transfer comment: Min Guard A for safety; "knees feel weak"  Ambulation/Gait Ambulation/Gait assistance: Min assist Gait Distance (Feet): 44 Feet (seated rest; 17; seated ADLs; 17 ft) Assistive device: 1 person hand held assist;None Gait Pattern/deviations: Step-through pattern;Decreased stride length;Narrow base of support Gait velocity: decr   General Gait Details: reports feeling unsteady and knees beginning to feel weaker (sat and again assessed BP with drop when walking)  Science writer    Modified Rankin (Stroke Patients Only)       Balance Overall balance assessment: Needs assistance Sitting-balance support: No upper extremity supported;Feet supported Sitting balance-Leahy Scale: Good     Standing balance support: No upper extremity supported;During functional activity Standing balance-Leahy Scale: Fair Standing balance comment: reaching for UE support with dynamic challenges                             Pertinent Vitals/Pain Pain Assessment: No/denies pain    Home Living Family/patient expects to be discharged to:: Private residence Living Arrangements: Spouse/significant other;Children (1 adult son still at home (working)) Available Help at Discharge: Family Type of Home: House Home Access: Stairs to enter Entrance Stairs-Rails: Right Entrance Stairs-Number of  Steps: 5 Home Layout: One level Home Equipment: [place] - 2 wheels;Cane - single point;Shower seat - built in (leftover equipment from parents) Additional Comments: has a handicap bathroom, but not the one he normally uses    Prior Function Level of Independence: Independent         Comments: still works part-time as  Oncologist   Dominant Hand: Right    Extremity/Trunk Assessment   Upper Extremity Assessment Upper Extremity Assessment: Defer to OT evaluation    Lower Extremity Assessment Lower Extremity Assessment: Generalized weakness    Cervical / Trunk Assessment Cervical / Trunk Assessment: Normal  Communication   Communication: HOH  Cognition Arousal/Alertness: Awake/alert Behavior During Therapy: WFL for tasks assessed/performed Overall Cognitive Status: Within Functional Limits for tasks assessed                                        General Comments General comments (skin integrity, edema, etc.): SpO2 100% on RA. HR 60-80    Exercises     Assessment/Plan    PT Assessment Patient needs continued PT services  PT Problem List Decreased strength;Decreased activity tolerance;Decreased balance;Decreased mobility;Decreased knowledge of use of DME;Cardiopulmonary status limiting activity       PT Treatment Interventions DME instruction;Gait training;Functional mobility training;Therapeutic activities;Therapeutic exercise;Balance training;Patient/family education    PT Goals (Current goals can be found in the Care Plan section)  Acute Rehab PT Goals Patient Stated Goal: Go home soon PT Goal Formulation: With patient Time For Goal Achievement: 12/19/19 Potential to Achieve Goals: Good    Frequency Min 3X/week   Barriers to discharge        Co-evaluation PT/OT/SLP Co-Evaluation/Treatment: Yes Reason for Co-Treatment: For patient/therapist safety;To address functional/ADL transfers PT goals addressed during session: Mobility/safety with mobility;Balance OT goals addressed during session: ADL's and self-care       AM-PAC PT "6 Clicks" Mobility  Outcome Measure Help needed turning from your back to your side while in a flat bed without using bedrails?: None Help needed moving from lying on your back to sitting on the side of a  flat bed without using bedrails?: A Little Help needed moving to and from a bed to a chair (including a wheelchair)?: A Little Help needed standing up from a chair using your arms (e.g., wheelchair or bedside chair)?: A Little Help needed to walk in hospital room?: A Little Help needed climbing 3-5 steps with a railing? : A Little 6 Click Score: 19    End of Session   Activity Tolerance: Treatment limited secondary to medical complications (Comment) (orthostasis, dizziness) Patient left: in chair;with call bell/phone within reach;with chair alarm set Nurse Communication: Mobility status;Other (comment) (orthostasis) PT Visit Diagnosis: Unsteadiness on feet (R26.81);Muscle weakness (generalized) (M62.81)    Time: 9163-8466 PT Time Calculation (min) (ACUTE ONLY): 37 min   Charges:   PT Evaluation $PT Eval Moderate Complexity: 1 Mod           Arby Barrette, PT Pager [phone]   Rexanne Mano 12/05/2019, 12:41 PM

## 2019-12-05 NOTE — Evaluation (Signed)
Occupational Therapy Evaluation Patient Details Name: John Woodward. MRN: [id number] DOB: 1945/10/11 Today's Date: 12/05/2019    History of Present Illness 74 yo male presenting to ED via EMS with syncopal episode (seated at table and fell forward onto table). Subsequently went into Afib RVR. Tested COVID-19 postive; vaccinated. MRI brain negative for acute changes; progression of prior small supra- and infratentorial hemorrhages. EEG normal. PMH including HTN, HLD, and DM type ll.    Clinical Impression   PTA, pt was living with his wife and son and was independent and working part-time. Currently, pt requires Supervision-Min Guard A for ADLs and functional mobility. Pt reporting weakness and feeling unsteady with mobility and also noting decreased BP with movement.  Feel pt is close to baseline for functional performance for BADLs; however, presenting with decreased activity tolerance. SpO2 >90% on RA. Pt would benefit from further acute OT to facilitate safe dc. Recommend dc to home once medically stable per physician.   Orthostatic BPs   Supine 128/76   Sitting at EOB 125/87  Standing 111/74  Standing at 3 minutes 123/81      Follow Up Recommendations  No OT follow up;Supervision - Intermittent    Equipment Recommendations  None recommended by OT    Recommendations for Other Services PT consult     Precautions / Restrictions Precautions Precautions: Fall;Other (comment) Precaution Comments: syncope      Mobility Bed Mobility Overal bed mobility: Needs Assistance Bed Mobility: Supine to Sit     Supine to sit: Supervision     General bed mobility comments: Supervision for safety    Transfers Overall transfer level: Needs assistance Equipment used: None Transfers: Sit to/from Stand Sit to Stand: Min guard         General transfer comment: Min Guard A for safety; "knees feel weak"    Balance Overall balance assessment: Needs assistance Sitting-balance  support: No upper extremity supported;Feet supported Sitting balance-Leahy Scale: Good     Standing balance support: No upper extremity supported;During functional activity Standing balance-Leahy Scale: Fair Standing balance comment: reaching for UE support with dynamic challenges                           ADL either performed or assessed with clinical judgement   ADL Overall ADL's : Needs assistance/impaired Eating/Feeding: Set up;Sitting   Grooming: Oral care;Set up;Sitting (shaving)   Upper Body Bathing: Set up;Sitting;Supervision/ safety   Lower Body Bathing: Min guard;Sit to/from stand   Upper Body Dressing : Supervision/safety;Set up;Sitting   Lower Body Dressing: Min guard;Sit to/from stand   Toilet Transfer: Min guard;Ambulation (simulated in room)           Functional mobility during ADLs: Min guard General ADL Comments: Pt performing functional mobiltiy in room and grooming while seated at sink. Pt reporting weakness and presenting with decreased BP with activity.      Vision Baseline Vision/History: Wears glasses Wears Glasses: At all times Patient Visual Report: No change from baseline       Perception     Praxis      Pertinent Vitals/Pain Pain Assessment: No/denies pain     Hand Dominance Right   Extremity/Trunk Assessment Upper Extremity Assessment Upper Extremity Assessment: Overall WFL for tasks assessed   Lower Extremity Assessment Lower Extremity Assessment: Defer to PT evaluation   Cervical / Trunk Assessment Cervical / Trunk Assessment: Normal   Communication Communication Communication: HOH   Cognition Arousal/Alertness: Awake/alert Behavior  During Therapy: WFL for tasks assessed/performed Overall Cognitive Status: Within Functional Limits for tasks assessed                                     General Comments  SpO2 100% on RA. HR 60-80    Exercises     Shoulder Instructions      Home Living  Family/patient expects to be discharged to:: Private residence Living Arrangements: Spouse/significant other;Children (1 adult son still at home (working)) Available Help at Discharge: Family Type of Home: House Home Access: Stairs to enter Technical brewer of Steps: 5 Entrance Stairs-Rails: Right [place]: One level     Bathroom Shower/Tub: Occupational psychologist: Standard Bathroom Accessibility: Yes   Home Equipment: Environmental consultant - 2 wheels;Cane - single point;Shower seat - built in (leftover equipment from parents)   Additional Comments: has a handicap bathroom, but not the one he normally uses      Prior Functioning/Environment Level of Independence: Independent        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 Problem List: Decreased activity tolerance;Decreased knowledge of precautions      OT Treatment/Interventions: Self-care/ADL training;Therapeutic exercise;Energy conservation;DME and/or AE instruction;Therapeutic activities;Patient/family education    OT Goals(Current goals can be found in the care plan section) Acute Rehab OT Goals Patient Stated Goal: Go home soon OT Goal Formulation: With patient Time For Goal Achievement: 12/19/19 Potential to Achieve Goals: Good  OT Frequency: Min 2X/week   Barriers to D/C:            Co-evaluation PT/OT/SLP Co-Evaluation/Treatment: Yes Reason for Co-Treatment: For patient/therapist safety;To address functional/ADL transfers   OT goals addressed during session: ADL's and self-care      AM-PAC OT "6 Clicks" Daily Activity     Outcome Measure Help from another person eating meals?: None Help from another person taking care of personal grooming?: A Little Help from another person toileting, which includes using toliet, bedpan, or urinal?: A Little Help from another person bathing (including washing, rinsing, drying)?: A Little Help from another person to put on and taking off  regular upper body clothing?: None Help from another person to put on and taking off regular lower body clothing?: A Little 6 Click Score: 20   End of Session Nurse Communication: Mobility status  Activity Tolerance: Patient tolerated treatment well Patient left: in chair;with call bell/phone within reach;with chair alarm set  OT Visit Diagnosis: Unsteadiness on feet (R26.81);Other abnormalities of gait and mobility (R26.89);Muscle weakness (generalized) (M62.81)                Time: 4917-9150 OT Time Calculation (min): 37 min Charges:  OT General Charges $OT Visit: 1 Visit OT Evaluation $OT Eval Low Complexity: [place], OTR/L Acute Rehab Pager: [phone] Office: [place] 12/05/2019, 12:08 PM

## 2019-12-05 NOTE — Discharge Summary (Signed)
John Woodward. HUT:[id number] DOB: November 26, 1945 DOA: 12/03/2019  PCP: Brunetta Jeans, PA-C  Admit date: 12/03/2019  Discharge date: 12/05/2019  Admitted From: Home  Disposition:  Home   Recommendations for Outpatient Follow-up:   Follow up with PCP in 1-2 weeks  PCP Please obtain BMP/CBC, 2 view CXR in 1week,  (see Discharge instructions)   PCP Please follow up on the following pending results: Monitor blood pressure, CBC, CMP and a two-view chest x-ray in 7 to 10 days.   Home Health: None Equipment/Devices: None  Consultations: None  Discharge Condition: Stable    CODE STATUS: Full    Diet Recommendation: Heart Healthy Low Carb  Diet Order            Diet heart healthy/carb modified Room service appropriate? Yes; Fluid consistency: Thin  Diet effective now                  Chief Complaint  Patient presents with  . Near Syncope     Brief history of present illness from the day of admission and additional interim summary    John Woodwardis a 73 y.o.malewith medical history significant ofHTN, HLD, IIDM,presented with syncope episode. This happened this morning,patient was sitting next to the kitchen table suddenly fell forward on the table topand became unresponsive for about 2 to 3 minutes. Wife at bedside witnessed and reported patient appeared cold clammy and pale,and breathing rapidly, was brought to the ER by EMS.  In the ER initially his heart rate and vital signs were stable, he subsequently went into A. fib RVR and was also found to have incidental COVID-19 infection.  Of note he is vaccinated.                                                                 Hospital Course   1. Incidental COVID-19 infection in a fully vaccinated patient.  No symptoms - appropriately has been  treated with antibody infusion, remains symptom-free.Marland Kitchen   SpO2: 98 %  Recent Labs  Lab 12/03/19 1228 12/03/19 1427 12/04/19 0329 12/05/19 0127  WBC 4.6  --  4.9 3.5*  CRP 1.7*  --  2.7* 5.0*  DDIMER 1.48*  --  1.07* 1.20*  BNP 117.0*  --  190.3* 270.8*  PROCALCITON 0.22  --   --   --   AST 25  --  22 27  ALT 17  --  18 22  ALKPHOS 54  --  55 55  BILITOT 1.0  --  0.7 1.0  ALBUMIN 3.2*  --  3.1* 3.0*  SARSCOV2NAA  --  POSITIVE*  --   --       2. Syncope - unclear reason, could be due to dehydration as he had AKI, initially when he came to  the ER he was in sinus rhythm, preceding to syncope he had no symptoms and this happened while he was sitting on a stool.  It seemed that his syncope most likely was induced by dehydration which also in turn caused A. fib, MRI brain nonacute although changes suggestive of some hypertensive injury in the past, EEG stable, no focal deficits, ambulated in the hallway, will be discharged home however his blood pressure medications have been adjusted ARB and diuretics have been discontinued.  Follow with PCP in a week along with cardiology in a week outpatient.   3.  Proximal A. fib, [place] vas 2 score of at least 3.  Lacunar infarct on previous MRI brain.  Currently on Lopressor goal will be rate control, TSH is stable, echo nonacute, has been placed on Eliquis.  Post discharged follow-up with cardiology outpatient.  Will be discharged on Lopressor along with Eliquis.  4.  AKI.  The ATN caused by dehydration, was also on diuretics and ARB, offending medications held hydrated renal function improved PCP to repeat in 7 to 10 days.  5.  Early cognitive impairment with remote history of lacunar CVA on MRI.  Supportive care.  If he is on Eliquis will stop aspirin.  6..  On PPI.  7.  Mild increase in troponin and non-ACS pattern likely demand ischemia from RVR, chest pain-free, EKG nonspecific, stable echocardiogram preserved EF and no wall motion  abnormality.  Continue beta-blocker and statin for secondary prevention, aspirin being switched to Eliquis.  Outpatient cardiology follow-up in 7 to 10 days recommended.  8.  DM type II.  Sliding scale.  Recent A1c 6.3.  Continue home regimen.    Discharge diagnosis     Active Problems:   A-fib [facility])    Discharge instructions    Discharge Instructions    Amb referral to AFIB Clinic   Complete by: As directed    Discharge instructions   Complete by: As directed    Follow with Primary MD Brunetta Jeans, PA-C in 7 days   Get CBC, CMP, 2 view Chest X ray -  checked next visit within 1 week by Primary MD  Activity: As tolerated with Full fall precautions use walker/cane & assistance as needed  Disposition Home   Diet: Heart Healthy Low Carb  Special Instructions: If you have smoked or chewed Tobacco  in the last 2 yrs please stop smoking, stop any regular Alcohol  and or any Recreational drug use.  On your next visit with your primary care physician please Get Medicines reviewed and adjusted.  Please request your Prim.MD to go over all Hospital Tests and Procedure/Radiological results at the follow up, please get all Hospital records sent to your Prim MD by signing hospital release before you go home.  If you experience worsening of your admission symptoms, develop shortness of breath, life threatening emergency, suicidal or homicidal thoughts you must seek medical attention immediately by calling 911 or calling your MD immediately  if symptoms less severe.  You Must read complete instructions/literature along with all the possible adverse reactions/side effects for all the Medicines you take and that have been prescribed to you. Take any new Medicines after you have completely understood and accpet all the possible adverse reactions/side effects.   Increase activity slowly   Complete by: As directed       Discharge Medications   Allergies as of 12/05/2019      Reactions    Antibacterial Hand Soap [triclosan] Other (See Comments)  Caused water blisters      Medication List    STOP taking these medications   Bayer Low Dose 81 MG EC tablet Generic drug: aspirin   hydrochlorothiazide 25 MG tablet Commonly known as: HYDRODIURIL   ibuprofen 200 MG tablet Commonly known as: ADVIL   losartan 100 MG tablet Commonly known as: COZAAR   potassium chloride SA 20 MEQ tablet Commonly known as: KLOR-CON     TAKE these medications   acetaminophen 500 MG tablet Commonly known as: TYLENOL Take 500-1,000 mg by mouth every 6 (six) hours as needed for mild pain (or headaches).   apixaban 5 MG Tabs tablet Commonly known as: ELIQUIS Take 1 tablet (5 mg total) by mouth 2 (two) times daily.   metFORMIN 500 MG tablet Commonly known as: GLUCOPHAGE TAKE 1 TABLET BY MOUTH DAILY IN THE MORNING AND 2 TABLETS DAILY IN THE EVENING What changed:   how much to take  how to take this  when to take this  additional instructions   metoprolol tartrate 50 MG tablet Commonly known as: LOPRESSOR Take 1 tablet (50 mg total) by mouth 2 (two) times daily.   nicotine polacrilex 4 MG lozenge Commonly known as: COMMIT Take 4 mg by mouth as needed for smoking cessation.   rosuvastatin 10 MG tablet Commonly known as: CRESTOR Take 1 tablet on Mon, Wed, Fri What changed:   how much to take  how to take this  when to take this        Follow-up Information    Brunetta Jeans, PA-C. Schedule an appointment as soon as possible for a visit in 1 week(s).   Specialty: Family Medicine Contact information: [address] [place] [address] [phone]        Adrian Prows, MD. Schedule an appointment as soon as possible for a visit in 1 week(s).   Specialty: Cardiology Why: Afib Contact information: [place] [address] [phone]               Major procedures and Radiology Reports - PLEASE review detailed and  final reports thoroughly  -       DG Chest 1 View  Result Date: 12/04/2019 CLINICAL DATA:  Congestive heart failure. COVID positive. EXAM: CHEST  1 VIEW COMPARISON:  12/03/2019 chest x-ray. FINDINGS: The cardiac silhouette, mediastinal and hilar contours are within normal limits and stable. The lungs are clear of an acute process. No pleural effusions or pulmonary lesions. IMPRESSION: No acute cardiopulmonary findings. Electronically Signed   By: Marijo Sanes M.D.   On: 12/04/2019 07:56   MR BRAIN WO CONTRAST  Result Date: 12/04/2019 CLINICAL DATA:  Syncope. EXAM: MRI HEAD WITHOUT CONTRAST TECHNIQUE: Multiplanar, multiecho pulse sequences of the brain and surrounding structures were obtained without intravenous contrast. COMPARISON:  MRI 09/14/2018. FINDINGS: Brain: No acute infarction, acute hemorrhage, hydrocephalus, extra-axial collection or mass lesion. There is extensive T2/FLAIR hyperintensity throughout the white matter, compatible with chronic microvascular ischemic disease. Numerous dilated perivascular spaces and/or chronic lacunar infarcts in bilateral basal ganglia. There are multiple remote lacunar infarcts in bilateral thalami. Mild cerebral atrophy. There are numerous small foci of susceptibility artifact, greatest in number in the cerebellum, pons, and thalami which are compatible with prior hemorrhages. There are multiple new foci of susceptibility artifact infratentorial and supratentorially in comparison to 2020, compatible with interval microhemorrhages. Vascular: Major arterial flow voids are maintained at the skull base. Skull and upper cervical spine: Normal marrow  signal. Sinuses/Orbits: Mucosal thickening throughout the sinuses. Unremarkable orbits. Other: Moderate right mastoid effusion. IMPRESSION: 1. No evidence of acute intracranial abnormality. 2. Progression of numerous small supratentorial and infratentorial prior hemorrhages, which are in a distribution suggestive of  hypertensive hemorrhage. 3. Similar severe chronic microvascular ischemic disease and remote lacunar infarcts. Electronically Signed   By: Margaretha Sheffield MD   On: 12/04/2019 14:13   DG Chest Port 1 View  Result Date: 12/05/2019 CLINICAL DATA:  COVID.  Congestive heart failure EXAM: PORTABLE CHEST 1 VIEW COMPARISON:  12/04/2019 FINDINGS: Normal heart size. Aortic tortuosity. Artifact from bilateral skin fold. No visible pneumothorax. The lungs are clear. IMPRESSION: No evidence of active disease. Electronically Signed   By: Monte Fantasia M.D.   On: 12/05/2019 08:04   DG Chest Port 1 View  Result Date: 12/03/2019 CLINICAL DATA:  Dyspnea EXAM: PORTABLE CHEST 1 VIEW COMPARISON:  None. FINDINGS: Normal heart size. Mildly tortuous atherosclerotic thoracic aorta. Otherwise normal mediastinal contour. No pneumothorax. No pleural effusion. Lungs appear clear, with no acute consolidative airspace disease and no pulmonary edema. IMPRESSION: No active disease. Electronically Signed   By: Ilona Sorrel M.D.   On: 12/03/2019 13:45   EEG adult  Result Date: 12/04/2019 Lora Havens, MD     12/04/2019  4:17 PM Patient Name: John Woodward. MRN: [id number] Epilepsy Attending: Lora Havens Referring Physician/Provider: Dr Lala Lund Date: 12/04/2019 Duration: 26.33 mins Patient history: 74 year old male with syncope.  EEG to evaluate for seizures. Level of alertness: Awake, asleep AEDs during EEG study: None Technical aspects: This EEG study was done with scalp electrodes positioned according to the 10-20 International system of electrode placement. Electrical activity was acquired at a sampling rate of 500Hz  and reviewed with a high frequency filter of 70Hz  and a low frequency filter of 1Hz . EEG data were recorded continuously and digitally stored. Description: The posterior dominant rhythm consists of 9-10 Hz activity of moderate voltage (25-35 uV) seen predominantly in posterior head regions, symmetric  and reactive to eye opening and eye closing. Sleep was characterized by vertex waves, sleep spindles (12 to 14 Hz), maximal frontocentral region.    Hyperventilation and photic stimulation were not performed.   IMPRESSION: This study is within normal limits. No seizures or epileptiform discharges were seen throughout the recording. Lora Havens   ECHOCARDIOGRAM COMPLETE  Result Date: 12/04/2019    ECHOCARDIOGRAM REPORT   Patient Name:   John Woodward. Date of Exam: 12/04/2019 Medical Rec #:  [id number]           Height:       71.0 in Accession #:    [id number]          Weight:       165.0 lb Date of Birth:  07-24-1945           BSA:          1.943 m Patient Age:    31 years            BP:           105/68 mmHg Patient Gender: M                   HR:           93 bpm. Exam Location:  Inpatient Procedure: 2D Echo, Cardiac Doppler and Color Doppler Indications:    R55 Syncope  History:        Patient has prior history of  Echocardiogram examinations, most                 recent 03/24/2018. Risk Factors:Hypertension, Diabetes and                 Dyslipidemia. COVID-19 Positive.  Sonographer:    Tiffany Dance Referring Phys: [id number] [place]  1. ? patient in afib during exam.  2. Left ventricular ejection fraction, by estimation, is 60 to 65%. The left ventricle has normal function. The left ventricle has no regional wall motion abnormalities. Left ventricular diastolic parameters are indeterminate.  3. Right ventricular systolic function is normal. The right ventricular size is normal.  4. Left atrial size was mildly dilated.  5. The mitral valve is degenerative. Trivial mitral valve regurgitation. No evidence of mitral stenosis.  6. The aortic valve was not well visualized. Aortic valve regurgitation is not visualized. Mild to moderate aortic valve sclerosis/calcification is present, without any evidence of aortic stenosis.  7. The inferior vena cava is normal in size with greater than 50%  respiratory variability, suggesting right atrial pressure of 3 mmHg. FINDINGS  Left Ventricle: Left ventricular ejection fraction, by estimation, is 60 to 65%. The left ventricle has normal function. The left ventricle has no regional wall motion abnormalities. The left ventricular internal cavity size was normal in size. There is  no left ventricular hypertrophy. Left ventricular diastolic parameters are indeterminate. Right Ventricle: The right ventricular size is normal. No increase in right ventricular wall thickness. Right ventricular systolic function is normal. Left Atrium: Left atrial size was mildly dilated. Right Atrium: Right atrial size was normal in size. Pericardium: There is no evidence of pericardial effusion. Mitral Valve: The mitral valve is degenerative in appearance. There is mild thickening of the mitral valve leaflet(s). There is mild calcification of the mitral valve leaflet(s). Mild mitral annular calcification. Trivial mitral valve regurgitation. No evidence of mitral valve stenosis. Tricuspid Valve: The tricuspid valve is normal in structure. Tricuspid valve regurgitation is not demonstrated. No evidence of tricuspid stenosis. Aortic Valve: The aortic valve was not well visualized. Aortic valve regurgitation is not visualized. Mild to moderate aortic valve sclerosis/calcification is present, without any evidence of aortic stenosis. Pulmonic Valve: The pulmonic valve was normal in structure. Pulmonic valve regurgitation is not visualized. No evidence of pulmonic stenosis. Aorta: The aortic root is normal in size and structure. Venous: The inferior vena cava is normal in size with greater than 50% respiratory variability, suggesting right atrial pressure of 3 mmHg. IAS/Shunts: No atrial level shunt detected by color flow Doppler. Additional Comments: ? patient in afib during exam.  LEFT VENTRICLE PLAX 2D LVIDd:         3.88 cm LVIDs:         2.81 cm LV PW:         1.17 cm LV IVS:        0.63  cm  RIGHT VENTRICLE          IVC RV Basal diam:  2.68 cm  IVC diam: 1.92 cm TAPSE (M-mode): 1.9 cm LEFT ATRIUM             Index       RIGHT ATRIUM           Index LA diam:        4.10 cm 2.11 cm/m  RA Area:     16.50 cm LA Vol (A2C):   69.5 ml 35.77 ml/m RA Volume:   44.20 ml  22.75  ml/m LA Vol (A4C):   36.6 ml 18.84 ml/m LA Biplane Vol: 53.5 ml 27.53 ml/m  AORTIC VALVE LVOT Vmax:   90.55 cm/s LVOT Vmean:  64.100 cm/s LVOT VTI:    0.160 m  AORTA Ao Root diam: 4.10 cm MITRAL VALVE MV Area (PHT): 3.05 cm    SHUNTS MV Decel Time: 249 msec    Systemic VTI: 0.16 m MV E velocity: 92.20 cm/s Jenkins Rouge MD Electronically signed by Jenkins Rouge MD Signature Date/Time: 12/04/2019/10:18:03 AM    Final     Micro Results     Recent Results (from the past 240 hour(s))  Respiratory Panel by RT PCR (Flu A&B, Covid) - Nasopharyngeal Swab     Status: Abnormal   Collection Time: 12/03/19  2:27 PM   Specimen: Nasopharyngeal Swab  Result Value Ref Range Status   SARS Coronavirus 2 by RT PCR POSITIVE (A) NEGATIVE Final    Comment: RESULT CALLED TO, READ BACK BY AND VERIFIED WITH: RN A STRAYTER [phone] (NOTE) SARS-CoV-2 target nucleic acids are DETECTED.  SARS-CoV-2 RNA is generally detectable in upper respiratory specimens  during the acute phase of infection. Positive results are indicative of the presence of the identified virus, but do not rule out bacterial infection or co-infection with other pathogens not detected by the test. Clinical correlation with patient history and other diagnostic information is necessary to determine patient infection status. The expected result is Negative.  Fact Sheet for Patients:  PinkCheek.be  Fact Sheet for Healthcare Providers: GravelBags.it  This test is not yet approved or cleared by the [place] FDA and  has been authorized for detection and/or diagnosis of SARS-CoV-2 by FDA under an  Emergency Use Authorization (EUA).  This EUA will remain in effect (meaning this test can be used)  for the duration of  the COVID-19 declaration under Section 564(b)(1) of the Act, 21 U.S.C. section 360bbb-3(b)(1), unless the authorization is terminated or revoked sooner.      Influenza A by PCR NEGATIVE NEGATIVE Final   Influenza B by PCR NEGATIVE NEGATIVE Final    Comment: (NOTE) The Xpert Xpress SARS-CoV-2/FLU/RSV assay is intended as an aid in  the diagnosis of influenza from Nasopharyngeal swab specimens and  should not be used as a sole basis for treatment. Nasal washings and  aspirates are unacceptable for Xpert Xpress SARS-CoV-2/FLU/RSV  testing.  Fact Sheet for Patients: PinkCheek.be  Fact Sheet for Healthcare Providers: GravelBags.it  This test is not yet approved or cleared by the [place] FDA and  has been authorized for detection and/or diagnosis of SARS-CoV-2 by  FDA under an Emergency Use Authorization (EUA). This EUA will remain  in effect (meaning this test can be used) for the duration of the  Covid-19 declaration under Section 564(b)(1) of the Act, 21  U.S.C. section 360bbb-3(b)(1), unless the authorization is  terminated or revoked. Performed at [facility], [place] [address], [place], [place] [address]     Today   Subjective    John Woodward today has no headache,no chest abdominal pain,no new weakness tingling or numbness, feels much better wants to go home today.     Objective   Blood pressure 114/80, pulse 80, temperature 98.2 F (36.8 C), temperature source Oral, resp. rate 20, height 5\' 11"  (1.803 m), weight 74.8 kg, SpO2 98 %.   Intake/Output Summary (Last 24 hours) at 12/05/2019 1022 Last data filed at 12/05/2019 1003 Gross per 24 hour  Intake 273.56 ml  Output  375 ml  Net -101.44 ml    Exam  Awake Alert, No new F.N deficits, Normal affect [place].AT,PERRAL Supple  Neck,No JVD, No cervical lymphadenopathy appriciated.  Symmetrical Chest wall movement, Good air movement bilaterally, CTAB RRR,No Gallops,Rubs or new Murmurs, No Parasternal Heave +ve B.Sounds, Abd Soft, Non tender, No organomegaly appriciated, No rebound -guarding or rigidity. No Cyanosis, Clubbing or edema, No new Rash or bruise   Data Review   CBC w Diff:  Lab Results  Component Value Date   WBC 3.5 (L) 12/05/2019   HGB 16.3 12/05/2019   HCT 48.9 12/05/2019   PLT 137 (L) 12/05/2019   LYMPHOPCT 31 12/05/2019   MONOPCT 10 12/05/2019   EOSPCT 0 12/05/2019   BASOPCT 0 12/05/2019    CMP:  Lab Results  Component Value Date   NA 137 12/05/2019   K 5.1 12/05/2019   CL 102 12/05/2019   CO2 24 12/05/2019   BUN 31 (H) 12/05/2019   CREATININE 1.61 (H) 12/05/2019   CREATININE 1.19 (H) 06/21/2018   PROT 6.3 (L) 12/05/2019   ALBUMIN 3.0 (L) 12/05/2019   BILITOT 1.0 12/05/2019   ALKPHOS 55 12/05/2019   AST 27 12/05/2019   ALT 22 12/05/2019  .   Total Time in preparing paper work, data evaluation and todays exam - 85 minutes  Lala Lund M.D on 12/05/2019 at 10:22 AM  [facility]   Office  [phone]

## 2019-12-06 ENCOUNTER — Telehealth: Payer: Self-pay

## 2019-12-06 ENCOUNTER — Other Ambulatory Visit: Payer: Self-pay

## 2019-12-06 ENCOUNTER — Inpatient Hospital Stay: Payer: Medicare HMO | Admitting: Physician Assistant

## 2019-12-06 NOTE — Telephone Encounter (Signed)
Transition Care Management Follow-up Telephone Call  Date of discharge and from where: 12/05/19[facility]  How have you been since you were released from the hospital? Weak-Per wife  Any questions or concerns? No  Items Reviewed:  Did the pt receive and understand the discharge instructions provided? Yes   Medications obtained and verified? Yes   Other? Yes   Any new allergies since your discharge? No   Dietary orders reviewed? Yes  Do you have support at home? Yes   Home Care and Equipment/Supplies: Were home health services ordered? no If so, what is the name of the agency? n/a  Has the agency set up a time to come to the patient's home? not applicable Were any new equipment or medical supplies ordered?  No What is the name of the medical supply agency? n/a Were you able to get the supplies/equipment? not applicable Do you have any questions related to the use of the equipment or supplies? No  Functional Questionnaire: (I = Independent and D = Dependent) ADLs: I   Bathing/Dressing- I  Meal Prep- D  Eating- I  Maintaining continence- I  Transferring/Ambulation- I  Managing Meds- I-with assitance  Follow up appointments reviewed:   PCP Hospital f/u appt confirmed? Yes  Scheduled to see Raiford Noble on 12/14/19 @ 11:00.  [facility] f/u appt confirmed? No  Wife plans to call tomorrow to schedule  Are transportation arrangements needed? No   If their condition worsens, is the pt aware to call PCP or go to the Emergency Dept.? Yes  Was the patient provided with contact information for the PCP's office or ED? Yes  Was to pt encouraged to call back with questions or concerns? Yes

## 2019-12-08 ENCOUNTER — Telehealth: Payer: Self-pay | Admitting: Physician Assistant

## 2019-12-08 ENCOUNTER — Other Ambulatory Visit: Payer: Self-pay

## 2019-12-08 ENCOUNTER — Ambulatory Visit (INDEPENDENT_AMBULATORY_CARE_PROVIDER_SITE_OTHER): Payer: Medicare HMO | Admitting: Physician Assistant

## 2019-12-08 ENCOUNTER — Ambulatory Visit (HOSPITAL_BASED_OUTPATIENT_CLINIC_OR_DEPARTMENT_OTHER)
Admission: RE | Admit: 2019-12-08 | Discharge: 2019-12-08 | Disposition: A | Discharge status: Home / Self Care | Payer: Medicare HMO | Source: Ambulatory Visit | Attending: Physician Assistant | Admitting: Physician Assistant

## 2019-12-08 VITALS — BP 110/78 | HR 97 | Temp 98.2°F | Resp 14

## 2019-12-08 DIAGNOSIS — I4819 Other persistent atrial fibrillation: Secondary | ICD-10-CM | POA: Insufficient documentation

## 2019-12-08 DIAGNOSIS — R69 Illness, unspecified: Secondary | ICD-10-CM | POA: Diagnosis not present

## 2019-12-08 DIAGNOSIS — N179 Acute kidney failure, unspecified: Secondary | ICD-10-CM | POA: Diagnosis not present

## 2019-12-08 DIAGNOSIS — I4891 Unspecified atrial fibrillation: Secondary | ICD-10-CM | POA: Diagnosis not present

## 2019-12-08 DIAGNOSIS — F321 Major depressive disorder, single episode, moderate: Secondary | ICD-10-CM | POA: Diagnosis not present

## 2019-12-08 DIAGNOSIS — L03113 Cellulitis of right upper limb: Secondary | ICD-10-CM | POA: Diagnosis not present

## 2019-12-08 MED ORDER — ESCITALOPRAM OXALATE 10 MG PO TABS: 10.0000 mg | ORAL_TABLET | Freq: Every day | ORAL | 0 refills | Status: DC

## 2019-12-08 MED ORDER — DOXYCYCLINE HYCLATE 100 MG PO TABS: 100.0000 mg | ORAL_TABLET | Freq: Two times a day (BID) | ORAL | 0 refills | Status: DC

## 2019-12-08 NOTE — Telephone Encounter (Signed)
Please advise 

## 2019-12-08 NOTE — Patient Instructions (Signed)
Please go to the lab today for blood work.  I will call you with your results. We will alter treatment regimen(s) if indicated by your results.   Take the Doxycycline as directed. Continue medications per Cardiology..  Please go to [facility] for imaging. We will call you with your results and alter treatment accordingly.  [place], [place] [address]  If there is any worsening of symptoms over the weekend or recurrence of fever you must be evaluated in the ER.

## 2019-12-08 NOTE — Telephone Encounter (Signed)
Pt's wife called in stating that pt has a pus pocket about the size of a dime at the sight of her IV. He was running a low grade fever last night. She wanted to know what Einar Pheasant thinks she needs to do. Please call [phone]

## 2019-12-08 NOTE — Telephone Encounter (Signed)
Spoke with patient and wife -- have them scheduled for 11 this morning.

## 2019-12-11 ENCOUNTER — Other Ambulatory Visit: Payer: Self-pay

## 2019-12-11 ENCOUNTER — Ambulatory Visit: Payer: Medicare HMO

## 2019-12-11 ENCOUNTER — Telehealth: Payer: Self-pay

## 2019-12-11 ENCOUNTER — Telehealth: Payer: Self-pay | Admitting: Physician Assistant

## 2019-12-11 NOTE — Progress Notes (Incomplete)
Chronic Care Management Pharmacy Assistant   Name: Oskar Cretella.  MRN: [id number] DOB: [birth year]  Reason for Encounter: Medication Review  PCP : Brunetta Jeans, PA-C  Allergies:   Allergies  Allergen Reactions  . Antibacterial Hand Soap [Triclosan] Other (See Comments)    Caused water blisters    Medications: Outpatient Encounter Medications as of 12/11/2019  Medication Sig  . acetaminophen (TYLENOL) 500 MG tablet Take 500-1,000 mg by mouth every 6 (six) hours as needed for mild pain (or headaches).   Marland Kitchen apixaban (ELIQUIS) 5 MG TABS tablet Take 1 tablet (5 mg total) by mouth 2 (two) times daily.  Marland Kitchen doxycycline (VIBRA-TABS) 100 MG tablet Take 1 tablet (100 mg total) by mouth 2 (two) times daily.  Marland Kitchen escitalopram (LEXAPRO) 10 MG tablet Take 1 tablet (10 mg total) by mouth daily.  . metFORMIN (GLUCOPHAGE) 500 MG tablet TAKE 1 TABLET BY MOUTH DAILY IN THE MORNING AND 2 TABLETS DAILY IN THE EVENING (Patient taking differently: Take 500-1,000 mg by mouth See admin instructions. Take 500 mg by mouth in the morning and 1,000 mg at bedtime)  . metoprolol tartrate (LOPRESSOR) 50 MG tablet Take 1 tablet (50 mg total) by mouth 2 (two) times daily.  . nicotine polacrilex (COMMIT) 4 MG lozenge Take 4 mg by mouth as needed for smoking cessation.  . rosuvastatin (CRESTOR) 10 MG tablet Take 1 tablet on Mon, Wed, Fri (Patient taking differently: Take 10 mg by mouth every Monday, Wednesday, and Friday. Take 1 tablet on Mon, Wed, Fri)   No facility-administered encounter medications on file as of 12/11/2019.    Current Diagnosis: Patient Active Problem List   Diagnosis Date Noted  . A-fib ([place]) 12/03/2019  . Pulmonary emphysema ([place]) 09/22/2018  . Encounter for screening for malignant neoplasm of respiratory organs 02/07/2018  . Screening for AAA (abdominal aortic aneurysm) 02/07/2018  . Skin cancer screening 02/07/2018  . Need for 23-polyvalent pneumococcal polysaccharide vaccine  02/07/2018  . Encounter for immunization 02/07/2018  . Visit for preventive health examination 02/07/2018  . Hx of colonic polyps 02/23/2017  . Hyperlipidemia associated with type 2 diabetes mellitus ([place]) 10/02/2016  . Diabetes mellitus type II, uncontrolled ([place]) 09/18/2016  . Encounter for Medicare annual wellness exam 09/16/2016  . Special screening examination for viral disease 09/16/2016  . Annual physical exam 09/16/2016  . Prostate cancer screening 09/16/2016  . Tobacco abuse disorder 08/11/2016  . Essential hypertension 08/11/2016    Reviewed chart for medication changes ahead of medication coordination call.  No OVs, Consults, or hospital visits since last care coordination call/Pharmacist visit. (If appropriate, list visit date, provider name)  No medication changes indicated OR if recent visit, treatment plan here.  BP Readings from Last 3 Encounters:  12/05/19 116/71  08/24/19 (!) 150/80  11/09/18 128/76    Lab Results  Component Value Date   HGBA1C 6.2 (H) 12/03/2019     Patient obtains medications through Adherence Packaging  90 Days   Last adherence delivery included:  Hydrochlorothiazide 25 mg tab one tab every morning Losartan 100 mg tab one tab every morning Metformin 500 mg tab one tab in morning and take two tablets in evening Potassium Chloride Er 5mq tab one tab every morning   Patient is due for next adherence delivery on: 12-17-19. Called patient and reviewed medications. Patient is not needing delivery at this moment.  Patient declined the following medications   Hydrochlorothiazide 25 mg tab one tab every morning Losartan 100 mg  tab one tab every morning Metformin 500 mg tab one tab in morning and take two tablets in evening Potassium Chloride Er 59mq tab one tab every morning   Patient needs refills for ***.  Confirmed delivery date of ***, advised patient that pharmacy will contact them the morning of delivery.      Follow-Up:   Pharmacist Review

## 2019-12-11 NOTE — Telephone Encounter (Signed)
Patient's wife would like to know if he should get the covid booster eventhough he had been diagnosed with covid with no symptoms.

## 2019-12-12 ENCOUNTER — Other Ambulatory Visit: Payer: Self-pay | Admitting: *Deleted

## 2019-12-12 ENCOUNTER — Encounter: Payer: Self-pay | Admitting: *Deleted

## 2019-12-12 NOTE — Patient Outreach (Signed)
[facility]) Care Management [facility] CM Telephone Outreach, EMMI Red-Alert notification/ General Discharge PCP office completes Transition of Care follow up post-hospital discharge Post-hospital discharge day # 7  12/12/2019  John Woodward 01-Nov-1945 [id number]  EMMI Red-Alert notification/ General Discharge EMMI call date/ day #: Monday 12/11/19; day # 4 Red-Alert reason(s): "sad/ depressed/ hopeless/ empty"  Successful outgoing initial outreach attempt to John Woodward, spouse/ caregiver for John Woodward, 74 y/o male referred to [facility] RN CM this morning by [facility] CMA after EMMI Red- alert notification, as above.  Patient was recently hospitalized November 7-9, 2021 for SOB/ syncope and was diagnosed with A-Fib with RVR/ AKI dehydration; while hospitalized, he had incidental finding of positive corona virus test, patient was asymptomatic throughout hospitalization; patient fully vaccinated for corona virus.  Patient has history including, but not limited to, HTN/ HLD; DM-II, controlled; A-Fib; emphysema, and ongoing tobacco use.  HIPAA/ identity verified and purpose of call discussed with spouse, who is agreeable to complete screening call for EMMI Red-Alert; she reports that she knows patient "would not agree" to ongoing participation in [facility] CM program, and by end of call, she declines [facility] CM services, stating no patient care coordination/ disease management/ pharmacy/ community resource needs.  Reports that patient has been depressed since the loss of their family pet in September; reports PCP just placed patient on new antidepressant, which he has started taking.  States that patient has been sleeping a lot and she is unsure whether or not this is from post-hospital fatigue, depression, or positive covid test; she confirms that patient remains asymptomatic and has no signs/ symptoms covid post-hospital discharge.  She denies concerns around medications; reports she manages  patient's medications by keeping his pill box filled and patient then takes independently, only needing reminders occasionally.  Patient was recently discharged from hospital and all medications were reviewed with caregiver- no discrepancies/ concerns identified.   Reports patient has had progressive mild cognitive changes over last year; followed by neurology; reports this is stable, but she believes may be progressing.  She declines answering depression screen for patient, and asks patient to speak with me; he declines stating he does not wish to speak to any nurse today, adding that he "is fine."    Screening call completed.  Patient's caregiver denies further issues, concerns, or problems today.  I provided/ confirmed that patient/ caregiver have my direct phone number, the main [facility] CM office phone number, and the [facility] CM 24-hour nurse advice phone number should issues arise in the future and patient wish to participate in [place] program.    Plan:  Will make patient inactive with [facility] CM, as he has declined ongoing participation in [place] program and make PCP aware of same  Oneta Rack, RN, BSN, [place] Coordinator [facility] Care Management  [phone]

## 2019-12-12 NOTE — Telephone Encounter (Signed)
Called patient's wife regarding PCP recommendations. She voiced understanding.

## 2019-12-12 NOTE — Telephone Encounter (Signed)
Since he just had COVID they can defer the booster for 3 months if they would like but in the long-run it is still recommended.

## 2019-12-13 ENCOUNTER — Telehealth: Payer: Self-pay | Admitting: Physician Assistant

## 2019-12-13 NOTE — Telephone Encounter (Signed)
Called patient with PCP recommendations. Patient has no URI symptoms, just loose stools Patient will be in tomorrow for lab work and flu shot.

## 2019-12-13 NOTE — Telephone Encounter (Signed)
If no fever or URI symptoms, no need to reschedule for loose stools

## 2019-12-13 NOTE — Telephone Encounter (Signed)
Patient is scheduled for lab appointment tomorrow. Per covid questions, do we reschedule?

## 2019-12-13 NOTE — Telephone Encounter (Signed)
Patients wife called stating that her husband is having diarrhea but she doesn't know how long this has been going on.  She wanted you to know that she gave him an immodium AD today and that a nurse from the hospital had called questioning them about a flu shot - please advise

## 2019-12-14 ENCOUNTER — Other Ambulatory Visit: Payer: Self-pay

## 2019-12-14 ENCOUNTER — Ambulatory Visit (INDEPENDENT_AMBULATORY_CARE_PROVIDER_SITE_OTHER): Payer: Medicare HMO

## 2019-12-14 ENCOUNTER — Inpatient Hospital Stay: Payer: Medicare HMO | Admitting: Physician Assistant

## 2019-12-14 DIAGNOSIS — L03113 Cellulitis of right upper limb: Secondary | ICD-10-CM | POA: Diagnosis not present

## 2019-12-14 LAB — CBC WITH DIFFERENTIAL/PLATELET
Basophils Absolute: 0 10*3/uL (ref 0.0–0.1)
Basophils Relative: 0.6 % (ref 0.0–3.0)
Eosinophils Absolute: 0.1 10*3/uL (ref 0.0–0.7)
Eosinophils Relative: 1.1 % (ref 0.0–5.0)
HCT: 41.5 % (ref 39.0–52.0)
Hemoglobin: 14.1 g/dL (ref 13.0–17.0)
Lymphocytes Relative: 16.6 % (ref 12.0–46.0)
Lymphs Abs: 1.3 10*3/uL (ref 0.7–4.0)
MCHC: 33.9 g/dL (ref 30.0–36.0)
MCV: 94.2 fl (ref 78.0–100.0)
Monocytes Absolute: 0.7 10*3/uL (ref 0.1–1.0)
Monocytes Relative: 9.2 % (ref 3.0–12.0)
Neutro Abs: 5.6 10*3/uL (ref 1.4–7.7)
Neutrophils Relative %: 72.5 % (ref 43.0–77.0)
Platelets: 399 10*3/uL (ref 150.0–400.0)
RBC: 4.41 Mil/uL (ref 4.22–5.81)
RDW: 13.1 % (ref 11.5–15.5)
WBC: 7.7 10*3/uL (ref 4.0–10.5)

## 2019-12-14 LAB — BASIC METABOLIC PANEL
BUN: 21 mg/dL (ref 6–23)
CO2: 27 mEq/L (ref 19–32)
Calcium: 8.6 mg/dL (ref 8.4–10.5)
Chloride: 105 mEq/L (ref 96–112)
Creatinine, Ser: 1.23 mg/dL (ref 0.40–1.50)
GFR: 58.06 mL/min — ABNORMAL LOW (ref >60.00)
Glucose, Bld: 150 mg/dL — ABNORMAL HIGH (ref 70–99)
Potassium: 4.3 mEq/L (ref 3.5–5.1)
Sodium: 140 mEq/L (ref 135–145)

## 2019-12-14 NOTE — Progress Notes (Signed)
Kai Levins., 74 y.o. male here for lab work. Was unable to obtain on the day of his visit. Fritz Pickerel, LPN

## 2019-12-15 ENCOUNTER — Inpatient Hospital Stay: Payer: Medicare HMO | Admitting: Physician Assistant

## 2019-12-22 ENCOUNTER — Encounter: Payer: Self-pay | Admitting: Physician Assistant

## 2019-12-22 NOTE — Progress Notes (Signed)
Patient presents to clinic today c/o pain, redness, swelling and drainage of his right upper arm at site of prior IV during his past hospitalization .  Notes this just developed yesterday morning.  Wife says the area looked like a pimple.  She squeezed with some purulent drainage.  Has noticed redness and tenderness since.  Low-grade fever last night but none this morning.  Denies any chest pain or shortness of breath.  Otherwise has been doing well after his recent hospitalization for atrial fibrillation and dehydration.  Has follow-up scheduled cardiology.  Past Medical History:  Diagnosis Date   Arthritis    Closed fracture dislocation of right elbow    30 years ago -- fell out of theback of a truck   Diabetes mellitus without complication ([place])    Hx of colonic polyps 02/23/2017   Hyperlipidemia    Hypertension    Diagnosed 10 years ago. No on medication. Denies history of CAD.     Current Outpatient Medications on File Prior to Visit  Medication Sig Dispense Refill   acetaminophen (TYLENOL) 500 MG tablet Take 500-1,000 mg by mouth every 6 (six) hours as needed for mild pain (or headaches).      apixaban (ELIQUIS) 5 MG TABS tablet Take 1 tablet (5 mg total) by mouth 2 (two) times daily. 60 tablet 0   metFORMIN (GLUCOPHAGE) 500 MG tablet TAKE 1 TABLET BY MOUTH DAILY IN THE MORNING AND 2 TABLETS DAILY IN THE EVENING (Patient taking differently: Take 500-1,000 mg by mouth See admin instructions. Take 500 mg by mouth in the morning and 1,000 mg at bedtime) 270 tablet 1   metoprolol tartrate (LOPRESSOR) 50 MG tablet Take 1 tablet (50 mg total) by mouth 2 (two) times daily. 60 tablet 0   nicotine polacrilex (COMMIT) 4 MG lozenge Take 4 mg by mouth as needed for smoking cessation.     rosuvastatin (CRESTOR) 10 MG tablet Take 1 tablet on Mon, Wed, Fri (Patient taking differently: Take 10 mg by mouth every Monday, Wednesday, and Friday. Take 1 tablet on Mon, Wed, Fri) 36 tablet 3    No current facility-administered medications on file prior to visit.    Allergies  Allergen Reactions   Antibacterial Hand Soap [Triclosan] Other (See Comments)    Caused water blisters    Family History  Problem Relation Age of Onset   Hypertension Mother    Dementia Mother        16s   Transient ischemic attack Mother        87s   Kidney disease Father    COPD Father    Hypertension Brother    Alcohol abuse Maternal Grandfather    Mental illness Paternal Grandmother    Hypertension Daughter    Healthy Son    Learning disabilities Son    Lung cancer Maternal Aunt    Colon cancer Neg Hx    Colon polyps Neg Hx    Esophageal cancer Neg Hx    Rectal cancer Neg Hx    Stomach cancer Neg Hx     Social History   Socioeconomic History   Marital status: Married    Spouse name: Not on file   Number of children: 3   Years of education: Not on file   Highest education level: 12th grade  Occupational History   Occupation: Retired    Comment: Dealer  Tobacco Use   Smoking status: Current Every Day Smoker    Packs/day: 0.25    Years: 50.00  Pack years: 12.50    Types: Cigarettes    Start date: 08/12/1966   Smokeless tobacco: Never Used   Tobacco comment: Taking Nicotine lozenges  Vaping Use   Vaping Use: Never used  Substance and Sexual Activity   Alcohol use: No   Drug use: No   Sexual activity: Yes  Other Topics Concern   Not on file  Social History Narrative   Social Review      Patient lives at home with. Spouse Finis Hendricksen   Pt lives in a one or two story home? One story home   Does patient lives in a facility? If so where? Private home   What is patient highest level of education? 12 grade   Pt is RIGHT handed   Social Determinants of Health   Financial Resource Strain:    Difficulty of Paying Living Expenses: Not on file  Food Insecurity: No Food Insecurity   Worried About [place] in the Last  Year: Never true   Ran Out of Food in the Last Year: Never true  Transportation Needs: No Transportation Needs   Lack of Transportation (Medical): No   Lack of Transportation (Non-Medical): No  Physical Activity:    Days of Exercise per Week: Not on file   Minutes of Exercise per Session: Not on file  Stress:    Feeling of Stress : Not on file  Social Connections:    Frequency of Communication with Friends and Family: Not on file   Frequency of Social Gatherings with Friends and Family: Not on file   Attends Religious Services: Not on file   Active Member of Clubs or Organizations: Not on file   Attends Archivist Meetings: Not on file   Marital Status: Not on file   Review of Systems - See HPI.  All other ROS are negative.  BP 110/78    Pulse 97    Temp 98.2 F (36.8 C)    Resp 14    SpO2 97%   Physical Exam Vitals reviewed.  Constitutional:      Appearance: Normal appearance.  HENT:     Head: Normocephalic and atraumatic.     Nose: Nose normal.  Eyes:     Extraocular Movements: Extraocular movements intact.     Conjunctiva/sclera: Conjunctivae normal.     Pupils: Pupils are equal, round, and reactive to light.  Cardiovascular:     Rate and Rhythm: Normal rate and regular rhythm.     Pulses: Normal pulses.     Heart sounds: Normal heart sounds.  Pulmonary:     Effort: Pulmonary effort is normal.     Breath sounds: Rales (faint -- lung bases) present.  Musculoskeletal:     Cervical back: Neck supple.  Skin:      Neurological:     General: No focal deficit present.     Mental Status: He is alert and oriented to person, place, and time.     Recent Results (from the past 2160 hour(s))  CBG monitoring, ED     Status: Abnormal   Collection Time: 12/03/19 12:17 PM  Result Value Ref Range   Glucose-Capillary 123 (H) 70 - 99 mg/dL    Comment: Glucose reference range applies only to samples taken after fasting for at least 8 hours.   Comment 1  Notify RN    Comment 2 Document in Chart   CBC with Differential     Status: Abnormal   Collection Time: 12/03/19 12:28 PM  Result Value Ref Range   WBC 4.6 4.0 - 10.5 K/uL   RBC 4.97 4.22 - 5.81 MIL/uL   Hemoglobin 16.1 13.0 - 17.0 g/dL   HCT 48.2 39 - 52 %   MCV 97.0 80.0 - 100.0 fL   MCH 32.4 26.0 - 34.0 pg   MCHC 33.4 30.0 - 36.0 g/dL   RDW 13.2 11.5 - 15.5 %   Platelets 142 (L) 150 - 400 K/uL   nRBC 0.0 0.0 - 0.2 %   Neutrophils Relative % 70 %   Neutro Abs 3.2 1.7 - 7.7 K/uL   Lymphocytes Relative 22 %   Lymphs Abs 1.0 0.7 - 4.0 K/uL   Monocytes Relative 8 %   Monocytes Absolute 0.4 0.1 - 1.0 K/uL   Eosinophils Relative 0 %   Eosinophils Absolute 0.0 0.0 - 0.5 K/uL   Basophils Relative 0 %   Basophils Absolute 0.0 0.0 - 0.1 K/uL   Immature Granulocytes 0 %   Abs Immature Granulocytes 0.01 0.00 - 0.07 K/uL    Comment: Performed at [facility] [address], [place], [place] [address]  Comprehensive metabolic panel     Status: Abnormal   Collection Time: 12/03/19 12:28 PM  Result Value Ref Range   Sodium 138 135 - 145 mmol/L   Potassium 4.4 3.5 - 5.1 mmol/L   Chloride 104 98 - 111 mmol/L   CO2 24 22 - 32 mmol/L   Glucose, Bld 149 (H) 70 - 99 mg/dL    Comment: Glucose reference range applies only to samples taken after fasting for at least 8 hours.   BUN 32 (H) 8 - 23 mg/dL   Creatinine, Ser 1.83 (H) 0.61 - 1.24 mg/dL   Calcium 8.3 (L) 8.9 - 10.3 mg/dL   Total Protein 6.2 (L) 6.5 - 8.1 g/dL   Albumin 3.2 (L) 3.5 - 5.0 g/dL   AST 25 15 - 41 U/L   ALT 17 0 - 44 U/L   Alkaline Phosphatase 54 38 - 126 U/L   Total Bilirubin 1.0 0.3 - 1.2 mg/dL   GFR, Estimated 38 (L) >60 mL/min    Comment: (NOTE) Calculated using the CKD-EPI Creatinine Equation (2021)    Anion gap 10 5 - 15    Comment: Performed at [facility], [place] [address], [place], [place] [address]  Magnesium     Status: None   Collection Time: 12/03/19 12:28 PM  Result Value Ref Range    Magnesium 1.9 1.7 - 2.4 mg/dL    Comment: Performed at [facility], [place] [address] [place], [place] [address]  T4, free     Status: None   Collection Time: 12/03/19 12:28 PM  Result Value Ref Range   Free T4 1.03 0.61 - 1.12 ng/dL    Comment: (NOTE) Biotin ingestion may interfere with free T4 tests. If the results are inconsistent with the TSH level, previous test results, or the clinical presentation, then consider biotin interference. If needed, order repeat testing after stopping biotin. Performed at [facility], [place] [address] [place],  [address]   Troponin I (High Sensitivity)     Status: Abnormal   Collection Time: 12/03/19 12:28 PM  Result Value Ref Range   Troponin I (High Sensitivity) 71 (H) <18 ng/L    Comment: (NOTE) Elevated high sensitivity troponin I (hsTnI) values and significant  changes across serial measurements may suggest ACS but many other  chronic and acute conditions are known  to elevate hsTnI results.  Refer to the Links section for chest pain algorithms and additional  guidance. Performed at [facility], [place] [address], [place], [place] [address]   TSH     Status: None   Collection Time: 12/03/19 12:28 PM  Result Value Ref Range   TSH 2.605 0.350 - 4.500 uIU/mL    Comment: Performed by a 3rd Generation assay with a functional sensitivity of <=0.01 uIU/mL. Performed at [facility], [place] [address] [place], [place] [address]   Uric acid     Status: None   Collection Time: 12/03/19 12:28 PM  Result Value Ref Range   Uric Acid, Serum 7.5 3.7 - 8.6 mg/dL    Comment: Performed at [facility], [place] [address] [place], [place] [address]  Brain natriuretic peptide     Status: Abnormal   Collection Time: 12/03/19 12:28 PM  Result Value Ref Range   B Natriuretic Peptide 117.0 (H) 0.0 - 100.0 pg/mL    Comment: Performed at [facility] [address], [place], [place] [address]  C-reactive protein     Status:  Abnormal   Collection Time: 12/03/19 12:28 PM  Result Value Ref Range   CRP 1.7 (H) <1.0 mg/dL    Comment: Performed at [facility], [place] [address], [place], [place] [address]  D-dimer, quantitative (not at [facility])     Status: Abnormal   Collection Time: 12/03/19 12:28 PM  Result Value Ref Range   D-Dimer, Quant 1.48 (H) 0.00 - 0.50 ug/mL-FEU    Comment: (NOTE) At the manufacturer cut-off value of 0.5 g/mL FEU, this assay has a negative predictive value of 95-100%.This assay is intended for use in conjunction with a clinical pretest probability (PTP) assessment model to exclude pulmonary embolism (PE) and deep venous thrombosis (DVT) in outpatients suspected of PE or DVT. Results should be correlated with clinical presentation. Performed at [facility], [place] [address], [place], [place] [address]   Ferritin     Status: None   Collection Time: 12/03/19 12:28 PM  Result Value Ref Range   Ferritin 270 24 - 336 ng/mL    Comment: Performed at [facility], [place] [address] [place], [place] [address]  Fibrinogen     Status: Abnormal   Collection Time: 12/03/19 12:28 PM  Result Value Ref Range   Fibrinogen 478 (H) 210 - 475 mg/dL    Comment: Performed at [facility] [address], [place], [place] [address]  Lactate dehydrogenase     Status: Abnormal   Collection Time: 12/03/19 12:28 PM  Result Value Ref Range   LDH 267 (H) 98 - 192 U/L    Comment: SLIGHT HEMOLYSIS Performed at [facility], [place] [address], [place], [place] [address]   Procalcitonin     Status: None   Collection Time: 12/03/19 12:28 PM  Result Value Ref Range   Procalcitonin 0.22 ng/mL    Comment:        Interpretation: PCT (Procalcitonin) <= 0.5 ng/mL: Systemic infection (sepsis) is not likely. Local bacterial infection is possible. (NOTE)       Sepsis PCT Algorithm           Lower Respiratory Tract                                      Infection  PCT Algorithm     ----------------------------     ----------------------------         PCT < 0.25 ng/mL                PCT < 0.10 ng/mL          Strongly encourage             Strongly discourage   discontinuation of antibiotics    initiation of antibiotics    ----------------------------     -----------------------------       PCT 0.25 - 0.50 ng/mL            PCT 0.10 - 0.25 ng/mL               OR       >80% decrease in PCT            Discourage initiation of                                            antibiotics      Encourage discontinuation           of antibiotics    ----------------------------     -----------------------------         PCT >= 0.50 ng/mL              PCT 0.26 - 0.50 ng/mL               AND        <80% decrease in PCT             Encourage initiation of                                             antibiotics       Encourage continuation           of antibiotics    ----------------------------     -----------------------------        PCT >= 0.50 ng/mL                  PCT > 0.50 ng/mL               AND         increase in PCT                  Strongly encourage                                      initiation of antibiotics    Strongly encourage escalation           of antibiotics                                     -----------------------------                                           PCT <= 0.25 ng/mL  OR                                        > 80% decrease in PCT                                      Discontinue / Do not initiate                                             antibiotics  Performed at [facility], [place] [address], [place], [place] [address]   Hemoglobin A1c     Status: Abnormal   Collection Time: 12/03/19 12:28 PM  Result Value Ref Range   Hgb A1c MFr Bld 6.2 (H) 4.8 - 5.6 %    Comment: (NOTE) Pre diabetes:          5.7%-6.4%  Diabetes:              >6.4%  Glycemic control for   <7.0% adults with  diabetes    Mean Plasma Glucose 131.24 mg/dL    Comment: Performed at [facility] [address], [place], [place] [address]  Respiratory Panel by RT PCR (Flu A&B, Covid) - Nasopharyngeal Swab     Status: Abnormal   Collection Time: 12/03/19  2:27 PM   Specimen: Nasopharyngeal Swab  Result Value Ref Range   SARS Coronavirus 2 by RT PCR POSITIVE (A) NEGATIVE    Comment: RESULT CALLED TO, READ BACK BY AND VERIFIED WITH: RN A STRAYTER [id number] MLM (NOTE) SARS-CoV-2 target nucleic acids are DETECTED.  SARS-CoV-2 RNA is generally detectable in upper respiratory specimens  during the acute phase of infection. Positive results are indicative of the presence of the identified virus, but do not rule out bacterial infection or co-infection with other pathogens not detected by the test. Clinical correlation with patient history and other diagnostic information is necessary to determine patient infection status. The expected result is Negative.  Fact Sheet for Patients:  PinkCheek.be  Fact Sheet for Healthcare Providers: GravelBags.it  This test is not yet approved or cleared by the [place] FDA and  has been authorized for detection and/or diagnosis of SARS-CoV-2 by FDA under an Emergency Use Authorization (EUA).  This EUA will remain in effect (meaning this test can be used)  for the duration of  the COVID-19 declaration under Section 564(b)(1) of the Act, 21 U.S.C. section 360bbb-3(b)(1), unless the authorization is terminated or revoked sooner.      Influenza A by PCR NEGATIVE NEGATIVE   Influenza B by PCR NEGATIVE NEGATIVE    Comment: (NOTE) The Xpert Xpress SARS-CoV-2/FLU/RSV assay is intended as an aid in  the diagnosis of influenza from Nasopharyngeal swab specimens and  should not be used as a sole basis for treatment. Nasal washings and  aspirates are unacceptable for Xpert Xpress SARS-CoV-2/FLU/RSV    testing.  Fact Sheet for Patients: PinkCheek.be  Fact Sheet for Healthcare Providers: GravelBags.it  This test is not yet approved or cleared by the [place] FDA and  has been authorized for detection and/or diagnosis of SARS-CoV-2 by  FDA under an Emergency Use Authorization (EUA). This EUA will remain  in effect (  meaning this test can be used) for the duration of the  Covid-19 declaration under Section 564(b)(1) of the Act, 21  U.S.C. section 360bbb-3(b)(1), unless the authorization is  terminated or revoked. Performed at [facility], [place] [address], [place], [place] [address]   Sodium, urine, random     Status: None   Collection Time: 12/03/19  4:57 PM  Result Value Ref Range   Sodium, Ur 100 mmol/L    Comment: Performed at [facility] [address], [place], [place] [address]  Creatinine, urine, random     Status: None   Collection Time: 12/03/19  4:57 PM  Result Value Ref Range   Creatinine, Urine 130.52 mg/dL    Comment: Performed at [facility] [address], [place], [place] [address]  Urinalysis, Routine w reflex microscopic Urine, Random     Status: Abnormal   Collection Time: 12/03/19  4:57 PM  Result Value Ref Range   Color, Urine YELLOW YELLOW   APPearance CLEAR CLEAR   Specific Gravity, Urine 1.015 1.005 - 1.030   pH 6.0 5.0 - 8.0   Glucose, UA NEGATIVE NEGATIVE mg/dL   Hgb urine dipstick NEGATIVE NEGATIVE   Bilirubin Urine NEGATIVE NEGATIVE   Ketones, ur NEGATIVE NEGATIVE mg/dL   Protein, ur 100 (A) NEGATIVE mg/dL   Nitrite NEGATIVE NEGATIVE   Leukocytes,Ua NEGATIVE NEGATIVE   RBC / HPF 0-5 0 - 5 RBC/hpf   WBC, UA 0-5 0 - 5 WBC/hpf   Bacteria, UA NONE SEEN NONE SEEN   Mucus PRESENT    Hyaline Casts, UA PRESENT     Comment: Performed at [facility] [address], [place], [place] [address]  Glucose, capillary     Status: Abnormal   Collection Time: 12/03/19   9:03 PM  Result Value Ref Range   Glucose-Capillary 103 (H) 70 - 99 mg/dL    Comment: Glucose reference range applies only to samples taken after fasting for at least 8 hours.  Heparin level (unfractionated)     Status: None   Collection Time: 12/04/19  3:29 AM  Result Value Ref Range   Heparin Unfractionated 0.53 0.30 - 0.70 IU/mL    Comment: (NOTE) If heparin results are below expected values, and patient dosage has  been confirmed, suggest follow up testing of antithrombin III levels. Performed at [facility], [place] [address] [place], [place] [address]   Lipid panel     Status: Abnormal   Collection Time: 12/04/19  3:29 AM  Result Value Ref Range   Cholesterol 158 0 - 200 mg/dL   Triglycerides 129 <150 mg/dL   HDL 31 (L) >40 mg/dL   Total CHOL/HDL Ratio 5.1 RATIO   VLDL 26 0 - 40 mg/dL   LDL Cholesterol 101 (H) 0 - 99 mg/dL    Comment:        Total Cholesterol/HDL:CHD Risk Coronary Heart Disease Risk Table                     Men   Women  1/2 Average Risk   3.4   3.3  Average Risk       5.0   4.4  2 X Average Risk   9.6   7.1  3 X Average Risk  23.4   11.0        Use the calculated Patient Ratio above and the CHD Risk Table to determine the patient's CHD Risk.  ATP III CLASSIFICATION (LDL):  <100     mg/dL   Optimal  100-129  mg/dL   Near or Above                    Optimal  130-159  mg/dL   Borderline  160-189  mg/dL   High  >190     mg/dL   Very High Performed at [facility] [address], [place], [place] [address]   CBC with Differential/Platelet     Status: Abnormal   Collection Time: 12/04/19  3:29 AM  Result Value Ref Range   WBC 4.9 4.0 - 10.5 K/uL   RBC 4.73 4.22 - 5.81 MIL/uL   Hemoglobin 15.4 13.0 - 17.0 g/dL   HCT 44.4 39 - 52 %   MCV 93.9 80.0 - 100.0 fL   MCH 32.6 26.0 - 34.0 pg   MCHC 34.7 30.0 - 36.0 g/dL   RDW 13.0 11.5 - 15.5 %   Platelets 143 (L) 150 - 400 K/uL   nRBC 0.0 0.0 - 0.2 %   Neutrophils Relative % 71 %    Neutro Abs 3.5 1.7 - 7.7 K/uL   Lymphocytes Relative 22 %   Lymphs Abs 1.1 0.7 - 4.0 K/uL   Monocytes Relative 7 %   Monocytes Absolute 0.3 0.1 - 1.0 K/uL   Eosinophils Relative 0 %   Eosinophils Absolute 0.0 0.0 - 0.5 K/uL   Basophils Relative 0 %   Basophils Absolute 0.0 0.0 - 0.1 K/uL   Immature Granulocytes 0 %   Abs Immature Granulocytes 0.02 0.00 - 0.07 K/uL    Comment: Performed at [facility] [address] [place], [place] [address]  Comprehensive metabolic panel     Status: Abnormal   Collection Time: 12/04/19  3:29 AM  Result Value Ref Range   Sodium 137 135 - 145 mmol/L   Potassium 3.5 3.5 - 5.1 mmol/L   Chloride 104 98 - 111 mmol/L   CO2 22 22 - 32 mmol/L   Glucose, Bld 129 (H) 70 - 99 mg/dL    Comment: Glucose reference range applies only to samples taken after fasting for at least 8 hours.   BUN 31 (H) 8 - 23 mg/dL   Creatinine, Ser 1.47 (H) 0.61 - 1.24 mg/dL   Calcium 8.3 (L) 8.9 - 10.3 mg/dL   Total Protein 6.2 (L) 6.5 - 8.1 g/dL   Albumin 3.1 (L) 3.5 - 5.0 g/dL   AST 22 15 - 41 U/L   ALT 18 0 - 44 U/L   Alkaline Phosphatase 55 38 - 126 U/L   Total Bilirubin 0.7 0.3 - 1.2 mg/dL   GFR, Estimated 50 (L) >60 mL/min    Comment: (NOTE) Calculated using the CKD-EPI Creatinine Equation (2021)    Anion gap 11 5 - 15    Comment: Performed at [facility], [place] [address], [place], [place] [address]  C-reactive protein     Status: Abnormal   Collection Time: 12/04/19  3:29 AM  Result Value Ref Range   CRP 2.7 (H) <1.0 mg/dL    Comment: Performed at [facility] [address], [place], [place] [address]  D-dimer, quantitative (not at [facility])     Status: Abnormal   Collection Time: 12/04/19  3:29 AM  Result Value Ref Range   D-Dimer, Quant 1.07 (H) 0.00 - 0.50 ug/mL-FEU    Comment: (NOTE) At the manufacturer cut-off value of 0.5  g/mL FEU, this assay has a negative predictive value of 95-100%.This assay is intended for use in conjunction with a  clinical pretest probability (PTP) assessment model to exclude pulmonary embolism (PE) and deep venous thrombosis (DVT) in outpatients suspected of PE or DVT. Results should be correlated with clinical presentation. Performed at [facility], [place] [address], [place], [place] [address]   Ferritin     Status: None   Collection Time: 12/04/19  3:29 AM  Result Value Ref Range   Ferritin 130 24 - 336 ng/mL    Comment: Performed at [facility] [address] [place], [place] [address]  Magnesium     Status: None   Collection Time: 12/04/19  3:29 AM  Result Value Ref Range   Magnesium 1.7 1.7 - 2.4 mg/dL    Comment: Performed at [facility] [address], [place], [place] [address]  Phosphorus     Status: None   Collection Time: 12/04/19  3:29 AM  Result Value Ref Range   Phosphorus 3.1 2.5 - 4.6 mg/dL    Comment: Performed at [facility] [address] [place], [place] [address]  Troponin I (High Sensitivity)     Status: Abnormal   Collection Time: 12/04/19  3:29 AM  Result Value Ref Range   Troponin I (High Sensitivity) 54 (H) <18 ng/L    Comment: (NOTE) Elevated high sensitivity troponin I (hsTnI) values and significant  changes across serial measurements may suggest ACS but many other  chronic and acute conditions are known to elevate hsTnI results.  Refer to the "Links" section for chest pain algorithms and additional  guidance. Performed at [facility], [place] [address] [place], [place] [address]   TSH     Status: None   Collection Time: 12/04/19  3:29 AM  Result Value Ref Range   TSH 0.973 0.350 - 4.500 uIU/mL    Comment: Performed by a 3rd Generation assay with a functional sensitivity of <=0.01 uIU/mL. Performed at [facility], [place] [address], [place], [place] [address]   Brain natriuretic peptide     Status: Abnormal   Collection Time: 12/04/19  3:29 AM  Result Value Ref Range   B Natriuretic Peptide 190.3 (H) 0.0 - 100.0  pg/mL    Comment: Performed at [facility] [address], [place], [place] [address]  Type and screen [place]     Status: None   Collection Time: 12/04/19  3:36 AM  Result Value Ref Range   ABO/RH(D) A POS    Antibody Screen NEG    Sample Expiration      12/07/2019,2359 Performed at [facility], [place] [address], [place], [place] [address]   Glucose, capillary     Status: Abnormal   Collection Time: 12/04/19  7:30 AM  Result Value Ref Range   Glucose-Capillary 122 (H) 70 - 99 mg/dL    Comment: Glucose reference range applies only to samples taken after fasting for at least 8 hours.  ECHOCARDIOGRAM COMPLETE     Status: None   Collection Time: 12/04/19 10:02 AM  Result Value Ref Range   Weight 2,640 oz   Height 71 in   BP 146/88 mmHg   S' Lateral 2.81 cm   Area-P 1/2 3.05 cm2  Glucose, capillary     Status: Abnormal   Collection Time: 12/04/19 11:34 AM  Result Value Ref Range   Glucose-Capillary 127 (H)  70 - 99 mg/dL    Comment: Glucose reference range applies only to samples taken after fasting for at least 8 hours.  Glucose, capillary     Status: Abnormal   Collection Time: 12/04/19  5:20 PM  Result Value Ref Range   Glucose-Capillary 198 (H) 70 - 99 mg/dL    Comment: Glucose reference range applies only to samples taken after fasting for at least 8 hours.  ABO/Rh     Status: None   Collection Time: 12/04/19  8:34 PM  Result Value Ref Range   ABO/RH(D)      A POS Performed at [facility], [place] [address], [place], [place] [address]   Glucose, capillary     Status: Abnormal   Collection Time: 12/04/19  8:56 PM  Result Value Ref Range   Glucose-Capillary 104 (H) 70 - 99 mg/dL    Comment: Glucose reference range applies only to samples taken after fasting for at least 8 hours.   Comment 1 Document in Chart   CBC with Differential/Platelet     Status: Abnormal   Collection Time: 12/05/19  1:27 AM  Result Value Ref Range   WBC 3.5  (L) 4.0 - 10.5 K/uL   RBC 5.17 4.22 - 5.81 MIL/uL   Hemoglobin 16.3 13.0 - 17.0 g/dL   HCT 48.9 39 - 52 %   MCV 94.6 80.0 - 100.0 fL   MCH 31.5 26.0 - 34.0 pg   MCHC 33.3 30.0 - 36.0 g/dL   RDW 13.1 11.5 - 15.5 %   Platelets 137 (L) 150 - 400 K/uL   nRBC 0.0 0.0 - 0.2 %   Neutrophils Relative % 59 %   Neutro Abs 2.1 1.7 - 7.7 K/uL   Lymphocytes Relative 31 %   Lymphs Abs 1.1 0.7 - 4.0 K/uL   Monocytes Relative 10 %   Monocytes Absolute 0.3 0.1 - 1.0 K/uL   Eosinophils Relative 0 %   Eosinophils Absolute 0.0 0.0 - 0.5 K/uL   Basophils Relative 0 %   Basophils Absolute 0.0 0.0 - 0.1 K/uL   Immature Granulocytes 0 %   Abs Immature Granulocytes 0.01 0.00 - 0.07 K/uL    Comment: Performed at [facility], [address] [place], [place] [address]  Comprehensive metabolic panel     Status: Abnormal   Collection Time: 12/05/19  1:27 AM  Result Value Ref Range   Sodium 137 135 - 145 mmol/L   Potassium 5.1 3.5 - 5.1 mmol/L    Comment: DELTA CHECK NOTED SLIGHT HEMOLYSIS    Chloride 102 98 - 111 mmol/L   CO2 24 22 - 32 mmol/L   Glucose, Bld 105 (H) 70 - 99 mg/dL    Comment: Glucose reference range applies only to samples taken after fasting for at least 8 hours.   BUN 31 (H) 8 - 23 mg/dL   Creatinine, Ser 1.61 (H) 0.61 - 1.24 mg/dL   Calcium 8.5 (L) 8.9 - 10.3 mg/dL   Total Protein 6.3 (L) 6.5 - 8.1 g/dL   Albumin 3.0 (L) 3.5 - 5.0 g/dL   AST 27 15 - 41 U/L   ALT 22 0 - 44 U/L   Alkaline Phosphatase 55 38 - 126 U/L   Total Bilirubin 1.0 0.3 - 1.2 mg/dL   GFR, Estimated 45 (L) >60 mL/min    Comment: (NOTE) Calculated using the CKD-EPI Creatinine Equation (2021)    Anion gap 11 5 - 15    Comment: Performed  at [facility], [place] [address], [place], [place] [address]  C-reactive protein     Status: Abnormal   Collection Time: 12/05/19  1:27 AM  Result Value Ref Range   CRP 5.0 (H) <1.0 mg/dL    Comment: Performed at [facility] [address]  [place], [place] [address]  D-dimer, quantitative (not at [facility])     Status: Abnormal   Collection Time: 12/05/19  1:27 AM  Result Value Ref Range   D-Dimer, Quant 1.20 (H) 0.00 - 0.50 ug/mL-FEU    Comment: (NOTE) At the manufacturer cut-off value of 0.5 g/mL FEU, this assay has a negative predictive value of 95-100%.This assay is intended for use in conjunction with a clinical pretest probability (PTP) assessment model to exclude pulmonary embolism (PE) and deep venous thrombosis (DVT) in outpatients suspected of PE or DVT. Results should be correlated with clinical presentation. Performed at [facility], [place] [address], [place], [place] [address]   Magnesium     Status: None   Collection Time: 12/05/19  1:27 AM  Result Value Ref Range   Magnesium 2.0 1.7 - 2.4 mg/dL    Comment: Performed at [facility] [address] [place], [place] [address]  Brain natriuretic peptide     Status: Abnormal   Collection Time: 12/05/19  1:27 AM  Result Value Ref Range   B Natriuretic Peptide 270.8 (H) 0.0 - 100.0 pg/mL    Comment: Performed at [facility] [address] [place], [place] [address]  Glucose, capillary     Status: Abnormal   Collection Time: 12/05/19  7:22 AM  Result Value Ref Range   Glucose-Capillary 109 (H) 70 - 99 mg/dL    Comment: Glucose reference range applies only to samples taken after fasting for at least 8 hours.  Glucose, capillary     Status: Abnormal   Collection Time: 12/05/19 12:05 PM  Result Value Ref Range   Glucose-Capillary 122 (H) 70 - 99 mg/dL    Comment: Glucose reference range applies only to samples taken after fasting for at least 8 hours.  Glucose, capillary     Status: Abnormal   Collection Time: 12/05/19  4:11 PM  Result Value Ref Range   Glucose-Capillary 168 (H) 70 - 99 mg/dL    Comment: Glucose reference range applies only to samples taken after fasting for at least 8 hours.  CBC w/Diff     Status: None   Collection Time:  12/14/19 10:54 AM  Result Value Ref Range   WBC 7.7 4.0 - 10.5 K/uL   RBC 4.41 4.22 - 5.81 Mil/uL   Hemoglobin 14.1 13.0 - 17.0 g/dL   HCT 41.5 39 - 52 %   MCV 94.2 78.0 - 100.0 fl   MCHC 33.9 30.0 - 36.0 g/dL   RDW 13.1 11.5 - 15.5 %   Platelets 399.0 150 - 400 K/uL   Neutrophils Relative % 72.5 43 - 77 %   Lymphocytes Relative 16.6 12 - 46 %   Monocytes Relative 9.2 3 - 12 %   Eosinophils Relative 1.1 0 - 5 %   Basophils Relative 0.6 0 - 3 %   Neutro Abs 5.6 1.4 - 7.7 K/uL   Lymphs Abs 1.3 0.7 - 4.0 K/uL   Monocytes Absolute 0.7 0.1 - 1.0 K/uL   Eosinophils Absolute 0.1 0.0 - 0.7 K/uL   Basophils Absolute 0.0 0.0 - 0.1 K/uL  Basic metabolic panel  Status: Abnormal   Collection Time: 12/14/19 10:54 AM  Result Value Ref Range   Sodium 140 135 - 145 mEq/L   Potassium 4.3 3.5 - 5.1 mEq/L   Chloride 105 96 - 112 mEq/L   CO2 27 19 - 32 mEq/L   Glucose, Bld 150 (H) 70 - 99 mg/dL   BUN 21 6 - 23 mg/dL   Creatinine, Ser 1.23 0.40 - 1.50 mg/dL   GFR 58.06 (L) >60.00 mL/min    Comment: Calculated using the CKD-EPI Creatinine Equation (2021)   Calcium 8.6 8.4 - 10.5 mg/dL    Assessment/Plan: 1. Cellulitis of right upper extremity Patient afebrile.  No lymphatic streaking noted.  Will check CBC and BMP levels today.  Rx doxycycline 1 tablet twice daily x7 days.  Close follow-up scheduled. - doxycycline (VIBRA-TABS) 100 MG tablet; Take 1 tablet (100 mg total) by mouth 2 (two) times daily.  Dispense: 14 tablet; Refill: 0 - CBC w/Diff - Basic metabolic panel  2. Persistent atrial fibrillation (HCC) Question faint crackles in lower lung bases.  Will obtain x-ray to ensure no sign of vascular congestion given patient's A. fib and these exam findings. - DG Chest 2 View; Future  3. AKI (acute kidney injury) ([place]) Repeat BMP today to ensure renal function has returned to normal.  4. Depression, major, single episode, moderate ([place]) Noted since hospitalization.  Wife notes symptoms  of been present even before then.  No thoughts of self-harm.  Discussed treatment options including pharmacotherapy and counseling.  They would like to start medication.  Rx Lexapro 10 mg.  Close follow-up scheduled.  This visit occurred during the SARS-CoV-2 public health emergency.  Safety protocols were in place, including screening questions prior to the visit, additional usage of staff PPE, and extensive cleaning of exam room while observing appropriate contact time as indicated for disinfecting solutions.     Leeanne Rio, PA-C

## 2019-12-25 ENCOUNTER — Ambulatory Visit: Payer: Medicare HMO | Admitting: Student

## 2019-12-25 ENCOUNTER — Encounter: Payer: Self-pay | Admitting: Student

## 2019-12-25 ENCOUNTER — Other Ambulatory Visit: Payer: Self-pay

## 2019-12-25 VITALS — BP 184/74 | HR 48 | Resp 16 | Ht 71.0 in | Wt 154.4 lb

## 2019-12-25 DIAGNOSIS — I10 Essential (primary) hypertension: Secondary | ICD-10-CM | POA: Diagnosis not present

## 2019-12-25 DIAGNOSIS — R69 Illness, unspecified: Secondary | ICD-10-CM | POA: Diagnosis not present

## 2019-12-25 DIAGNOSIS — I480 Paroxysmal atrial fibrillation: Secondary | ICD-10-CM | POA: Diagnosis not present

## 2019-12-25 DIAGNOSIS — J449 Chronic obstructive pulmonary disease, unspecified: Secondary | ICD-10-CM | POA: Diagnosis not present

## 2019-12-25 DIAGNOSIS — R5383 Other fatigue: Secondary | ICD-10-CM | POA: Diagnosis not present

## 2019-12-25 DIAGNOSIS — E785 Hyperlipidemia, unspecified: Secondary | ICD-10-CM | POA: Diagnosis not present

## 2019-12-25 DIAGNOSIS — E1169 Type 2 diabetes mellitus with other specified complication: Secondary | ICD-10-CM | POA: Diagnosis not present

## 2019-12-25 DIAGNOSIS — I739 Peripheral vascular disease, unspecified: Secondary | ICD-10-CM | POA: Diagnosis not present

## 2019-12-25 DIAGNOSIS — E119 Type 2 diabetes mellitus without complications: Secondary | ICD-10-CM | POA: Diagnosis not present

## 2019-12-25 MED ORDER — LOSARTAN POTASSIUM 100 MG PO TABS: 100.0000 mg | ORAL_TABLET | Freq: Every day | ORAL | 3 refills | Status: DC

## 2019-12-25 NOTE — Progress Notes (Signed)
Primary Physician/Referring:  Brunetta Jeans, PA-C  Patient ID: John Heman., male    DOB: [birth year], 74 y.o.   MRN: [id number]  Chief Complaint  Patient presents with  . Atrial Fibrillation    mild case of covid  . Hospitalization Follow-up   HPI:    John Woodward.  is a 74 y.o. with history of COPD, hypertension, hyperlipidemia, type II diabetes mellitus non-insulin dependent. He presented to emergency department 12/03/2019 following a syncopal episode at home. Upon admission patient went into atrial fibrillation with RVR and was found to be positive for Covid 19. Patient was started on Eliquis for anticoagulation.  Patient presents for hospital follow-up.  On 12/03/2019 patient presented to [facility] emergency department following an episode of syncope.  Patient states that for 3 days leading up to this episode he had not been feeling well with decreased appetite and fluid intake as well as increased fatigue.  Wife assists patient with providing history, and they report patient had come down out of bed for breakfast and while sitting at the counter collapsed onto the counter and lost consciousness.  Patient does not recall prodromal symptoms and does not remember the episode.  Patient's wife states patient was unresponsive for approximately 1 minute and then she was able to rouse him and he opened his eyes.  Patient states he remembers opening his eyes, and his wife denies post syncopal confusion.  She does state that while he was unresponsive his muscles seem to to spasm.  Following syncopal episode patient had one episode of loose stool.  Patient's wife instructed her son to call EMS who came to the home and evaluated the patient and subsequently transported him to the emergency department.  Work-up in the emergency department revealed atrial fibrillation with rapid ventricular response, which was suspected to be etiology of syncopal episode in combination with likely dehydration  in view of AKI.  Since discharge from the hospital patient states he has been feeling well, but does report continued mild fatigue.  Denies chest pain, palpitations, shortness of breath, orthopnea, PND, leg swelling, syncope, near syncope, dizziness, claudication.  Patient does currently smoke half a pack per day, but is trying to quit.  He does not drink alcohol.  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  Patient is currently tolerating Eliquis without bleeding diathesis.  Patient also denies snoring.   Past Medical History:  Diagnosis Date  . Arthritis   . Closed fracture dislocation of right elbow    30 years ago -- fell out of theback of a truck  . Diabetes mellitus without complication ([place])   . Hx of colonic polyps 02/23/2017  . Hyperlipidemia   . Hypertension    Diagnosed 10 years ago. No on medication. Denies history of CAD.    Past Surgical History:  Procedure Laterality Date  . OLECRANON BURSECTOMY Right 08/18/2016   Procedure: OLECRANON BURSECTOMY;  Surgeon: Meredith Pel, MD;  Location: [facility];  Service: Orthopedics;  Laterality: Right;   Family History  Problem Relation Age of Onset  . Hypertension Mother   . Dementia Mother        54s  . Transient ischemic attack Mother        20s  . Kidney disease Father   . COPD Father   . Hypertension Brother   . Alcohol abuse Maternal Grandfather   . Mental illness Paternal Grandmother   . Hypertension Daughter   .  Healthy Son   . Learning disabilities Son   . Lung cancer Maternal Aunt   . Colon cancer Neg Hx   . Colon polyps Neg Hx   . Esophageal cancer Neg Hx   . Rectal cancer Neg Hx   . Stomach cancer Neg Hx     Social History   Tobacco Use  . Smoking status: Current Every Day Smoker    Packs/day: 0.25    Years: 50.00    Pack years: 12.50    Types: Cigarettes    Start date: 08/12/1966  . Smokeless tobacco: Never Used  . Tobacco comment: Taking Nicotine lozenges   Substance Use Topics  . Alcohol use: No   Marital Status: Married   ROS  Review of Systems  Constitutional: Negative for malaise/fatigue and weight gain.  Cardiovascular: Negative for chest pain, claudication, leg swelling, near-syncope, orthopnea, palpitations, paroxysmal nocturnal dyspnea and syncope.  Respiratory: Negative for shortness of breath.   Hematologic/Lymphatic: Does not bruise/bleed easily.  Gastrointestinal: Negative for melena.  Neurological: Negative for dizziness and weakness.    Objective  Blood pressure (!) 184/74, pulse (!) 48, resp. rate 16, height 5\' 11"  (1.803 m), weight 154 lb 6.4 oz (70 kg), SpO2 95 %.  Vitals with BMI 12/25/2019 12/25/2019 12/22/2019  Height - 5\' 11"  -  Weight - 154 lbs 6 oz -  BMI - 16.10 -  Systolic 960 454 098  Diastolic 74 80 78  Pulse 48 50 97      Physical Exam Vitals reviewed.  HENT:     Head: Normocephalic and atraumatic.  Cardiovascular:     Rate and Rhythm: Regular rhythm. Bradycardia present.     Pulses: No decreased pulses.          Carotid pulses are 2+ on the right side and 2+ on the left side.      Radial pulses are 2+ on the right side and 2+ on the left side.       Femoral pulses are 1+ on the right side and 1+ on the left side.      Popliteal pulses are 1+ on the right side and 1+ on the left side.       Dorsalis pedis pulses are 0 on the right side and 0 on the left side.       Posterior tibial pulses are 1+ on the right side and 0 on the left side.     Heart sounds: S1 normal and S2 normal. No murmur heard.  No gallop.      Comments: Venous stasis skin changes bilateral lower legs and feet, left more than right. No JVD, no leg edema.   Left foot cool to touch compared to right foot.  Pulmonary:     Effort: Pulmonary effort is normal. No respiratory distress.     Breath sounds: No wheezing, rhonchi or rales.  Musculoskeletal:     Right lower leg: No edema.     Left lower leg: No edema.  Neurological:      Mental Status: He is alert.    Laboratory examination:   Recent Labs    12/03/19 1228 12/03/19 1228 12/04/19 0329 12/05/19 0127 12/14/19 1054  NA 138   < > 137 137 140  K 4.4   < > 3.5 5.1 4.3  CL 104   < > 104 102 105  CO2 24   < > 22 24 27   GLUCOSE 149*   < > 129* 105* 150*  BUN 32*   < >  31* 31* 21  CREATININE 1.83*   < > 1.47* 1.61* 1.23  CALCIUM 8.3*   < > 8.3* 8.5* 8.6  GFRNONAA 38*  --  50* 45*  --    < > = values in this interval not displayed.   estimated creatinine clearance is 53 mL/min (by C-G formula based on SCr of 1.23 mg/dL).  CMP Latest Ref Rng & Units 12/14/2019 12/05/2019 12/04/2019  Glucose 70 - 99 mg/dL 150(H) 105(H) 129(H)  BUN 6 - 23 mg/dL 21 31(H) 31(H)  Creatinine 0.40 - 1.50 mg/dL 1.23 1.61(H) 1.47(H)  Sodium 135 - 145 mEq/L 140 137 137  Potassium 3.5 - 5.1 mEq/L 4.3 5.1 3.5  Chloride 96 - 112 mEq/L 105 102 104  CO2 19 - 32 mEq/L 27 24 22   Calcium 8.4 - 10.5 mg/dL 8.6 8.5(L) 8.3(L)  Total Protein 6.5 - 8.1 g/dL - 6.3(L) 6.2(L)  Total Bilirubin 0.3 - 1.2 mg/dL - 1.0 0.7  Alkaline Phos 38 - 126 U/L - 55 55  AST 15 - 41 U/L - 27 22  ALT 0 - 44 U/L - 22 18   CBC Latest Ref Rng & Units 12/14/2019 12/05/2019 12/04/2019  WBC 4.0 - 10.5 K/uL 7.7 3.5(L) 4.9  Hemoglobin 13.0 - 17.0 g/dL 14.1 16.3 15.4  Hematocrit 39 - 52 % 41.5 48.9 44.4  Platelets 150 - 400 K/uL 399.0 137(L) 143(L)    Lipid Panel Recent Labs    08/24/19 0840 12/04/19 0329  CHOL 216* 158  TRIG 212.0* 129  LDLCALC  --  101*  VLDL 42.4* 26  HDL 37.70* 31*  CHOLHDL 6 5.1  LDLDIRECT 139.0  --     HEMOGLOBIN A1C Lab Results  Component Value Date   HGBA1C 6.2 (H) 12/03/2019   MPG 131.24 12/03/2019   TSH Recent Labs    12/03/19 1228 12/04/19 0329  TSH 2.605 0.973    External labs:  None   Medications and allergies   Allergies  Allergen Reactions  . Antibacterial Hand Soap [Triclosan] Other (See Comments)    Caused water blisters     Outpatient Medications  Prior to Visit  Medication Sig Dispense Refill  . acetaminophen (TYLENOL) 500 MG tablet Take 500-1,000 mg by mouth every 6 (six) hours as needed for mild pain (or headaches).     Marland Kitchen apixaban (ELIQUIS) 5 MG TABS tablet Take 1 tablet (5 mg total) by mouth 2 (two) times daily. 60 tablet 0  . escitalopram (LEXAPRO) 10 MG tablet Take 1 tablet (10 mg total) by mouth daily. 90 tablet 0  . metFORMIN (GLUCOPHAGE) 500 MG tablet TAKE 1 TABLET BY MOUTH DAILY IN THE MORNING AND 2 TABLETS DAILY IN THE EVENING (Patient taking differently: Take 500-1,000 mg by mouth See admin instructions. Take 500 mg by mouth in the morning and 1,000 mg at bedtime) 270 tablet 1  . metoprolol tartrate (LOPRESSOR) 50 MG tablet Take 1 tablet (50 mg total) by mouth 2 (two) times daily. 60 tablet 0  . nicotine polacrilex (COMMIT) 4 MG lozenge Take 4 mg by mouth as needed for smoking cessation.    . rosuvastatin (CRESTOR) 10 MG tablet Take 1 tablet on Mon, Wed, Fri (Patient taking differently: Take 10 mg by mouth every Monday, Wednesday, and Friday. Take 1 tablet on Mon, Wed, Fri) 36 tablet 3  . doxycycline (VIBRA-TABS) 100 MG tablet Take 1 tablet (100 mg total) by mouth 2 (two) times daily. 14 tablet 0   No facility-administered medications prior to visit.  Radiology:   No results found.  Cardiac Studies:   Echocardiogram 12/04/2019:  1. Patient in afib during exam.  2. Left ventricular ejection fraction, by estimation, is 60 to 65%. The left ventricle has normal function. The left ventricle has no regional wall motion abnormalities. Left ventricular diastolic parameters are indeterminate.  3. Right ventricular systolic function is normal. The right ventricular size is normal.  4. Left atrial size was mildly dilated.  5. The mitral valve is degenerative. Trivial mitral valve regurgitation. No evidence of mitral stenosis.  6. The aortic valve was not well visualized. Aortic valve regurgitation is not visualized. Mild to  moderate aortic valve sclerosis/calcification is present, without any evidence of aortic stenosis.  7. The inferior vena cava is normal in size with greater than 50% respiratory variability, suggesting right atrial pressure of 3 mmHg.  EKG:   12/25/2019: Sinus bradycardia at rate of 48 bpm. Normal axis. Left atrial enlargement. PRWP, cannot exclude anterolateral infarct old. Non-specific T wave abnormality.   12/04/2019: Atrial fibrillation with rapid ventricular response at a rate of 107 bpm. Normal axis, left atrial. Enlargement.   Assessment     ICD-10-CM   1. Paroxysmal atrial fibrillation (HCC)  I48.0 EKG 12-Lead    PCV MYOCARDIAL PERFUSION WO LEXISCAN  2. Other fatigue  R53.83 PCV MYOCARDIAL PERFUSION WO LEXISCAN  3. Peripheral arterial disease (HCC)  I73.9 PCV ANKLE BRACHIAL INDEX (ABI)  4. Essential hypertension  I10 PCV MYOCARDIAL PERFUSION WO LEXISCAN    Basic metabolic panel  5. Hyperlipidemia associated with type 2 diabetes mellitus (HCC)  E11.69    E78.5   6. Type 2 diabetes mellitus without complication, without long-term current use of insulin (HCC)  E11.9 PCV ANKLE BRACHIAL INDEX (ABI)  7. Chronic obstructive pulmonary disease, unspecified COPD type ([place])  J44.9      Medications Discontinued During This Encounter  Medication Reason  . doxycycline (VIBRA-TABS) 100 MG tablet Completed Course    Meds ordered this encounter  Medications  . losartan (COZAAR) 100 MG tablet    Sig: Take 1 tablet (100 mg total) by mouth daily.    Dispense:  90 tablet    Refill:  3   This patients CHA2DS2-VASc Score 4 (HTN, DM, Age, PAD) and yearly risk of stroke 4.8%.  Recommendations:   John Van. is a 74 y.o. with history of hypertension, hyperlipidemia, type II diabetes mellitus. Also with history of covid 19 infection 11/2019, and diagnosed with atrial fibrillation 12/03/2019.   Patient presents for hospital follow-up of newly diagnosed atrial fibrillation.  Patient is  presently doing well and doing well without symptoms, and, and and EKG today revealed sinus bradycardia.  He is currently tolerating Eliquis without bleeding diathesis.  We will continue Eliquis, Lopressor, and rosuvastatin.  Suspect patient's episode of syncope to be related to atrial fibrillation as well as dehydration.  In regards to her atrial fibrillation is currently well rate controlled and he is in sinus rhythm.  However new onset atrial fibrillation warrants ischemic work-up also patient has multiple cardiovascular risk factors including diabetes, hypertension, hyperlipidemia.  Will obtain nuclear stress testing.  I personally have reviewed labs and work-up from the emergency department as well as patient's PCP.  Thyroid function is normal.  Echocardiogram done at the hospital did reveal mildly dilated left atrium, with normal left ventricular ejection fraction.  While in the hospital patient's blood pressure was low and he had an acute kidney injury, therefore losartan and HCT were stopped.  However  patient's blood pressure is elevated today, will resume losartan 100 mg once daily.  Will obtain BMP in 1 week.  On exam patient has findings clinically consistent with peripheral arterial disease with notably reduced peripheral pulses bilaterally.  Although patient is asymptomatic, suspect this is likely because he is not highly active on a regular basis.  Will obtain bilateral ABIs in order to further evaluate.  During this visit I reviewed and updated: Tobacco history  allergies medication reconciliation  medical history  surgical history  family history  social history.  This note was created using a voice recognition software as a result there may be grammatical errors inadvertently enclosed that do not reflect the nature of this encounter. Every attempt is made to correct such errors.   Alethia Berthold, PA-C 12/25/2019, 12:28 PM Office: [phone]

## 2019-12-27 ENCOUNTER — Other Ambulatory Visit: Payer: Self-pay

## 2019-12-27 ENCOUNTER — Ambulatory Visit: Payer: Medicare HMO

## 2019-12-27 DIAGNOSIS — I10 Essential (primary) hypertension: Secondary | ICD-10-CM

## 2019-12-27 DIAGNOSIS — R5383 Other fatigue: Secondary | ICD-10-CM | POA: Diagnosis not present

## 2019-12-27 DIAGNOSIS — I480 Paroxysmal atrial fibrillation: Secondary | ICD-10-CM | POA: Diagnosis not present

## 2019-12-29 ENCOUNTER — Other Ambulatory Visit: Payer: Self-pay

## 2019-12-29 ENCOUNTER — Ambulatory Visit: Payer: Medicare HMO

## 2019-12-29 DIAGNOSIS — E119 Type 2 diabetes mellitus without complications: Secondary | ICD-10-CM | POA: Diagnosis not present

## 2019-12-29 DIAGNOSIS — I739 Peripheral vascular disease, unspecified: Secondary | ICD-10-CM

## 2019-12-29 NOTE — Progress Notes (Signed)
Unable to reach patient. Left vm to cb.

## 2019-12-29 NOTE — Progress Notes (Signed)
Please inform patient his stress test is normal, no signs of blockages in his heart. Will discuss further at upcoming visit.

## 2019-12-29 NOTE — Progress Notes (Signed)
Called and spoke with patients wife regarding stress test results. Patients wife voiced understanding. AD/S

## 2020-01-01 NOTE — Progress Notes (Signed)
Spoke to patient's wife regarding results she will let patient know

## 2020-01-01 NOTE — Progress Notes (Signed)
Please inform patient that ABI reveals decreased blood flow in his legs. We will discuss further at his upcoming visit.

## 2020-01-05 ENCOUNTER — Telehealth: Payer: Self-pay

## 2020-01-05 ENCOUNTER — Other Ambulatory Visit: Payer: Self-pay

## 2020-01-05 ENCOUNTER — Telehealth: Payer: Self-pay | Admitting: Student

## 2020-01-05 MED ORDER — APIXABAN 5 MG PO TABS
5.0000 mg | ORAL_TABLET | Freq: Two times a day (BID) | ORAL | 1 refills | Status: DC
Start: 2020-01-05 — End: 2020-07-04

## 2020-01-05 MED ORDER — METOPROLOL TARTRATE 50 MG PO TABS
50.0000 mg | ORAL_TABLET | Freq: Two times a day (BID) | ORAL | 1 refills | Status: DC
Start: 2020-01-05 — End: 2020-02-01

## 2020-01-05 NOTE — Telephone Encounter (Signed)
Sent refill

## 2020-01-05 NOTE — Telephone Encounter (Signed)
Medication refill

## 2020-01-05 NOTE — Progress Notes (Signed)
Chronic Care Management Pharmacy Assistant   Name: John Woodward.  MRN: [id number] DOB: 11/27/1945  Reason for Encounter: Medication Review   PCP : Brunetta Jeans, PA-C  Allergies:   Allergies  Allergen Reactions  . Antibacterial Hand Soap [Triclosan] Other (See Comments)    Caused water blisters    Medications: Outpatient Encounter Medications as of 01/05/2020  Medication Sig  . acetaminophen (TYLENOL) 500 MG tablet Take 500-1,000 mg by mouth every 6 (six) hours as needed for mild pain (or headaches).   Marland Kitchen apixaban (ELIQUIS) 5 MG TABS tablet Take 1 tablet (5 mg total) by mouth 2 (two) times daily.  Marland Kitchen escitalopram (LEXAPRO) 10 MG tablet Take 1 tablet (10 mg total) by mouth daily.  Marland Kitchen losartan (COZAAR) 100 MG tablet Take 1 tablet (100 mg total) by mouth daily.  . metFORMIN (GLUCOPHAGE) 500 MG tablet TAKE 1 TABLET BY MOUTH DAILY IN THE MORNING AND 2 TABLETS DAILY IN THE EVENING (Patient taking differently: Take 500-1,000 mg by mouth See admin instructions. Take 500 mg by mouth in the morning and 1,000 mg at bedtime)  . metoprolol tartrate (LOPRESSOR) 50 MG tablet Take 1 tablet (50 mg total) by mouth 2 (two) times daily.  . nicotine polacrilex (COMMIT) 4 MG lozenge Take 4 mg by mouth as needed for smoking cessation.  . rosuvastatin (CRESTOR) 10 MG tablet Take 1 tablet on Mon, Wed, Fri (Patient taking differently: Take 10 mg by mouth every Monday, Wednesday, and Friday. Take 1 tablet on Mon, Wed, Fri)   No facility-administered encounter medications on file as of 01/05/2020.    Current Diagnosis: Patient Active Problem List   Diagnosis Date Noted  . A-fib ([place]) 12/03/2019  . Pulmonary emphysema ([place]) 09/22/2018  . Encounter for screening for malignant neoplasm of respiratory organs 02/07/2018  . Screening for AAA (abdominal aortic aneurysm) 02/07/2018  . Skin cancer screening 02/07/2018  . Need for 23-polyvalent pneumococcal polysaccharide vaccine 02/07/2018  .  Encounter for immunization 02/07/2018  . Visit for preventive health examination 02/07/2018  . Hx of colonic polyps 02/23/2017  . Hyperlipidemia associated with type 2 diabetes mellitus ([place]) 10/02/2016  . Diabetes mellitus type II, uncontrolled ([place]) 09/18/2016  . Encounter for Medicare annual wellness exam 09/16/2016  . Special screening examination for viral disease 09/16/2016  . Annual physical exam 09/16/2016  . Prostate cancer screening 09/16/2016  . Tobacco abuse disorder 08/11/2016  . Essential hypertension 08/11/2016   Reviewed chart for medication changes ahead of medication coordination call.  No OVs, Consults, or hospital visits since last care coordination call/Pharmacist visit. (If appropriate, list visit date, provider name)  No medication changes indicated OR if recent visit, treatment plan here.  BP Readings from Last 3 Encounters:  12/25/19 (!) 184/74  12/22/19 110/78  12/05/19 116/71    Lab Results  Component Value Date   HGBA1C 6.2 (H) 12/03/2019     Patient obtains medications through Vials  90 Days   Patient is due for next adherence delivery on: 01-05-20.  Called patient and reviewed medications and coordinated delivery.  This delivery to include: Eliquis 5 mg Tab Take 1 tab by mouth two times daily Metoprolol 50 mg Tab Take 1 tab by mouth 2 times daily Rosuvastatin 10 mg Tablet Take one tablet mon, wed and friday   Coordinated acute fill for 01-05-20:    Eliquis 5mg  Tab Take 1 tab by mouth two times daily (requested) Metoprolol 50mg  Tab Take 1 tab by mouth 2 times daily (  requested) Rosuvastatin 10mg  Tablet Take one tablet mon, wed and friday   Confirmed delivery date of 01-05-20, advised patient that pharmacy will contact them the morning of delivery.  Georgiana Shore ,[place] Pharmacist Assistant [phone]  Follow-Up:  Pharmacist Review

## 2020-01-25 ENCOUNTER — Ambulatory Visit: Payer: Medicare HMO | Admitting: Student

## 2020-01-29 ENCOUNTER — Ambulatory Visit: Payer: Medicare HMO | Admitting: Student

## 2020-01-31 NOTE — Progress Notes (Signed)
Primary Physician/Referring:  Waldon Merl, PA-C  Patient ID: John Woodward., male    DOB: [birth year], 75 y.o.   MRN: [id number]  Chief Complaint  Patient presents with  . Atrial Fibrillation  . Hypertension  . Results  . Follow-up    4 weeks   HPI:    John Woodward.  is a 75 y.o. with history of COPD, hypertension, hyperlipidemia, type II diabetes mellitus non-insulin dependent. He presented to emergency department 12/03/2019 following a syncopal episode at home. Upon admission patient went into atrial fibrillation with RVR and was found to be positive for Covid 19. Patient was started on Eliquis for anticoagulation.    Patient presents for 6-week follow-up of hypertension and new onset atrial fibrillation diagnosed 12/03/2019.  At last visit resume losartan 100 mg daily, notably patient has also been on hydrochlorothiazide in the past.  Patient reports that since last visit he has been feeling well, without recurrence of syncope or near syncope.  Denies chest pain, palpitations, dyspnea, orthopnea, PND, leg swelling, dizziness, claudication.  Unfortunately he does continue to smoke, but is working on quitting.  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  He is tolerating Eliquis without bleeding diathesis.  Patient does not monitor his blood pressure at home on a regular basis.   Past Medical History:  Diagnosis Date  . Arthritis   . Closed fracture dislocation of right elbow    30 years ago -- fell out of theback of a truck  . Diabetes mellitus without complication (HCC)   . Hx of colonic polyps 02/23/2017  . Hyperlipidemia   . Hypertension    Diagnosed 10 years ago. No on medication. Denies history of CAD.    Past Surgical History:  Procedure Laterality Date  . OLECRANON BURSECTOMY Right 08/18/2016   Procedure: OLECRANON BURSECTOMY;  Surgeon: Cammy Copa, MD;  Location: [facility] OR;  Service: Orthopedics;   Laterality: Right;   Family History  Problem Relation Age of Onset  . Hypertension Mother   . Dementia Mother        84s  . Transient ischemic attack Mother        2s  . Kidney disease Father   . COPD Father   . Hypertension Brother   . Alcohol abuse Maternal Grandfather   . Mental illness Paternal Grandmother   . Hypertension Daughter   . Healthy Son   . Learning disabilities Son   . Lung cancer Maternal Aunt   . Colon cancer Neg Hx   . Colon polyps Neg Hx   . Esophageal cancer Neg Hx   . Rectal cancer Neg Hx   . Stomach cancer Neg Hx     Social History   Tobacco Use  . Smoking status: Current Every Day Smoker    Packs/day: 0.25    Years: 50.00    Pack years: 12.50    Types: Cigarettes    Start date: 08/12/1966  . Smokeless tobacco: Never Used  . Tobacco comment: Taking Nicotine lozenges  Substance Use Topics  . Alcohol use: No   Marital Status: Married   ROS  Review of Systems  Constitutional: Negative for malaise/fatigue and weight gain.  Cardiovascular: Negative for chest pain, claudication, leg swelling, near-syncope, orthopnea, palpitations, paroxysmal nocturnal dyspnea and syncope.  Respiratory: Negative for shortness of breath.   Hematologic/Lymphatic: Does not bruise/bleed easily.  Gastrointestinal: Negative for melena.  Neurological: Negative for dizziness  and weakness.    Objective  Blood pressure (!) 182/90, pulse (!) 50, resp. rate 16, height 5\' 11"  (1.803 m), weight 154 lb (69.9 kg), SpO2 97 %.  Vitals with BMI 02/01/2020 02/01/2020 12/25/2019  Height - 5\' 11"  -  Weight - 154 lbs -  BMI - 21.49 -  Systolic 182 195 454  Diastolic 90 91 74  Pulse 50 50 48      Physical Exam Vitals reviewed.  HENT:     Head: Normocephalic and atraumatic.  Cardiovascular:     Rate and Rhythm: Regular rhythm. Bradycardia present.     Pulses: No decreased pulses.          Carotid pulses are 2+ on the right side and 2+ on the left side.      Radial pulses are 2+  on the right side and 2+ on the left side.       Femoral pulses are 1+ on the right side and 1+ on the left side.      Popliteal pulses are 1+ on the right side and 1+ on the left side.       Dorsalis pedis pulses are 0 on the right side and 0 on the left side.       Posterior tibial pulses are 1+ on the right side and 0 on the left side.     Heart sounds: S1 normal and S2 normal. No murmur heard. No gallop.      Comments: Venous stasis skin changes bilateral lower legs and feet, left more than right. No JVD, no leg edema.   Left foot cool to touch compared to right foot.  Pulmonary:     Effort: Pulmonary effort is normal. No respiratory distress.     Breath sounds: No wheezing, rhonchi or rales.  Musculoskeletal:     Right lower leg: No edema.     Left lower leg: No edema.  Neurological:     General: No focal deficit present.     Mental Status: He is alert and oriented to person, place, and time.     Motor: No weakness.    Laboratory examination:   Recent Labs    12/03/19 1228 12/04/19 0329 12/05/19 0127 12/14/19 1054  NA 138 137 137 140  K 4.4 3.5 5.1 4.3  CL 104 104 102 105  CO2 24 22 24 27   GLUCOSE 149* 129* 105* 150*  BUN 32* 31* 31* 21  CREATININE 1.83* 1.47* 1.61* 1.23  CALCIUM 8.3* 8.3* 8.5* 8.6  GFRNONAA 38* 50* 45*  --    CrCl cannot be calculated (Patient's most recent lab result is older than the maximum 21 days allowed.).  CMP Latest Ref Rng & Units 12/14/2019 12/05/2019 12/04/2019  Glucose 70 - 99 mg/dL 098(J) 191(Y) 782(N)  BUN 6 - 23 mg/dL 21 56(O) 13(Y)  Creatinine 0.40 - 1.50 mg/dL 8.65 7.84(O) 9.62(X)  Sodium 135 - 145 mEq/L 140 137 137  Potassium 3.5 - 5.1 mEq/L 4.3 5.1 3.5  Chloride 96 - 112 mEq/L 105 102 104  CO2 19 - 32 mEq/L 27 24 22   Calcium 8.4 - 10.5 mg/dL 8.6 5.2(W) 8.3(L)  Total Protein 6.5 - 8.1 g/dL - 6.3(L) 6.2(L)  Total Bilirubin 0.3 - 1.2 mg/dL - 1.0 0.7  Alkaline Phos 38 - 126 U/L - 55 55  AST 15 - 41 U/L - 27 22  ALT 0 - 44 U/L -  22 18   CBC Latest Ref Rng & Units 12/14/2019 12/05/2019 12/04/2019  WBC 4.0 - 10.5 K/uL 7.7 3.5(L) 4.9  Hemoglobin 13.0 - 17.0 g/dL 82.9 56.2 13.0  Hematocrit 39.0 - 52.0 % 41.5 48.9 44.4  Platelets 150.0 - 400.0 K/uL 399.0 137(L) 143(L)    Lipid Panel Recent Labs    08/24/19 0840 12/04/19 0329  CHOL 216* 158  TRIG 212.0* 129  LDLCALC  --  101*  VLDL 42.4* 26  HDL 37.70* 31*  CHOLHDL 6 5.1  LDLDIRECT 139.0  --     HEMOGLOBIN A1C Lab Results  Component Value Date   HGBA1C 6.2 (H) 12/03/2019   MPG 131.24 12/03/2019   TSH Recent Labs    12/03/19 1228 12/04/19 0329  TSH 2.605 0.973    External labs:  None   Medications and allergies   Allergies  Allergen Reactions  . Antibacterial Hand Soap [Triclosan] Other (See Comments)    Caused water blisters     Outpatient Medications Prior to Visit  Medication Sig Dispense Refill  . apixaban (ELIQUIS) 5 MG TABS tablet Take 1 tablet (5 mg total) by mouth 2 (two) times daily. 180 tablet 1  . escitalopram (LEXAPRO) 10 MG tablet Take 1 tablet (10 mg total) by mouth daily. 90 tablet 0  . losartan (COZAAR) 100 MG tablet Take 1 tablet (100 mg total) by mouth daily. 90 tablet 3  . metFORMIN (GLUCOPHAGE) 500 MG tablet TAKE 1 TABLET BY MOUTH DAILY IN THE MORNING AND 2 TABLETS DAILY IN THE EVENING (Patient taking differently: Take 500-1,000 mg by mouth See admin instructions. Take 500 mg by mouth in the morning and 1,000 mg at bedtime) 270 tablet 1  . nicotine polacrilex (COMMIT) 4 MG lozenge Take 4 mg by mouth as needed for smoking cessation.    . metoprolol tartrate (LOPRESSOR) 50 MG tablet Take 1 tablet (50 mg total) by mouth 2 (two) times daily. 180 tablet 1  . rosuvastatin (CRESTOR) 10 MG tablet Take 1 tablet on Mon, Wed, Fri (Patient taking differently: Take 10 mg by mouth every Monday, Wednesday, and Friday. Take 1 tablet on Mon, Wed, Fri) 36 tablet 3  . acetaminophen (TYLENOL) 500 MG tablet Take 500-1,000 mg by mouth every 6  (six) hours as needed for mild pain (or headaches).      No facility-administered medications prior to visit.     Radiology:   No results found.  Cardiac Studies:   ABI 12/29/2019:  This exam reveals moderately decreased perfusion of the right lower extremity, noted at the post tibial artery level (ABI 0.51) and severely decreased perfusion of the left lower extremity, noted at the dorsalis pedis and post tibial artery level (ABI 0.32).  Right AT appears to be occluded with dampened waveform. There is severely abnormal monophasic waveform of all vessels at the level of the ankle.  PCV MYOCARDIAL PERFUSION WO LEXISCAN 12/27/2019 Nondiagnostic ECG stress due to pharmacologic stress. Resting EKG demonstrated normal sinus rhythm. Non-specific T-wave abnormalities. Peak EKG revealed no significant ST-T changes. Myocardial perfusion is normal. Overall LV systolic function is normal without regional wall motion abnormalities. Stress LV EF: 58%. No previous exam available for comparison. Low risk.  Echocardiogram 12/04/2019:  1. Patient in afib during exam.  2. Left ventricular ejection fraction, by estimation, is 60 to 65%. The left ventricle has normal function. The left ventricle has no regional wall motion abnormalities. Left ventricular diastolic parameters are indeterminate.  3. Right ventricular systolic function is normal. The right ventricular size is normal.  4. Left atrial size was mildly dilated.  5.  The mitral valve is degenerative. Trivial mitral valve regurgitation. No evidence of mitral stenosis.  6. The aortic valve was not well visualized. Aortic valve regurgitation is not visualized. Mild to moderate aortic valve sclerosis/calcification is present, without any evidence of aortic stenosis.  7. The inferior vena cava is normal in size with greater than 50% respiratory variability, suggesting right atrial pressure of 3 mmHg.  EKG:   02/01/2020: Marked sinus bradycardia  rate of 47 bpm, left atrial enlargement.  Normal axis.  Poor R wave progression, cannot exclude anteroseptal infarct old.  12/25/2019: Sinus bradycardia at rate of 48 bpm. Normal axis. Left atrial enlargement. PRWP, cannot exclude anterolateral infarct old. Non-specific T wave abnormality.   12/04/2019: Atrial fibrillation with rapid ventricular response at a rate of 107 bpm. Normal axis, left atrial. Enlargement.   Assessment     ICD-10-CM   1. Paroxysmal atrial fibrillation (HCC)  I48.0 EKG 12-Lead  2. Peripheral arterial disease (HCC)  I73.9   3. Essential hypertension  I10   4. Hyperlipidemia associated with type 2 diabetes mellitus (HCC)  E11.69 rosuvastatin (CRESTOR) 10 MG tablet   E78.5      Medications Discontinued During This Encounter  Medication Reason  . acetaminophen (TYLENOL) 500 MG tablet Patient Preference  . rosuvastatin (CRESTOR) 10 MG tablet   . metoprolol tartrate (LOPRESSOR) 50 MG tablet     Meds ordered this encounter  Medications  . amLODipine (NORVASC) 5 MG tablet    Sig: Take 1 tablet (5 mg total) by mouth daily.    Dispense:  30 tablet    Refill:  3  . metoprolol tartrate (LOPRESSOR) 25 MG tablet    Sig: Take 1 tablet (25 mg total) by mouth 2 (two) times daily.    Dispense:  60 tablet    Refill:  3  . rosuvastatin (CRESTOR) 10 MG tablet    Sig: Take 1 tablet (10 mg total) by mouth daily.    Dispense:  30 tablet    Refill:  3   This patients CHA2DS2-VASc Score 4 (HTN, DM, Age, PAD) and yearly risk of stroke 4.8%.  Recommendations:   Bash Eisenberg. is a 75 y.o. with history of hypertension, hyperlipidemia, type II diabetes mellitus. Also with history of covid 19 infection 11/2019, and diagnosed with atrial fibrillation 12/03/2019.   Patient presents for 6-week follow-up of hypertension and atrial fibrillation.  Patient's blood pressure is markedly elevated in the office today however EKG also reveals marked bradycardia at rate of 47 bpm.  Suspect  underlying bradycardia is likely contributing to elevated blood pressure.  Although patient remained asymptomatic and there are no clinical signs of heart failure.  Will reduce metoprolol titrate from 50 mg to 25 mg twice daily in hopes that increasing heart rate will improve blood pressure control.  We will also add amlodipine 5 mg daily for hypertension.  Counseled patient that his blood pressure is markedly elevated currently which puts him at increased risk for stroke.  Discussed with patient and his wife signs and symptoms that would warrant urgent evaluation in the emergency department, both verbalized understanding and agreement.  In regards to atrial fibrillation, patient is presently maintaining sinus rhythm and tolerating anticoagulation without bleeding diathesis.  We will continue Eliquis and metoprolol at a reduced dose of 25 mg twice daily.  Reviewed and discussed results of stress test and ABI with patient.  Informed patient stress test revealed normal myocardial perfusion and stress LVEF of 58%.  Informed patient ankle-brachial index  results moderately decreased perfusion of the right lower extremity and severely decreased perfusion of the left lower extremity.  As patient is presently asymptomatic without claudication advised him regarding graduated exercise.  Patient is without rest pain or ulceration, therefore advised him to notify the office if he experiences worsening symptoms.  In view of patient's underlying peripheral arterial disease recommend aggressive lipid control.  LDL remained elevated at last check on 12/04/2019, therefore will increase rosuvastatin from 10 mg 3 days/week to 10 mg daily.  Patient will notify our office if he experiences side effects of increased risk of rosuvastatin dosing.  Recommend close follow-up in 2 weeks for hypertension, bradycardia, and repeat blood work.

## 2020-02-01 ENCOUNTER — Ambulatory Visit: Payer: Medicare HMO | Admitting: Student

## 2020-02-01 ENCOUNTER — Encounter: Payer: Self-pay | Admitting: Student

## 2020-02-01 ENCOUNTER — Other Ambulatory Visit: Payer: Self-pay

## 2020-02-01 VITALS — BP 182/90 | HR 50 | Resp 16 | Ht 71.0 in | Wt 154.0 lb

## 2020-02-01 DIAGNOSIS — E785 Hyperlipidemia, unspecified: Secondary | ICD-10-CM | POA: Diagnosis not present

## 2020-02-01 DIAGNOSIS — I480 Paroxysmal atrial fibrillation: Secondary | ICD-10-CM

## 2020-02-01 DIAGNOSIS — E1169 Type 2 diabetes mellitus with other specified complication: Secondary | ICD-10-CM | POA: Diagnosis not present

## 2020-02-01 DIAGNOSIS — I10 Essential (primary) hypertension: Secondary | ICD-10-CM

## 2020-02-01 DIAGNOSIS — I739 Peripheral vascular disease, unspecified: Secondary | ICD-10-CM | POA: Diagnosis not present

## 2020-02-01 MED ORDER — METOPROLOL TARTRATE 25 MG PO TABS
25.0000 mg | ORAL_TABLET | Freq: Two times a day (BID) | ORAL | 3 refills | Status: DC
Start: 2020-02-01 — End: 2020-02-16

## 2020-02-01 MED ORDER — ROSUVASTATIN CALCIUM 10 MG PO TABS: 10.0000 mg | ORAL_TABLET | Freq: Every day | ORAL | 3 refills | Status: DC

## 2020-02-01 MED ORDER — AMLODIPINE BESYLATE 5 MG PO TABS: 5.0000 mg | ORAL_TABLET | Freq: Every day | ORAL | 3 refills | Status: DC

## 2020-02-02 ENCOUNTER — Other Ambulatory Visit: Payer: Self-pay | Admitting: Emergency Medicine

## 2020-02-02 ENCOUNTER — Other Ambulatory Visit (INDEPENDENT_AMBULATORY_CARE_PROVIDER_SITE_OTHER): Payer: Medicare HMO

## 2020-02-02 ENCOUNTER — Telehealth: Payer: Self-pay | Admitting: Emergency Medicine

## 2020-02-02 DIAGNOSIS — I10 Essential (primary) hypertension: Secondary | ICD-10-CM | POA: Diagnosis not present

## 2020-02-02 NOTE — Telephone Encounter (Signed)
Patient scheduled for a lab appointment today for labs ordered by Cardiology Dr Rayetta Pigg. Patient was seen on 12/25/19 and labs was ordered but patient did not get drawn.  Patient was seen yesterday by Cardiology and medication changes were made. Advised to get labs drawn to check kidney functions on medications. PCP is out of the office, Dr Birdie Riddle ok BMP order and to fax results to Dr Rayetta Pigg. Order placed.

## 2020-02-02 NOTE — Addendum Note (Signed)
Addended by: Jacob Moores on: 02/02/2020 11:09 AM   Modules accepted: Orders

## 2020-02-03 LAB — BASIC METABOLIC PANEL
BUN: 22 mg/dL (ref 7–25)
CO2: 31 mmol/L (ref 20–32)
Calcium: 9.7 mg/dL (ref 8.6–10.3)
Chloride: 106 mmol/L (ref 98–110)
Creat: 1.16 mg/dL (ref 0.70–1.18)
Glucose, Bld: 105 mg/dL — ABNORMAL HIGH (ref 65–99)
Potassium: 5.4 mmol/L — ABNORMAL HIGH (ref 3.5–5.3)
Sodium: 143 mmol/L (ref 135–146)

## 2020-02-05 ENCOUNTER — Ambulatory Visit (INDEPENDENT_AMBULATORY_CARE_PROVIDER_SITE_OTHER): Payer: Medicare HMO | Admitting: Physician Assistant

## 2020-02-05 ENCOUNTER — Encounter: Payer: Self-pay | Admitting: Physician Assistant

## 2020-02-05 ENCOUNTER — Other Ambulatory Visit: Payer: Self-pay | Admitting: Physician Assistant

## 2020-02-05 ENCOUNTER — Other Ambulatory Visit: Payer: Self-pay

## 2020-02-05 ENCOUNTER — Telehealth: Payer: Self-pay

## 2020-02-05 VITALS — BP 140/92 | HR 54 | Temp 98.3°F | Resp 16 | Ht 71.0 in | Wt 151.0 lb

## 2020-02-05 DIAGNOSIS — I10 Essential (primary) hypertension: Secondary | ICD-10-CM

## 2020-02-05 DIAGNOSIS — E1165 Type 2 diabetes mellitus with hyperglycemia: Secondary | ICD-10-CM

## 2020-02-05 NOTE — Patient Instructions (Signed)
Please keep hydrated. Eat a low-salt diet (see below). Make sure to increase dietary fiber intake.   Continue checking home BP measurements, getting a new arm cuff as recommended and record daily so we can monitor BP for continued improvement.  Follow-up with Cardiology as scheduled.    [email or web address]">  DASH Eating Plan DASH stands for Dietary Approaches to Stop Hypertension. The DASH eating plan is a healthy eating plan that has been shown to:  Reduce high blood pressure (hypertension).  Reduce your risk for type 2 diabetes, heart disease, and stroke.  Help with weight loss. What are tips for following this plan? Reading food labels  Check food labels for the amount of salt (sodium) per serving. Choose foods with less than 5 percent of the Daily Value of sodium. Generally, foods with less than 300 milligrams (mg) of sodium per serving fit into this eating plan.  To find whole grains, look for the word "whole" as the first word in the ingredient list. Shopping  Buy products labeled as "low-sodium" or "no salt added."  Buy fresh foods. Avoid canned foods and pre-made or frozen meals. Cooking  Avoid adding salt when cooking. Use salt-free seasonings or herbs instead of table salt or sea salt. Check with your health care provider or pharmacist before using salt substitutes.  Do not fry foods. Cook foods using healthy methods such as baking, boiling, grilling, roasting, and broiling instead.  Cook with heart-healthy oils, such as olive, canola, avocado, soybean, or sunflower oil. Meal planning  Eat a balanced diet that includes: ? 4 or more servings of fruits and 4 or more servings of vegetables each day. Try to fill one-half of your plate with fruits and vegetables. ? 6-8 servings of whole grains each day. ? Less than 6 oz (170 g) of lean meat, poultry, or fish each day. A 3-oz (85-g) serving of meat is about the same size as a  deck of cards. One egg equals 1 oz (28 g). ? 2-3 servings of low-fat dairy each day. One serving is 1 cup (237 mL). ? 1 serving of nuts, seeds, or beans 5 times each week. ? 2-3 servings of heart-healthy fats. Healthy fats called omega-3 fatty acids are found in foods such as walnuts, flaxseeds, fortified milks, and eggs. These fats are also found in cold-water fish, such as sardines, salmon, and mackerel.  Limit how much you eat of: ? Canned or prepackaged foods. ? Food that is high in trans fat, such as some fried foods. ? Food that is high in saturated fat, such as fatty meat. ? Desserts and other sweets, sugary drinks, and other foods with added sugar. ? Full-fat dairy products.  Do not salt foods before eating.  Do not eat more than 4 egg yolks a week.  Try to eat at least 2 vegetarian meals a week.  Eat more home-cooked food and less restaurant, buffet, and fast food.   Lifestyle  When eating at a restaurant, ask that your food be prepared with less salt or no salt, if possible.  If you drink alcohol: ? Limit how much you use to:  0-1 drink a day for women who are not pregnant.  0-2 drinks a day for men. ? Be aware of how much alcohol is in your drink. In the [place] one drink equals one 12 oz bottle of beer (355 mL), one 5 oz glass of wine (148 mL), or one 1 oz glass of hard liquor (44 mL). General information  Avoid  eating more than 2,300 mg of salt a day. If you have hypertension, you may need to reduce your sodium intake to 1,500 mg a day.  Work with your health care provider to maintain a healthy body weight or to lose weight. Ask what an ideal weight is for you.  Get at least 30 minutes of exercise that causes your heart to beat faster (aerobic exercise) most days of the week. Activities may include walking, swimming, or biking.  Work with your health care provider or dietitian to adjust your eating plan to your individual calorie needs. What foods should I  eat? Fruits All fresh, dried, or frozen fruit. Canned fruit in natural juice (without added sugar). Vegetables Fresh or frozen vegetables (raw, steamed, roasted, or grilled). Low-sodium or reduced-sodium tomato and vegetable juice. Low-sodium or reduced-sodium tomato sauce and tomato paste. Low-sodium or reduced-sodium canned vegetables. Grains Whole-grain or whole-wheat bread. Whole-grain or whole-wheat pasta. Brown rice. [place]. Bulgur. Whole-grain and low-sodium cereals. Pita bread. Low-fat, low-sodium crackers. Whole-wheat flour tortillas. Meats and other proteins Skinless chicken or [place]. Ground chicken or [place]. Pork with fat trimmed off. Fish and seafood. Egg whites. Dried beans, peas, or lentils. Unsalted nuts, nut butters, and seeds. Unsalted canned beans. Lean cuts of beef with fat trimmed off. Low-sodium, lean precooked or cured meat, such as sausages or meat loaves. Dairy Low-fat (1%) or fat-free (skim) milk. Reduced-fat, low-fat, or fat-free cheeses. Nonfat, low-sodium ricotta or cottage cheese. Low-fat or nonfat yogurt. Low-fat, low-sodium cheese. Fats and oils Soft margarine without trans fats. Vegetable oil. Reduced-fat, low-fat, or light mayonnaise and salad dressings (reduced-sodium). Canola, safflower, olive, avocado, soybean, and sunflower oils. Avocado. Seasonings and condiments Herbs. Spices. Seasoning mixes without salt. Other foods Unsalted popcorn and pretzels. Fat-free sweets. The items listed above may not be a complete list of foods and beverages you can eat. Contact a dietitian for more information. What foods should I avoid? Fruits Canned fruit in a light or heavy syrup. Fried fruit. Fruit in cream or butter sauce. Vegetables Creamed or fried vegetables. Vegetables in a cheese sauce. Regular canned vegetables (not low-sodium or reduced-sodium). Regular canned tomato sauce and paste (not low-sodium or reduced-sodium). Regular tomato and vegetable juice  (not low-sodium or reduced-sodium). Angie Fava. Olives. Grains Baked goods made with fat, such as croissants, muffins, or some breads. Dry pasta or rice meal packs. Meats and other proteins Fatty cuts of meat. Ribs. Fried meat. Berniece Salines. Bologna, salami, and other precooked or cured meats, such as sausages or meat loaves. Fat from the back of a pig (fatback). Bratwurst. Salted nuts and seeds. Canned beans with added salt. Canned or smoked fish. Whole eggs or egg yolks. Chicken or [place] with skin. Dairy Whole or 2% milk, cream, and half-and-half. Whole or full-fat cream cheese. Whole-fat or sweetened yogurt. Full-fat cheese. Nondairy creamers. Whipped toppings. Processed cheese and cheese spreads. Fats and oils Butter. Stick margarine. Lard. Shortening. Ghee. Bacon fat. Tropical oils, such as coconut, palm kernel, or palm oil. Seasonings and condiments Onion salt, garlic salt, seasoned salt, table salt, and sea salt. Worcestershire sauce. Tartar sauce. Barbecue sauce. Teriyaki sauce. Soy sauce, including reduced-sodium. Steak sauce. Canned and packaged gravies. Fish sauce. Oyster sauce. Cocktail sauce. Store-bought horseradish. Ketchup. Mustard. Meat flavorings and tenderizers. Bouillon cubes. Hot sauces. Pre-made or packaged marinades. Pre-made or packaged taco seasonings. Relishes. Regular salad dressings. Other foods Salted popcorn and pretzels. The items listed above may not be a complete list of foods and beverages you should avoid. Contact a  dietitian for more information. Where to find more information  National Heart, Lung, and Blood Institute: https://wilson-eaton.com/  American Heart Association: www.heart.org  Academy of Nutrition and Dietetics: www.eatright.[place]: www.kidney.org Summary  The DASH eating plan is a healthy eating plan that has been shown to reduce high blood pressure (hypertension). It may also reduce your risk for type 2 diabetes, heart disease, and  stroke.  When on the DASH eating plan, aim to eat more fresh fruits and vegetables, whole grains, lean proteins, low-fat dairy, and heart-healthy fats.  With the DASH eating plan, you should limit salt (sodium) intake to 2,300 mg a day. If you have hypertension, you may need to reduce your sodium intake to 1,500 mg a day.  Work with your health care provider or dietitian to adjust your eating plan to your individual calorie needs. This information is not intended to replace advice given to you by your health care provider. Make sure you discuss any questions you have with your health care provider. Document Revised: 12/16/2018 Document Reviewed: 12/16/2018 Elsevier Patient Education  2021 [employer].

## 2020-02-05 NOTE — Telephone Encounter (Signed)
Pt wife called and his BP over the weekend was 265/168, 192/123, and today it is 261/153 They have appt with Primary care tomorrow.

## 2020-02-05 NOTE — Telephone Encounter (Signed)
Please advise patient to increase amlodipine to 10 mg daily, starting today. Also please ask patient if he is experiencing any symptoms? Also ask of he has heart rate readings available and send them to me if he does. Thanks!

## 2020-02-05 NOTE — Progress Notes (Signed)
Patient presents to clinic today for further assessment of significantly elevated home blood pressure measurements.  Patient and wife note several readings at home with systolic greater than 789.  Patient notes he has been asymptomatic, denying any chest pain, racing heart, headaches, vision changes, lightheadedness or dizziness.  Endorses taking all of his medications as directed.  Wife notes that he did call his cardiologist this morning who recommended increasing his amlodipine to 10 mg daily.  Notes he did take an additional 5 this morning.  States he feels fine right now but wanted to be evaluated.  Past Medical History:  Diagnosis Date  . Arthritis   . Closed fracture dislocation of right elbow    30 years ago -- fell out of theback of a truck  . Diabetes mellitus without complication ([place])   . Hx of colonic polyps 02/23/2017  . Hyperlipidemia   . Hypertension    Diagnosed 10 years ago. No on medication. Denies history of CAD.     Current Outpatient Medications on File Prior to Visit  Medication Sig Dispense Refill  . amLODipine (NORVASC) 10 MG tablet Take 10 mg by mouth daily.    Marland Kitchen apixaban (ELIQUIS) 5 MG TABS tablet Take 1 tablet (5 mg total) by mouth 2 (two) times daily. 180 tablet 1  . escitalopram (LEXAPRO) 10 MG tablet Take 1 tablet (10 mg total) by mouth daily. 90 tablet 0  . losartan (COZAAR) 100 MG tablet Take 1 tablet (100 mg total) by mouth daily. 90 tablet 3  . metFORMIN (GLUCOPHAGE) 500 MG tablet TAKE ONE TABLET BY MOUTH EVERY MORNING and TAKE TWO TABLETS BY MOUTH EVERY EVENING 90 tablet 0  . metoprolol tartrate (LOPRESSOR) 25 MG tablet Take 1 tablet (25 mg total) by mouth 2 (two) times daily. 60 tablet 3  . nicotine polacrilex (COMMIT) 4 MG lozenge Take 4 mg by mouth as needed for smoking cessation.    . rosuvastatin (CRESTOR) 10 MG tablet Take 1 tablet (10 mg total) by mouth daily. 30 tablet 3   No current facility-administered medications on file prior to visit.     Allergies  Allergen Reactions  . Antibacterial Hand Soap [Triclosan] Other (See Comments)    Caused water blisters    Family History  Problem Relation Age of Onset  . Hypertension Mother   . Dementia Mother        70s  . Transient ischemic attack Mother        57s  . Kidney disease Father   . COPD Father   . Hypertension Brother   . Alcohol abuse Maternal Grandfather   . Mental illness Paternal Grandmother   . Hypertension Daughter   . Healthy Son   . Learning disabilities Son   . Lung cancer Maternal Aunt   . Colon cancer Neg Hx   . Colon polyps Neg Hx   . Esophageal cancer Neg Hx   . Rectal cancer Neg Hx   . Stomach cancer Neg Hx    Social History   Socioeconomic History  . Marital status: Married    Spouse name: Not on file  . Number of children: 3  . Years of education: Not on file  . Highest education level: 12th grade  Occupational History  . Occupation: Retired    Comment: Dealer  Tobacco Use  . Smoking status: Current Every Day Smoker    Packs/day: 0.25    Years: 50.00    Pack years: 12.50    Types: Cigarettes  Start date: 08/12/1966  . Smokeless tobacco: Never Used  . Tobacco comment: Taking Nicotine lozenges  Vaping Use  . Vaping Use: Never used  Substance and Sexual Activity  . Alcohol use: No  . Drug use: No  . Sexual activity: Yes  Other Topics Concern  . Not on file  Social History Narrative   Social Review      Patient lives at home with. Spouse Marthenia RollingVirgina Provencher   Pt lives in a one or two story home? One story home   Does patient lives in a facility? If so where? Private home   What is patient highest level of education? 12 grade   Pt is RIGHT handed   Social Determinants of Health   Financial Resource Strain: Not on file  Food Insecurity: No Food Insecurity  . Worried About Programme researcher, broadcasting/film/videounning Out of Food in the Last Year: Never true  . Ran Out of Food in the Last Year: Never true  Transportation Needs: No Transportation Needs  . Lack  of Transportation (Medical): No  . Lack of Transportation (Non-Medical): No  Physical Activity: Not on file  Stress: Not on file  Social Connections: Not on file   Review of Systems - See HPI.  All other ROS are negative.  BP (!) 140/92   Pulse (!) 54   Temp 98.3 F (36.8 C) (Temporal)   Resp 16   Ht 5\' 11"  (1.803 m)   Wt 151 lb (68.5 kg)   SpO2 94%   BMI 21.06 kg/m   Physical Exam Vitals reviewed.  Constitutional:      Appearance: Normal appearance.  HENT:     Head: Normocephalic and atraumatic.  Cardiovascular:     Rate and Rhythm: Normal rate and regular rhythm.     Pulses: Normal pulses.     Heart sounds: Normal heart sounds.  Pulmonary:     Effort: Pulmonary effort is normal.     Breath sounds: Normal breath sounds.  Musculoskeletal:     Cervical back: Neck supple.  Neurological:     General: No focal deficit present.     Mental Status: He is alert and oriented to person, place, and time.  Psychiatric:        Mood and Affect: Mood normal.     Recent Results (from the past 2160 hour(s))  CBG monitoring, ED     Status: Abnormal   Collection Time: 12/03/19 12:17 PM  Result Value Ref Range   Glucose-Capillary 123 (H) 70 - 99 mg/dL    Comment: Glucose reference range applies only to samples taken after fasting for at least 8 hours.   Comment 1 Notify RN    Comment 2 Document in Chart   CBC with Differential     Status: Abnormal   Collection Time: 12/03/19 12:28 PM  Result Value Ref Range   WBC 4.6 4.0 - 10.5 K/uL   RBC 4.97 4.22 - 5.81 MIL/uL   Hemoglobin 16.1 13.0 - 17.0 g/dL   HCT 16.148.2 09.639.0 - 04.552.0 %   MCV 97.0 80.0 - 100.0 fL   MCH 32.4 26.0 - 34.0 pg   MCHC 33.4 30.0 - 36.0 g/dL   RDW 40.913.2 81.111.5 - 91.415.5 %   Platelets 142 (L) 150 - 400 K/uL   nRBC 0.0 0.0 - 0.2 %   Neutrophils Relative % 70 %   Neutro Abs 3.2 1.7 - 7.7 K/uL   Lymphocytes Relative 22 %   Lymphs Abs 1.0 0.7 - 4.0 K/uL  Monocytes Relative 8 %   Monocytes Absolute 0.4 0.1 - 1.0 K/uL    Eosinophils Relative 0 %   Eosinophils Absolute 0.0 0.0 - 0.5 K/uL   Basophils Relative 0 %   Basophils Absolute 0.0 0.0 - 0.1 K/uL   Immature Granulocytes 0 %   Abs Immature Granulocytes 0.01 0.00 - 0.07 K/uL    Comment: Performed at [facility], [place] [address], [place], [place] [address]  Comprehensive metabolic panel     Status: Abnormal   Collection Time: 12/03/19 12:28 PM  Result Value Ref Range   Sodium 138 135 - 145 mmol/L   Potassium 4.4 3.5 - 5.1 mmol/L   Chloride 104 98 - 111 mmol/L   CO2 24 22 - 32 mmol/L   Glucose, Bld 149 (H) 70 - 99 mg/dL    Comment: Glucose reference range applies only to samples taken after fasting for at least 8 hours.   BUN 32 (H) 8 - 23 mg/dL   Creatinine, Ser 1.83 (H) 0.61 - 1.24 mg/dL   Calcium 8.3 (L) 8.9 - 10.3 mg/dL   Total Protein 6.2 (L) 6.5 - 8.1 g/dL   Albumin 3.2 (L) 3.5 - 5.0 g/dL   AST 25 15 - 41 U/L   ALT 17 0 - 44 U/L   Alkaline Phosphatase 54 38 - 126 U/L   Total Bilirubin 1.0 0.3 - 1.2 mg/dL   GFR, Estimated 38 (L) >60 mL/min    Comment: (NOTE) Calculated using the CKD-EPI Creatinine Equation (2021)    Anion gap 10 5 - 15    Comment: Performed at [facility], [place] [address], [place], [place] [address]  Magnesium     Status: None   Collection Time: 12/03/19 12:28 PM  Result Value Ref Range   Magnesium 1.9 1.7 - 2.4 mg/dL    Comment: Performed at [facility], [place] [address] [place], [place] [address]  T4, free     Status: None   Collection Time: 12/03/19 12:28 PM  Result Value Ref Range   Free T4 1.03 0.61 - 1.12 ng/dL    Comment: (NOTE) Biotin ingestion may interfere with free T4 tests. If the results are inconsistent with the TSH level, previous test results, or the clinical presentation, then consider biotin interference. If needed, order repeat testing after stopping biotin. Performed at [facility], [place] [address] [place], [place] [address]   Troponin I (High Sensitivity)     Status:  Abnormal   Collection Time: 12/03/19 12:28 PM  Result Value Ref Range   Troponin I (High Sensitivity) 71 (H) <18 ng/L    Comment: (NOTE) Elevated high sensitivity troponin I (hsTnI) values and significant  changes across serial measurements may suggest ACS but many other  chronic and acute conditions are known to elevate hsTnI results.  Refer to the Links section for chest pain algorithms and additional  guidance. Performed at [facility], [place] [address], [place], [place] [address]   TSH     Status: None   Collection Time: 12/03/19 12:28 PM  Result Value Ref Range   TSH 2.605 0.350 - 4.500 uIU/mL    Comment: Performed by a 3rd Generation assay with a functional sensitivity of <=0.01 uIU/mL. Performed at [facility], [place] [address] [place], [place] [address]   Uric acid     Status: None   Collection Time: 12/03/19 12:28 PM  Result Value Ref Range   Uric Acid, Serum  7.5 3.7 - 8.6 mg/dL    Comment: Performed at [facility], [place] [address], [place], [place] [address]  Brain natriuretic peptide     Status: Abnormal   Collection Time: 12/03/19 12:28 PM  Result Value Ref Range   B Natriuretic Peptide 117.0 (H) 0.0 - 100.0 pg/mL    Comment: Performed at [place] [address], [place], [place] [address]  C-reactive protein     Status: Abnormal   Collection Time: 12/03/19 12:28 PM  Result Value Ref Range   CRP 1.7 (H) <1.0 mg/dL    Comment: Performed at [facility], [place] [address] [place], [place] [address]  D-dimer, quantitative (not at [facility])     Status: Abnormal   Collection Time: 12/03/19 12:28 PM  Result Value Ref Range   D-Dimer, Quant 1.48 (H) 0.00 - 0.50 ug/mL-FEU    Comment: (NOTE) At the manufacturer cut-off value of 0.5 g/mL FEU, this assay has a negative predictive value of 95-100%.This assay is intended for use in conjunction with a clinical pretest probability (PTP) assessment model to exclude pulmonary embolism (PE) and deep  venous thrombosis (DVT) in outpatients suspected of PE or DVT. Results should be correlated with clinical presentation. Performed at [facility], [place] [address] [place], [place] [address]   Ferritin     Status: None   Collection Time: 12/03/19 12:28 PM  Result Value Ref Range   Ferritin 270 24 - 336 ng/mL    Comment: Performed at [facility], [place] [address] [place], [place] [address]  Fibrinogen     Status: Abnormal   Collection Time: 12/03/19 12:28 PM  Result Value Ref Range   Fibrinogen 478 (H) 210 - 475 mg/dL    Comment: Performed at [place] [address], [place], [place] [address]  Lactate dehydrogenase     Status: Abnormal   Collection Time: 12/03/19 12:28 PM  Result Value Ref Range   LDH 267 (H) 98 - 192 U/L    Comment: SLIGHT HEMOLYSIS Performed at [facility], [place] [address], [place], [place] [address]   Procalcitonin     Status: None   Collection Time: 12/03/19 12:28 PM  Result Value Ref Range   Procalcitonin 0.22 ng/mL    Comment:        Interpretation: PCT (Procalcitonin) <= 0.5 ng/mL: Systemic infection (sepsis) is not likely. Local bacterial infection is possible. (NOTE)       Sepsis PCT Algorithm           Lower Respiratory Tract                                      Infection PCT Algorithm    ----------------------------     ----------------------------         PCT < 0.25 ng/mL                PCT < 0.10 ng/mL          Strongly encourage             Strongly discourage   discontinuation of antibiotics    initiation of antibiotics    ----------------------------     -----------------------------       PCT 0.25 - 0.50 ng/mL            PCT 0.10 - 0.25 ng/mL  OR       >80% decrease in PCT            Discourage initiation of                                            antibiotics      Encourage discontinuation           of antibiotics    ----------------------------     -----------------------------         PCT  >= 0.50 ng/mL              PCT 0.26 - 0.50 ng/mL               AND        <80% decrease in PCT             Encourage initiation of                                             antibiotics       Encourage continuation           of antibiotics    ----------------------------     -----------------------------        PCT >= 0.50 ng/mL                  PCT > 0.50 ng/mL               AND         increase in PCT                  Strongly encourage                                      initiation of antibiotics    Strongly encourage escalation           of antibiotics                                     -----------------------------                                           PCT <= 0.25 ng/mL                                                 OR                                        > 80% decrease in PCT                                      Discontinue / Do not initiate  antibiotics  Performed at [facility], [address] [place], [place] [address]   Hemoglobin A1c     Status: Abnormal   Collection Time: 12/03/19 12:28 PM  Result Value Ref Range   Hgb A1c MFr Bld 6.2 (H) 4.8 - 5.6 %    Comment: (NOTE) Pre diabetes:          5.7%-6.4%  Diabetes:              >6.4%  Glycemic control for   <7.0% adults with diabetes    Mean Plasma Glucose 131.24 mg/dL    Comment: Performed at [facility], [address] [place], [place] [address]  Respiratory Panel by RT PCR (Flu A&B, Covid) - Nasopharyngeal Swab     Status: Abnormal   Collection Time: 12/03/19  2:27 PM   Specimen: Nasopharyngeal Swab  Result Value Ref Range   SARS Coronavirus 2 by RT PCR POSITIVE (A) NEGATIVE    Comment: RESULT CALLED TO, READ BACK BY AND VERIFIED WITH: RN A STRAYTER [id number] (NOTE) SARS-CoV-2 target nucleic acids are DETECTED.  SARS-CoV-2 RNA is generally detectable in upper respiratory specimens  during the acute phase of infection. Positive  results are indicative of the presence of the identified virus, but do not rule out bacterial infection or co-infection with other pathogens not detected by the test. Clinical correlation with patient history and other diagnostic information is necessary to determine patient infection status. The expected result is Negative.  Fact Sheet for Patients:  https://www.moore.com/https://www.fda.gov/media/142436/download  Fact Sheet for Healthcare Providers: https://www.young.biz/https://www.fda.gov/media/142435/download  This test is not yet approved or cleared by the [place] FDA and  has been authorized for detection and/or diagnosis of SARS-CoV-2 by FDA under an Emergency Use Authorization (EUA).  This EUA will remain in effect (meaning this test can be used)  for the duration of  the COVID-19 declaration under Section 564(b)(1) of the Act, 21 U.S.C. section 360bbb-3(b)(1), unless the authorization is terminated or revoked sooner.      Influenza A by PCR NEGATIVE NEGATIVE   Influenza B by PCR NEGATIVE NEGATIVE    Comment: (NOTE) The Xpert Xpress SARS-CoV-2/FLU/RSV assay is intended as an aid in  the diagnosis of influenza from Nasopharyngeal swab specimens and  should not be used as a sole basis for treatment. Nasal washings and  aspirates are unacceptable for Xpert Xpress SARS-CoV-2/FLU/RSV  testing.  Fact Sheet for Patients: https://www.moore.com/https://www.fda.gov/media/142436/download  Fact Sheet for Healthcare Providers: https://www.young.biz/https://www.fda.gov/media/142435/download  This test is not yet approved or cleared by the [place] FDA and  has been authorized for detection and/or diagnosis of SARS-CoV-2 by  FDA under an Emergency Use Authorization (EUA). This EUA will remain  in effect (meaning this test can be used) for the duration of the  Covid-19 declaration under Section 564(b)(1) of the Act, 21  U.S.C. section 360bbb-3(b)(1), unless the authorization is  terminated or revoked. Performed at [facility], [address] [place], [place] [address]   Sodium, urine, random     Status: None   Collection Time: 12/03/19  4:57 PM  Result Value Ref Range   Sodium, Ur 100 mmol/L    Comment: Performed at [facility], [address] [place], [place] [address]  Creatinine, urine, random     Status: None   Collection Time: 12/03/19  4:57 PM  Result Value Ref Range   Creatinine, Urine 130.52 mg/dL    Comment: Performed at [facility],  [address] [place], [place] [address]  Urinalysis, Routine w reflex microscopic Urine, Random     Status: Abnormal   Collection Time: 12/03/19  4:57 PM  Result Value Ref Range   Color, Urine YELLOW YELLOW   APPearance CLEAR CLEAR   Specific Gravity, Urine 1.015 1.005 - 1.030   pH 6.0 5.0 - 8.0   Glucose, UA NEGATIVE NEGATIVE mg/dL   Hgb urine dipstick NEGATIVE NEGATIVE   Bilirubin Urine NEGATIVE NEGATIVE   Ketones, ur NEGATIVE NEGATIVE mg/dL   Protein, ur 100 (A) NEGATIVE mg/dL   Nitrite NEGATIVE NEGATIVE   Leukocytes,Ua NEGATIVE NEGATIVE   RBC / HPF 0-5 0 - 5 RBC/hpf   WBC, UA 0-5 0 - 5 WBC/hpf   Bacteria, UA NONE SEEN NONE SEEN   Mucus PRESENT    Hyaline Casts, UA PRESENT     Comment: Performed at [place] [address] [place], [place] [address]  Glucose, capillary     Status: Abnormal   Collection Time: 12/03/19  9:03 PM  Result Value Ref Range   Glucose-Capillary 103 (H) 70 - 99 mg/dL    Comment: Glucose reference range applies only to samples taken after fasting for at least 8 hours.  Heparin level (unfractionated)     Status: None   Collection Time: 12/04/19  3:29 AM  Result Value Ref Range   Heparin Unfractionated 0.53 0.30 - 0.70 IU/mL    Comment: (NOTE) If heparin results are below expected values, and patient dosage has  been confirmed, suggest follow up testing of antithrombin III levels. Performed at [facility], [place] [address] [place], [place] [address]   Lipid panel     Status: Abnormal   Collection Time:  12/04/19  3:29 AM  Result Value Ref Range   Cholesterol 158 0 - 200 mg/dL   Triglycerides 129 <150 mg/dL   HDL 31 (L) >40 mg/dL   Total CHOL/HDL Ratio 5.1 RATIO   VLDL 26 0 - 40 mg/dL   LDL Cholesterol 101 (H) 0 - 99 mg/dL    Comment:        Total Cholesterol/HDL:CHD Risk Coronary Heart Disease Risk Table                     Men   Women  1/2 Average Risk   3.4   3.3  Average Risk       5.0   4.4  2 X Average Risk   9.6   7.1  3 X Average Risk  23.4   11.0        Use the calculated Patient Ratio above and the CHD Risk Table to determine the patient's CHD Risk.        ATP III CLASSIFICATION (LDL):  <100     mg/dL   Optimal  100-129  mg/dL   Near or Above                    Optimal  130-159  mg/dL   Borderline  160-189  mg/dL   High  >190     mg/dL   Very High Performed at [place] [address], [place], [place] [address]   CBC with Differential/Platelet     Status: Abnormal   Collection Time: 12/04/19  3:29 AM  Result Value Ref Range   WBC 4.9 4.0 - 10.5 K/uL   RBC 4.73 4.22 - 5.81 MIL/uL   Hemoglobin 15.4 13.0 - 17.0  g/dL   HCT 44.4 39.0 - 52.0 %   MCV 93.9 80.0 - 100.0 fL   MCH 32.6 26.0 - 34.0 pg   MCHC 34.7 30.0 - 36.0 g/dL   RDW 13.0 11.5 - 15.5 %   Platelets 143 (L) 150 - 400 K/uL   nRBC 0.0 0.0 - 0.2 %   Neutrophils Relative % 71 %   Neutro Abs 3.5 1.7 - 7.7 K/uL   Lymphocytes Relative 22 %   Lymphs Abs 1.1 0.7 - 4.0 K/uL   Monocytes Relative 7 %   Monocytes Absolute 0.3 0.1 - 1.0 K/uL   Eosinophils Relative 0 %   Eosinophils Absolute 0.0 0.0 - 0.5 K/uL   Basophils Relative 0 %   Basophils Absolute 0.0 0.0 - 0.1 K/uL   Immature Granulocytes 0 %   Abs Immature Granulocytes 0.02 0.00 - 0.07 K/uL    Comment: Performed at [place] [address], [place], [place] [address]  Comprehensive metabolic panel     Status: Abnormal   Collection Time: 12/04/19  3:29 AM  Result Value Ref Range   Sodium 137 135 - 145 mmol/L   Potassium 3.5 3.5  - 5.1 mmol/L   Chloride 104 98 - 111 mmol/L   CO2 22 22 - 32 mmol/L   Glucose, Bld 129 (H) 70 - 99 mg/dL    Comment: Glucose reference range applies only to samples taken after fasting for at least 8 hours.   BUN 31 (H) 8 - 23 mg/dL   Creatinine, Ser 1.47 (H) 0.61 - 1.24 mg/dL   Calcium 8.3 (L) 8.9 - 10.3 mg/dL   Total Protein 6.2 (L) 6.5 - 8.1 g/dL   Albumin 3.1 (L) 3.5 - 5.0 g/dL   AST 22 15 - 41 U/L   ALT 18 0 - 44 U/L   Alkaline Phosphatase 55 38 - 126 U/L   Total Bilirubin 0.7 0.3 - 1.2 mg/dL   GFR, Estimated 50 (L) >60 mL/min    Comment: (NOTE) Calculated using the CKD-EPI Creatinine Equation (2021)    Anion gap 11 5 - 15    Comment: Performed at [facility], [place] [address] [place], [place] [address]  C-reactive protein     Status: Abnormal   Collection Time: 12/04/19  3:29 AM  Result Value Ref Range   CRP 2.7 (H) <1.0 mg/dL    Comment: Performed at [place] [address] [place], [place] [address]  D-dimer, quantitative (not at [facility])     Status: Abnormal   Collection Time: 12/04/19  3:29 AM  Result Value Ref Range   D-Dimer, Quant 1.07 (H) 0.00 - 0.50 ug/mL-FEU    Comment: (NOTE) At the manufacturer cut-off value of 0.5 g/mL FEU, this assay has a negative predictive value of 95-100%.This assay is intended for use in conjunction with a clinical pretest probability (PTP) assessment model to exclude pulmonary embolism (PE) and deep venous thrombosis (DVT) in outpatients suspected of PE or DVT. Results should be correlated with clinical presentation. Performed at [facility], [place] [address], [place], [place] [address]   Ferritin     Status: None   Collection Time: 12/04/19  3:29 AM  Result Value Ref Range   Ferritin 130 24 - 336 ng/mL    Comment: Performed at [place] [address] [place], [place] [address]  Magnesium     Status: None   Collection Time: 12/04/19  3:29 AM  Result  Value Ref Range   Magnesium 1.7 1.7 - 2.4  mg/dL    Comment: Performed at [facility], [place] [address] [place] [address], [place], [place] [address]  Phosphorus     Status: None   Collection Time: 12/04/19  3:29 AM  Result Value Ref Range   Phosphorus 3.1 2.5 - 4.6 mg/dL    Comment: Performed at [place] [address], [place], [place] [address]  Troponin I (High Sensitivity)     Status: Abnormal   Collection Time: 12/04/19  3:29 AM  Result Value Ref Range   Troponin I (High Sensitivity) 54 (H) <18 ng/L    Comment: (NOTE) Elevated high sensitivity troponin I (hsTnI) values and significant  changes across serial measurements may suggest ACS but many other  chronic and acute conditions are known to elevate hsTnI results.  Refer to the "Links" section for chest pain algorithms and additional  guidance. Performed at [facility], [place] [address], [place], [place] [address]   TSH     Status: None   Collection Time: 12/04/19  3:29 AM  Result Value Ref Range   TSH 0.973 0.350 - 4.500 uIU/mL    Comment: Performed by a 3rd Generation assay with a functional sensitivity of <=0.01 uIU/mL. Performed at [facility], [place] [address] [place], [place] [address]   Brain natriuretic peptide     Status: Abnormal   Collection Time: 12/04/19  3:29 AM  Result Value Ref Range   B Natriuretic Peptide 190.3 (H) 0.0 - 100.0 pg/mL    Comment: Performed at [place] [address] [place] [address], [place], [place] [address]  Type and screen [place]     Status: None   Collection Time: 12/04/19  3:36 AM  Result Value Ref Range   ABO/RH(D) A POS    Antibody Screen NEG    Sample Expiration      12/07/2019,2359 Performed at [facility], [place] [address] [place], [place] [address]   Glucose, capillary     Status: Abnormal   Collection Time: 12/04/19  7:30 AM  Result Value Ref Range   Glucose-Capillary 122 (H) 70 - 99 mg/dL    Comment: Glucose reference range applies only to samples taken after fasting for at least  8 hours.  ECHOCARDIOGRAM COMPLETE     Status: None   Collection Time: 12/04/19 10:02 AM  Result Value Ref Range   Weight 2,640 oz   Height 71 in   BP 146/88 mmHg   S' Lateral 2.81 cm   Area-P 1/2 3.05 cm2  Glucose, capillary     Status: Abnormal   Collection Time: 12/04/19 11:34 AM  Result Value Ref Range   Glucose-Capillary 127 (H) 70 - 99 mg/dL    Comment: Glucose reference range applies only to samples taken after fasting for at least 8 hours.  Glucose, capillary     Status: Abnormal   Collection Time: 12/04/19  5:20 PM  Result Value Ref Range   Glucose-Capillary 198 (H) 70 - 99 mg/dL    Comment: Glucose reference range applies only to samples taken after fasting for at least 8 hours.  ABO/Rh     Status: None   Collection Time: 12/04/19  8:34 PM  Result Value Ref Range   ABO/RH(D)      A POS Performed at [facility], [place] [address] [place], [place] [address]   Glucose, capillary     Status: Abnormal  Collection Time: 12/04/19  8:56 PM  Result Value Ref Range   Glucose-Capillary 104 (H) 70 - 99 mg/dL    Comment: Glucose reference range applies only to samples taken after fasting for at least 8 hours.   Comment 1 Document in Chart   CBC with Differential/Platelet     Status: Abnormal   Collection Time: 12/05/19  1:27 AM  Result Value Ref Range   WBC 3.5 (L) 4.0 - 10.5 K/uL   RBC 5.17 4.22 - 5.81 MIL/uL   Hemoglobin 16.3 13.0 - 17.0 g/dL   HCT 48.9 39.0 - 52.0 %   MCV 94.6 80.0 - 100.0 fL   MCH 31.5 26.0 - 34.0 pg   MCHC 33.3 30.0 - 36.0 g/dL   RDW 13.1 11.5 - 15.5 %   Platelets 137 (L) 150 - 400 K/uL   nRBC 0.0 0.0 - 0.2 %   Neutrophils Relative % 59 %   Neutro Abs 2.1 1.7 - 7.7 K/uL   Lymphocytes Relative 31 %   Lymphs Abs 1.1 0.7 - 4.0 K/uL   Monocytes Relative 10 %   Monocytes Absolute 0.3 0.1 - 1.0 K/uL   Eosinophils Relative 0 %   Eosinophils Absolute 0.0 0.0 - 0.5 K/uL   Basophils Relative 0 %   Basophils Absolute 0.0 0.0 - 0.1 K/uL   Immature  Granulocytes 0 %   Abs Immature Granulocytes 0.01 0.00 - 0.07 K/uL    Comment: Performed at [facility], [address], [place], [place] [address]  Comprehensive metabolic panel     Status: Abnormal   Collection Time: 12/05/19  1:27 AM  Result Value Ref Range   Sodium 137 135 - 145 mmol/L   Potassium 5.1 3.5 - 5.1 mmol/L    Comment: DELTA CHECK NOTED SLIGHT HEMOLYSIS    Chloride 102 98 - 111 mmol/L   CO2 24 22 - 32 mmol/L   Glucose, Bld 105 (H) 70 - 99 mg/dL    Comment: Glucose reference range applies only to samples taken after fasting for at least 8 hours.   BUN 31 (H) 8 - 23 mg/dL   Creatinine, Ser 1.61 (H) 0.61 - 1.24 mg/dL   Calcium 8.5 (L) 8.9 - 10.3 mg/dL   Total Protein 6.3 (L) 6.5 - 8.1 g/dL   Albumin 3.0 (L) 3.5 - 5.0 g/dL   AST 27 15 - 41 U/L   ALT 22 0 - 44 U/L   Alkaline Phosphatase 55 38 - 126 U/L   Total Bilirubin 1.0 0.3 - 1.2 mg/dL   GFR, Estimated 45 (L) >60 mL/min    Comment: (NOTE) Calculated using the CKD-EPI Creatinine Equation (2021)    Anion gap 11 5 - 15    Comment: Performed at [facility], [place] [address] [place], [place] [address]  C-reactive protein     Status: Abnormal   Collection Time: 12/05/19  1:27 AM  Result Value Ref Range   CRP 5.0 (H) <1.0 mg/dL    Comment: Performed at [place] [address] [place], [place] [address]  D-dimer, quantitative (not at [facility])     Status: Abnormal   Collection Time: 12/05/19  1:27 AM  Result Value Ref Range   D-Dimer, Quant 1.20 (H) 0.00 - 0.50 ug/mL-FEU    Comment: (NOTE) At the manufacturer cut-off value of 0.5 g/mL FEU, this assay has a negative predictive value of 95-100%.This assay is intended for use in conjunction with a clinical pretest probability (PTP)  assessment model to exclude pulmonary embolism (PE) and deep venous thrombosis (DVT) in outpatients suspected of PE or DVT. Results should be correlated with clinical presentation. Performed at [facility],  [place] [address] [place], [place] [address]   Magnesium     Status: None   Collection Time: 12/05/19  1:27 AM  Result Value Ref Range   Magnesium 2.0 1.7 - 2.4 mg/dL    Comment: Performed at [place] [address] [place], [place] [address]  Brain natriuretic peptide     Status: Abnormal   Collection Time: 12/05/19  1:27 AM  Result Value Ref Range   B Natriuretic Peptide 270.8 (H) 0.0 - 100.0 pg/mL    Comment: Performed at [place] [address], [place], [place] [address]  Glucose, capillary     Status: Abnormal   Collection Time: 12/05/19  7:22 AM  Result Value Ref Range   Glucose-Capillary 109 (H) 70 - 99 mg/dL    Comment: Glucose reference range applies only to samples taken after fasting for at least 8 hours.  Glucose, capillary     Status: Abnormal   Collection Time: 12/05/19 12:05 PM  Result Value Ref Range   Glucose-Capillary 122 (H) 70 - 99 mg/dL    Comment: Glucose reference range applies only to samples taken after fasting for at least 8 hours.  Glucose, capillary     Status: Abnormal   Collection Time: 12/05/19  4:11 PM  Result Value Ref Range   Glucose-Capillary 168 (H) 70 - 99 mg/dL    Comment: Glucose reference range applies only to samples taken after fasting for at least 8 hours.  CBC w/Diff     Status: None   Collection Time: 12/14/19 10:54 AM  Result Value Ref Range   WBC 7.7 4.0 - 10.5 K/uL   RBC 4.41 4.22 - 5.81 Mil/uL   Hemoglobin 14.1 13.0 - 17.0 g/dL   HCT 41.5 39.0 - 52.0 %   MCV 94.2 78.0 - 100.0 fl   MCHC 33.9 30.0 - 36.0 g/dL   RDW 13.1 11.5 - 15.5 %   Platelets 399.0 150.0 - 400.0 K/uL   Neutrophils Relative % 72.5 43.0 - 77.0 %   Lymphocytes Relative 16.6 12.0 - 46.0 %   Monocytes Relative 9.2 3.0 - 12.0 %   Eosinophils Relative 1.1 0.0 - 5.0 %   Basophils Relative 0.6 0.0 - 3.0 %   Neutro Abs 5.6 1.4 - 7.7 K/uL   Lymphs Abs 1.3 0.7 - 4.0 K/uL   Monocytes Absolute 0.7 0.1 - 1.0 K/uL   Eosinophils Absolute 0.1 0.0 - 0.7  K/uL   Basophils Absolute 0.0 0.0 - 0.1 K/uL  Basic metabolic panel     Status: Abnormal   Collection Time: 12/14/19 10:54 AM  Result Value Ref Range   Sodium 140 135 - 145 mEq/L   Potassium 4.3 3.5 - 5.1 mEq/L   Chloride 105 96 - 112 mEq/L   CO2 27 19 - 32 mEq/L   Glucose, Bld 150 (H) 70 - 99 mg/dL   BUN 21 6 - 23 mg/dL   Creatinine, Ser 1.23 0.40 - 1.50 mg/dL   GFR 58.06 (L) >60.00 mL/min    Comment: Calculated using the CKD-EPI Creatinine Equation (2021)   Calcium 8.6 8.4 - 10.5 mg/dL  Basic metabolic panel     Status: Abnormal   Collection Time: 02/02/20 11:09 AM  Result Value Ref Range   Glucose, Bld 105 (H) 65 -  99 mg/dL    Comment: .            Fasting reference interval . For someone without known diabetes, a glucose value between 100 and 125 mg/dL is consistent with prediabetes and should be confirmed with a follow-up test. .    BUN 22 7 - 25 mg/dL   Creat 1.16 0.70 - 1.18 mg/dL    Comment: For patients >16 years of age, the reference limit for Creatinine is approximately 13% higher for people identified as African-American. .    BUN/Creatinine Ratio NOT APPLICABLE 6 - 22 (calc)   Sodium 143 135 - 146 mmol/L   Potassium 5.4 (H) 3.5 - 5.3 mmol/L   Chloride 106 98 - 110 mmol/L   CO2 31 20 - 32 mmol/L   Calcium 9.7 8.6 - 10.3 mg/dL    Assessment/Plan: 1. Essential hypertension BP checked manually and with his electronic cuff.  Electronic cuff reading 15+ degrees higher systolic and 25 degrees higher diastolic compared to manual reading.  Recommend they get a new electronic cuff, an arm cuff and not a wrist cuff, to use to check home ambulatory blood pressures.  BP still elevated above goal manually but much improved from prior home readings and recent readings with cardiology.  BP at 140/92 at end of visit.  Patient asymptomatic.  Examination unremarkable.  We will have him continue 10 mg of amlodipine.  Continue other routine medications.  DASH diet encouraged.   Has follow-up scheduled with cardiology.  He is to check blood pressure daily and record to take to his follow-up.  This visit occurred during the SARS-CoV-2 public health emergency.  Safety protocols were in place, including screening questions prior to the visit, additional usage of staff PPE, and extensive cleaning of exam room while observing appropriate contact time as indicated for disinfecting solutions.     Leeanne Rio, PA-C

## 2020-02-06 ENCOUNTER — Other Ambulatory Visit: Payer: Self-pay | Admitting: Emergency Medicine

## 2020-02-06 ENCOUNTER — Ambulatory Visit: Payer: Medicare HMO | Admitting: Physician Assistant

## 2020-02-06 DIAGNOSIS — E875 Hyperkalemia: Secondary | ICD-10-CM

## 2020-02-08 NOTE — Telephone Encounter (Signed)
Left patient message to call the office back with BP and HR readings. Called to check in on patient.

## 2020-02-09 ENCOUNTER — Telehealth: Payer: Self-pay

## 2020-02-09 NOTE — Telephone Encounter (Signed)
Wonderful, thank you for the update.

## 2020-02-09 NOTE — Telephone Encounter (Signed)
Patient's wife called pt bp was 176/111 with a heart rate of 57. Wife said pcp didn't change no medications but they did tell her the cuff she is using is reading high so she ordered a new one she just wanted to mention that to you. I told her to log pt bp this weekend and call us back Monday morning.

## 2020-02-12 ENCOUNTER — Telehealth: Payer: Self-pay

## 2020-02-12 NOTE — Chronic Care Management (AMB) (Signed)
Chronic Care Management Pharmacy Assistant   Name: John Woodward.  MRN: [id number] DOB: [birth year]  Reason for Encounter: Medication Review   PCP : Brunetta Jeans, PA-C  Allergies:   Allergies  Allergen Reactions  . Antibacterial Hand Soap [Triclosan] Other (See Comments)    Caused water blisters    Medications: Outpatient Encounter Medications as of 02/12/2020  Medication Sig  . amLODipine (NORVASC) 10 MG tablet Take 10 mg by mouth daily.  Marland Kitchen apixaban (ELIQUIS) 5 MG TABS tablet Take 1 tablet (5 mg total) by mouth 2 (two) times daily.  Marland Kitchen escitalopram (LEXAPRO) 10 MG tablet Take 1 tablet (10 mg total) by mouth daily.  Marland Kitchen losartan (COZAAR) 100 MG tablet Take 1 tablet (100 mg total) by mouth daily.  . metFORMIN (GLUCOPHAGE) 500 MG tablet TAKE ONE TABLET BY MOUTH EVERY MORNING and TAKE TWO TABLETS BY MOUTH EVERY EVENING  . metoprolol tartrate (LOPRESSOR) 25 MG tablet Take 1 tablet (25 mg total) by mouth 2 (two) times daily.  . nicotine polacrilex (COMMIT) 4 MG lozenge Take 4 mg by mouth as needed for smoking cessation.  . rosuvastatin (CRESTOR) 10 MG tablet Take 1 tablet (10 mg total) by mouth daily.   No facility-administered encounter medications on file as of 02/12/2020.    Current Diagnosis: Patient Active Problem List   Diagnosis Date Noted  . A-fib ([place]) 12/03/2019  . Pulmonary emphysema ([place]) 09/22/2018  . Encounter for screening for malignant neoplasm of respiratory organs 02/07/2018  . Screening for AAA (abdominal aortic aneurysm) 02/07/2018  . Skin cancer screening 02/07/2018  . Need for 23-polyvalent pneumococcal polysaccharide vaccine 02/07/2018  . Encounter for immunization 02/07/2018  . Visit for preventive health examination 02/07/2018  . Hx of colonic polyps 02/23/2017  . Hyperlipidemia associated with type 2 diabetes mellitus ([place]) 10/02/2016  . Diabetes mellitus type II, uncontrolled ([place]) 09/18/2016  . Encounter for Medicare annual wellness exam  09/16/2016  . Special screening examination for viral disease 09/16/2016  . Annual physical exam 09/16/2016  . Prostate cancer screening 09/16/2016  . Tobacco abuse disorder 08/11/2016  . Essential hypertension 08/11/2016    Reviewed chart for medication changes ahead of medication coordination call.  02/05/2020 OV with Raiford Noble C, PA-C, Amlodipine 5 mg tablets increased to Amlodipine 10 mg tablets.  BP Readings from Last 3 Encounters:  02/05/20 (!) 140/92  02/01/20 (!) 182/90  12/25/19 (!) 184/74    Lab Results  Component Value Date   HGBA1C 6.2 (H) 12/03/2019     Patient obtains medications through  Vials  90 Days   Last adherence delivery included: (medication name and frequency) Eliquis 5 mg Tab Take 1 tab by mouth two times daily Metoprolol 50 mg Tab Take 1 tab by mouth 2 times daily Rosuvastatin 10 mg Tablet Take one tablet mon, wed and friday  Patient is due for next adherence delivery on: 02/15/2020. Called patient and reviewed medications and coordinated delivery.  This delivery to include:  Amlodipine 10 mg tablet - one tablet daily - 5 mg tablets on file Escitalopram 10 mg tablet- one tablet daily - needs refills  Patient declined the following medications:  Metformin 500 mg tablets - has 1/3 of #270 tablets on hand Metoprolol 25 mg tablets - patient takes 1/2 tablet twice daily has #150 tablets on hand. HCTZ - not currently taking per patient's wife Potassium - not currently taking per patient's wife Eliquis 5 mg tablets- patient has #75 tablets on hand Rosuvastatin 10 mg  tablets- patient only takes 3 times a week and has #27 tablets on hand. Losartan 100 mg tablets- patient has  #50 tablets on hand.   Patient needs refills for  Amlodipine 10 mg tablet - one tablet daily - 5 mg tablets on file Escitalopram 10 mg tablet- one tablet daily - needs refills.  Confirmed delivery date of 02/15/2020, advised patient that pharmacy will contact them the  morning of delivery.  April D Calhoun, [place] Pharmacist Assistant [phone]  Follow-Up:  Pharmacist Review

## 2020-02-13 ENCOUNTER — Telehealth: Payer: Self-pay

## 2020-02-13 ENCOUNTER — Other Ambulatory Visit: Payer: Self-pay | Admitting: Emergency Medicine

## 2020-02-13 DIAGNOSIS — F321 Major depressive disorder, single episode, moderate: Secondary | ICD-10-CM

## 2020-02-13 MED ORDER — ESCITALOPRAM OXALATE 10 MG PO TABS: 10.0000 mg | ORAL_TABLET | Freq: Every day | ORAL | 2 refills | Status: DC

## 2020-02-13 NOTE — Progress Notes (Signed)
Primary Physician/Referring:  Brunetta Jeans, PA-C  Patient ID: John Heman., male    DOB: [birth year], 75 y.o.   MRN: [id number]  Chief Complaint  Patient presents with   Hypertension   Bradycardia   Results   Follow-up    2 week   HPI:    John Pineo.  is a 75 y.o. with history of COPD, hypertension, hyperlipidemia, type II diabetes mellitus non-insulin dependent. He presented to emergency department 12/03/2019 following a syncopal episode at home. Upon admission patient went into atrial fibrillation with RVR and was found to be positive for Covid 19. Patient was started on Eliquis for anticoagulation.    Patient presents for 2 week follow up of hypertension and bradycardia. At last visit reduced metoprolol tartrate from 50 mg to 25 mg twice daily and added amlodipine 10 mg daily. Also at last visit increased rosuvastatin to 10 mg daily, from 3 days per week however patient has not done so.  Patient remains asymptomatic, denies chest pain, palpitations, dyspnea, orthopnea, PND, leg swelling, dizziness, claudication.  Unfortunately he does continue to smoke.  He is tolerating Eliquis without bleeding diathesis.  Patient has been monitoring blood pressure at home and brings with him a written log with readings averaging 170-180/80-90 mmHg and heart rate 50s bpm.  Past Medical History:  Diagnosis Date   Arthritis    Closed fracture dislocation of right elbow    30 years ago -- fell out of theback of a truck   Diabetes mellitus without complication ([place])    Hx of colonic polyps 02/23/2017   Hyperlipidemia    Hypertension    Diagnosed 10 years ago. No on medication. Denies history of CAD.    Past Surgical History:  Procedure Laterality Date   OLECRANON BURSECTOMY Right 08/18/2016   Procedure: OLECRANON BURSECTOMY;  Surgeon: Meredith Pel, MD;  Location: [facility];  Service: Orthopedics;  Laterality: Right;   Family History  Problem Relation Age of Onset    Hypertension Mother    Dementia Mother        67s   Transient ischemic attack Mother        84s   Kidney disease Father    COPD Father    Hypertension Brother    Alcohol abuse Maternal Grandfather    Mental illness Paternal Grandmother    Hypertension Daughter    Healthy Son    Learning disabilities Son    Lung cancer Maternal Aunt    Colon cancer Neg Hx    Colon polyps Neg Hx    Esophageal cancer Neg Hx    Rectal cancer Neg Hx    Stomach cancer Neg Hx     Social History   Tobacco Use   Smoking status: Current Every Day Smoker    Packs/day: 0.25    Years: 50.00    Pack years: 12.50    Types: Cigarettes    Start date: 08/12/1966   Smokeless tobacco: Never Used   Tobacco comment: Taking Nicotine lozenges  Substance Use Topics   Alcohol use: No   Marital Status: Married   ROS  Review of Systems  Constitutional: Negative for malaise/fatigue and weight gain.  Cardiovascular: Negative for chest pain, claudication, leg swelling, near-syncope, orthopnea, palpitations, paroxysmal nocturnal dyspnea and syncope.  Respiratory: Negative for shortness of breath.   Hematologic/Lymphatic: Does not bruise/bleed easily.  Gastrointestinal: Negative for melena.  Neurological: Negative for dizziness and weakness.    Objective  Blood pressure (!) 182/78,  pulse (!) 50, temperature 98 F (36.7 C), resp. rate 17, height 5\' 11"  (1.803 m), weight 150 lb (68 kg), SpO2 97 %.  Vitals with BMI 02/16/2020 02/05/2020 02/01/2020  Height 5\' 11"  5\' 11"  -  Weight 150 lbs 151 lbs -  BMI 123456 [id number] -  Systolic Q000111Q [id number] Q000111Q  Diastolic 78 92 90  Pulse 50 54 50      Physical Exam Vitals reviewed.  HENT:     Head: Normocephalic and atraumatic.  Cardiovascular:     Rate and Rhythm: Regular rhythm. Bradycardia present.     Pulses: No decreased pulses.          Carotid pulses are 2+ on the right side and 2+ on the left side.      Radial pulses are 2+ on the right side and 2+ on the  left side.       Femoral pulses are 1+ on the right side and 1+ on the left side.      Popliteal pulses are 1+ on the right side and 1+ on the left side.       Dorsalis pedis pulses are 0 on the right side and 0 on the left side.       Posterior tibial pulses are 0 on the right side and 0 on the left side.     Heart sounds: S1 normal and S2 normal. No murmur heard. No gallop.      Comments: Venous stasis skin changes bilateral lower legs and feet, left more than right. No JVD, no leg edema.   Left foot cool to touch compared to right foot.  Pulmonary:     Effort: Pulmonary effort is normal. No respiratory distress.     Breath sounds: No wheezing, rhonchi or rales.  Musculoskeletal:     Right lower leg: No edema.     Left lower leg: No edema.  Skin:    General: Skin is warm and dry.     Capillary Refill: Capillary refill takes less than 2 seconds.     Findings: No erythema, lesion or rash.  Neurological:     General: No focal deficit present.     Mental Status: He is alert and oriented to person, place, and time.     Motor: No weakness.    Laboratory examination:   Recent Labs    12/03/19 1228 12/04/19 0329 12/05/19 0127 12/14/19 1054 02/02/20 1109  NA 138 137 137 140 143  K 4.4 3.5 5.1 4.3 5.4*  CL 104 104 102 105 106  CO2 24 22 24 27 31   GLUCOSE 149* 129* 105* 150* 105*  BUN 32* 31* 31* 21 22  CREATININE 1.83* 1.47* 1.61* 1.23 1.16  CALCIUM 8.3* 8.3* 8.5* 8.6 9.7  GFRNONAA 38* 50* 45*  --   --    estimated creatinine clearance is 53.7 mL/min (by C-G formula based on SCr of 1.16 mg/dL).  CMP Latest Ref Rng & Units 02/02/2020 12/14/2019 12/05/2019  Glucose 65 - 99 mg/dL 105(H) 150(H) 105(H)  BUN 7 - 25 mg/dL 22 21 31(H)  Creatinine 0.70 - 1.18 mg/dL 1.16 1.23 1.61(H)  Sodium 135 - 146 mmol/L 143 140 137  Potassium 3.5 - 5.3 mmol/L 5.4(H) 4.3 5.1  Chloride 98 - 110 mmol/L 106 105 102  CO2 20 - 32 mmol/L 31 27 24   Calcium 8.6 - 10.3 mg/dL 9.7 8.6 8.5(L)  Total Protein  6.5 - 8.1 g/dL - - 6.3(L)  Total Bilirubin 0.3 - 1.2 mg/dL - -  1.0  Alkaline Phos 38 - 126 U/L - - 55  AST 15 - 41 U/L - - 27  ALT 0 - 44 U/L - - 22   CBC Latest Ref Rng & Units 12/14/2019 12/05/2019 12/04/2019  WBC 4.0 - 10.5 K/uL 7.7 3.5(L) 4.9  Hemoglobin 13.0 - 17.0 g/dL 14.1 16.3 15.4  Hematocrit 39.0 - 52.0 % 41.5 48.9 44.4  Platelets 150.0 - 400.0 K/uL 399.0 137(L) 143(L)    Lipid Panel Recent Labs    08/24/19 0840 12/04/19 0329  CHOL 216* 158  TRIG 212.0* 129  LDLCALC  --  101*  VLDL 42.4* 26  HDL 37.70* 31*  CHOLHDL 6 5.1  LDLDIRECT 139.0  --     HEMOGLOBIN A1C Lab Results  Component Value Date   HGBA1C 6.2 (H) 12/03/2019   MPG 131.24 12/03/2019   TSH Recent Labs    12/03/19 1228 12/04/19 0329  TSH 2.605 0.973    External labs:  None   Medications and allergies   Allergies  Allergen Reactions   Antibacterial Hand Soap [Triclosan] Other (See Comments)    Caused water blisters     Outpatient Medications Prior to Visit  Medication Sig Dispense Refill   amLODipine (NORVASC) 10 MG tablet Take 1 tablet (10 mg total) by mouth daily. 90 tablet 1   apixaban (ELIQUIS) 5 MG TABS tablet Take 1 tablet (5 mg total) by mouth 2 (two) times daily. 180 tablet 1   escitalopram (LEXAPRO) 10 MG tablet Take 1 tablet (10 mg total) by mouth daily. 90 tablet 2   losartan (COZAAR) 100 MG tablet Take 1 tablet (100 mg total) by mouth daily. 90 tablet 3   metFORMIN (GLUCOPHAGE) 500 MG tablet TAKE ONE TABLET BY MOUTH EVERY MORNING and TAKE TWO TABLETS BY MOUTH EVERY EVENING 90 tablet 0   nicotine polacrilex (COMMIT) 4 MG lozenge Take 4 mg by mouth as needed for smoking cessation.     rosuvastatin (CRESTOR) 10 MG tablet Take 1 tablet (10 mg total) by mouth daily. 30 tablet 3   metoprolol tartrate (LOPRESSOR) 25 MG tablet Take 1 tablet (25 mg total) by mouth 2 (two) times daily. 60 tablet 3   No facility-administered medications prior to visit.     Radiology:   No  results found.  Cardiac Studies:   ABI 12/29/2019:  This exam reveals moderately decreased perfusion of the right lower extremity, noted at the post tibial artery level (ABI 0.51) and severely decreased perfusion of the left lower extremity, noted at the dorsalis pedis and post tibial artery level (ABI 0.32).  Right AT appears to be occluded with dampened waveform. There is severely abnormal monophasic waveform of all vessels at the level of the ankle.  PCV MYOCARDIAL PERFUSION WO LEXISCAN 12/27/2019 Nondiagnostic ECG stress due to pharmacologic stress. Resting EKG demonstrated normal sinus rhythm. Non-specific T-wave abnormalities. Peak EKG revealed no significant ST-T changes. Myocardial perfusion is normal. Overall LV systolic function is normal without regional wall motion abnormalities. Stress LV EF: 58%. No previous exam available for comparison. Low risk.  Echocardiogram 12/04/2019:  1. Patient in afib during exam.  2. Left ventricular ejection fraction, by estimation, is 60 to 65%. The left ventricle has normal function. The left ventricle has no regional wall motion abnormalities. Left ventricular diastolic parameters are indeterminate.  3. Right ventricular systolic function is normal. The right ventricular size is normal.  4. Left atrial size was mildly dilated.  5. The mitral valve is degenerative. Trivial mitral valve regurgitation.  No evidence of mitral stenosis.  6. The aortic valve was not well visualized. Aortic valve regurgitation is not visualized. Mild to moderate aortic valve sclerosis/calcification is present, without any evidence of aortic stenosis.  7. The inferior vena cava is normal in size with greater than 50% respiratory variability, suggesting right atrial pressure of 3 mmHg.  EKG:   EKG 02/16/2020: Marked sinus bradycardia at a rate of 49 bpm, left atrial enlargement.  Normal axis.  Poor R wave progression, cannot exclude anteroseptal infarct old.   Nonspecific T wave abnormality.  02/01/2020: Marked sinus bradycardia rate of 47 bpm, left atrial enlargement.  Normal axis.  Poor R wave progression, cannot exclude anteroseptal infarct old.  12/25/2019: Sinus bradycardia at rate of 48 bpm. Normal axis. Left atrial enlargement. PRWP, cannot exclude anterolateral infarct old. Non-specific T wave abnormality.   12/04/2019: Atrial fibrillation with rapid ventricular response at a rate of 107 bpm. Normal axis, left atrial. Enlargement.   Assessment     ICD-10-CM   1. Essential hypertension  I10   2. Bradycardia  R00.1 EKG 12-Lead     Medications Discontinued During This Encounter  Medication Reason   metoprolol tartrate (LOPRESSOR) 25 MG tablet Side effect (s)    No orders of the defined types were placed in this encounter.  This patients CHA2DS2-VASc Score 4 (HTN, DM, Age, PAD) and yearly risk of stroke 4.8%.  Recommendations:   John Bastos. is a 75 y.o. with history of hypertension, hyperlipidemia, type II diabetes mellitus. Also with history of covid 19 infection 11/2019, and diagnosed with atrial fibrillation 12/03/2019.   Patient presents for 2-week follow-up of hypertension and bradycardia.  Despite reducing metoprolol to 25 mg twice daily, patient remains markedly bradycardic with significantly elevated blood pressure.  We will stop metoprolol titrate.  Patient will continue to monitor blood pressure and heart rate on a daily basis at home and keep a written log.  Patient will call the office with blood pressure and heart rate readings in 1 week, at which time we will reevaluate need for additional antihypertensive medications.  Also advised patient to increase rosuvastatin from 3 days/week to daily.  Patient will monitor for side effects of memory impairment and notify our office if he experiences it.  In regards to atrial fibrillation, patient is presently maintaining sinus rhythm and tolerating anticoagulation without bleeding  diathesis.  We will continue Eliquis. In regard to PAD, patient is without rest pain or ulceration, therefore advised him to notify the office if he experiences worsening symptoms. Patient has good capillary refill despite reduced distal pulses.   Follow up in 6 weeks, sooner if needed, for hypertension and bradycardia.    John Berthold, PA-C 02/16/2020, 10:29 AM Office: [phone]

## 2020-02-13 NOTE — Chronic Care Management (AMB) (Signed)
    Chronic Care Management Pharmacy Assistant   Name: Lewin Pellow.  MRN: [id number] DOB: 08-May-1945  Reason for Encounter: Patient Call/Request Refill  PCP : Brunetta Jeans, PA-C  Allergies:   Allergies  Allergen Reactions  . Antibacterial Hand Soap [Triclosan] Other (See Comments)    Caused water blisters    Medications: Outpatient Encounter Medications as of 02/13/2020  Medication Sig  . amLODipine (NORVASC) 10 MG tablet Take 10 mg by mouth daily.  Marland Kitchen apixaban (ELIQUIS) 5 MG TABS tablet Take 1 tablet (5 mg total) by mouth 2 (two) times daily.  Marland Kitchen escitalopram (LEXAPRO) 10 MG tablet Take 1 tablet (10 mg total) by mouth daily.  Marland Kitchen losartan (COZAAR) 100 MG tablet Take 1 tablet (100 mg total) by mouth daily.  . metFORMIN (GLUCOPHAGE) 500 MG tablet TAKE ONE TABLET BY MOUTH EVERY MORNING and TAKE TWO TABLETS BY MOUTH EVERY EVENING  . metoprolol tartrate (LOPRESSOR) 25 MG tablet Take 1 tablet (25 mg total) by mouth 2 (two) times daily.  . nicotine polacrilex (COMMIT) 4 MG lozenge Take 4 mg by mouth as needed for smoking cessation.  . rosuvastatin (CRESTOR) 10 MG tablet Take 1 tablet (10 mg total) by mouth daily.   No facility-administered encounter medications on file as of 02/13/2020.    Current Diagnosis: Patient Active Problem List   Diagnosis Date Noted  . A-fib ([place]) 12/03/2019  . Pulmonary emphysema ([place]) 09/22/2018  . Encounter for screening for malignant neoplasm of respiratory organs 02/07/2018  . Screening for AAA (abdominal aortic aneurysm) 02/07/2018  . Skin cancer screening 02/07/2018  . Need for 23-polyvalent pneumococcal polysaccharide vaccine 02/07/2018  . Encounter for immunization 02/07/2018  . Visit for preventive health examination 02/07/2018  . Hx of colonic polyps 02/23/2017  . Hyperlipidemia associated with type 2 diabetes mellitus ([place]) 10/02/2016  . Diabetes mellitus type II, uncontrolled ([place]) 09/18/2016  . Encounter for Medicare annual wellness  exam 09/16/2016  . Special screening examination for viral disease 09/16/2016  . Annual physical exam 09/16/2016  . Prostate cancer screening 09/16/2016  . Tobacco abuse disorder 08/11/2016  . Essential hypertension 08/11/2016   I reached out to patient's cardiologist [facility], [place], [place], [place]) to request refills for Amlodipine 10 mg tablets to be sent to [facility]. Patient was recently changed from Amlodipine 5 mg tablets to Amlodipine 10 mg tablets. When I called the office they were closed due to inclement weather. The after hours person answered the phone and took the message for the requested refill. They are to return my call if they have any questions.  April D Calhoun, [place] Pharmacist Assistant [phone]   Follow-Up:  Pharmacist Review

## 2020-02-14 ENCOUNTER — Other Ambulatory Visit: Payer: Self-pay

## 2020-02-14 MED ORDER — AMLODIPINE BESYLATE 10 MG PO TABS: 10.0000 mg | ORAL_TABLET | Freq: Every day | ORAL | 1 refills | Status: DC

## 2020-02-15 ENCOUNTER — Ambulatory Visit: Payer: Medicare HMO | Admitting: Student

## 2020-02-15 DIAGNOSIS — Z7984 Long term (current) use of oral hypoglycemic drugs: Secondary | ICD-10-CM | POA: Diagnosis not present

## 2020-02-15 DIAGNOSIS — E119 Type 2 diabetes mellitus without complications: Secondary | ICD-10-CM | POA: Diagnosis not present

## 2020-02-15 DIAGNOSIS — H2513 Age-related nuclear cataract, bilateral: Secondary | ICD-10-CM | POA: Diagnosis not present

## 2020-02-15 LAB — HM DIABETES EYE EXAM

## 2020-02-16 ENCOUNTER — Encounter: Payer: Self-pay | Admitting: Student

## 2020-02-16 ENCOUNTER — Ambulatory Visit: Payer: Medicare HMO | Admitting: Student

## 2020-02-16 ENCOUNTER — Other Ambulatory Visit: Payer: Self-pay

## 2020-02-16 VITALS — BP 182/78 | HR 50 | Temp 98.0°F | Resp 17 | Ht 71.0 in | Wt 150.0 lb

## 2020-02-16 DIAGNOSIS — I10 Essential (primary) hypertension: Secondary | ICD-10-CM | POA: Diagnosis not present

## 2020-02-16 DIAGNOSIS — R001 Bradycardia, unspecified: Secondary | ICD-10-CM | POA: Diagnosis not present

## 2020-02-16 NOTE — Patient Instructions (Signed)
Call office in 1 week with blood pressure and heart rate.

## 2020-02-20 ENCOUNTER — Encounter: Payer: Self-pay | Admitting: Emergency Medicine

## 2020-02-23 ENCOUNTER — Other Ambulatory Visit: Payer: Medicare HMO

## 2020-02-23 ENCOUNTER — Telehealth: Payer: Self-pay

## 2020-02-23 MED ORDER — HYDROCHLOROTHIAZIDE 12.5 MG PO CAPS: 12.5000 mg | ORAL_CAPSULE | Freq: Every day | ORAL | 3 refills | Status: DC

## 2020-02-23 NOTE — Telephone Encounter (Signed)
bp readings for 1 week 02/17/20 161/90 HR 58 02/18/20 162/87 HR 76 02/19/20 171/94 HR 84 02/20/20 193/101 HR 81 02/21/20 170/94 HR 85 02/22/20 165/91 HR 84 02/23/20 160/93 HR 83 alll BP readings are early in the morning before meds

## 2020-02-23 NOTE — Telephone Encounter (Signed)
Blood pressure has improved bu remains elevated. Heart rate per home readings has improved as well. Advised patient to restart hydrochlorothiazide 12.5 mg daily. Will repeat BMP in 1 week.

## 2020-02-29 ENCOUNTER — Other Ambulatory Visit: Payer: Self-pay

## 2020-02-29 ENCOUNTER — Other Ambulatory Visit (INDEPENDENT_AMBULATORY_CARE_PROVIDER_SITE_OTHER): Payer: Medicare HMO

## 2020-02-29 DIAGNOSIS — E875 Hyperkalemia: Secondary | ICD-10-CM

## 2020-02-29 LAB — BASIC METABOLIC PANEL
BUN: 28 mg/dL — ABNORMAL HIGH (ref 6–23)
CO2: 33 mEq/L — ABNORMAL HIGH (ref 19–32)
Calcium: 9.7 mg/dL (ref 8.4–10.5)
Chloride: 101 mEq/L (ref 96–112)
Creatinine, Ser: 1.19 mg/dL (ref 0.40–1.50)
GFR: 60.32 mL/min (ref >60.00)
Glucose, Bld: 137 mg/dL — ABNORMAL HIGH (ref 70–99)
Potassium: 4 mEq/L (ref 3.5–5.1)
Sodium: 138 mEq/L (ref 135–145)

## 2020-03-07 ENCOUNTER — Telehealth: Payer: Self-pay

## 2020-03-07 NOTE — Progress Notes (Signed)
    Chronic Care Management Pharmacy Assistant   Name: Trentin Knappenberger.  MRN: [id number] DOB: 1945-07-27  Reason for Encounter: Medication Review  PCP : Brunetta Jeans, PA-C  Allergies:   Allergies  Allergen Reactions  . Antibacterial Hand Soap [Triclosan] Other (See Comments)    Caused water blisters    Medications: Outpatient Encounter Medications as of 03/07/2020  Medication Sig  . amLODipine (NORVASC) 10 MG tablet Take 1 tablet (10 mg total) by mouth daily.  Marland Kitchen apixaban (ELIQUIS) 5 MG TABS tablet Take 1 tablet (5 mg total) by mouth 2 (two) times daily.  Marland Kitchen escitalopram (LEXAPRO) 10 MG tablet Take 1 tablet (10 mg total) by mouth daily.  . hydrochlorothiazide (MICROZIDE) 12.5 MG capsule Take 1 capsule (12.5 mg total) by mouth daily.  Marland Kitchen losartan (COZAAR) 100 MG tablet Take 1 tablet (100 mg total) by mouth daily.  . metFORMIN (GLUCOPHAGE) 500 MG tablet TAKE ONE TABLET BY MOUTH EVERY MORNING and TAKE TWO TABLETS BY MOUTH EVERY EVENING  . nicotine polacrilex (COMMIT) 4 MG lozenge Take 4 mg by mouth as needed for smoking cessation.  . rosuvastatin (CRESTOR) 10 MG tablet Take 1 tablet (10 mg total) by mouth daily.   No facility-administered encounter medications on file as of 03/07/2020.    Current Diagnosis: Patient Active Problem List   Diagnosis Date Noted  . A-fib ([place]) 12/03/2019  . Pulmonary emphysema ([place]) 09/22/2018  . Encounter for screening for malignant neoplasm of respiratory organs 02/07/2018  . Screening for AAA (abdominal aortic aneurysm) 02/07/2018  . Skin cancer screening 02/07/2018  . Need for 23-polyvalent pneumococcal polysaccharide vaccine 02/07/2018  . Encounter for immunization 02/07/2018  . Visit for preventive health examination 02/07/2018  . Hx of colonic polyps 02/23/2017  . Hyperlipidemia associated with type 2 diabetes mellitus ([place]) 10/02/2016  . Diabetes mellitus type II, uncontrolled ([place]) 09/18/2016  . Encounter for Medicare annual wellness  exam 09/16/2016  . Special screening examination for viral disease 09/16/2016  . Annual physical exam 09/16/2016  . Prostate cancer screening 09/16/2016  . Tobacco abuse disorder 08/11/2016  . Essential hypertension 08/11/2016     Reviewed chart for medication changes ahead of medication coordination call.  No OVs, Consults, or hospital visits since last care coordination call/Pharmacist visit. (If appropriate, list visit date, provider name)  No medication changes indicated OR if recent visit, treatment plan here.  BP Readings from Last 3 Encounters:  02/16/20 (!) 182/78  02/05/20 (!) 140/92  02/01/20 (!) 182/90    Lab Results  Component Value Date   HGBA1C 6.2 (H) 12/03/2019      Patient obtains medications through Vials  90 Days   Last adherence delivery included:    Metformin Hcl 500 mg Take one tablet by mouth every morning and two tablets by mouth every evening.  Patient is due for next adherence delivery on: 03-15-20. Called patient and reviewed medications and coordinated delivery.  This delivery to include: Metformin Hcl 500 mg Take one tablet by mouth every morning and two tablets by mouth every evening.   Patient needs refills for : Metformin Hcl 500 mg Take one tablet by mouth every morning and two tablets by mouth every evening- CPP to request  Confirmed delivery date of 03-15-20, advised patient that pharmacy will contact them the morning of delivery.  Georgiana Shore ,[place] Pharmacist Assistant [phone]   Follow-Up:  Pharmacist Review

## 2020-03-18 ENCOUNTER — Other Ambulatory Visit: Payer: Self-pay

## 2020-03-18 DIAGNOSIS — E1165 Type 2 diabetes mellitus with hyperglycemia: Secondary | ICD-10-CM

## 2020-03-18 MED ORDER — METFORMIN HCL 500 MG PO TABS: ORAL_TABLET | ORAL | 0 refills | Status: DC

## 2020-03-18 NOTE — Telephone Encounter (Signed)
Patient Rx sent to pharmacy

## 2020-03-25 ENCOUNTER — Telehealth: Payer: Self-pay

## 2020-03-25 NOTE — Chronic Care Management (AMB) (Signed)
    Chronic Care Management Pharmacy Assistant   Name: John Woodward.  MRN: [id number] DOB: Apr 16, 1945  Reason for Encounter: Chart Review  PCP : Brunetta Jeans, PA-C  Allergies:   Allergies  Allergen Reactions  . Antibacterial Hand Soap [Triclosan] Other (See Comments)    Caused water blisters    Medications: Outpatient Encounter Medications as of 03/25/2020  Medication Sig  . amLODipine (NORVASC) 10 MG tablet Take 1 tablet (10 mg total) by mouth daily.  Marland Kitchen apixaban (ELIQUIS) 5 MG TABS tablet Take 1 tablet (5 mg total) by mouth 2 (two) times daily.  Marland Kitchen escitalopram (LEXAPRO) 10 MG tablet Take 1 tablet (10 mg total) by mouth daily.  . hydrochlorothiazide (MICROZIDE) 12.5 MG capsule Take 1 capsule (12.5 mg total) by mouth daily.  Marland Kitchen losartan (COZAAR) 100 MG tablet Take 1 tablet (100 mg total) by mouth daily.  . metFORMIN (GLUCOPHAGE) 500 MG tablet TAKE ONE TABLET BY MOUTH EVERY MORNING and TAKE TWO TABLETS BY MOUTH EVERY EVENING  . nicotine polacrilex (COMMIT) 4 MG lozenge Take 4 mg by mouth as needed for smoking cessation.  . rosuvastatin (CRESTOR) 10 MG tablet Take 1 tablet (10 mg total) by mouth daily.   No facility-administered encounter medications on file as of 03/25/2020.    Current Diagnosis: Patient Active Problem List   Diagnosis Date Noted  . A-fib ([place]) 12/03/2019  . Pulmonary emphysema ([place]) 09/22/2018  . Encounter for screening for malignant neoplasm of respiratory organs 02/07/2018  . Screening for AAA (abdominal aortic aneurysm) 02/07/2018  . Skin cancer screening 02/07/2018  . Need for 23-polyvalent pneumococcal polysaccharide vaccine 02/07/2018  . Encounter for immunization 02/07/2018  . Visit for preventive health examination 02/07/2018  . Hx of colonic polyps 02/23/2017  . Hyperlipidemia associated with type 2 diabetes mellitus ([place]) 10/02/2016  . Diabetes mellitus type II, uncontrolled ([place]) 09/18/2016  . Encounter for Medicare annual wellness exam  09/16/2016  . Special screening examination for viral disease 09/16/2016  . Annual physical exam 09/16/2016  . Prostate cancer screening 09/16/2016  . Tobacco abuse disorder 08/11/2016  . Essential hypertension 08/11/2016    Reviewed chart for medication changes.  No OVs, Consults, or hospital visits since last care coordination call/Pharmacist visit. No medication changes indicated.  No future appointments.  I called and left a message for the patient to return my call to schedule follow-up appointment with clinical pharmacist Madelin Rear, CPP.  April D Calhoun, [place] Pharmacist Assistant [phone]   Follow-Up:  Pharmacist Review

## 2020-04-10 ENCOUNTER — Telehealth: Payer: Self-pay

## 2020-04-10 NOTE — Progress Notes (Signed)
° ° °  Chronic Care Management Pharmacy Assistant   Name: John Woodward.  MRN: [id number] DOB: Oct 27, 1945    Reason for Encounter: Medication Review    Recent office visits:  None noted  Recent consult visits:  None noted  Hospital visits:  None in previous 6 months  Medications: Outpatient Encounter Medications as of 04/10/2020  Medication Sig   amLODipine (NORVASC) 10 MG tablet Take 1 tablet (10 mg total) by mouth daily.   apixaban (ELIQUIS) 5 MG TABS tablet Take 1 tablet (5 mg total) by mouth 2 (two) times daily.   escitalopram (LEXAPRO) 10 MG tablet Take 1 tablet (10 mg total) by mouth daily.   hydrochlorothiazide (MICROZIDE) 12.5 MG capsule Take 1 capsule (12.5 mg total) by mouth daily.   losartan (COZAAR) 100 MG tablet Take 1 tablet (100 mg total) by mouth daily.   metFORMIN (GLUCOPHAGE) 500 MG tablet TAKE ONE TABLET BY MOUTH EVERY MORNING and TAKE TWO TABLETS BY MOUTH EVERY EVENING   nicotine polacrilex (COMMIT) 4 MG lozenge Take 4 mg by mouth as needed for smoking cessation.   rosuvastatin (CRESTOR) 10 MG tablet Take 1 tablet (10 mg total) by mouth daily.   No facility-administered encounter medications on file as of 04/10/2020.    Reviewed chart for medication changes ahead of medication coordination call.  No OVs, Consults, or hospital visits since last care coordination call/Pharmacist visit. (If appropriate, list visit date, provider name)  No medication changes indicated OR if recent visit, treatment plan here.  BP Readings from Last 3 Encounters:  02/16/20 (!) 182/78  02/05/20 (!) 140/92  02/01/20 (!) 182/90    Lab Results  Component Value Date   HGBA1C 6.2 (H) 12/03/2019     Patient obtains medications through Vials  90 Days   Last adherence delivery included:  Metformin 500mg   Patient is due for next adherence delivery on: 04/16/20 Called patient and reviewed medications and coordinated delivery.  This delivery to include: Eliquis  5mg - take one tab twice daily Rosuvastatin 10mg - take one tab daily  Patient's wife stated prescriptions to be put in vials.  Confirmed delivery date of 04/16/20, advised patient that pharmacy will contact them the morning of delivery.  Georgiana Shore ,[place] Pharmacist Assistant [phone]

## 2020-04-11 ENCOUNTER — Ambulatory Visit (INDEPENDENT_AMBULATORY_CARE_PROVIDER_SITE_OTHER): Payer: Medicare HMO

## 2020-04-11 DIAGNOSIS — E785 Hyperlipidemia, unspecified: Secondary | ICD-10-CM

## 2020-04-11 DIAGNOSIS — I10 Essential (primary) hypertension: Secondary | ICD-10-CM | POA: Diagnosis not present

## 2020-04-11 DIAGNOSIS — E1169 Type 2 diabetes mellitus with other specified complication: Secondary | ICD-10-CM

## 2020-04-11 DIAGNOSIS — Z720 Tobacco use: Secondary | ICD-10-CM

## 2020-04-11 NOTE — Patient Instructions (Addendum)
[name],  Thank you for taking the time to review your medications with me today.  I have included our care plan/goals in the following pages. Please review and call me at [phone] with any questions!  Thanks! Ellin Mayhew, Pharm.D., BCGP Clinical Pharmacist [facility] [place] [phone]  Patient Care Plan: [place] Plan    Problem Identified: Atrial Fibrillation, HTN, HLD, DMII and Depression   Priority: High  Onset Date: 04/11/2020    Long-Range Goal: Disease Management   Start Date: 04/11/2020  Expected End Date: 10/08/2020  This Visit's Progress: On track  Priority: High  Note:    Current Barriers:  . Unable to maintain control of blood pressure  Pharmacist Clinical Goal(s):  Marland Kitchen Over the next 180 days, patient will maintain control of HTN as evidenced by vitals  through collaboration with PharmD and provider.   Interventions: . 1:1 collaboration with Brunetta Jeans, PA-C regarding development and update of comprehensive plan of care as evidenced by provider attestation and co-signature . Inter-disciplinary care team collaboration (see longitudinal plan of care) . Comprehensive medication review performed; medication list updated in electronic medical record  Hypertension (BP goal <130/80) -Uncontrolled -current smoker, HTN, DMII -GFRs 50s-60s  -Current treatment: . Amlodipine 10 mg once daily  (02/14/2020) . HCTZ 12.5 mg once daily (restarted 02/23/2020) . Losartan 100 mg once daily  -Medications previously tried: HCTZ 25 mg, losartan 50 mg, amlodipine 5 mg, metoprolol tartrate 50mg  BID-25 mg (bradycardia)  -Current home readings: 165/89 80 (03/26/2020), 146/85 85 (03/27/2020), 177/75 89 (03/28/2020), 161/85 77 (03/29/2020), 155/86 82 (03/30/2020), 159/93 76 (03/31/2020) 165/83 81 (04/02/2020) -Denies hypotensive/hypertensive symptoms -Educated on BP goals and benefits of medications for prevention of heart  attack, stroke and kidney damage; Daily salt intake goal < 2300 mg; Exercise goal of 150 minutes per week; Importance of home blood pressure monitoring; -Counseled to monitor BP at home twice daily testing with 2 readings 30 minutes before am meds and 30 minutes before bed, document, and provide log at future appointments -Counseled on diet and exercise extensively Recommended switch from losartan 100mg  daily to longer acting ARB- telmisartan 80 mg daily due to persistent elevations in morning BP   Hyperlipidemia: (LDL goal < 70) -Not ideally controlled -Will need updated labs with TOC  -Current treatment: . Rosuvastatin 10 mg once daily  -Current dietary patterns: trying to lower processed foods and carbs  . breakfast: peanut butter and toast, coffee  . lunch: chicken, whole wheat bread, sugar meal replacement   -Current exercise habits: nothing formal, works three days per week -Educated on Benefits of statin for ASCVD risk reduction; Importance of limiting foods high in cholesterol; Exercise goal of 150 minutes per week; -Counseled on diet and exercise extensively Recommended to continue current medication Review higher intensity statin 06/2020  Diabetes (A1c goal <7%) -Controlled -tolerating without any issues  -Current medications: Marland Kitchen Metformin 500 mg - 1 tab with breakfast, 2 tabs with dinner -Medications previously tried: n/a -Current home glucose readings . fasting glucose: not taking  . post prandial glucose: n/a -Denies hypoglycemic/hyperglycemic symptoms -Current meal patters and exercise: see DM -Educated on A1c and blood sugar goals; Complications of diabetes including kidney damage, retinal damage, and cardiovascular disease; Exercise goal of 150 minutes per week; -Counseled to check feet daily and get yearly eye exams -Counseled on diet and exercise extensively Recommended to continue current medication  Atrial Fibrillation (Goal: prevent stroke and major  bleeding) -  Controlled -CHADSVASC: 4 (age 29 PAD DM HTN) -Current treatment: . Rate control: previously on metoprolol tartrate and down titrated - still had bradycardia at 25 BID . Anticoagulation: Eliquis 5 mg twice daily  -Home BP and HR readings: see htn   -Counseled on increased risk of stroke due to Afib and benefits of anticoagulation for stroke prevention; importance of adherence to anticoagulant exactly as prescribed; avoidance of NSAIDs due to increased bleeding risk with anticoagulants; seeking medical attention after a head injury or if there is blood in the urine/stool; -Counseled on diet and exercise extensively Recommended to continue current medication  Osteoporosis screening (Goal ensure appropriate screening) -current smoker, lower BMI, PPI, SNRI -Last DEXA Scan: not on file. -consider recommendation once TOC complete for new PCP- pt plans to stay with [facility] and are planning to call to schedule  Smoking cessation (Goal: reduce number of packs ppd to 0.25 from 0.5 over next six months) -Not ideally controlled -Current treatment  . 4 mg NRT lozenge - only using 6/day -Counseled on optimization of lozenge to up to 20/day or 5 every six hours to help reduce packs per day Assessed willingness to formally quit - not ready at this time, open to increasing lozenge and seeing where this goes   Patient Goals/Self-Care Activities . Over the next 365 days, patient will:  - take medications as prescribed collaborate with provider on medication access solutions target a minimum of 150 minutes of moderate intensity exercise weekly      The patient verbalized understanding of instructions provided today and agreed to receive a MyChart copy of patient instruction and/or educational materials. Telephone follow up appointment with pharmacy team member scheduled for: See next appointment with "Care Management Staff" under "What's Next" below.

## 2020-04-11 NOTE — Progress Notes (Signed)
Chronic Care Management Pharmacy Note  04/11/2020 Name:  John Woodward. MRN:  [id number] DOB:  [birth year]  Subjective: John Woodward. is an 75 y.o. year old male who is a primary patient of Delorse Limber.  The CCM team was consulted for assistance with disease management and care coordination needs.  Accompanied by wife, John Woodward.   Engaged with patient by telephone for follow up visit in response to provider referral for pharmacy case management and/or care coordination services.   Consent to Services:  The patient was given information about Chronic Care Management services, agreed to services, and gave verbal consent prior to initiation of services.  Please see initial visit note for detailed documentation.   Patient Care Team: Delorse Limber as PCP - General (Family Medicine) Gerda Diss, DO as Consulting Physician (Family Medicine) Marlou Sa Tonna Corner, MD as Consulting Physician (Orthopedic Surgery) Madelin Rear, [facility] as Pharmacist (Pharmacist)  Recent office visits: 02/16/2020 Rayetta Pigg, [place]): metoprolol tartrate 25 mg twice daily discontinued. Had been reduced from 50 mg 2 weeks prior along with addition of amlodipine 10 mg and change in crestor 10 mg TIW to daily. Patient has been monitoring blood pressure at home and brings with him a written log with readings averaging 170-180/80-90 mmHg and heart rate 50s bpm.  1/28 f/u call Advised to restart hydrochlorothiazide at 12.5 mg once daily   Objective: Lab Results  Component Value Date   CREATININE 1.19 02/29/2020   CREATININE 1.16 02/02/2020   CREATININE 1.23 12/14/2019   GFR 60.32 02/29/2020   GFR 58.06 (L) 12/14/2019   GFRNONAA 45 (L) 12/05/2019   GFRNONAA 50 (L) 12/04/2019  Last diabetic Eye exam:  Lab Results  Component Value Date/Time   HMDIABEYEEXA No Retinopathy 02/15/2020 12:00 AM   Lab Results  Component Value Date   HGBA1C 6.2 (H) 12/03/2019   Last diabetic Foot exam: No  results found for: HMDIABFOOTEX  Lab Results  Component Value Date   CHOL 158 12/04/2019   CHOL 216 (H) 08/24/2019   TRIG 129 12/04/2019   TRIG 212.0 (H) 08/24/2019   HDL 31 (L) 12/04/2019   HDL 37.70 (L) 08/24/2019   CHOLHDL 5.1 12/04/2019   CHOLHDL 6 08/24/2019   VLDL 26 12/04/2019   VLDL 42.4 (H) 08/24/2019   LDLCALC 101 (H) 12/04/2019   LDLCALC 132 (H) 07/14/2018   LDLDIRECT 139.0 08/24/2019   LDLDIRECT 111.0 10/30/2016   Hepatic Function Latest Ref Rng & Units 12/05/2019 12/04/2019 12/03/2019  Total Protein 6.5 - 8.1 g/dL 6.3(L) 6.2(L) 6.2(L)  Albumin 3.5 - 5.0 g/dL 3.0(L) 3.1(L) 3.2(L)  AST 15 - 41 U/L _0 ALT 0 - 44 U/L _1 Alk Phosphatase 38 - 126 U/L 55 55 54  Total Bilirubin 0.3 - 1.2 mg/dL 1.0 0.7 1.0   Lab Results  Component Value Date/Time   TSH 0.973 12/04/2019 03:29 AM   TSH 2.605 12/03/2019 12:28 PM   TSH 3.32 06/15/2018 11:44 AM   FREET4 1.03 12/03/2019 12:28 PM   CBC Latest Ref Rng & Units 12/14/2019 12/05/2019 12/04/2019  WBC 4.0 - 10.5 K/uL 7.7 3.5(L) 4.9  Hemoglobin 13.0 - 17.0 g/dL 14.1 16.3 15.4  Hematocrit 39.0 - 52.0 % 41.5 48.9 44.4  Platelets 150.0 - 400.0 K/uL 399.0 137(L) 143(L)   No results found for: VD25OH  Clinical ASCVD: No  The 10-year ASCVD risk score Mikey Bussing DC Jr., et al., 2013) is: 74.9%   Values used to calculate the  score:     Age: 26 years     Sex: Male     Is Non-Hispanic African American: No     Diabetic: Yes     Tobacco smoker: Yes     Systolic Blood Pressure: 283 mmHg     Is BP treated: Yes     HDL Cholesterol: 31 mg/dL     Total Cholesterol: 158 mg/dL    -[place] 02/2018: AAA screening- ectatic, repeat in 5 years.  -ABI 12/29/2019: This exam reveals moderately decreased perfusion of the right lower extremity, noted at the post tibial artery level (ABI 0.51) and severely decreased perfusion of the left lower extremity, noted at the dorsalis pedis and post tibial artery level (ABI 0.32). Right AT appears to be occluded  with dampened waveform. There is severely abnormal monophasic waveform of all vessels at the level of the ankle.  -ECHO 11/2019: Left ventricular ejection fraction, by estimation, is 60 to 65%. The left ventricle has normal function -Stress test 12/27/2019: normal. Low risk.   Social History   Tobacco Use  Smoking Status Current Every Day Smoker  . Packs/day: 0.25  . Years: 50.00  . Pack years: 12.50  . Types: Cigarettes  . Start date: 08/12/1966  Smokeless Tobacco Never Used  Tobacco Comment   Taking Nicotine lozenges   BP Readings from Last 3 Encounters:  02/16/20 (!) 182/78  02/05/20 (!) 140/92  02/01/20 (!) 182/90   Pulse Readings from Last 3 Encounters:  02/16/20 (!) 50  02/05/20 (!) 54  02/01/20 (!) 50   Wt Readings from Last 3 Encounters:  02/16/20 150 lb (68 kg)  02/05/20 151 lb (68.5 kg)  02/01/20 154 lb (69.9 kg)   Assessment: Review of patient past medical history, allergies, medications, health status, including review of consultants reports, laboratory and other test data, was performed as part of comprehensive evaluation and provision of chronic care management services.   SDOH:  (Social Determinants of Health) assessments and interventions performed:   CCM Care Plan Allergies  Allergen Reactions  . Antibacterial Hand Soap [Triclosan] Other (See Comments)    Caused water blisters   Medications Reviewed Today    Reviewed by Verneda Skill (Physician Assistant Certified) on 15/17/61 at 1011  Med List Status: <None>  Medication Order Taking? Sig Documenting Provider Last Dose Status Informant  amLODipine (NORVASC) 10 MG tablet [id number] Yes Take 1 tablet (10 mg total) by mouth daily. Cantwell, Celeste C, PA-C Taking Active   apixaban (ELIQUIS) 5 MG TABS tablet [id number] Yes Take 1 tablet (5 mg total) by mouth 2 (two) times daily. Cantwell, Celeste C, PA-C Taking Active   escitalopram (LEXAPRO) 10 MG tablet [id number] Yes Take 1 tablet (10 mg total) by  mouth daily. Brunetta Jeans, PA-C Taking Active   losartan (COZAAR) 100 MG tablet [id number] Yes Take 1 tablet (100 mg total) by mouth daily. Cantwell, Celeste C, PA-C Taking Active   metFORMIN (GLUCOPHAGE) 500 MG tablet [id number] Yes TAKE ONE TABLET BY MOUTH EVERY MORNING and TAKE TWO TABLETS BY MOUTH EVERY EVENING Brunetta Jeans, PA-C Taking Active   nicotine polacrilex (COMMIT) 4 MG lozenge [id number] Yes Take 4 mg by mouth as needed for smoking cessation. [provider] Taking Active Spouse/Significant Other  rosuvastatin (CRESTOR) 10 MG tablet [id number] Yes Take 1 tablet (10 mg total) by mouth daily. Alethia Berthold, PA-C Taking Active          Patient Active Problem List   Diagnosis Date Noted  .  A-fib ([place]) 12/03/2019  . Pulmonary emphysema ([place]) 09/22/2018  . Encounter for screening for malignant neoplasm of respiratory organs 02/07/2018  . Screening for AAA (abdominal aortic aneurysm) 02/07/2018  . Skin cancer screening 02/07/2018  . Need for 23-polyvalent pneumococcal polysaccharide vaccine 02/07/2018  . Encounter for immunization 02/07/2018  . Visit for preventive health examination 02/07/2018  . Hx of colonic polyps 02/23/2017  . Hyperlipidemia associated with type 2 diabetes mellitus ([place]) 10/02/2016  . Diabetes mellitus type II, uncontrolled ([place]) 09/18/2016  . Encounter for Medicare annual wellness exam 09/16/2016  . Special screening examination for viral disease 09/16/2016  . Annual physical exam 09/16/2016  . Prostate cancer screening 09/16/2016  . Tobacco abuse disorder 08/11/2016  . Essential hypertension 08/11/2016   Immunization History  Administered Date(s) Administered  . Fluad Quad(high Dose 65+) 10/19/2018  . Influenza, High Dose Seasonal PF 10/30/2016, 02/04/2018  . PFIZER(Purple Top)SARS-COV-2 Vaccination 03/26/2019, 04/26/2019  . Pneumococcal Conjugate-13 09/10/2016  . Pneumococcal Polysaccharide-23 02/04/2018   Conditions to be  addressed/monitored: Atrial Fibrillation, HTN, HLD, DMII and Depression  Care Plan : [place]  Updates made by Madelin Rear, [facility] since 04/11/2020 12:00 AM    Problem: Atrial Fibrillation, HTN, HLD, DMII and Depression   Priority: High  Onset Date: 04/11/2020    Long-Range Goal: Disease Management   Start Date: 04/11/2020  Expected End Date: 10/08/2020  This Visit's Progress: On track  Priority: High  Note:    Current Barriers:  . Unable to maintain control of blood pressure  Pharmacist Clinical Goal(s):  Marland Kitchen Over the next 180 days, patient will maintain control of HTN as evidenced by vitals  through collaboration with PharmD and provider.   Interventions: . 1:1 collaboration with Brunetta Jeans, PA-C regarding development and update of comprehensive plan of care as evidenced by provider attestation and co-signature . Inter-disciplinary care team collaboration (see longitudinal plan of care) . Comprehensive medication review performed; medication list updated in electronic medical record  Hypertension (BP goal <130/80) -Uncontrolled -current smoker, HTN, DMII -GFRs 50s-60s  -Current treatment: . Amlodipine 10 mg once daily  (02/14/2020) . HCTZ 12.5 mg once daily (restarted 02/23/2020) . Losartan 100 mg once daily  -Medications previously tried: HCTZ 25 mg, losartan 50 mg, amlodipine 5 mg, metoprolol tartrate 58m BID-25 mg (bradycardia)  -Current home readings: 165/89 80 (03/26/2020), 146/85 85 (03/27/2020), 177/75 89 (03/28/2020), 161/85 77 (03/29/2020), 155/86 82 (03/30/2020), 159/93 76 (03/31/2020) 165/83 81 (04/02/2020) -Denies hypotensive/hypertensive symptoms -Educated on BP goals and benefits of medications for prevention of heart attack, stroke and kidney damage; Daily salt intake goal < 2300 mg; Exercise goal of 150 minutes per week; Importance of home blood pressure monitoring; -Counseled to monitor BP at home twice daily testing with 2 readings 30 minutes before am  meds and 30 minutes before bed, document, and provide log at future appointments -Counseled on diet and exercise extensively Recommended switch from losartan 1059mdaily to longer acting ARB- telmisartan 80 mg daily due to persistent elevations in morning BP   Hyperlipidemia: (LDL goal < 70) -Not ideally controlled -Will need updated labs with TOC  -Current treatment: . Rosuvastatin 10 mg once daily  -Current dietary patterns: trying to lower processed foods and carbs  . breakfast: peanut butter and toast, coffee  . lunch: chicken, whole wheat bread, sugar meal replacement   -Current exercise habits: nothing formal, works three days per week -Educated on Benefits of statin for ASCVD risk reduction; Importance of limiting foods high in cholesterol; Exercise  goal of 150 minutes per week; -Counseled on diet and exercise extensively Recommended to continue current medication Review higher intensity statin 06/2020  Diabetes (A1c goal <7%) -Controlled -tolerating without any issues  -Current medications: Marland Kitchen Metformin 500 mg - 1 tab with breakfast, 2 tabs with dinner -Medications previously tried: n/a -Current home glucose readings . fasting glucose: not taking  . post prandial glucose: n/a -Denies hypoglycemic/hyperglycemic symptoms -Current meal patters and exercise: see DM -Educated on A1c and blood sugar goals; Complications of diabetes including kidney damage, retinal damage, and cardiovascular disease; Exercise goal of 150 minutes per week; -Counseled to check feet daily and get yearly eye exams -Counseled on diet and exercise extensively Recommended to continue current medication  Atrial Fibrillation (Goal: prevent stroke and major bleeding) -Controlled -CHADSVASC: 4 (age 38 PAD DM HTN) -Current treatment: . Rate control: previously on metoprolol tartrate and down titrated - still had bradycardia at 25 BID . Anticoagulation: Eliquis 5 mg twice daily  -Home BP and HR  readings: see htn   -Counseled on increased risk of stroke due to Afib and benefits of anticoagulation for stroke prevention; importance of adherence to anticoagulant exactly as prescribed; avoidance of NSAIDs due to increased bleeding risk with anticoagulants; seeking medical attention after a head injury or if there is blood in the urine/stool; -Counseled on diet and exercise extensively Recommended to continue current medication  Osteoporosis screening (Goal ensure appropriate screening) -current smoker, lower BMI, PPI, SNRI -Last DEXA Scan: not on file. -consider recommendation once TOC complete for new PCP- pt plans to stay with [facility] and are planning to call to schedule  Smoking cessation (Goal: reduce number of packs ppd to 0.25 from 0.5 over next six months) -Not ideally controlled -Current treatment  . 4 mg NRT lozenge - only using 6/day -Counseled on optimization of lozenge to up to 20/day or 5 every six hours to help reduce packs per day Assessed willingness to formally quit - not ready at this time, open to increasing lozenge and seeing where this goes   Patient Goals/Self-Care Activities . Over the next 365 days, patient will:  - take medications as prescribed collaborate with provider on medication access solutions target a minimum of 150 minutes of moderate intensity exercise weekly    Medication Assistance: Utilizing [facility].  Patient's preferred pharmacy is:  [facility] - [place], [place] - [address] [place] [address] Phone: [phone] Fax: [phone]  [facility] [address], [place] - [address] [place] [address] [place] [address] Phone: [phone] Fax: [place], [place] [address] [place] [address] Phone: [phone]  Fax: [phone]  Follow Up:  Patient agrees to Care Plan and Follow-up. Plan: Telephone follow up appointment with care management team member scheduled for:  06/2020   Future Appointments  Date Time Provider [place]  07/04/2020 10:00 AM [facility] CCM PHARMACIST [facility]   Madelin Rear, Pharm.D., BCGP Clinical Pharmacist [facility] [place] [phone]

## 2020-04-16 ENCOUNTER — Other Ambulatory Visit: Payer: Self-pay | Admitting: Student

## 2020-04-16 MED ORDER — TELMISARTAN 80 MG PO TABS: 80.0000 mg | ORAL_TABLET | Freq: Every day | ORAL | 3 refills | Status: DC

## 2020-04-16 NOTE — Progress Notes (Signed)
Patient's blood pressure remains elevated, he is working with clinical pharmacist at PCPs office to would like to try switching from losartan to telmisartan.  I agree in this plan of action, therefore I have sent telmisartan 80 mg daily.  I have advised patient to discontinue losartan.  Plan seen in the office in follow-up.

## 2020-04-25 DIAGNOSIS — I4891 Unspecified atrial fibrillation: Secondary | ICD-10-CM | POA: Diagnosis not present

## 2020-04-25 DIAGNOSIS — E785 Hyperlipidemia, unspecified: Secondary | ICD-10-CM | POA: Diagnosis not present

## 2020-04-25 DIAGNOSIS — E1151 Type 2 diabetes mellitus with diabetic peripheral angiopathy without gangrene: Secondary | ICD-10-CM | POA: Diagnosis not present

## 2020-04-25 DIAGNOSIS — M1990 Unspecified osteoarthritis, unspecified site: Secondary | ICD-10-CM | POA: Diagnosis not present

## 2020-04-25 DIAGNOSIS — D6869 Other thrombophilia: Secondary | ICD-10-CM | POA: Diagnosis not present

## 2020-04-25 DIAGNOSIS — R69 Illness, unspecified: Secondary | ICD-10-CM | POA: Diagnosis not present

## 2020-04-25 DIAGNOSIS — G3184 Mild cognitive impairment, so stated: Secondary | ICD-10-CM | POA: Diagnosis not present

## 2020-04-25 DIAGNOSIS — I10 Essential (primary) hypertension: Secondary | ICD-10-CM | POA: Diagnosis not present

## 2020-04-25 DIAGNOSIS — E1140 Type 2 diabetes mellitus with diabetic neuropathy, unspecified: Secondary | ICD-10-CM | POA: Diagnosis not present

## 2020-05-06 ENCOUNTER — Telehealth: Payer: Self-pay

## 2020-05-06 NOTE — Chronic Care Management (AMB) (Signed)
    Chronic Care Management Pharmacy Assistant   Name: Daire Okimoto.  MRN: [id number] DOB: 1945-07-27  Reason for Encounter: Medication Review    Recent office visits:  n/a  Recent consult visits:  [place] visits:  None in previous 6 months  Medications: Outpatient Encounter Medications as of 05/06/2020  Medication Sig Note  . amLODipine (NORVASC) 10 MG tablet Take 1 tablet (10 mg total) by mouth daily.   Marland Kitchen apixaban (ELIQUIS) 5 MG TABS tablet Take 1 tablet (5 mg total) by mouth 2 (two) times daily.   Marland Kitchen escitalopram (LEXAPRO) 10 MG tablet Take 1 tablet (10 mg total) by mouth daily.   . hydrochlorothiazide (MICROZIDE) 12.5 MG capsule Take 1 capsule (12.5 mg total) by mouth daily.   . metFORMIN (GLUCOPHAGE) 500 MG tablet TAKE ONE TABLET BY MOUTH EVERY MORNING and TAKE TWO TABLETS BY MOUTH EVERY EVENING   . nicotine polacrilex (COMMIT) 4 MG lozenge Take 4 mg by mouth as needed for smoking cessation.   . rosuvastatin (CRESTOR) 10 MG tablet Take 1 tablet (10 mg total) by mouth daily.   Marland Kitchen telmisartan (MICARDIS) 80 MG tablet Take 1 tablet (80 mg total) by mouth daily.   . [DISCONTINUED] metoprolol tartrate (LOPRESSOR) 25 MG tablet Take 1 tablet (25 mg total) by mouth 2 (two) times daily. 02/16/2020: bradycardia   No facility-administered encounter medications on file as of 05/06/2020.   Reviewed chart for medication changes ahead of medication coordination call.  04/16/2020 [facility] switch Losartan to Telmisartan 80 mg  BP Readings from Last 3 Encounters:  02/16/20 (!) 182/78  02/05/20 (!) 140/92  02/01/20 (!) 182/90    Lab Results  Component Value Date   HGBA1C 6.2 (H) 12/03/2019     Patient obtains medications through V[facility]  90 Days   Last adherence delivery included:  Eliquis 5mg - take one tab twice daily Rosuvastatin 10mg - take one tab daily  Patient is due for next adherence delivery on: 05/10/2020 Called patient and reviewed medications and coordinated  delivery.  This delivery to include: Telmisartan 80 mg once daily Amlodipine 10 mg once daily Escitalopram 10 mg once daily   Confirmed delivery date of 05/10/2020, advised patient that pharmacy will contact them the morning of delivery.   April D Calhoun, [place] Pharmacist Assistant [phone]

## 2020-05-15 NOTE — Progress Notes (Signed)
Primary Physician/Referring:  Brunetta Jeans, PA-C  Patient ID: John Woodward., male    DOB: [birth year], 75 y.o.   MRN: [id number]  Chief Complaint  Patient presents with  . Hypertension    4 weeks   HPI:    John Woodward.  is a 75 y.o. with history of COPD, hypertension, hyperlipidemia, type II diabetes mellitus non-insulin dependent. He presented to emergency department 12/03/2019 following a syncopal episode at home. Upon admission patient went into atrial fibrillation with RVR and was found to be positive for Covid 19. Patient was started on Eliquis for anticoagulation.    Patient presents for 4-week follow-up of hypertension.  Since last visit patient has restarted hydrochlorothiazide and switch from losartan to telimesartan.  Patient currently feels well, remains relatively asymptomatic.  He states he has been exercising and working more without issue.  Denies chest pain, palpitations, dyspnea, orthopnea, PND, leg swelling, dizziness, claudication.  He brings in a written log of home blood pressure readings which remain uncontrolled.  Past Medical History:  Diagnosis Date  . Arthritis   . Closed fracture dislocation of right elbow    30 years ago -- fell out of theback of a truck  . Diabetes mellitus without complication ([place])   . Hx of colonic polyps 02/23/2017  . Hyperlipidemia   . Hypertension    Diagnosed 10 years ago. No on medication. Denies history of CAD.    Past Surgical History:  Procedure Laterality Date  . OLECRANON BURSECTOMY Right 08/18/2016   Procedure: OLECRANON BURSECTOMY;  Surgeon: Meredith Pel, MD;  Location: [facility];  Service: Orthopedics;  Laterality: Right;   Family History  Problem Relation Age of Onset  . Hypertension Mother   . Dementia Mother        54s  . Transient ischemic attack Mother        60s  . Kidney disease Father   . COPD Father   . Hypertension Brother   . Alcohol abuse Maternal Grandfather   . Mental illness  Paternal Grandmother   . Hypertension Daughter   . Healthy Son   . Learning disabilities Son   . Lung cancer Maternal Aunt   . Colon cancer Neg Hx   . Colon polyps Neg Hx   . Esophageal cancer Neg Hx   . Rectal cancer Neg Hx   . Stomach cancer Neg Hx     Social History   Tobacco Use  . Smoking status: Current Every Day Smoker    Packs/day: 0.25    Years: 50.00    Pack years: 12.50    Types: Cigarettes    Start date: 08/12/1966  . Smokeless tobacco: Never Used  . Tobacco comment: Taking Nicotine lozenges  Substance Use Topics  . Alcohol use: No   Marital Status: Married   ROS  Review of Systems  Constitutional: Negative for malaise/fatigue and weight gain.  Cardiovascular: Negative for chest pain, claudication, leg swelling, near-syncope, orthopnea, palpitations, paroxysmal nocturnal dyspnea and syncope.  Respiratory: Negative for shortness of breath.   Hematologic/Lymphatic: Does not bruise/bleed easily.  Gastrointestinal: Negative for melena.  Neurological: Negative for dizziness and weakness.    Objective  Blood pressure (!) 168/85, pulse 61, temperature 98 F (36.7 C), temperature source Temporal, resp. rate 17, height 5\' 11"  (1.803 m), weight 155 lb 3.2 oz (70.4 kg), SpO2 95 %.  Vitals with BMI 05/16/2020 05/16/2020 02/16/2020  Height - 5\' 11"  5\' 11"   Weight - 155 lbs 3 oz  150 lbs  BMI - 61.95 09.32  Systolic 671 245 809  Diastolic 85 83 78  Pulse 61 65 50      Physical Exam Vitals reviewed.  Constitutional:      Appearance: Normal appearance.  Cardiovascular:     Rate and Rhythm: Normal rate and regular rhythm.     Pulses: No decreased pulses.          Carotid pulses are 2+ on the right side and 2+ on the left side.      Radial pulses are 2+ on the right side and 2+ on the left side.       Femoral pulses are 1+ on the right side and 1+ on the left side.      Popliteal pulses are 1+ on the right side and 1+ on the left side.       Dorsalis pedis pulses are  0 on the right side and 0 on the left side.       Posterior tibial pulses are 0 on the right side and 0 on the left side.     Heart sounds: S1 normal and S2 normal. No murmur heard. No gallop.      Comments: Venous stasis skin changes bilateral lower legs and feet, left more than right. No JVD, no leg edema.   Left foot cool to touch compared to right foot.  Pulmonary:     Effort: Pulmonary effort is normal. No respiratory distress.     Breath sounds: No wheezing, rhonchi or rales.  Musculoskeletal:     Right lower leg: No edema.     Left lower leg: No edema.  Skin:    General: Skin is warm and dry.     Capillary Refill: Capillary refill takes less than 2 seconds.     Findings: No erythema, lesion or rash.  Neurological:     Mental Status: He is alert.    Laboratory examination:   Recent Labs    12/03/19 1228 12/04/19 0329 12/05/19 0127 12/14/19 1054 02/02/20 1109 02/29/20 0941  NA 138 137 137 140 143 138  K 4.4 3.5 5.1 4.3 5.4* 4.0  CL 104 104 102 105 106 101  CO2 24 22 24 27 31  33*  GLUCOSE 149* 129* 105* 150* 105* 137*  BUN 32* 31* 31* 21 22 28*  CREATININE 1.83* 1.47* 1.61* 1.23 1.16 1.19  CALCIUM 8.3* 8.3* 8.5* 8.6 9.7 9.7  GFRNONAA 38* 50* 45*  --   --   --    CrCl cannot be calculated (Patient's most recent lab result is older than the maximum 21 days allowed.).  CMP Latest Ref Rng & Units 02/29/2020 02/02/2020 12/14/2019  Glucose 70 - 99 mg/dL 137(H) 105(H) 150(H)  BUN 6 - 23 mg/dL 28(H) 22 21  Creatinine 0.40 - 1.50 mg/dL 1.19 1.16 1.23  Sodium 135 - 145 mEq/L 138 143 140  Potassium 3.5 - 5.1 mEq/L 4.0 5.4(H) 4.3  Chloride 96 - 112 mEq/L 101 106 105  CO2 19 - 32 mEq/L 33(H) 31 27  Calcium 8.4 - 10.5 mg/dL 9.7 9.7 8.6  Total Protein 6.5 - 8.1 g/dL - - -  Total Bilirubin 0.3 - 1.2 mg/dL - - -  Alkaline Phos 38 - 126 U/L - - -  AST 15 - 41 U/L - - -  ALT 0 - 44 U/L - - -   CBC Latest Ref Rng & Units 12/14/2019 12/05/2019 12/04/2019  WBC 4.0 - 10.5 K/uL 7.7  3.5(L) 4.9  Hemoglobin 13.0 - 17.0 g/dL 14.1 16.3 15.4  Hematocrit 39.0 - 52.0 % 41.5 48.9 44.4  Platelets 150.0 - 400.0 K/uL 399.0 137(L) 143(L)    Lipid Panel Recent Labs    08/24/19 0840 12/04/19 0329  CHOL 216* 158  TRIG 212.0* 129  LDLCALC  --  101*  VLDL 42.4* 26  HDL 37.70* 31*  CHOLHDL 6 5.1  LDLDIRECT 139.0  --     HEMOGLOBIN A1C Lab Results  Component Value Date   HGBA1C 6.2 (H) 12/03/2019   MPG 131.24 12/03/2019   TSH Recent Labs    12/03/19 1228 12/04/19 0329  TSH 2.605 0.973    External labs:  None   Medications and allergies   Allergies  Allergen Reactions  . Antibacterial Hand Soap [Triclosan] Other (See Comments)    Caused water blisters     Outpatient Medications Prior to Visit  Medication Sig Dispense Refill  . amLODipine (NORVASC) 10 MG tablet Take 1 tablet (10 mg total) by mouth daily. 90 tablet 1  . apixaban (ELIQUIS) 5 MG TABS tablet Take 1 tablet (5 mg total) by mouth 2 (two) times daily. 180 tablet 1  . escitalopram (LEXAPRO) 10 MG tablet Take 1 tablet (10 mg total) by mouth daily. 90 tablet 2  . hydrochlorothiazide (HYDRODIURIL) 25 MG tablet Take 1 tablet by mouth every morning.    . metFORMIN (GLUCOPHAGE) 500 MG tablet TAKE ONE TABLET BY MOUTH EVERY MORNING and TAKE TWO TABLETS BY MOUTH EVERY EVENING 90 tablet 0  . nicotine polacrilex (COMMIT) 4 MG lozenge Take 4 mg by mouth as needed for smoking cessation.    . rosuvastatin (CRESTOR) 10 MG tablet Take 1 tablet (10 mg total) by mouth daily. 30 tablet 3  . telmisartan (MICARDIS) 80 MG tablet Take 1 tablet (80 mg total) by mouth daily. 30 tablet 3  . hydrochlorothiazide (MICROZIDE) 12.5 MG capsule Take 1 capsule (12.5 mg total) by mouth daily. 90 capsule 3   No facility-administered medications prior to visit.     Radiology:   No results found.  Cardiac Studies:   ABI 12/29/2019:  This exam reveals moderately decreased perfusion of the right lower extremity, noted at the post  tibial artery level (ABI 0.51) and severely decreased perfusion of the left lower extremity, noted at the dorsalis pedis and post tibial artery level (ABI 0.32).  Right AT appears to be occluded with dampened waveform. There is severely abnormal monophasic waveform of all vessels at the level of the ankle.  PCV MYOCARDIAL PERFUSION WO LEXISCAN 12/27/2019 Nondiagnostic ECG stress due to pharmacologic stress. Resting EKG demonstrated normal sinus rhythm. Non-specific T-wave abnormalities. Peak EKG revealed no significant ST-T changes. Myocardial perfusion is normal. Overall LV systolic function is normal without regional wall motion abnormalities. Stress LV EF: 58%. No previous exam available for comparison. Low risk.  Echocardiogram 12/04/2019:  1. Patient in afib during exam.  2. Left ventricular ejection fraction, by estimation, is 60 to 65%. The left ventricle has normal function. The left ventricle has no regional wall motion abnormalities. Left ventricular diastolic parameters are indeterminate.  3. Right ventricular systolic function is normal. The right ventricular size is normal.  4. Left atrial size was mildly dilated.  5. The mitral valve is degenerative. Trivial mitral valve regurgitation. No evidence of mitral stenosis.  6. The aortic valve was not well visualized. Aortic valve regurgitation is not visualized. Mild to moderate aortic valve sclerosis/calcification is present, without any evidence of aortic stenosis.  7. The inferior  vena cava is normal in size with greater than 50% respiratory variability, suggesting right atrial pressure of 3 mmHg.  EKG:   EKG 02/16/2020: Marked sinus bradycardia at a rate of 49 bpm, left atrial enlargement.  Normal axis.  Poor R wave progression, cannot exclude anteroseptal infarct old.  Nonspecific T wave abnormality.  02/01/2020: Marked sinus bradycardia rate of 47 bpm, left atrial enlargement.  Normal axis.  Poor R wave progression, cannot  exclude anteroseptal infarct old.  12/25/2019: Sinus bradycardia at rate of 48 bpm. Normal axis. Left atrial enlargement. PRWP, cannot exclude anterolateral infarct old. Non-specific T wave abnormality.   12/04/2019: Atrial fibrillation with rapid ventricular response at a rate of 107 bpm. Normal axis, left atrial. Enlargement.   Assessment     ICD-10-CM   1. Essential hypertension  G18 Basic metabolic panel  2. Bradycardia  R00.1      Medications Discontinued During This Encounter  Medication Reason  . hydrochlorothiazide (MICROZIDE) 12.5 MG capsule Error    Meds ordered this encounter  Medications  . hydrALAZINE (APRESOLINE) 50 MG tablet    Sig: Take 1 tablet (50 mg total) by mouth 3 (three) times daily.    Dispense:  90 tablet    Refill:  3   This patients CHA2DS2-VASc Score 4 (HTN, DM, Age, PAD) and yearly risk of stroke 4.8%.  Recommendations:   Karmelo Bass. is a 75 y.o. with history of hypertension, hyperlipidemia, type II diabetes mellitus. Also with history of covid 19 infection 11/2019, and diagnosed with atrial fibrillation 12/03/2019.   Patient presents for 4-week follow-up of hypertension.  Since last visit patient has restarted hydrochlorothiazide and switch from losartan to telmisartan.  Patient is doing well without symptoms or specific complaints and no clinical signs of heart failure.  In regard to atrial fibrillation, patient is in sinus rhythm on exam and bradycardia has improved.  Patient is tolerating anticoagulation without bleeding diathesis, will continue Eliquis.  Regard to hypertension, patient's blood pressure remains uncontrolled.  We will add hydralazine 50 mg 3 times daily.  We will also repeat BMP.  Could consider adding chlorthalidone in the future pending renal function evaluation.  Patient will continue to monitor blood pressure on a daily basis and notify our office in 3 weeks if it remains >140/90 mmHg.  Will continue to avoid AV nodal  blocking agents in view of bradycardia.  Follow-up in 6 weeks, sooner if needed, for hypertension.   Alethia Berthold, PA-C 05/16/2020, 1:42 PM Office: [phone]

## 2020-05-16 ENCOUNTER — Encounter: Payer: Self-pay | Admitting: Student

## 2020-05-16 ENCOUNTER — Ambulatory Visit: Payer: Medicare HMO | Admitting: Student

## 2020-05-16 ENCOUNTER — Other Ambulatory Visit: Payer: Self-pay

## 2020-05-16 VITALS — BP 168/85 | HR 61 | Temp 98.0°F | Resp 17 | Ht 71.0 in | Wt 155.2 lb

## 2020-05-16 DIAGNOSIS — I10 Essential (primary) hypertension: Secondary | ICD-10-CM

## 2020-05-16 DIAGNOSIS — R001 Bradycardia, unspecified: Secondary | ICD-10-CM | POA: Diagnosis not present

## 2020-05-16 MED ORDER — HYDRALAZINE HCL 50 MG PO TABS: 50.0000 mg | ORAL_TABLET | Freq: Three times a day (TID) | ORAL | 3 refills | Status: DC

## 2020-06-04 ENCOUNTER — Encounter: Payer: Self-pay | Admitting: Registered Nurse

## 2020-06-04 ENCOUNTER — Other Ambulatory Visit: Payer: Self-pay

## 2020-06-04 ENCOUNTER — Ambulatory Visit (INDEPENDENT_AMBULATORY_CARE_PROVIDER_SITE_OTHER): Payer: Medicare HMO | Admitting: Registered Nurse

## 2020-06-04 VITALS — BP 142/58 | HR 66 | Temp 97.7°F | Resp 17 | Ht 71.0 in | Wt 154.6 lb

## 2020-06-04 DIAGNOSIS — E1165 Type 2 diabetes mellitus with hyperglycemia: Secondary | ICD-10-CM | POA: Diagnosis not present

## 2020-06-04 DIAGNOSIS — R413 Other amnesia: Secondary | ICD-10-CM

## 2020-06-04 DIAGNOSIS — E785 Hyperlipidemia, unspecified: Secondary | ICD-10-CM | POA: Diagnosis not present

## 2020-06-04 DIAGNOSIS — E1169 Type 2 diabetes mellitus with other specified complication: Secondary | ICD-10-CM

## 2020-06-04 DIAGNOSIS — Z1211 Encounter for screening for malignant neoplasm of colon: Secondary | ICD-10-CM

## 2020-06-04 LAB — CBC WITH DIFFERENTIAL/PLATELET
Basophils Absolute: 0.1 10*3/uL (ref 0.0–0.1)
Basophils Relative: 0.9 % (ref 0.0–3.0)
Eosinophils Absolute: 0.3 10*3/uL (ref 0.0–0.7)
Eosinophils Relative: 5.1 % — ABNORMAL HIGH (ref 0.0–5.0)
HCT: 45.6 % (ref 39.0–52.0)
Hemoglobin: 15.5 g/dL (ref 13.0–17.0)
Lymphocytes Relative: 16.6 % (ref 12.0–46.0)
Lymphs Abs: 1 10*3/uL (ref 0.7–4.0)
MCHC: 34 g/dL (ref 30.0–36.0)
MCV: 96.7 fl (ref 78.0–100.0)
Monocytes Absolute: 0.3 10*3/uL (ref 0.1–1.0)
Monocytes Relative: 5.3 % (ref 3.0–12.0)
Neutro Abs: 4.4 10*3/uL (ref 1.4–7.7)
Neutrophils Relative %: 72.1 % (ref 43.0–77.0)
Platelets: 249 10*3/uL (ref 150.0–400.0)
RBC: 4.71 Mil/uL (ref 4.22–5.81)
RDW: 13.3 % (ref 11.5–15.5)
WBC: 6.2 10*3/uL (ref 4.0–10.5)

## 2020-06-04 LAB — COMPREHENSIVE METABOLIC PANEL
ALT: 15 U/L (ref 0–53)
AST: 18 U/L (ref 0–37)
Albumin: 4.7 g/dL (ref 3.5–5.2)
Alkaline Phosphatase: 68 U/L (ref 39–117)
BUN: 35 mg/dL — ABNORMAL HIGH (ref 6–23)
CO2: 24 mEq/L (ref 19–32)
Calcium: 9.7 mg/dL (ref 8.4–10.5)
Chloride: 101 mEq/L (ref 96–112)
Creatinine, Ser: 1.31 mg/dL (ref 0.40–1.50)
GFR: 53.65 mL/min — ABNORMAL LOW (ref >60.00)
Glucose, Bld: 116 mg/dL — ABNORMAL HIGH (ref 70–99)
Potassium: 4.3 mEq/L (ref 3.5–5.1)
Sodium: 138 mEq/L (ref 135–145)
Total Bilirubin: 0.5 mg/dL (ref 0.2–1.2)
Total Protein: 7.7 g/dL (ref 6.0–8.3)

## 2020-06-04 LAB — LIPID PANEL
Cholesterol: 152 mg/dL (ref 0–200)
HDL: 42.8 mg/dL (ref >39.00)
NonHDL: 109.54
Total CHOL/HDL Ratio: 4
Triglycerides: 230 mg/dL — ABNORMAL HIGH (ref 0.0–149.0)
VLDL: 46 mg/dL — ABNORMAL HIGH (ref 0.0–40.0)

## 2020-06-04 LAB — LDL CHOLESTEROL, DIRECT: Direct LDL: 78 mg/dL

## 2020-06-04 LAB — HEMOGLOBIN A1C: Hgb A1c MFr Bld: 6.6 % — ABNORMAL HIGH (ref 4.6–6.5)

## 2020-06-04 NOTE — Patient Instructions (Signed)
° ° ° °  If you have lab work done today you will be contacted with your lab results within the next 2 weeks.  If you have not heard from us then please contact us. The fastest way to get your results is to register for My Chart. ° ° °IF you received an x-ray today, you will receive an invoice from [facility]. Please contact [facility] at [phone] with questions or concerns regarding your invoice.  ° °IF you received labwork today, you will receive an invoice from [facility]. Please contact [facility] at [phone] with questions or concerns regarding your invoice.  ° °Our billing staff will not be able to assist you with questions regarding bills from these companies. ° °You will be contacted with the lab results as soon as they are available. The fastest way to get your results is to activate your My Chart account. Instructions are located on the last page of this paperwork. If you have not heard from us regarding the results in 2 weeks, please contact this office. °  ° ° ° °

## 2020-06-05 ENCOUNTER — Telehealth: Payer: Self-pay

## 2020-06-05 LAB — B12 AND FOLATE PANEL
Folate: 20.7 ng/mL (ref >5.9)
Vitamin B-12: 658 pg/mL (ref 211–911)

## 2020-06-05 LAB — TSH: TSH: 1.63 u[IU]/mL (ref 0.35–4.50)

## 2020-06-05 NOTE — Chronic Care Management (AMB) (Signed)
    Chronic Care Management Pharmacy Assistant   Name: John Woodward.  MRN: [id number] DOB: 1945-12-22  Reason for Encounter: Acute Medication Coordination Call  Recent office visits:  06/05/20- Maximiano Coss, NP- [facility]  Recent consult visits:  05/16/20- Rayetta Pigg, Celeste C, PA-C, hypertension follow up, started hydralazine 50 mg tid, follow up 6 weeks  Hospital visits:  None in previous 6 months  Medications: Outpatient Encounter Medications as of 06/05/2020  Medication Sig Note  . amLODipine (NORVASC) 10 MG tablet Take 1 tablet (10 mg total) by mouth daily.   Marland Kitchen apixaban (ELIQUIS) 5 MG TABS tablet Take 1 tablet (5 mg total) by mouth 2 (two) times daily.   Marland Kitchen escitalopram (LEXAPRO) 10 MG tablet Take 1 tablet (10 mg total) by mouth daily.   . hydrALAZINE (APRESOLINE) 50 MG tablet Take 1 tablet (50 mg total) by mouth 3 (three) times daily.   . hydrochlorothiazide (HYDRODIURIL) 25 MG tablet Take 1 tablet by mouth every morning.   . metFORMIN (GLUCOPHAGE) 500 MG tablet TAKE ONE TABLET BY MOUTH EVERY MORNING and TAKE TWO TABLETS BY MOUTH EVERY EVENING   . nicotine polacrilex (COMMIT) 4 MG lozenge Take 4 mg by mouth as needed for smoking cessation.   . rosuvastatin (CRESTOR) 10 MG tablet Take 1 tablet (10 mg total) by mouth daily.   Marland Kitchen telmisartan (MICARDIS) 80 MG tablet Take 1 tablet (80 mg total) by mouth daily.   . [DISCONTINUED] metoprolol tartrate (LOPRESSOR) 25 MG tablet Take 1 tablet (25 mg total) by mouth 2 (two) times daily. 02/16/2020: bradycardia   No facility-administered encounter medications on file as of 06/05/2020.    Reviewed chart for medication changes ahead of medication coordination call.  No OVs, Consults, or hospital visits since last care coordination call/Pharmacist visit. (If appropriate, list visit date, provider name)  No medication changes indicated OR if recent visit, treatment plan here.  BP Readings from Last 3 Encounters:  06/04/20 (!) 142/58   05/16/20 (!) 168/85  02/16/20 (!) 182/78    Lab Results  Component Value Date   HGBA1C 6.6 (H) 06/04/2020     Patient obtains medications through Vials  90 Days   Last adherence delivery included: Eliquis 5mg - take one tab twice daily Rosuvastatin 10mg - take one tab daily  Coordinated acute fill for Metformin 500 mg to be delivered 06/12/20  Confirmed delivery date of 06/12/20, advised patient that pharmacy will contact them the morning of delivery.    [employer] CPA, CMA

## 2020-06-06 ENCOUNTER — Other Ambulatory Visit: Payer: Self-pay | Admitting: Family Medicine

## 2020-06-06 DIAGNOSIS — E1165 Type 2 diabetes mellitus with hyperglycemia: Secondary | ICD-10-CM

## 2020-07-01 NOTE — Progress Notes (Signed)
Primary Physician/Referring:  Maximiano Coss, NP  Patient ID: John Heman., male    DOB: 15-Jun-1945, 75 y.o.   MRN: [id number]  Chief Complaint  Patient presents with  . Hypertension  . Follow-up   HPI:    John Gallo.  is a 75 y.o. with history of COPD, hypertension, hyperlipidemia, type II diabetes mellitus non-insulin dependent. He presented to emergency department 12/03/2019 following a syncopal episode at home. Upon admission patient went into atrial fibrillation with RVR and was found to be positive for Covid 19. Patient was started on Eliquis for anticoagulation.    Patient presents for 6-week follow-up of hypertension.  At last visit added hydralazine 50 mg 3 times daily.  Patient reports he is feeling well and tolerating hydralazine without issue.  Denies chest pain, palpitations, dyspnea, orthopnea, PND, leg swelling, dizziness, claudication.  Patient brings with him a written log of home blood pressure readings which are mildly elevated.  However in the office today his blood pressure is well controlled  Past Medical History:  Diagnosis Date  . Arthritis   . Closed fracture dislocation of right elbow    30 years ago -- fell out of theback of a truck  . Diabetes mellitus without complication ([place])   . Hx of colonic polyps 02/23/2017  . Hyperlipidemia   . Hypertension    Diagnosed 10 years ago. No on medication. Denies history of CAD.   . Memory loss    Past Surgical History:  Procedure Laterality Date  . OLECRANON BURSECTOMY Right 08/18/2016   Procedure: OLECRANON BURSECTOMY;  Surgeon: Meredith Pel, MD;  Location: [place];  Service: Orthopedics;  Laterality: Right;   Family History  Problem Relation Age of Onset  . Hypertension Mother   . Dementia Mother        55s  . Transient ischemic attack Mother        66s  . Kidney disease Father   . COPD Father   . Hypertension Brother   . Alcohol abuse Maternal Grandfather   . Mental illness Paternal  Grandmother   . Hypertension Daughter   . Healthy Son   . Learning disabilities Son   . Lung cancer Maternal Aunt   . Colon cancer Neg Hx   . Colon polyps Neg Hx   . Esophageal cancer Neg Hx   . Rectal cancer Neg Hx   . Stomach cancer Neg Hx     Social History   Tobacco Use  . Smoking status: Current Every Day Smoker    Packs/day: 0.25    Years: 50.00    Pack years: 12.50    Types: Cigarettes    Start date: 08/12/1966  . Smokeless tobacco: Never Used  . Tobacco comment: Taking Nicotine lozenges  Substance Use Topics  . Alcohol use: No   Marital Status: Married   ROS  Review of Systems  Constitutional: Negative for malaise/fatigue and weight gain.  Cardiovascular: Negative for chest pain, claudication, leg swelling, near-syncope, orthopnea, palpitations, paroxysmal nocturnal dyspnea and syncope.  Respiratory: Negative for shortness of breath.   Hematologic/Lymphatic: Does not bruise/bleed easily.  Gastrointestinal: Negative for melena.  Neurological: Negative for dizziness and weakness.    Objective  Blood pressure (!) 130/59, pulse 75, temperature 98.1 F (36.7 C), temperature source Temporal, resp. rate 16, height 5\' 11"  (1.803 m), weight 154 lb (69.9 kg), SpO2 95 %.  Vitals with BMI 07/02/2020 06/04/2020 05/16/2020  Height 5\' 11"  5\' 11"  -  Weight 154 lbs  154 lbs 10 oz -  BMI 56.38 75.64 -  Systolic 332 951 884  Diastolic 59 58 85  Pulse 75 66 61      Physical Exam Vitals reviewed.  Constitutional:      Appearance: Normal appearance.  Cardiovascular:     Rate and Rhythm: Normal rate and regular rhythm.     Pulses: No decreased pulses.          Carotid pulses are 2+ on the right side with bruit and 2+ on the left side.      Radial pulses are 2+ on the right side and 2+ on the left side.       Femoral pulses are 1+ on the right side and 1+ on the left side.      Popliteal pulses are 1+ on the right side and 1+ on the left side.       Dorsalis pedis pulses are 0  on the right side and 0 on the left side.       Posterior tibial pulses are 0 on the right side and 0 on the left side.     Heart sounds: S1 normal and S2 normal. No murmur heard. No gallop.      Comments: Venous stasis skin changes bilateral lower legs and feet, left more than right. No JVD, no leg edema.   Left foot cool to touch compared to right foot.  Pulmonary:     Effort: Pulmonary effort is normal. No respiratory distress.     Breath sounds: No wheezing, rhonchi or rales.  Musculoskeletal:     Right lower leg: No edema.     Left lower leg: No edema.  Skin:    General: Skin is warm and dry.     Capillary Refill: Capillary refill takes less than 2 seconds.     Findings: No erythema, lesion or rash.  Neurological:     Mental Status: He is alert.    Laboratory examination:   Recent Labs    12/03/19 1228 12/04/19 0329 12/05/19 0127 12/14/19 1054 02/02/20 1109 02/29/20 0941 06/04/20 1112  NA 138 137 137   < > 143 138 138  K 4.4 3.5 5.1   < > 5.4* 4.0 4.3  CL 104 104 102   < > 106 101 101  CO2 24 22 24    < > 31 33* 24  GLUCOSE 149* 129* 105*   < > 105* 137* 116*  BUN 32* 31* 31*   < > 22 28* 35*  CREATININE 1.83* 1.47* 1.61*   < > 1.16 1.19 1.31  CALCIUM 8.3* 8.3* 8.5*   < > 9.7 9.7 9.7  GFRNONAA 38* 50* 45*  --   --   --   --    < > = values in this interval not displayed.   CrCl cannot be calculated (Patient's most recent lab result is older than the maximum 21 days allowed.).  CMP Latest Ref Rng & Units 06/04/2020 02/29/2020 02/02/2020  Glucose 70 - 99 mg/dL 116(H) 137(H) 105(H)  BUN 6 - 23 mg/dL 35(H) 28(H) 22  Creatinine 0.40 - 1.50 mg/dL 1.31 1.19 1.16  Sodium 135 - 145 mEq/L 138 138 143  Potassium 3.5 - 5.1 mEq/L 4.3 4.0 5.4(H)  Chloride 96 - 112 mEq/L 101 101 106  CO2 19 - 32 mEq/L 24 33(H) 31  Calcium 8.4 - 10.5 mg/dL 9.7 9.7 9.7  Total Protein 6.0 - 8.3 g/dL 7.7 - -  Total Bilirubin 0.2 - 1.2  mg/dL 0.5 - -  Alkaline Phos 39 - 117 U/L 68 - -  AST 0 - 37  U/L 18 - -  ALT 0 - 53 U/L 15 - -   CBC Latest Ref Rng & Units 06/04/2020 12/14/2019 12/05/2019  WBC 4.0 - 10.5 K/uL 6.2 7.7 3.5(L)  Hemoglobin 13.0 - 17.0 g/dL 15.5 14.1 16.3  Hematocrit 39.0 - 52.0 % 45.6 41.5 48.9  Platelets 150.0 - 400.0 K/uL 249.0 399.0 137(L)    Lipid Panel Recent Labs    08/24/19 0840 12/04/19 0329 06/04/20 1112  CHOL 216* 158 152  TRIG 212.0* 129 230.0*  LDLCALC  --  101*  --   VLDL 42.4* 26 46.0*  HDL 37.70* 31* 42.80  CHOLHDL 6 5.1 4  LDLDIRECT 139.0  --  78.0    HEMOGLOBIN A1C Lab Results  Component Value Date   HGBA1C 6.6 (H) 06/04/2020   MPG 131.24 12/03/2019   TSH Recent Labs    12/03/19 1228 12/04/19 0329 06/04/20 1112  TSH 2.605 0.973 1.63    External labs:  None  Allergies   Allergies  Allergen Reactions  . Antibacterial Hand Soap [Triclosan] Other (See Comments)    Caused water blisters      Medications Prior to Visit:   Outpatient Medications Prior to Visit  Medication Sig Dispense Refill  . amLODipine (NORVASC) 10 MG tablet Take 1 tablet (10 mg total) by mouth daily. 90 tablet 1  . apixaban (ELIQUIS) 5 MG TABS tablet Take 1 tablet (5 mg total) by mouth 2 (two) times daily. 180 tablet 1  . escitalopram (LEXAPRO) 10 MG tablet Take 1 tablet (10 mg total) by mouth daily. 90 tablet 2  . hydrALAZINE (APRESOLINE) 50 MG tablet Take 1 tablet (50 mg total) by mouth 3 (three) times daily. 90 tablet 3  . hydrochlorothiazide (HYDRODIURIL) 25 MG tablet Take 1 tablet by mouth every morning.    . metFORMIN (GLUCOPHAGE) 500 MG tablet TAKE ONE TABLET BY MOUTH EVERY MORNING and TAKE TWO TABLETS BY MOUTH EVERY EVENING 90 tablet 2  . nicotine polacrilex (COMMIT) 4 MG lozenge Take 4 mg by mouth as needed for smoking cessation.    . rosuvastatin (CRESTOR) 10 MG tablet Take 1 tablet (10 mg total) by mouth daily. 30 tablet 3  . telmisartan (MICARDIS) 80 MG tablet Take 1 tablet (80 mg total) by mouth daily. 30 tablet 3   No  facility-administered medications prior to visit.     Final Medications at End of Visit    Current Meds  Medication Sig  . amLODipine (NORVASC) 10 MG tablet Take 1 tablet (10 mg total) by mouth daily.  Marland Kitchen apixaban (ELIQUIS) 5 MG TABS tablet Take 1 tablet (5 mg total) by mouth 2 (two) times daily.  Marland Kitchen escitalopram (LEXAPRO) 10 MG tablet Take 1 tablet (10 mg total) by mouth daily.  . hydrALAZINE (APRESOLINE) 50 MG tablet Take 1 tablet (50 mg total) by mouth 3 (three) times daily.  . hydrochlorothiazide (HYDRODIURIL) 25 MG tablet Take 1 tablet by mouth every morning.  . metFORMIN (GLUCOPHAGE) 500 MG tablet TAKE ONE TABLET BY MOUTH EVERY MORNING and TAKE TWO TABLETS BY MOUTH EVERY EVENING  . nicotine polacrilex (COMMIT) 4 MG lozenge Take 4 mg by mouth as needed for smoking cessation.  . rosuvastatin (CRESTOR) 10 MG tablet Take 1 tablet (10 mg total) by mouth daily.  Marland Kitchen telmisartan (MICARDIS) 80 MG tablet Take 1 tablet (80 mg total) by mouth daily.  Radiology:   No results found.  Cardiac Studies:   ABI 12/29/2019:  This exam reveals moderately decreased perfusion of the right lower extremity, noted at the post tibial artery level (ABI 0.51) and severely decreased perfusion of the left lower extremity, noted at the dorsalis pedis and post tibial artery level (ABI 0.32).  Right AT appears to be occluded with dampened waveform. There is severely abnormal monophasic waveform of all vessels at the level of the ankle.  PCV MYOCARDIAL PERFUSION WO LEXISCAN 12/27/2019 Nondiagnostic ECG stress due to pharmacologic stress. Resting EKG demonstrated normal sinus rhythm. Non-specific T-wave abnormalities. Peak EKG revealed no significant ST-T changes. Myocardial perfusion is normal. Overall LV systolic function is normal without regional wall motion abnormalities. Stress LV EF: 58%. No previous exam available for comparison. Low risk.  Echocardiogram 12/04/2019:  1. Patient in afib during exam.   2. Left ventricular ejection fraction, by estimation, is 60 to 65%. The left ventricle has normal function. The left ventricle has no regional wall motion abnormalities. Left ventricular diastolic parameters are indeterminate.  3. Right ventricular systolic function is normal. The right ventricular size is normal.  4. Left atrial size was mildly dilated.  5. The mitral valve is degenerative. Trivial mitral valve regurgitation. No evidence of mitral stenosis.  6. The aortic valve was not well visualized. Aortic valve regurgitation is not visualized. Mild to moderate aortic valve sclerosis/calcification is present, without any evidence of aortic stenosis.  7. The inferior vena cava is normal in size with greater than 50% respiratory variability, suggesting right atrial pressure of 3 mmHg.  EKG:   EKG 02/16/2020: Marked sinus bradycardia at a rate of 49 bpm, left atrial enlargement.  Normal axis.  Poor R wave progression, cannot exclude anteroseptal infarct old.  Nonspecific T wave abnormality.  02/01/2020: Marked sinus bradycardia rate of 47 bpm, left atrial enlargement.  Normal axis.  Poor R wave progression, cannot exclude anteroseptal infarct old.  12/25/2019: Sinus bradycardia at rate of 48 bpm. Normal axis. Left atrial enlargement. PRWP, cannot exclude anterolateral infarct old. Non-specific T wave abnormality.   12/04/2019: Atrial fibrillation with rapid ventricular response at a rate of 107 bpm. Normal axis, left atrial. Enlargement.   Assessment     ICD-10-CM   1. Essential hypertension  I10   2. Left carotid bruit  R09.89 PCV CAROTID DUPLEX (BILATERAL)  3. Paroxysmal atrial fibrillation (HCC)  I48.0   4. Peripheral arterial disease (HCC)  I73.9      There are no discontinued medications.  No orders of the defined types were placed in this encounter.  This patients CHA2DS2-VASc Score 4 (HTN, DM, Age, PAD) and yearly risk of stroke 4.8%.  Recommendations:   John Nygard. is a 75 y.o. with history of hypertension, hyperlipidemia, type II diabetes mellitus. Also with history of covid 19 infection 11/2019, and diagnosed with atrial fibrillation 12/03/2019.   Patient presents for 6-week follow-up of hypertension.  At last visit added hydralazine 50 mg 3 times daily.  Patient's blood pressure is now well controlled, therefore will not make changes to antihypertensive medications at this time.  He is tolerating present medications well without issue.  We will continue to avoid AV nodal blocking agents in view of history of bradycardia on metoprolol.  If additional antihypertensive medications are needed, could consider adding chlorthalidone in the future pending renal function evaluation.  On exam today noted new left carotid artery bruit.  Will obtain bilateral carotid artery duplex to further evaluate.  In  regard to PAD patient has no symptoms of claudication, no rest pain, and no nonhealing wounds or ulcers.  Given that he remains asymptomatic we will continue medical management of PAD at this time.  Regard to paroxysmal atrial fibrillation, patient is tolerating anticoagulation without bleeding diathesis, will continue this given CHA2DS2-VASc score 4.  Follow-up in 6 months, sooner if needed, for hypertension, hyperlipidemia, PAD, and results of carotid artery duplex.   Alethia Berthold, PA-C 07/02/2020, 10:17 AM Office: [phone]

## 2020-07-02 ENCOUNTER — Ambulatory Visit: Payer: Medicare HMO | Admitting: Student

## 2020-07-02 ENCOUNTER — Encounter: Payer: Self-pay | Admitting: Student

## 2020-07-02 ENCOUNTER — Other Ambulatory Visit: Payer: Self-pay

## 2020-07-02 VITALS — BP 130/59 | HR 75 | Temp 98.1°F | Resp 16 | Ht 71.0 in | Wt 154.0 lb

## 2020-07-02 DIAGNOSIS — R0989 Other specified symptoms and signs involving the circulatory and respiratory systems: Secondary | ICD-10-CM | POA: Diagnosis not present

## 2020-07-02 DIAGNOSIS — I10 Essential (primary) hypertension: Secondary | ICD-10-CM | POA: Diagnosis not present

## 2020-07-02 DIAGNOSIS — I480 Paroxysmal atrial fibrillation: Secondary | ICD-10-CM | POA: Diagnosis not present

## 2020-07-02 DIAGNOSIS — I739 Peripheral vascular disease, unspecified: Secondary | ICD-10-CM

## 2020-07-03 ENCOUNTER — Telehealth: Payer: Medicare HMO

## 2020-07-04 ENCOUNTER — Telehealth: Payer: Medicare HMO

## 2020-07-04 ENCOUNTER — Other Ambulatory Visit: Payer: Self-pay | Admitting: Student

## 2020-07-04 DIAGNOSIS — E1169 Type 2 diabetes mellitus with other specified complication: Secondary | ICD-10-CM

## 2020-07-04 DIAGNOSIS — E785 Hyperlipidemia, unspecified: Secondary | ICD-10-CM

## 2020-07-10 ENCOUNTER — Telehealth: Payer: Medicare HMO

## 2020-07-17 ENCOUNTER — Other Ambulatory Visit: Payer: Self-pay

## 2020-07-24 ENCOUNTER — Ambulatory Visit (INDEPENDENT_AMBULATORY_CARE_PROVIDER_SITE_OTHER): Payer: Medicare HMO

## 2020-07-24 DIAGNOSIS — I10 Essential (primary) hypertension: Secondary | ICD-10-CM

## 2020-07-24 DIAGNOSIS — E1165 Type 2 diabetes mellitus with hyperglycemia: Secondary | ICD-10-CM | POA: Diagnosis not present

## 2020-07-24 NOTE — Progress Notes (Signed)
Chronic Care Management Pharmacy Note  07/24/2020 Name:  John Woodward. MRN:  [id number] DOB:  Dec 30, 1945  Recommendations/Changes made from today's visit: No Rx changes ________________ Subjective: John Heman. is an 75 y.o. year old male who is a primary patient of Maximiano Coss, NP.  The CCM team was consulted for assistance with disease management and care coordination needs.    Engaged with patient by telephone for follow up visit in response to provider referral for pharmacy case management and/or care coordination services.   Consent to Services:  The patient was given information about Chronic Care Management services, agreed to services, and gave verbal consent prior to initiation of services.  Please see initial visit note for detailed documentation.   Patient Care Team: Maximiano Coss, NP as PCP - General (Adult Health Nurse Practitioner) Gerda Diss, DO as Consulting Physician (Family Medicine) Marlou Sa Tonna Corner, MD as Consulting Physician (Orthopedic Surgery) Madelin Rear, [facility] as Pharmacist (Pharmacist)  Objective:  Lab Results  Component Value Date   CREATININE 1.31 06/04/2020   CREATININE 1.19 02/29/2020   CREATININE 1.16 02/02/2020    Lab Results  Component Value Date   HGBA1C 6.6 (H) 06/04/2020   Last diabetic Eye exam:  Lab Results  Component Value Date/Time   HMDIABEYEEXA No Retinopathy 02/15/2020 12:00 AM    Last diabetic Foot exam: No results found for: HMDIABFOOTEX      Component Value Date/Time   CHOL 152 06/04/2020 1112   TRIG 230.0 (H) 06/04/2020 1112   HDL 42.80 06/04/2020 1112   CHOLHDL 4 06/04/2020 1112   VLDL 46.0 (H) 06/04/2020 1112   LDLCALC 101 (H) 12/04/2019 0329   LDLDIRECT 78.0 06/04/2020 1112    Hepatic Function Latest Ref Rng & Units 06/04/2020 12/05/2019 12/04/2019  Total Protein 6.0 - 8.3 g/dL 7.7 6.3(L) 6.2(L)  Albumin 3.5 - 5.2 g/dL 4.7 3.0(L) 3.1(L)  AST 0 - 37 U/L _0 ALT 0 - 53 U/L _1 Alk Phosphatase 39 - 117 U/L 68 55 55  Total Bilirubin 0.2 - 1.2 mg/dL 0.5 1.0 0.7    Lab Results  Component Value Date/Time   TSH 1.63 06/04/2020 11:12 AM   TSH 0.973 12/04/2019 03:29 AM   TSH 2.605 12/03/2019 12:28 PM   TSH 3.32 06/15/2018 11:44 AM   FREET4 1.03 12/03/2019 12:28 PM    CBC Latest Ref Rng & Units 06/04/2020 12/14/2019 12/05/2019  WBC 4.0 - 10.5 K/uL 6.2 7.7 3.5(L)  Hemoglobin 13.0 - 17.0 g/dL 15.5 14.1 16.3  Hematocrit 39.0 - 52.0 % 45.6 41.5 48.9  Platelets 150.0 - 400.0 K/uL 249.0 399.0 137(L)    No results found for: VD25OH  Clinical ASCVD:  The 10-year ASCVD risk score Mikey Bussing DC Jr., et al., 2013) is: 48.3%   Values used to calculate the score:     Age: 69 years     Sex: Male     Is Non-Hispanic African American: No     Diabetic: Yes     Tobacco smoker: Yes     Systolic Blood Pressure: 809 mmHg     Is BP treated: Yes     HDL Cholesterol: 42.8 mg/dL     Total Cholesterol: 152 mg/dL    Social History   Tobacco Use  Smoking Status Every Day   Packs/day: 0.25   Years: 50.00   Pack years: 12.50   Types: Cigarettes   Start date: 08/12/1966  Smokeless Tobacco Never  Tobacco  Comments   Taking Nicotine lozenges   BP Readings from Last 3 Encounters:  07/02/20 (!) 130/59  06/04/20 (!) 142/58  05/16/20 (!) 168/85   Pulse Readings from Last 3 Encounters:  07/02/20 75  06/04/20 66  05/16/20 61   Wt Readings from Last 3 Encounters:  07/02/20 154 lb (69.9 kg)  06/04/20 154 lb 9.6 oz (70.1 kg)  05/16/20 155 lb 3.2 oz (70.4 kg)    Assessment: Review of patient past medical history, allergies, medications, health status, including review of consultants reports, laboratory and other test data, was performed as part of comprehensive evaluation and provision of chronic care management services.   SDOH:  (Social Determinants of Health) assessments and interventions performed: Yes   CCM Care Plan  Allergies  Allergen Reactions   Antibacterial  Hand Soap [Triclosan] Other (See Comments)    Caused water blisters    Medications Reviewed Today     Reviewed by Madelin Rear, [facility] (Pharmacist) on 07/24/20 at [id number]  Med List Status: <None>   Medication Order Taking? Sig Documenting Provider Last Dose Status Informant  amLODipine (NORVASC) 10 MG tablet [id number] Yes Take 1 tablet (10 mg total) by mouth daily. Alethia Berthold, PA-C  Active   ELIQUIS 5 MG TABS tablet [id number] Yes TAKE ONE TABLET BY MOUTH TWICE DAILY Cantwell, Celeste C, PA-C  Active   escitalopram (LEXAPRO) 10 MG tablet [id number] Yes Take 1 tablet (10 mg total) by mouth daily. Brunetta Jeans, PA-C  Active   hydrALAZINE (APRESOLINE) 50 MG tablet [id number] Yes Take 1 tablet (50 mg total) by mouth 3 (three) times daily. Cantwell, Celeste C, PA-C  Active   hydrochlorothiazide (HYDRODIURIL) 25 MG tablet [id number] Yes Take 1 tablet by mouth every morning. [provider]  Active   metFORMIN (GLUCOPHAGE) 500 MG tablet [id number] Yes TAKE ONE TABLET BY MOUTH EVERY MORNING and TAKE TWO TABLETS BY MOUTH EVERY Linus Mako, NP  Active     Discontinued 02/16/20 0959 (Side effect (s))            Med Note>> Lawerance Cruel C, PA-C   02/16/2020  9:59 AM bradycardia    nicotine polacrilex (COMMIT) 4 MG lozenge [id number] Yes Take 4 mg by mouth as needed for smoking cessation. [provider]  Active Spouse/Significant Other  rosuvastatin (CRESTOR) 10 MG tablet [id number] Yes TAKE ONE TABLET BY MOUTH ONCE DAILY Cantwell, Celeste C, PA-C  Active   telmisartan (MICARDIS) 80 MG tablet [id number] Yes Take 1 tablet (80 mg total) by mouth daily. Alethia Berthold, PA-C  Active             Patient Active Problem List   Diagnosis Date Noted   A-fib [facility]) 12/03/2019   Pulmonary emphysema ([place]) 09/22/2018   Encounter for screening for malignant neoplasm of respiratory organs 02/07/2018   Screening for AAA (abdominal aortic aneurysm) 02/07/2018   Skin cancer  screening 02/07/2018   Need for 23-polyvalent pneumococcal polysaccharide vaccine 02/07/2018   Encounter for immunization 02/07/2018   Visit for preventive health examination 02/07/2018   Hx of colonic polyps 02/23/2017   Hyperlipidemia associated with type 2 diabetes mellitus ([place]) 10/02/2016   Diabetes mellitus type II, uncontrolled ([place]) 09/18/2016   Encounter for Medicare annual wellness exam 09/16/2016   Special screening examination for viral disease 09/16/2016   Annual physical exam 09/16/2016   Prostate cancer screening 09/16/2016   Tobacco abuse disorder 08/11/2016   Essential hypertension 08/11/2016    Immunization History  Administered Date(s) Administered  Fluad Quad(high Dose 65+) 10/19/2018   Influenza, High Dose Seasonal PF 10/30/2016, 02/04/2018   PFIZER(Purple Top)SARS-COV-2 Vaccination 03/26/2019, 04/26/2019   Pneumococcal Conjugate-13 09/10/2016   Pneumococcal Polysaccharide-23 02/04/2018    Conditions to be addressed/monitored: Afib, DMII, HLD, HTN, tobacco use   Care Plan : [place]  Updates made by Madelin Rear, [facility] since 07/24/2020 12:00 AM     Problem: Atrial Fibrillation, HTN, HLD, DMII and Depression   Priority: High  Onset Date: 04/11/2020     Long-Range Goal: Disease Management   Start Date: 04/11/2020  Expected End Date: 10/08/2020  This Visit's Progress: On track  Recent Progress: On track  Priority: High  Note:    Current Barriers:  Unable to maintain control of HTN - trending to goal  Pharmacist Clinical Goal(s):  Patient will verbalize ability to afford treatment regimen contact provider office for questions/concerns as evidenced notation of same in electronic health record through collaboration with PharmD and provider.   Interventions: 1:1 collaboration with Maximiano Coss, NP regarding development and update of comprehensive plan of care as evidenced by provider attestation and co-signature Inter-disciplinary care  team collaboration (see longitudinal plan of care) Comprehensive medication review performed; medication list updated in electronic medical record  Hypertension (BP goal <140/90) -Controlled -Patient has working with cardiology on BP management, has made signicant improvement with recent med management. Wife mentions higher readings on home cuff so they have held off on testing until their device is calibrated at next visit . Denies any complaints including dizziness or excessive fatigue.  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 -Denies hypotensive/hypertensive symptoms -Educated on BP goals and benefits of medications for prevention of heart attack, stroke and kidney damage; Symptoms of hypotension and importance of maintaining adequate hydration; -Counseled on diet and exercise extensively Recommended to continue current medication  Diabetes (A1c goal <7%) -Controlled -A1c increased to 6.6% 06/04/20 from 6.2% 12/03/2019 -Current medications: Metformin 500 mg - 1 tab in the morning, 2 tabs in evening  -Current home glucose readings fasting glucose: not testing -Denies hypoglycemic/hyperglycemic symptoms -Reviewed for side effects - no GI related complaints -Educated on A1c and blood sugar goals; -Counseled to check feet daily and get yearly eye exams -Recommended to continue current medication  Patient Goals/Self-Care Activities Patient will:  - take medications as prescribed  Follow Up Plan: Rph f/u call 6 months     Medication Assistance: Utilizing [facility].  Patient's preferred pharmacy is: [facility] -  [place], [place] - [address] [place] [address] Phone: [phone] Fax: [phone]  Uses pill box? Yes Pt endorses 100% compliance  Follow Up:  Patient agrees to Care Plan and Follow-up.  Plan:  Rph f/u 6 months  Future Appointments  Date Time Provider [place]  08/01/2020  9:30 AM PCV-ECHO/VAS 1 PCV-IMG None  08/23/2020  8:30 AM Noralyn Pick, NP [facility]  12/05/2020 10:30 AM Maximiano Coss, NP [facility]  01/02/2021  9:30 AM Cantwell, Gerline Legacy, PA-C PCV-PCV None   Madelin Rear, PharmD, CPP Clinical Pharmacist Practitioner  [facility]  [phone]

## 2020-07-24 NOTE — Patient Instructions (Addendum)
Mr. Peaster,  Thank you for talking with me today. I have included our care plan/goals in the following pages.   Please review and call me at [phone] with any questions.  Thanks! Ellin Mayhew, PharmD, CPP Clinical Pharmacist Practitioner  [facility]  [phone]    Patient Care Plan: CCM Pharmacy Care Plan     Problem Identified: Atrial Fibrillation, HTN, HLD, DMII and Depression   Priority: High  Onset Date: 04/11/2020     Long-Range Goal: Disease Management   Start Date: 04/11/2020  Expected End Date: 10/08/2020  This Visit's Progress: On track  Recent Progress: On track  Priority: High  Note:    Current Barriers:  Unable to maintain control of HTN - trending to goal  Pharmacist Clinical Goal(s):  Patient will verbalize ability to afford treatment regimen contact provider office for questions/concerns as evidenced notation of same in electronic health record through collaboration with PharmD and provider.   Interventions: 1:1 collaboration with Maximiano Coss, NP regarding development and update of comprehensive plan of care as evidenced by provider attestation and co-signature Inter-disciplinary care team collaboration (see longitudinal plan of care) Comprehensive medication review performed; medication list updated in electronic medical record  Hypertension (BP goal <140/90) -Controlled -Patient has working with cardiology on BP management, has made signicant improvement with recent med management. Wife mentions higher readings on home cuff so they have held off on testing until their device is calibrated at next visit . Denies any complaints including dizziness or excessive fatigue.  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 -Denies hypotensive/hypertensive symptoms -Educated on BP goals and benefits of medications for prevention of heart attack, stroke and kidney damage; Symptoms of hypotension and importance of maintaining adequate hydration; -Counseled on diet and exercise extensively Recommended to continue current medication  Diabetes (A1c goal <7%) -Controlled -A1c increased to 6.6% 06/04/20 from 6.2% 12/03/2019 -Current medications: Metformin 500 mg - 1 tab in the morning, 2 tabs in evening  -Current home glucose readings fasting glucose: not testing -Denies hypoglycemic/hyperglycemic symptoms -Reviewed for side effects - no GI related complaints -Educated on A1c and blood sugar goals; -Counseled to check feet daily and get yearly eye exams -Recommended to continue current medication  Patient Goals/Self-Care Activities Patient will:  - take medications as prescribed  Follow Up Plan: Rph f/u call 6 months     The patient verbalized understanding of instructions provided today and agreed to receive a MyChart copy of patient instruction and/or educational materials. Telephone follow up appointment with pharmacy team member scheduled for: See next appointment with "Care Management Staff" under "What's Next" below.

## 2020-07-26 ENCOUNTER — Telehealth: Payer: Self-pay

## 2020-07-26 NOTE — Chronic Care Management (AMB) (Signed)
    Chronic Care Management Pharmacy Assistant   Name: John Woodward.  MRN: [id number] DOB: 1945-04-04  Reason for Encounter: Medication Coordination Call  Recent office visits:  No visits noted  Recent consult visits:  No visits noted  Hospital visits:  None in previous 6 months  Medications: Outpatient Encounter Medications as of 07/26/2020  Medication Sig Note   amLODipine (NORVASC) 10 MG tablet Take 1 tablet (10 mg total) by mouth daily.    ELIQUIS 5 MG TABS tablet TAKE ONE TABLET BY MOUTH TWICE DAILY    escitalopram (LEXAPRO) 10 MG tablet Take 1 tablet (10 mg total) by mouth daily.    hydrALAZINE (APRESOLINE) 50 MG tablet Take 1 tablet (50 mg total) by mouth 3 (three) times daily.    hydrochlorothiazide (HYDRODIURIL) 25 MG tablet Take 1 tablet by mouth every morning.    metFORMIN (GLUCOPHAGE) 500 MG tablet TAKE ONE TABLET BY MOUTH EVERY MORNING and TAKE TWO TABLETS BY MOUTH EVERY EVENING    nicotine polacrilex (COMMIT) 4 MG lozenge Take 4 mg by mouth as needed for smoking cessation.    rosuvastatin (CRESTOR) 10 MG tablet TAKE ONE TABLET BY MOUTH ONCE DAILY    telmisartan (MICARDIS) 80 MG tablet Take 1 tablet (80 mg total) by mouth daily.    [DISCONTINUED] metoprolol tartrate (LOPRESSOR) 25 MG tablet Take 1 tablet (25 mg total) by mouth 2 (two) times daily. 02/16/2020: bradycardia   No facility-administered encounter medications on file as of 07/26/2020.    Reviewed chart for medication changes ahead of medication coordination call.  No OVs, Consults, or hospital visits since last care coordination call/Pharmacist visit. (If appropriate, list visit date, provider name)  No medication changes indicated OR if recent visit, treatment plan here.  BP Readings from Last 3 Encounters:  07/02/20 (!) 130/59  06/04/20 (!) 142/58  05/16/20 (!) 168/85    Lab Results  Component Value Date   HGBA1C 6.6 (H) 06/04/2020     Patient obtains medications through Vials  90 Days    Last adherence delivery included:  Metformin 500 mg   Patient is due for next adherence delivery on: 08/08/20 Called patient and reviewed medications and coordinated delivery.  This delivery to include: Telmisartan 80 mg once daily Amlodipine 10 mg once daily Escitalopram 10 mg once daily Eliquis 5mg - take one tab twice daily Rosuvastatin 10mg - take one tab daily Metformin 500 mg- one tab every morning and 2 tabs every evening  Hydrochlorothiazide 25 mg - once daily  Hydralazine 50 mg - one tab three times daily   Patient needs refills for:  Telmisartan 80 mg  Amlodipine 10 mg Hydrochlorothiazide 25 mg   Confirmed delivery date of 08/08/20, advised patient that pharmacy will contact them the morning of delivery.  Wilford Sports CPA, CMA

## 2020-07-28 ENCOUNTER — Other Ambulatory Visit: Payer: Self-pay | Admitting: Student

## 2020-08-01 ENCOUNTER — Ambulatory Visit: Payer: Medicare HMO | Admitting: Student

## 2020-08-01 ENCOUNTER — Other Ambulatory Visit: Payer: Self-pay

## 2020-08-01 ENCOUNTER — Ambulatory Visit: Payer: Medicare HMO

## 2020-08-01 ENCOUNTER — Encounter: Payer: Self-pay | Admitting: Student

## 2020-08-01 VITALS — BP 146/68 | HR 57 | Temp 97.7°F | Resp 17 | Ht 71.0 in | Wt 152.0 lb

## 2020-08-01 DIAGNOSIS — R42 Dizziness and giddiness: Secondary | ICD-10-CM

## 2020-08-01 DIAGNOSIS — I10 Essential (primary) hypertension: Secondary | ICD-10-CM

## 2020-08-01 DIAGNOSIS — I480 Paroxysmal atrial fibrillation: Secondary | ICD-10-CM

## 2020-08-01 DIAGNOSIS — I6523 Occlusion and stenosis of bilateral carotid arteries: Secondary | ICD-10-CM

## 2020-08-01 DIAGNOSIS — R0989 Other specified symptoms and signs involving the circulatory and respiratory systems: Secondary | ICD-10-CM

## 2020-08-01 NOTE — Progress Notes (Signed)
Primary Physician/Referring:  Maximiano Coss, NP  Patient ID: John Heman., male    DOB: 03/29/1945, 75 y.o.   MRN: [id number]  Chief Complaint  Patient presents with   Dizziness   HPI:    John Reihl.  is a 75 y.o. with history of COPD, hypertension, hyperlipidemia, type II diabetes mellitus non-insulin dependent. He presented to emergency department 12/03/2019 following a syncopal episode at home. Upon admission patient went into atrial fibrillation with RVR and was found to be positive for Covid 19. Patient was started on Eliquis for anticoagulation.    Patient was in the office today for carotid artery duplex and notified the ultrasound he had not 2 episodes of dizziness in the last 3 days.  When the test was completed and he stood up off the table he had another brief episode of dizziness.  Patient states over the last 3 days.  Patient states his episodes have primarily occurred when he is going from sitting or laying to standing, episodes last several seconds and he has had no syncope.  Denies chest pain, dyspnea, syncope, near syncope.  Denies orthopnea, PND, leg swelling.  Patient admits to poor fluid intake, stating he drinks little water throughout the day as well as drinks soda.  Past Medical History:  Diagnosis Date   Arthritis    Closed fracture dislocation of right elbow    30 years ago -- fell out of theback of a truck   Diabetes mellitus without complication ([place])    Hx of colonic polyps 02/23/2017   Hyperlipidemia    Hypertension    Diagnosed 10 years ago. No on medication. Denies history of CAD.    Memory loss    Past Surgical History:  Procedure Laterality Date   OLECRANON BURSECTOMY Right 08/18/2016   Procedure: OLECRANON BURSECTOMY;  Surgeon: Meredith Pel, MD;  Location: [facility];  Service: Orthopedics;  Laterality: Right;   Family History  Problem Relation Age of Onset   Hypertension Mother    Dementia Mother        48s   Transient  ischemic attack Mother        41s   Kidney disease Father    COPD Father    Hypertension Brother    Alcohol abuse Maternal Grandfather    Mental illness Paternal Grandmother    Hypertension Daughter    Healthy Son    Learning disabilities Son    Lung cancer Maternal Aunt    Colon cancer Neg Hx    Colon polyps Neg Hx    Esophageal cancer Neg Hx    Rectal cancer Neg Hx    Stomach cancer Neg Hx     Social History   Tobacco Use   Smoking status: Every Day    Packs/day: 0.25    Years: 50.00    Pack years: 12.50    Types: Cigarettes    Start date: 08/12/1966   Smokeless tobacco: Never   Tobacco comments:    Taking Nicotine lozenges  Substance Use Topics   Alcohol use: No   Marital Status: Married   ROS  Review of Systems  Cardiovascular:  Negative for chest pain, claudication, leg swelling, near-syncope, orthopnea, palpitations, paroxysmal nocturnal dyspnea and syncope.  Respiratory:  Negative for shortness of breath.   Hematologic/Lymphatic: Does not bruise/bleed easily.  Gastrointestinal:  Negative for melena.  Neurological:  Positive for dizziness.   Objective  Blood pressure (!) 146/68, pulse (!) 57, temperature 97.7 F (36.5 C), temperature  source Temporal, resp. rate 17, height 5\' 11"  (1.803 m), weight 152 lb (68.9 kg), SpO2 98 %.  Vitals with BMI 08/01/2020 07/02/2020 06/04/2020  Height 5\' 11"  5\' 11"  5\' 11"   Weight 152 lbs 154 lbs 154 lbs 10 oz  BMI 21.21 29.47 65.46  Systolic 503 546 568  Diastolic 68 59 58  Pulse 57 75 66    Orthostatic VS for the past 72 hrs (Last 3 readings):  Orthostatic BP Patient Position BP Location Cuff Size Orthostatic Pulse  08/01/20 1010 112/64 Standing Left Arm Large 63  08/01/20 1009 132/61 Sitting Left Arm Normal 59  08/01/20 1008 125/61 Supine Left Arm Large 53     Physical Exam Vitals reviewed.  Constitutional:      Appearance: Normal appearance.  Cardiovascular:     Rate and Rhythm: Normal rate and regular rhythm.      Pulses: No decreased pulses.          Carotid pulses are 2+ on the right side with bruit and 2+ on the left side.      Radial pulses are 2+ on the right side and 2+ on the left side.       Femoral pulses are 1+ on the right side and 1+ on the left side.      Popliteal pulses are 1+ on the right side and 1+ on the left side.       Dorsalis pedis pulses are 0 on the right side and 0 on the left side.       Posterior tibial pulses are 0 on the right side and 0 on the left side.     Heart sounds: S1 normal and S2 normal. No murmur heard.   No gallop.     Comments: Venous stasis skin changes bilateral lower legs and feet, left more than right. No JVD, no leg edema.   Left foot cool to touch compared to right foot.  Pulmonary:     Effort: Pulmonary effort is normal. No respiratory distress.     Breath sounds: No wheezing, rhonchi or rales.  Musculoskeletal:     Right lower leg: No edema.     Left lower leg: No edema.  Neurological:     Mental Status: He is alert.   Laboratory examination:   Recent Labs    12/03/19 1228 12/04/19 0329 12/05/19 0127 12/14/19 1054 02/02/20 1109 02/29/20 0941 06/04/20 1112  NA 138 137 137   < > 143 138 138  K 4.4 3.5 5.1   < > 5.4* 4.0 4.3  CL 104 104 102   < > 106 101 101  CO2 24 22 24    < > 31 33* 24  GLUCOSE 149* 129* 105*   < > 105* 137* 116*  BUN 32* 31* 31*   < > 22 28* 35*  CREATININE 1.83* 1.47* 1.61*   < > 1.16 1.19 1.31  CALCIUM 8.3* 8.3* 8.5*   < > 9.7 9.7 9.7  GFRNONAA 38* 50* 45*  --   --   --   --    < > = values in this interval not displayed.   CrCl cannot be calculated (Patient's most recent lab result is older than the maximum 21 days allowed.).  CMP Latest Ref Rng & Units 06/04/2020 02/29/2020 02/02/2020  Glucose 70 - 99 mg/dL 116(H) 137(H) 105(H)  BUN 6 - 23 mg/dL 35(H) 28(H) 22  Creatinine 0.40 - 1.50 mg/dL 1.31 1.19 1.16  Sodium 135 - 145 mEq/L 138  138 143  Potassium 3.5 - 5.1 mEq/L 4.3 4.0 5.4(H)  Chloride 96 - 112 mEq/L 101  101 106  CO2 19 - 32 mEq/L 24 33(H) 31  Calcium 8.4 - 10.5 mg/dL 9.7 9.7 9.7  Total Protein 6.0 - 8.3 g/dL 7.7 - -  Total Bilirubin 0.2 - 1.2 mg/dL 0.5 - -  Alkaline Phos 39 - 117 U/L 68 - -  AST 0 - 37 U/L 18 - -  ALT 0 - 53 U/L 15 - -   CBC Latest Ref Rng & Units 06/04/2020 12/14/2019 12/05/2019  WBC 4.0 - 10.5 K/uL 6.2 7.7 3.5(L)  Hemoglobin 13.0 - 17.0 g/dL 15.5 14.1 16.3  Hematocrit 39.0 - 52.0 % 45.6 41.5 48.9  Platelets 150.0 - 400.0 K/uL 249.0 399.0 137(L)    Lipid Panel Recent Labs    08/24/19 0840 12/04/19 0329 06/04/20 1112  CHOL 216* 158 152  TRIG 212.0* 129 230.0*  LDLCALC  --  101*  --   VLDL 42.4* 26 46.0*  HDL 37.70* 31* 42.80  CHOLHDL 6 5.1 4  LDLDIRECT 139.0  --  78.0    HEMOGLOBIN A1C Lab Results  Component Value Date   HGBA1C 6.6 (H) 06/04/2020   MPG 131.24 12/03/2019   TSH Recent Labs    12/03/19 1228 12/04/19 0329 06/04/20 1112  TSH 2.605 0.973 1.63    External labs:  None  Allergies   Allergies  Allergen Reactions   Antibacterial Hand Soap [Triclosan] Other (See Comments)    Caused water blisters      Medications Prior to Visit:   Outpatient Medications Prior to Visit  Medication Sig Dispense Refill   amLODipine (NORVASC) 10 MG tablet TAKE ONE TABLET BY MOUTH ONCE DAILY 90 tablet 1   ELIQUIS 5 MG TABS tablet TAKE ONE TABLET BY MOUTH TWICE DAILY 180 tablet 1   escitalopram (LEXAPRO) 10 MG tablet Take 1 tablet (10 mg total) by mouth daily. 90 tablet 2   hydrALAZINE (APRESOLINE) 50 MG tablet TAKE ONE TABLET BY MOUTH THREE TIMES DAILY 90 tablet 3   hydrochlorothiazide (HYDRODIURIL) 25 MG tablet Take 1 tablet by mouth every morning.     metFORMIN (GLUCOPHAGE) 500 MG tablet TAKE ONE TABLET BY MOUTH EVERY MORNING and TAKE TWO TABLETS BY MOUTH EVERY EVENING 90 tablet 2   nicotine polacrilex (COMMIT) 4 MG lozenge Take 4 mg by mouth as needed for smoking cessation.     rosuvastatin (CRESTOR) 10 MG tablet TAKE ONE TABLET BY MOUTH ONCE  DAILY 30 tablet 3   telmisartan (MICARDIS) 80 MG tablet TAKE ONE TABLET BY MOUTH ONCE DAILY 30 tablet 1   No facility-administered medications prior to visit.     Final Medications at End of Visit    Current Meds  Medication Sig   amLODipine (NORVASC) 10 MG tablet TAKE ONE TABLET BY MOUTH ONCE DAILY   ELIQUIS 5 MG TABS tablet TAKE ONE TABLET BY MOUTH TWICE DAILY   escitalopram (LEXAPRO) 10 MG tablet Take 1 tablet (10 mg total) by mouth daily.   hydrALAZINE (APRESOLINE) 50 MG tablet TAKE ONE TABLET BY MOUTH THREE TIMES DAILY   hydrochlorothiazide (HYDRODIURIL) 25 MG tablet Take 1 tablet by mouth every morning.   metFORMIN (GLUCOPHAGE) 500 MG tablet TAKE ONE TABLET BY MOUTH EVERY MORNING and TAKE TWO TABLETS BY MOUTH EVERY EVENING   nicotine polacrilex (COMMIT) 4 MG lozenge Take 4 mg by mouth as needed for smoking cessation.   rosuvastatin (CRESTOR) 10 MG tablet TAKE ONE TABLET BY  MOUTH ONCE DAILY   telmisartan (MICARDIS) 80 MG tablet TAKE ONE TABLET BY MOUTH ONCE DAILY      Radiology:   No results found.  Cardiac Studies:   ABI 12/29/2019:  This exam reveals moderately decreased perfusion of the right lower extremity, noted at the post tibial artery level (ABI 0.51) and severely decreased perfusion of the left lower extremity, noted at the dorsalis pedis and post tibial artery level (ABI 0.32).  Right AT appears to be occluded with dampened waveform. There is severely abnormal monophasic waveform of all vessels at the level of the ankle.  PCV MYOCARDIAL PERFUSION WO LEXISCAN 12/27/2019 Nondiagnostic ECG stress due to pharmacologic stress. Resting EKG demonstrated normal sinus rhythm. Non-specific T-wave abnormalities. Peak EKG revealed no significant ST-T changes. Myocardial perfusion is normal. Overall LV systolic function is normal without regional wall motion abnormalities. Stress LV EF: 58%. No previous exam available for comparison. Low risk.  Echocardiogram 12/04/2019:   1. Patient in afib during exam.   2. Left ventricular ejection fraction, by estimation, is 60 to 65%. The left ventricle has normal function. The left ventricle has no regional wall motion abnormalities. Left ventricular diastolic parameters are indeterminate.   3. Right ventricular systolic function is normal. The right ventricular size is normal.   4. Left atrial size was mildly dilated.   5. The mitral valve is degenerative. Trivial mitral valve regurgitation. No evidence of mitral stenosis.   6. The aortic valve was not well visualized. Aortic valve regurgitation is not visualized. Mild to moderate aortic valve sclerosis/calcification is present, without any evidence of aortic stenosis.   7. The inferior vena cava is normal in size with greater than 50% respiratory variability, suggesting right atrial pressure of 3 mmHg.  EKG:   02/16/2020: Marked sinus bradycardia at a rate of 49 bpm, left atrial enlargement.  Normal axis.  Poor R wave progression, cannot exclude anteroseptal infarct old.  Nonspecific T wave abnormality.  02/01/2020: Marked sinus bradycardia rate of 47 bpm, left atrial enlargement.  Normal axis.  Poor R wave progression, cannot exclude anteroseptal infarct old.  12/25/2019: Sinus bradycardia at rate of 48 bpm. Normal axis. Left atrial enlargement. PRWP, cannot exclude anterolateral infarct old. Non-specific T wave abnormality.   12/04/2019: Atrial fibrillation with rapid ventricular response at a rate of 107 bpm. Normal axis, left atrial. Enlargement.   Assessment     ICD-10-CM   1. Dizziness  R42     2. Essential hypertension  I10     3. Paroxysmal atrial fibrillation (HCC)  I48.0         There are no discontinued medications.  No orders of the defined types were placed in this encounter.  This patients CHA2DS2-VASc Score 4 (HTN, DM, Age, PAD) and yearly risk of stroke 4.8%.  Recommendations:   John Lerette. is a 75 y.o. with history of hypertension,  hyperlipidemia, type II diabetes mellitus. Also with history of covid 19 infection 11/2019, and diagnosed with atrial fibrillation 12/03/2019.   Patient presents for urgent visit with concerns of 3 episodes of brief dizziness.  Blood pressure is well controlled.  Patient is borderline orthostatic in the office today.  Have advised him to increase fluid intake and continue to monitor symptoms closely.  Suspect patient's episodes of orthostatic dizziness are related to dehydration given poor fluid intake.  Patient verbalized understanding agreement.  Counseled him regarding signs symptoms that would warrant urgent or emergent evaluation.  Regard to paroxysmal atrial fibrillation, patient is tolerating  anticoagulation without bleeding diathesis, will continue this given CHA2DS2-VASc score 4.  Follow-up as previously scheduled in December, sooner if needed.   John Berthold, PA-C 08/01/2020, 11:11 AM Office: [phone]

## 2020-08-23 ENCOUNTER — Encounter: Payer: Self-pay | Admitting: Nurse Practitioner

## 2020-08-23 ENCOUNTER — Encounter: Payer: Self-pay | Admitting: Student

## 2020-08-23 ENCOUNTER — Telehealth: Payer: Self-pay

## 2020-08-23 ENCOUNTER — Ambulatory Visit (INDEPENDENT_AMBULATORY_CARE_PROVIDER_SITE_OTHER): Payer: Medicare HMO | Admitting: Nurse Practitioner

## 2020-08-23 VITALS — BP 112/62 | HR 69 | Ht 71.0 in | Wt 151.0 lb

## 2020-08-23 DIAGNOSIS — I4891 Unspecified atrial fibrillation: Secondary | ICD-10-CM | POA: Diagnosis not present

## 2020-08-23 DIAGNOSIS — R197 Diarrhea, unspecified: Secondary | ICD-10-CM | POA: Diagnosis not present

## 2020-08-23 DIAGNOSIS — Z86010 Personal history of colonic polyps: Secondary | ICD-10-CM

## 2020-08-23 DIAGNOSIS — Z7901 Long term (current) use of anticoagulants: Secondary | ICD-10-CM

## 2020-08-23 NOTE — Telephone Encounter (Signed)
Called pt and informed about Celeste Calwell's response below. Verbalized acceptance and understanding.

## 2020-08-23 NOTE — Patient Instructions (Addendum)
It was my pleasure to provide care to you today. Based on our discussion, I am providing you with my recommendations below:  RECOMMENDATION(S):   Please call Carl Best, NP if your carotid doppler study is abnormal Stop drinking regular milk at bedtime as this may be contributing to your loose stools. May try substituting regular milk with Soy or Lactose Free dairy products Please follow up with your PCP to inquire about reducing your Metformin dosage as this may be contributing to your loose stools  COLONOSCOPY:   You have been scheduled for a colonoscopy. Please follow written instructions given to you at your visit today.   PREP:   Please pick up your prep supplies at the pharmacy within the next 1-3 days.  INHALERS:   If you use inhalers (even only as needed), please bring them with you on the day of your procedure.  MEDICATIONS TO HOLD:  We will contact your provider to request permission for you to hold Eliquis. Once we receive a response, you will be contacted by our office. If you do not hear from our office 1 week prior to your scheduled procedure, please contact our office at [phone].  Please refer to your prep instructions regarding holding Metformin.  COLONOSCOPY TIPS:  To reduce nausea and dehydration, stay well hydrated for 3-4 days prior to the exam.  To prevent skin/hemorrhoid irritation - prior to wiping, put A&Dointment or vaseline on the toilet paper. Keep a towel or pad on the bed.  BEFORE STARTING YOUR PREP, drink  64oz of clear liquids in the morning. This will help to flush the colon and will ensure you are well hydrated!!!!  NOTE - This is in addition to the fluids required for to complete your prep. Use of a flavored hard candy, such as grape Anise Salvo, can counteract some of the flavor of the prep and may prevent some nausea.   FOLLOW UP:  After your procedure, you will receive a call from my office staff regarding my recommendation  for follow up.  BMI:  If you are age 47 or older, your body mass index should be between 23-30. Your There is no height or weight on file to calculate BMI. If this is out of the aforementioned range listed, please consider follow up with your Primary Care Provider.  MY CHART:  The [facility] providers would like to encourage you to use [facility] to communicate with providers for non-urgent requests or questions.  Due to long hold times on the telephone, sending your provider a message by [facility] may be a faster and more efficient way to get a response.  Please allow 48 business hours for a response.  Please remember that this is for non-urgent requests.   Thank you for trusting me with your gastrointestinal care!    Carl Best, NP

## 2020-08-23 NOTE — Progress Notes (Signed)
08/23/2020 John Woodward [id number] [birth year]   CHIEF COMPLAINT: Schedule a colonoscopy   HISTORY OF PRESENT ILLNESS:  John Woodward. John Woodward is a 75 year old male with a past medical history of arthritis, hypertension, hyperlipidemia, atrial fibrillation on Eliquis, Stress LV-EF 58%, PAD, COPD, DM II, mild cognitive impairment,  past brain MRI showed severe chronic small vessel ischemic disease with chronic lacunar infarcts and multiple chronic microhemorrhages likely due to chronic hypertension and colon polyps. Covid 19 test positive 11/2019.   He presents to our office today as referred by Maximiano Coss NP to schedule a colonoscopy due to having a history of colon polyps.  He is accompanied by his wife.  He underwent a colonoscopy by Dr. Carlean Purl on 02/17/2017 which identified two polys removed from the sigmoid and descending colon, 3 polyps removed from the rectum, sigmoid and transverse colon and 2 polyps removed from the transverse and ascending colon.  Scattered diverticula were identified to the right colon.  Pathology report was consistent with tubular adenomatous and hyperplastic polyps.  He was advised to repeat a colonoscopy in 3 years.  He is passing a loose brown mud-like stool daily for the past few months.  Previously passed a solid BM every morning.  No upper or lower abdominal pain.  No rectal bleeding or black-colored stools.  No recent antibiotics.  He has been on Metformin 500 mg in the morning and 1000 mg in the evening for several years without recent change in dosage.  Rinks a glass of milk at bedtime every night with no prior history of lactose intolerance.  He denies having any dysphagia or heartburn.  He infrequently takes Advil for occult pain.  He is a chronic smoker and reported reducing his smoking intake to 5 to 6 cigarettes daily.  He had a syncopal episode and he was admitted to the hospital 11/2019 found to be in atrial fibrillation with RVR. He tested positive  for Covid during this admission. Stress LV-EF 58%. He was discharged home on Lopressor and Eliquis. He was last seen by cardiology PA-C Lawerance Cruel on  08/01/2020, he had been experiencing some dizziness. He was borderline orthostatic in their office and his dizziness was thought to be due to dehydration. He was advised to increase his fluid intake and a carotid duplex study was scheduled for next week.  No history of coronary artery disease.  No chest pain, palpitations or shortness of breath.  Colonoscopy 02/17/2017: - Two 7 to 8 mm polyps in the sigmoid colon and in the descending colon, removed with a hot snare. Resected and retrieved. - Three 4 to 6 mm polyps in the rectum, in the sigmoid colon and in the transverse colon, removed with a cold snare. Resected and retrieved. - Two 2 mm polyps in the transverse colon and in the ascending colon, removed with a cold biopsy forceps. Resected and retrieved. association with the diverticular opening. - Scattered diverticula were found in the right colon. - The exam was otherwise without abnormality on direct and retroflexion views. - 3 year recall colonoscopy  Biopsy Results: 1. Surgical [P], transverse, polyps (3) - HYPERPLASTIC POLYP(S). - THERE IS NO EVIDENCE OF MALIGNANCY. 2. Surgical [P], descending x 2, sigmoid, rectal - TUBULAR ADENOMA(S). - HIGH GRADE DYSPLASIA IS NOT IDENTIFIED.    CMP Latest Ref Rng & Units 06/04/2020 02/29/2020 02/02/2020  Glucose 70 - 99 mg/dL 116(H) 137(H) 105(H)  BUN 6 - 23 mg/dL 35(H) 28(H) 22  Creatinine 0.40 -  1.50 mg/dL 1.31 1.19 1.16  Sodium 135 - 145 mEq/L 138 138 143  Potassium 3.5 - 5.1 mEq/L 4.3 4.0 5.4(H)  Chloride 96 - 112 mEq/L 101 101 106  CO2 19 - 32 mEq/L 24 33(H) 31  Calcium 8.4 - 10.5 mg/dL 9.7 9.7 9.7  Total Protein 6.0 - 8.3 g/dL 7.7 - -  Total Bilirubin 0.2 - 1.2 mg/dL 0.5 - -  Alkaline Phos 39 - 117 U/L 68 - -  AST 0 - 37 U/L 18 - -  ALT 0 - 53 U/L 15 - -    CBC Latest Ref Rng & Units  06/04/2020 12/14/2019 12/05/2019  WBC 4.0 - 10.5 K/uL 6.2 7.7 3.5(L)  Hemoglobin 13.0 - 17.0 g/dL 15.5 14.1 16.3  Hematocrit 39.0 - 52.0 % 45.6 41.5 48.9  Platelets 150.0 - 400.0 K/uL 249.0 399.0 137(L)    Past Medical History:  Diagnosis Date   Arthritis    Closed fracture dislocation of right elbow    30 years ago -- fell out of theback of a truck   Diabetes mellitus without complication ([place])    Hx of colonic polyps 02/23/2017   Hyperlipidemia    Hypertension    Diagnosed 10 years ago. No on medication. Denies history of CAD.    Memory loss    Past Surgical History:  Procedure Laterality Date   OLECRANON BURSECTOMY Right 08/18/2016   Procedure: OLECRANON BURSECTOMY;  Surgeon: Meredith Pel, MD;  Location: [place];  Service: Orthopedics;  Laterality: Right;   Social History: He is married.  He is retired.  He has 2 sons and 1 daughter.  Smokes 5 to 6 cigarettes daily.  No alcohol use.  No drug use.  Family History: Father with history of kidney disease and COPD.  Mother with TIA and dementia.  Maternal aunt with history of lung cancer.  No known family history of esophageal, gastric or colon cancer.   Allergies  Allergen Reactions   Antibacterial Hand Soap [Triclosan] Other (See Comments)    Caused water blisters      Outpatient Encounter Medications as of 08/23/2020  Medication Sig   amLODipine (NORVASC) 10 MG tablet TAKE ONE TABLET BY MOUTH ONCE DAILY   ELIQUIS 5 MG TABS tablet TAKE ONE TABLET BY MOUTH TWICE DAILY   escitalopram (LEXAPRO) 10 MG tablet Take 1 tablet (10 mg total) by mouth daily.   hydrALAZINE (APRESOLINE) 50 MG tablet TAKE ONE TABLET BY MOUTH THREE TIMES DAILY   hydrochlorothiazide (HYDRODIURIL) 25 MG tablet Take 1 tablet by mouth every morning.   metFORMIN (GLUCOPHAGE) 500 MG tablet TAKE ONE TABLET BY MOUTH EVERY MORNING and TAKE TWO TABLETS BY MOUTH EVERY EVENING   nicotine polacrilex (COMMIT) 4 MG lozenge Take 4 mg by mouth as needed for smoking  cessation.   rosuvastatin (CRESTOR) 10 MG tablet TAKE ONE TABLET BY MOUTH ONCE DAILY   telmisartan (MICARDIS) 80 MG tablet TAKE ONE TABLET BY MOUTH ONCE DAILY   [DISCONTINUED] metoprolol tartrate (LOPRESSOR) 25 MG tablet Take 1 tablet (25 mg total) by mouth 2 (two) times daily.   No facility-administered encounter medications on file as of 08/23/2020.   REVIEW OF SYSTEMS:  Gen: Denies fever, sweats or chills. No weight loss.  CV: Denies chest pain, palpitations or edema. Resp: Denies cough, shortness of breath of hemoptysis.  GI: See HPI. GU : Denies urinary burning, blood in urine, increased urinary frequency or incontinence. MS: + Intermittent ankle pain.  Derm: Denies rash, itchiness, skin lesions or  unhealing ulcers. Psych: + Short term memory deficit.  Heme: Denies bruising, bleeding. Neuro:  Denies headaches, dizziness or paresthesias. Endo:  Denies any problems with DM, thyroid or adrenal function.  PHYSICAL EXAM: BP 112/62   Pulse 69   Ht '5\' 11"'$  (1.803 m)   Wt 151 lb (68.5 kg)   BMI 21.06 kg/m   General: 75 year old male in no acute distress. Head: Normocephalic and atraumatic. Eyes:  Sclerae non-icteric, conjunctive pink. Ears: Normal auditory acuity. Mouth: Dentition intact. No ulcers or lesions.  Neck: Supple, no lymphadenopathy or thyromegaly.  Lungs: Clear bilaterally to auscultation without wheezes, crackles or rhonchi. Heart: Regular rate and rhythm. No murmur, rub or gallop appreciated.  Abdomen: Soft, nontender, non distended. No masses. No hepatosplenomegaly. Normoactive bowel sounds x 4 quadrants.  Rectal: Deferred. Musculoskeletal: Symmetrical with no gross deformities. Skin: Warm and dry. No rash or lesions on visible extremities. Extremities: No edema. Neurological: Alert oriented x 4, no focal deficits.  Psychological:  Alert and cooperative. Normal mood and affect.  ASSESSMENT AND PLAN:  37. 75 year old male with a history of tubular adenomatous and  hyperplastic colon polyps per colonoscopy 01/2017. -Colonoscopy benefits and risks discussed including risk with sedation, risk of bleeding, perforation and infection  -Our office will contact cardiologist Dr. Einar Gip to verify Eliquis instructions prior to proceeding with a colonoscopy  2. Paroxysmal atrial fibrillation. CHA2DS2-VASc score 4. On Eliquis.  Regular rhythm on exam today.  3.  Loose mud like stools x 1 episode daily for the past few months. Unlikely infectious, possible lactose intolerance or secondary to Metformin. -Patient or his wife will call our office if his loose stools worsen, a GI pathogen panel and C. difficile stool studies will be ordered if his diarrhea worsens -Patient  advised to contact his PCP to consider reducing dose of Metformin which may be possibly is contributing to his loose stools -Stop drinking regular milk at bedtime, made try lactose-free or soy milk  4. DM II  5. COPD  6. Carotid artery disease. Carotid duplex study scheduled Tues 08/27/2020.  -His wife will contact our office if these results are abnormal or if any further carotid intervention is required.    CC:  Maximiano Coss, NP

## 2020-08-23 NOTE — Telephone Encounter (Signed)
Pre-operative Risk Assessment - HOLD ELIQUIS    John Woodward October 21, 1945 [id number]  Procedure: Colonoscopy Anesthesia type:  MAC Procedure Date: 09/06/20 Provider: Dr. Carlean Purl  Type of Clearance needed: Pharmacy  Medication(s) needing held: Eliquis   Length of time for medication to be held: 1-2 days  Please review request and advise by either responding to this message or by sending your response to the fax # provided below.  Thank you,  [facility]  Phone: [phone] Fax: [phone] ATTENTION: John Bordner, LPN

## 2020-08-23 NOTE — Telephone Encounter (Signed)
I have sent letter via Epic. Patient may hold Eliquis for 1-2 days prior to colonoscopy. Thank you.

## 2020-08-27 ENCOUNTER — Other Ambulatory Visit: Payer: Medicare HMO

## 2020-09-06 ENCOUNTER — Encounter: Payer: Medicare HMO | Admitting: Internal Medicine

## 2020-09-13 ENCOUNTER — Other Ambulatory Visit: Payer: Self-pay

## 2020-09-13 ENCOUNTER — Encounter: Payer: Self-pay | Admitting: Internal Medicine

## 2020-09-13 ENCOUNTER — Ambulatory Visit (AMBULATORY_SURGERY_CENTER): Payer: Medicare HMO | Admitting: Internal Medicine

## 2020-09-13 VITALS — BP 125/64 | HR 70 | Temp 98.6°F | Resp 12 | Ht 71.0 in | Wt 151.0 lb

## 2020-09-13 DIAGNOSIS — R197 Diarrhea, unspecified: Secondary | ICD-10-CM | POA: Diagnosis not present

## 2020-09-13 DIAGNOSIS — D120 Benign neoplasm of cecum: Secondary | ICD-10-CM | POA: Diagnosis not present

## 2020-09-13 DIAGNOSIS — I10 Essential (primary) hypertension: Secondary | ICD-10-CM | POA: Diagnosis not present

## 2020-09-13 DIAGNOSIS — E119 Type 2 diabetes mellitus without complications: Secondary | ICD-10-CM | POA: Diagnosis not present

## 2020-09-13 DIAGNOSIS — Z86010 Personal history of colonic polyps: Secondary | ICD-10-CM | POA: Diagnosis not present

## 2020-09-13 MED ORDER — SODIUM CHLORIDE 0.9 % IV SOLN: 500.0000 mL | INTRAVENOUS | Status: DC

## 2020-09-13 NOTE — Progress Notes (Signed)
1525 Ephedrine 10 mg given IV due to low BP, MD updated.  

## 2020-09-13 NOTE — Progress Notes (Signed)
History and Physical Interval Note:  09/13/2020 3:15 PM  John Woodward.  has presented today for endoscopic procedure(s), with the diagnosis of  Encounter Diagnoses  Name Primary?   Personal history of colonic polyps Yes   Diarrhea, unspecified type   .  The various methods of evaluation and treatment have been discussed with the patient and/or family. After consideration of risks, benefits and other options for treatment, the patient has consented to  the endoscopic procedure(s).   The patient's history has been reviewed, patient examined, no change in status, stable for surgery.  I have reviewed the patient's chart and labs.  Questions were answered to the patient's satisfaction.     Gatha Mayer, MD, [facility]

## 2020-09-13 NOTE — Progress Notes (Signed)
Called to room to assist during endoscopic procedure.  Patient ID and intended procedure confirmed with present staff. Received instructions for my participation in the procedure from the performing physician.  

## 2020-09-13 NOTE — Patient Instructions (Addendum)
One tiny polyp removed.  No cause of loose stools seen.  Try the following:  1) Use lactose-free milk  2) If that does not help then try using 1-3 teaspoons of psyllium powder every day  I appreciate the opportunity to care for you. Gatha Mayer, MD, [facility]  Resume Eliquis at prior dose tomorrow. Handout on polyps and diverticulosis given.   YOU HAD AN ENDOSCOPIC PROCEDURE TODAY AT [facility]:   Refer to the procedure report that was given to you for any specific questions about what was found during the examination.  If the procedure report does not answer your questions, please call your gastroenterologist to clarify.  If you requested that your care partner not be given the details of your procedure findings, then the procedure report has been included in a sealed envelope for you to review at your convenience later.  YOU SHOULD EXPECT: Some feelings of bloating in the abdomen. Passage of more gas than usual.  Walking can help get rid of the air that was put into your GI tract during the procedure and reduce the bloating. If you had a lower endoscopy (such as a colonoscopy or flexible sigmoidoscopy) you may notice spotting of blood in your stool or on the toilet paper. If you underwent a bowel prep for your procedure, you may not have a normal bowel movement for a few days.  Please Note:  You might notice some irritation and congestion in your nose or some drainage.  This is from the oxygen used during your procedure.  There is no need for concern and it should clear up in a day or so.  SYMPTOMS TO REPORT IMMEDIATELY:  Following lower endoscopy (colonoscopy or flexible sigmoidoscopy):  Excessive amounts of blood in the stool  Significant tenderness or worsening of abdominal pains  Swelling of the abdomen that is new, acute  Fever of 100F or higher   For urgent or emergent issues, a gastroenterologist can be reached at any hour by calling [phone]. Do not  use MyChart messaging for urgent concerns.    DIET:  We do recommend a small meal at first, but then you may proceed to your regular diet.  Drink plenty of fluids but you should avoid alcoholic beverages for 24 hours.  ACTIVITY:  You should plan to take it easy for the rest of today and you should NOT DRIVE or use heavy machinery until tomorrow (because of the sedation medicines used during the test).    FOLLOW UP: Our staff will call the number listed on your records 48-72 hours following your procedure to check on you and address any questions or concerns that you may have regarding the information given to you following your procedure. If we do not reach you, we will leave a message.  We will attempt to reach you two times.  During this call, we will ask if you have developed any symptoms of COVID 19. If you develop any symptoms (ie: fever, flu-like symptoms, shortness of breath, cough etc.) before then, please call [phone].  If you test positive for Covid 19 in the 2 weeks post procedure, please call and report this information to [place].    If any biopsies were taken you will be contacted by phone or by letter within the next 1-3 weeks.  Please call us at [phone] if you have not heard about the biopsies in 3 weeks.    SIGNATURES/CONFIDENTIALITY: You and/or your care partner have signed paperwork which  will be entered into your electronic medical record.  These signatures attest to the fact that that the information above on your After Visit Summary has been reviewed and is understood.  Full responsibility of the confidentiality of this discharge information lies with you and/or your care-partner.

## 2020-09-13 NOTE — Op Note (Signed)
[facility] Patient Name: John Woodward Procedure Date: 09/13/2020 3:13 PM MRN: [id number] Endoscopist: Gatha Mayer , MD Age: 75 Referring MD:  Date of Birth: 22-Jan-1946 Gender: Male Account #: [id number] Procedure:                Colonoscopy Indications:              Surveillance: Personal history of adenomatous                            polyps on last colonoscopy > 3 years ago Medicines:                Propofol per Anesthesia, Monitored Anesthesia Care Procedure:                Pre-Anesthesia Assessment:                           - Prior to the procedure, a History and Physical                            was performed, and patient medications and                            allergies were reviewed. The patient's tolerance of                            previous anesthesia was also reviewed. The risks                            and benefits of the procedure and the sedation                            options and risks were discussed with the patient.                            All questions were answered, and informed consent                            was obtained. Prior Anticoagulants: The patient                            last took Eliquis (apixaban) 2 days prior to the                            procedure. ASA Grade Assessment: II - A patient                            with mild systemic disease. After reviewing the                            risks and benefits, the patient was deemed in                            satisfactory condition to undergo the procedure.  After obtaining informed consent, the colonoscope                            was passed under direct vision. Throughout the                            procedure, the patient's blood pressure, pulse, and                            oxygen saturations were monitored continuously. The                            Olympus CF-HQ190L (Serial# [id number]) Colonoscope was                             introduced through the anus and advanced to the the                            terminal ileum, with identification of the                            appendiceal orifice and IC valve. The colonoscopy                            was performed without difficulty. The patient                            tolerated the procedure well. The quality of the                            bowel preparation was adequate. The terminal ileum,                            ileocecal valve, appendiceal orifice, and rectum                            were photographed. Scope In: 3:23:21 PM Scope Out: 3:34:43 PM Scope Withdrawal Time: 0 hours 8 minutes 41 seconds  Total Procedure Duration: 0 hours 11 minutes 22 seconds  Findings:                 The perianal and digital rectal examinations were                            normal.                           A diminutive polyp was found in the cecum. The                            polyp was sessile. The polyp was removed with a                            cold snare. Resection and retrieval were complete.  Verification of patient identification for the                            specimen was done. Estimated blood loss was minimal.                           Multiple diverticula were found in the sigmoid                            colon.                           External and internal hemorrhoids were found.                           The exam was otherwise without abnormality on                            direct and retroflexion views. Complications:            No immediate complications. Estimated Blood Loss:     Estimated blood loss was minimal. Impression:               - One diminutive polyp in the cecum, removed with a                            cold snare. Resected and retrieved.                           - Diverticulosis in the sigmoid colon.                           - External and internal hemorrhoids.                           - The  examination was otherwise normal on direct                            and retroflexion views.                           - Personal history of colonic polyps. Recommendation:           - Patient has a contact number available for                            emergencies. The signs and symptoms of potential                            delayed complications were discussed with the                            patient. Return to normal activities tomorrow.                            Written discharge instructions were provided to the  patient.                           - Lactose free diet.                           - Continue present medications.                           - Resume Eliquis (apixaban) at prior dose tomorrow.                           - Await pathology results.                           - No repeat colonoscopy. Gatha Mayer, MD 09/13/2020 3:42:24 PM This report has been signed electronically.

## 2020-09-13 NOTE — Progress Notes (Signed)
Report given to PACU, vss 

## 2020-09-16 ENCOUNTER — Telehealth: Payer: Self-pay

## 2020-09-16 NOTE — Progress Notes (Signed)
    Chronic Care Management Pharmacy Assistant   Name: John Woodward.  MRN: [id number] DOB: 02/26/1945   Reason for Encounter: Disease State - General Adherence Call    Recent office visits:  None noted.   Recent consult visits:  08/23/20 Carl Best, MD - Gastroenterology - History of Colonic polyps - Referral for Gastroenterology placed. Colonoscopy scheduled and prep instructions given. Follow up post colonscopy.  08/01/20 Lawerance Cruel, PA-C - Cardiology - Dizziness - No medication changes. Follow up as scheduled in December.  Hospital visits:  None in previous 6 months  Medications: Outpatient Encounter Medications as of 09/16/2020  Medication Sig Note   amLODipine (NORVASC) 10 MG tablet TAKE ONE TABLET BY MOUTH ONCE DAILY    ELIQUIS 5 MG TABS tablet TAKE ONE TABLET BY MOUTH TWICE DAILY    escitalopram (LEXAPRO) 10 MG tablet Take 1 tablet (10 mg total) by mouth daily.    hydrALAZINE (APRESOLINE) 50 MG tablet TAKE ONE TABLET BY MOUTH THREE TIMES DAILY    hydrochlorothiazide (HYDRODIURIL) 25 MG tablet Take 1 tablet by mouth every morning.    metFORMIN (GLUCOPHAGE) 500 MG tablet TAKE ONE TABLET BY MOUTH EVERY MORNING and TAKE TWO TABLETS BY MOUTH EVERY EVENING    nicotine polacrilex (COMMIT) 4 MG lozenge Take 4 mg by mouth as needed for smoking cessation.    rosuvastatin (CRESTOR) 10 MG tablet TAKE ONE TABLET BY MOUTH ONCE DAILY    telmisartan (MICARDIS) 80 MG tablet TAKE ONE TABLET BY MOUTH ONCE DAILY    [DISCONTINUED] metoprolol tartrate (LOPRESSOR) 25 MG tablet Take 1 tablet (25 mg total) by mouth 2 (two) times daily. 02/16/2020: bradycardia   No facility-administered encounter medications on file as of 09/16/2020.    Have you had any problems recently with your health? Patients wife denied any recent problems with patient's health at this time.   Have you had any problems with your pharmacy? Patient's wife denied any problems with current pharmacy.  What  issues or side effects are you having with your medications? Patient's wife denied any side effects or issues with his current medication.   What would you like me to pass along to Madelin Rear, CPP for them to help you with?  Patient's wife did not have anything to pass along to CPP at this time.   What can we do to take care of you better? Patient's wife did not have any suggestions. She reported she was satisfied with patient's level of care at this time.   Star Rating Drugs: Telmisartan (MICARDIS) 80 MG tablet - last filled 08/09/2020 60 days  Rosuvastatin (CRESTOR) 10 MG tablet - last filled 08/09/20 90 days  MetFORMIN (GLUCOPHAGE) 500 MG tablet - last filled 09/16/20 30 days    John Woodward, [place] Pharmacist Assistant  [phone]  Time Spent:  40 minutes

## 2020-09-17 ENCOUNTER — Telehealth: Payer: Self-pay | Admitting: *Deleted

## 2020-09-17 NOTE — Telephone Encounter (Signed)
Have you developed a fever since your procedure? no  2.   Have you had an respiratory symptoms (SOB or cough) since your procedure? no  3.   Have you tested positive for COVID 19 since your procedure no  4.   Have you had any family members/close contacts diagnosed with the COVID 19 since your procedure?  no   If yes to any of these questions please route to Joylene John, RN and Joella Prince, RN  Follow up Call-  Call back number 09/13/2020  Post procedure Call Back phone  # [phone]  Permission to leave phone message Yes  Some recent data might be hidden     Patient questions:  Do you have a fever, pain , or abdominal swelling? No. Pain Score  0 *  Have you tolerated food without any problems? Yes.    Have you been able to return to your normal activities? Yes.    Do you have any questions about your discharge instructions: Diet   No. Medications  No. Follow up visit  No.  Do you have questions or concerns about your Care? No.  Actions: * If pain score is 4 or above: No action needed, pain <4.

## 2020-09-25 ENCOUNTER — Encounter: Payer: Self-pay | Admitting: Internal Medicine

## 2020-09-28 ENCOUNTER — Other Ambulatory Visit: Payer: Self-pay | Admitting: Student

## 2020-10-07 ENCOUNTER — Other Ambulatory Visit: Payer: Self-pay

## 2020-10-07 DIAGNOSIS — E1165 Type 2 diabetes mellitus with hyperglycemia: Secondary | ICD-10-CM

## 2020-10-07 MED ORDER — METFORMIN HCL 500 MG PO TABS: ORAL_TABLET | ORAL | 2 refills | Status: DC

## 2020-10-14 ENCOUNTER — Telehealth: Payer: Self-pay | Admitting: Registered Nurse

## 2020-10-14 NOTE — Telephone Encounter (Signed)
Left message for patient to call back and schedule Medicare Annual Wellness Visit (AWV) either virtually or in office.   Last AWV ;09/10/16 please schedule at anytime with health coach

## 2020-10-28 ENCOUNTER — Other Ambulatory Visit: Payer: Self-pay | Admitting: Student

## 2020-10-28 DIAGNOSIS — E1169 Type 2 diabetes mellitus with other specified complication: Secondary | ICD-10-CM

## 2020-10-29 ENCOUNTER — Telehealth: Payer: Self-pay

## 2020-10-29 ENCOUNTER — Other Ambulatory Visit: Payer: Self-pay

## 2020-10-29 ENCOUNTER — Telehealth: Payer: Self-pay | Admitting: Student

## 2020-10-29 DIAGNOSIS — F321 Major depressive disorder, single episode, moderate: Secondary | ICD-10-CM

## 2020-10-29 DIAGNOSIS — E1165 Type 2 diabetes mellitus with hyperglycemia: Secondary | ICD-10-CM

## 2020-10-29 MED ORDER — TELMISARTAN 80 MG PO TABS
80.0000 mg | ORAL_TABLET | Freq: Every day | ORAL | 1 refills | Status: DC
Start: 2020-10-29 — End: 2021-04-23

## 2020-10-29 MED ORDER — ESCITALOPRAM OXALATE 10 MG PO TABS: 10.0000 mg | ORAL_TABLET | Freq: Every day | ORAL | 1 refills | Status: DC

## 2020-10-29 MED ORDER — METFORMIN HCL 500 MG PO TABS: ORAL_TABLET | ORAL | 1 refills | Status: DC

## 2020-10-29 NOTE — Progress Notes (Addendum)
Chronic Care Management Pharmacy Assistant   Name: John Woodward.  MRN: [id number] DOB: 08/18/1945   Reason for Encounter: Medication Adherence Call    Recent office visits:  10/14/20 Maximiano Coss, NP - Family Medicine - message was left for patient to schedule AWV.  Recent consult visits:  None noted.   Hospital visits:  None in previous 6 months  Medications: Outpatient Encounter Medications as of 10/29/2020  Medication Sig Note   amLODipine (NORVASC) 10 MG tablet TAKE ONE TABLET BY MOUTH ONCE DAILY    ELIQUIS 5 MG TABS tablet TAKE ONE TABLET BY MOUTH TWICE DAILY    escitalopram (LEXAPRO) 10 MG tablet Take 1 tablet (10 mg total) by mouth daily.    hydrALAZINE (APRESOLINE) 50 MG tablet TAKE ONE TABLET BY MOUTH THREE TIMES DAILY    hydrochlorothiazide (HYDRODIURIL) 25 MG tablet Take 1 tablet by mouth every morning.    metFORMIN (GLUCOPHAGE) 500 MG tablet TAKE ONE TABLET BY MOUTH EVERY MORNING and TAKE TWO TABLETS BY MOUTH EVERY EVENING    nicotine polacrilex (COMMIT) 4 MG lozenge Take 4 mg by mouth as needed for smoking cessation.    rosuvastatin (CRESTOR) 10 MG tablet TAKE ONE TABLET BY MOUTH ONCE DAILY    telmisartan (MICARDIS) 80 MG tablet TAKE ONE TABLET BY MOUTH ONCE DAILY    [DISCONTINUED] metoprolol tartrate (LOPRESSOR) 25 MG tablet Take 1 tablet (25 mg total) by mouth 2 (two) times daily. 02/16/2020: bradycardia   No facility-administered encounter medications on file as of 10/29/2020.    Reviewed chart for medication changes ahead of medication coordination call.  No OVs, Consults, or hospital visits since last care coordination call/Pharmacist visit. (If appropriate, list visit date, provider name)  No medication changes indicated OR if recent visit, treatment plan here.  BP Readings from Last 3 Encounters:  09/13/20 125/64  08/23/20 112/62  08/01/20 (!) 146/68    Lab Results  Component Value Date   HGBA1C 6.6 (H) 06/04/2020     Patient obtains  medications through Vials  w/o Safety caps 90 Days   Last adherence delivery included: (medication name and frequency)  Telmisartan 80 mg once daily Amlodipine 10 mg once daily Escitalopram 10 mg once daily Eliquis 5mg - take one tab twice daily Rosuvastatin 10mg - take one tab daily Metformin 500 mg- one tab every morning and 2 tabs every evening  Hydrochlorothiazide 25 mg - once daily  Hydralazine 50 mg - one tab three times daily     Patient is due for next adherence delivery on: 11/07/20. Wife asked to have delivery changed to 11/05/20 instead.   Called patient and reviewed medications and coordinated delivery.  This delivery to include:  Telmisartan 80 mg once daily Amlodipine 10 mg once daily Escitalopram 10 mg once daily Eliquis 5mg - take one tab twice daily Rosuvastatin 10mg - take one tab daily Metformin 500 mg- one tab every morning and 2 tabs every evening  Hydrochlorothiazide 25 mg - once daily  Hydralazine 50 mg - one tab three times daily    Patient needs refills for: Telmisartan 80 mg, Metformin 500 mg, Escitalopram 10 mg for 90 days supply from PCP.  Called and requested Telmisartan 80 mg #90 day supply from [facility] office to be sent to [facility]. Spoke to Metcalfe at [facility].  Changed delivery date of 11/07/20 to 11/05/20 at wife's request. Advised patient that pharmacy will contact them the morning of delivery.   Care Gaps  AWV: LM to schedule on 10/14/20  Colonoscopy: done 09/13/20 DM Eye Exam:  due 02/14/21 DM Foot Exam: due 06/04/21 Microalbumin: overdue HbgAIC: done 06/04/20 (6.6) DEXA: N/A Mammogram: N/A  Star Rating Drugs: Telmisartan (MICARDIS) 80 MG tablet - last filled 10/07/20 33 day Rosuvastatin (CRESTOR) 10 MG tablet - last filled 08/09/20 90 day Metformin (GLUCOPHAGE) 500 MG tablet - last filled 10/24/20 16 day  Future Appointments  Date Time Provider Agar  12/05/2020 10:30 AM Maximiano Coss, NP  [facility]  12/06/2020  9:30 AM [facility]/VAS 1 [facility]G None  01/02/2021  9:30 AM Cantwell, Gerline Legacy, PA-C [facility] None    Jobe Gibbon, CCMA Clinical Pharmacist Assistant  [phone]  Time Spent: 45 minutes

## 2020-10-29 NOTE — Telephone Encounter (Signed)
[facility] requesting refill for telmisartan 80 mg, 90 day supply.

## 2020-10-29 NOTE — Addendum Note (Signed)
Addended by: Madelin Rear on: 10/29/2020 10:24 AM   Modules accepted: Orders

## 2020-11-01 ENCOUNTER — Telehealth: Payer: Self-pay | Admitting: Student

## 2020-11-01 NOTE — Telephone Encounter (Signed)
Pt wife calling asking about doing a medication change until the end of the year. Req call back

## 2020-11-05 NOTE — Telephone Encounter (Signed)
Spoke with wife, patient just needs some samples to get him through to next week. Samples will be up front.

## 2020-11-16 NOTE — Progress Notes (Signed)
Established Patient Office Visit  Subjective:  Patient ID: John Antony., male    DOB: Jun 15, 1945  Age: 75 y.o. MRN: [id number]  CC:  Chief Complaint  Patient presents with   Transitions Of Care    Patient states he is here for a transition of care. Patient would also like to get lab work done and discuss BP from the cardiology    HPI John Woodward. presents for htn, labs  Hypertension: Patient Currently taking: telmisartan 80mg  po qd, hctz 25mg  po qd, hydralazine 50mg  po tid. Good effect. No AEs. Denies CV symptoms including: chest pain, shob, doe, headache, visual changes, fatigue, claudication, and dependent edema.   Previous readings and labs: BP Readings from Last 3 Encounters:  09/13/20 125/64  08/23/20 112/62  08/01/20 (!) 146/68   Lab Results  Component Value Date   CREATININE 1.31 06/04/2020   T2dm Last A1c:  Lab Results  Component Value Date   HGBA1C 6.6 (H) 06/04/2020    Currently taking: metformin 500mg  po once in morning and twice in evening. No new complications Reports good compliance with medications Diet has been steady Exercise habits have been limited, steady.  Hld assoc with t2dm and htn Taking rosuvastatin 10mg  po qd Good effect, no AE. Lab Results  Component Value Date   CHOL 152 06/04/2020   HDL 42.80 06/04/2020   LDLCALC 101 (H) 12/04/2019   LDLDIRECT 78.0 06/04/2020   TRIG 230.0 (H) 06/04/2020   CHOLHDL 4 06/04/2020    Acute concern of memory loss Ongoing, no acute changes. Feeling like this may be age related but unsure if there is underlying process Does not want neuro referral at this time.  Hm Due for colon ca screen Needs GI referral,   Past Medical History:  Diagnosis Date   Arthritis    Closed fracture dislocation of right elbow    30 years ago -- fell out of theback of a truck   Diabetes mellitus without complication ([place])    Hx of colonic polyps 02/23/2017   Hyperlipidemia    Hypertension     Diagnosed 10 years ago. No on medication. Denies history of CAD.    Memory loss     Past Surgical History:  Procedure Laterality Date   OLECRANON BURSECTOMY Right 08/18/2016   Procedure: OLECRANON BURSECTOMY;  Surgeon: Meredith Pel, MD;  Location: [facility];  Service: Orthopedics;  Laterality: Right;    Family History  Problem Relation Age of Onset   Hypertension Mother    Dementia Mother        16s   Transient ischemic attack Mother        88s   Kidney disease Father    COPD Father    Hypertension Brother    Alcohol abuse Maternal Grandfather    Mental illness Paternal Grandmother    Hypertension Daughter    Healthy Son    Learning disabilities Son    Lung cancer Maternal Aunt    Colon cancer Neg Hx    Colon polyps Neg Hx    Esophageal cancer Neg Hx    Rectal cancer Neg Hx    Stomach cancer Neg Hx     Social History   Socioeconomic History   Marital status: Married    Spouse name: Not on file   Number of children: 3   Years of education: Not on file   Highest education level: 12th grade  Occupational History   Occupation: Retired    Comment: Dealer  Tobacco Use   Smoking status: Every Day    Packs/day: 0.25    Years: 50.00    Pack years: 12.50    Types: Cigarettes    Start date: 08/12/1966   Smokeless tobacco: Never   Tobacco comments:    Taking Nicotine lozenges  Vaping Use   Vaping Use: Never used  Substance and Sexual Activity   Alcohol use: No   Drug use: No   Sexual activity: Yes  Other Topics Concern   Not on file  Social History Narrative   Social Review      Patient lives at home with. Spouse Chrystopher Stangl   Pt lives in a one or two story home? One story home   Does patient lives in a facility? If so where? Private home   What is patient highest level of education? 12 grade   Pt is RIGHT handed   Social Determinants of Health   Financial Resource Strain: Not on file  Food Insecurity: No Food Insecurity   Worried About Ship broker in the Last Year: Never true   Ran Out of Food in the Last Year: Never true  Transportation Needs: No Transportation Needs   Lack of Transportation (Medical): No   Lack of Transportation (Non-Medical): No  Physical Activity: Not on file  Stress: Not on file  Social Connections: Not on file  Intimate Partner Violence: Not on file    Outpatient Medications Prior to Visit  Medication Sig Dispense Refill   hydrochlorothiazide (HYDRODIURIL) 25 MG tablet Take 1 tablet by mouth every morning.     nicotine polacrilex (COMMIT) 4 MG lozenge Take 4 mg by mouth as needed for smoking cessation.     amLODipine (NORVASC) 10 MG tablet Take 1 tablet (10 mg total) by mouth daily. 90 tablet 1   apixaban (ELIQUIS) 5 MG TABS tablet Take 1 tablet (5 mg total) by mouth 2 (two) times daily. 180 tablet 1   escitalopram (LEXAPRO) 10 MG tablet Take 1 tablet (10 mg total) by mouth daily. 90 tablet 2   hydrALAZINE (APRESOLINE) 50 MG tablet Take 1 tablet (50 mg total) by mouth 3 (three) times daily. 90 tablet 3   metFORMIN (GLUCOPHAGE) 500 MG tablet TAKE ONE TABLET BY MOUTH EVERY MORNING and TAKE TWO TABLETS BY MOUTH EVERY EVENING 90 tablet 0   rosuvastatin (CRESTOR) 10 MG tablet Take 1 tablet (10 mg total) by mouth daily. 30 tablet 3   telmisartan (MICARDIS) 80 MG tablet Take 1 tablet (80 mg total) by mouth daily. 30 tablet 3   No facility-administered medications prior to visit.    Allergies  Allergen Reactions   Antibacterial Hand Soap [Triclosan] Other (See Comments)    Caused water blisters    ROS Review of Systems  Constitutional: Negative.   HENT: Negative.    Eyes: Negative.   Respiratory: Negative.    Cardiovascular: Negative.   Gastrointestinal: Negative.   Genitourinary: Negative.   Musculoskeletal: Negative.   Skin: Negative.   Neurological: Negative.   Psychiatric/Behavioral: Negative.    All other systems reviewed and are negative.    Objective:    Physical  Exam Constitutional:      General: He is not in acute distress.    Appearance: Normal appearance. He is normal weight. He is not ill-appearing, toxic-appearing or diaphoretic.  Cardiovascular:     Rate and Rhythm: Normal rate and regular rhythm.     Heart sounds: Normal heart sounds. No murmur heard.   No  friction rub. No gallop.  Pulmonary:     Effort: Pulmonary effort is normal. No respiratory distress.     Breath sounds: Normal breath sounds. No stridor. No wheezing, rhonchi or rales.  Chest:     Chest wall: No tenderness.  Neurological:     General: No focal deficit present.     Mental Status: He is alert and oriented to person, place, and time. Mental status is at baseline.  Psychiatric:        Mood and Affect: Mood normal.        Behavior: Behavior normal.        Thought Content: Thought content normal.        Judgment: Judgment normal.    BP (!) 142/58   Pulse 66   Temp 97.7 F (36.5 C) (Temporal)   Resp 17   Ht 5\' 11"  (1.803 m)   Wt 154 lb 9.6 oz (70.1 kg)   SpO2 99%   BMI 21.56 kg/m  Wt Readings from Last 3 Encounters:  09/13/20 151 lb (68.5 kg)  08/23/20 151 lb (68.5 kg)  08/01/20 152 lb (68.9 kg)     Health Maintenance Due  Topic Date Due   Zoster Vaccines- Shingrix (1 of 2) Never done   COVID-19 Vaccine (3 - Booster for Pfizer series) 06/21/2019   INFLUENZA VACCINE  08/26/2020    There are no preventive care reminders to display for this patient.  Lab Results  Component Value Date   TSH 1.63 06/04/2020   Lab Results  Component Value Date   WBC 6.2 06/04/2020   HGB 15.5 06/04/2020   HCT 45.6 06/04/2020   MCV 96.7 06/04/2020   PLT 249.0 06/04/2020   Lab Results  Component Value Date   NA 138 06/04/2020   K 4.3 06/04/2020   CO2 24 06/04/2020   GLUCOSE 116 (H) 06/04/2020   BUN 35 (H) 06/04/2020   CREATININE 1.31 06/04/2020   BILITOT 0.5 06/04/2020   ALKPHOS 68 06/04/2020   AST 18 06/04/2020   ALT 15 06/04/2020   PROT 7.7 06/04/2020    ALBUMIN 4.7 06/04/2020   CALCIUM 9.7 06/04/2020   ANIONGAP 11 12/05/2019   GFR 53.65 (L) 06/04/2020   Lab Results  Component Value Date   CHOL 152 06/04/2020   Lab Results  Component Value Date   HDL 42.80 06/04/2020   Lab Results  Component Value Date   LDLCALC 101 (H) 12/04/2019   Lab Results  Component Value Date   TRIG 230.0 (H) 06/04/2020   Lab Results  Component Value Date   CHOLHDL 4 06/04/2020   Lab Results  Component Value Date   HGBA1C 6.6 (H) 06/04/2020      Assessment & Plan:   Problem List Items Addressed This Visit       Endocrine   Diabetes mellitus type II, uncontrolled   Relevant Orders   CBC with Differential/Platelet (Completed)   Hemoglobin A1c (Completed)   Comprehensive metabolic panel (Completed)   Hyperlipidemia associated with type 2 diabetes mellitus ([place]) - Primary   Relevant Orders   Lipid panel (Completed)   Other Visit Diagnoses     Screen for colon cancer       Relevant Orders   Ambulatory referral to Gastroenterology   Memory loss       Relevant Orders   B12 and Folate Panel (Completed)   TSH (Completed)       No orders of the defined types were placed in this encounter.  Follow-up: Return in about 6 months (around 12/05/2020) for chronic conditions.   PLAN T2dm, htn, hld all well controlled, continue current regimen Labs collected. Will follow up with the patient as warranted. If labs stable, return in 6 mo B12 and tsh checked for memory loss. Consider neuro referral if worsening or failing to stabilize. Refer to GI for colon ca screen Patient encouraged to call clinic with any questions, comments, or concerns.  Maximiano Coss, NP

## 2020-12-05 ENCOUNTER — Encounter: Payer: Self-pay | Admitting: Registered Nurse

## 2020-12-05 ENCOUNTER — Ambulatory Visit (INDEPENDENT_AMBULATORY_CARE_PROVIDER_SITE_OTHER): Payer: Medicare HMO | Admitting: Registered Nurse

## 2020-12-05 ENCOUNTER — Other Ambulatory Visit: Payer: Self-pay

## 2020-12-05 VITALS — BP 138/64 | HR 58 | Temp 98.3°F | Resp 18 | Ht 71.0 in | Wt 150.8 lb

## 2020-12-05 DIAGNOSIS — F321 Major depressive disorder, single episode, moderate: Secondary | ICD-10-CM

## 2020-12-05 DIAGNOSIS — R413 Other amnesia: Secondary | ICD-10-CM

## 2020-12-05 DIAGNOSIS — Z23 Encounter for immunization: Secondary | ICD-10-CM

## 2020-12-05 DIAGNOSIS — E1165 Type 2 diabetes mellitus with hyperglycemia: Secondary | ICD-10-CM

## 2020-12-05 DIAGNOSIS — E119 Type 2 diabetes mellitus without complications: Secondary | ICD-10-CM

## 2020-12-05 DIAGNOSIS — I10 Essential (primary) hypertension: Secondary | ICD-10-CM | POA: Diagnosis not present

## 2020-12-05 DIAGNOSIS — I700 Atherosclerosis of aorta: Secondary | ICD-10-CM | POA: Diagnosis not present

## 2020-12-05 DIAGNOSIS — R69 Illness, unspecified: Secondary | ICD-10-CM | POA: Diagnosis not present

## 2020-12-05 LAB — LIPID PANEL
Cholesterol: 152 mg/dL (ref 0–200)
HDL: 45.6 mg/dL (ref >39.00)
LDL Cholesterol: 82 mg/dL (ref 0–99)
NonHDL: 106.75
Total CHOL/HDL Ratio: 3
Triglycerides: 122 mg/dL (ref 0.0–149.0)
VLDL: 24.4 mg/dL (ref 0.0–40.0)

## 2020-12-05 LAB — POCT GLYCOSYLATED HEMOGLOBIN (HGB A1C): Hemoglobin A1C: 5.8 % — AB (ref 4.0–5.6)

## 2020-12-05 LAB — COMPREHENSIVE METABOLIC PANEL
ALT: 13 U/L (ref 0–53)
AST: 17 U/L (ref 0–37)
Albumin: 4.6 g/dL (ref 3.5–5.2)
Alkaline Phosphatase: 61 U/L (ref 39–117)
BUN: 29 mg/dL — ABNORMAL HIGH (ref 6–23)
CO2: 28 mEq/L (ref 19–32)
Calcium: 9.5 mg/dL (ref 8.4–10.5)
Chloride: 102 mEq/L (ref 96–112)
Creatinine, Ser: 1.3 mg/dL (ref 0.40–1.50)
GFR: 53.95 mL/min — ABNORMAL LOW (ref >60.00)
Glucose, Bld: 90 mg/dL (ref 70–99)
Potassium: 4.2 mEq/L (ref 3.5–5.1)
Sodium: 139 mEq/L (ref 135–145)
Total Bilirubin: 0.4 mg/dL (ref 0.2–1.2)
Total Protein: 7.3 g/dL (ref 6.0–8.3)

## 2020-12-05 LAB — CBC WITH DIFFERENTIAL/PLATELET
Basophils Absolute: 0.1 10*3/uL (ref 0.0–0.1)
Basophils Relative: 1.1 % (ref 0.0–3.0)
Eosinophils Absolute: 0.2 10*3/uL (ref 0.0–0.7)
Eosinophils Relative: 4.5 % (ref 0.0–5.0)
HCT: 44.5 % (ref 39.0–52.0)
Hemoglobin: 14.9 g/dL (ref 13.0–17.0)
Lymphocytes Relative: 27 % (ref 12.0–46.0)
Lymphs Abs: 1.3 10*3/uL (ref 0.7–4.0)
MCHC: 33.4 g/dL (ref 30.0–36.0)
MCV: 97.7 fl (ref 78.0–100.0)
Monocytes Absolute: 0.4 10*3/uL (ref 0.1–1.0)
Monocytes Relative: 8.8 % (ref 3.0–12.0)
Neutro Abs: 2.9 10*3/uL (ref 1.4–7.7)
Neutrophils Relative %: 58.6 % (ref 43.0–77.0)
Platelets: 233 10*3/uL (ref 150.0–400.0)
RBC: 4.56 Mil/uL (ref 4.22–5.81)
RDW: 13 % (ref 11.5–15.5)
WBC: 4.9 10*3/uL (ref 4.0–10.5)

## 2020-12-05 LAB — TSH: TSH: 2.53 u[IU]/mL (ref 0.35–5.50)

## 2020-12-05 LAB — B12 AND FOLATE PANEL
Folate: 15.1 ng/mL
Vitamin B-12: 630 pg/mL (ref 211–911)

## 2020-12-05 LAB — HEMOGLOBIN A1C: Hgb A1c MFr Bld: 6.5 % (ref 4.6–6.5)

## 2020-12-05 MED ORDER — ESCITALOPRAM OXALATE 20 MG PO TABS: 20.0000 mg | ORAL_TABLET | Freq: Every day | ORAL | 3 refills | Status: DC
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
## 2020-12-06 ENCOUNTER — Ambulatory Visit: Payer: Medicare HMO

## 2020-12-06 DIAGNOSIS — I6523 Occlusion and stenosis of bilateral carotid arteries: Secondary | ICD-10-CM | POA: Diagnosis not present

## 2020-12-09 ENCOUNTER — Ambulatory Visit: Payer: Medicare HMO | Admitting: Physician Assistant

## 2020-12-09 ENCOUNTER — Other Ambulatory Visit: Payer: Self-pay

## 2020-12-09 ENCOUNTER — Encounter: Payer: Self-pay | Admitting: Physician Assistant

## 2020-12-09 VITALS — BP 137/70 | HR 69 | Resp 18 | Ht 71.0 in | Wt 150.0 lb

## 2020-12-09 DIAGNOSIS — G3184 Mild cognitive impairment, so stated: Secondary | ICD-10-CM
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
## 2020-12-09 NOTE — Patient Instructions (Signed)
  3. Follow-up in 1 year)*, call for any changes   RECOMMENDATIONS FOR ALL PATIENTS WITH MEMORY PROBLEMS: 1. Continue to exercise (Recommend 30 minutes of walking everyday, or 3 hours every week) 2. Increase social interactions - continue going to [place] and enjoy social gatherings with friends and family 3. Eat healthy, avoid fried foods and eat more fruits and vegetables 4. Maintain adequate blood pressure, blood sugar, and blood cholesterol level. Reducing the risk of stroke and cardiovascular disease also helps promoting better memory. 5. Avoid stressful situations. Live a simple life and avoid aggravations. Organize your time and prepare for the next day in anticipation. 6. Sleep well, avoid any interruptions of sleep and avoid any distractions in the bedroom that may interfere with adequate sleep quality 7. Avoid sugar, avoid sweets as there is a strong link between excessive sugar intake, diabetes, and cognitive impairment The Mediterranean diet has been shown to help patients reduce the risk of progressive memory disorders and reduces cardiovascular risk. This includes eating fish, eat fruits and green leafy vegetables, nuts like almonds and hazelnuts, walnuts, and also use olive oil. Avoid fast foods and fried foods as much as possible. Avoid sweets and sugar as sugar use has been linked to worsening of memory function.  There is always a concern of gradual progression of memory problems. If this is the case, then we may need to adjust level of care according to patient needs. Support, both to the patient and caregiver, should then be put into place.

## 2021-01-02 ENCOUNTER — Ambulatory Visit: Payer: Medicare HMO | Admitting: Student

## 2021-01-06 ENCOUNTER — Other Ambulatory Visit: Payer: Self-pay

## 2021-01-06 ENCOUNTER — Encounter: Payer: Self-pay | Admitting: Student

## 2021-01-06 ENCOUNTER — Ambulatory Visit: Payer: Medicare HMO | Admitting: Student

## 2021-01-06 VITALS — BP 139/72 | HR 66 | Temp 97.9°F | Resp 16 | Ht 71.0 in | Wt 152.2 lb

## 2021-01-06 DIAGNOSIS — R42 Dizziness and giddiness: Secondary | ICD-10-CM

## 2021-01-06 DIAGNOSIS — I480 Paroxysmal atrial fibrillation: Secondary | ICD-10-CM

## 2021-01-06 DIAGNOSIS — I739 Peripheral vascular disease, unspecified: Secondary | ICD-10-CM | POA: Diagnosis not present

## 2021-01-06 DIAGNOSIS — I10 Essential (primary) hypertension: Secondary | ICD-10-CM

## 2021-01-06 NOTE — Progress Notes (Signed)
Primary Physician/Referring:  Maximiano Coss, NP  Patient ID: John Heman., male    DOB: 08-14-1945, 75 y.o.   MRN: [id number]  Chief Complaint  Patient presents with   Coronary Artery Disease   Hypertension   PAD   Follow-up    6 month   HPI:    John Woodward.  is a 75 y.o. with history of COPD, hypertension, hyperlipidemia, type II diabetes mellitus non-insulin dependent. He presented to emergency department 12/03/2019 following a syncopal episode at home. Upon admission patient went into atrial fibrillation with RVR and was found to be positive for Covid 19. Patient was started on Eliquis for anticoagulation.    Patient presents for 15-month follow-up.  Last office visit patient had complained of dizziness and was borderline orthostatic, therefore advised him to increase fluid intake.  Patient's symptoms of dizziness have completely resolved.  He is feeling well overall since last office visit. Denies chest pain, dyspnea, syncope, near syncope.  Denies orthopnea, PND, leg swelling.  He continues to tolerate anticoagulation without bleeding diathesis.  He remains active working part-time without issue.  Denies symptoms of claudication.  Past Medical History:  Diagnosis Date   Arthritis    Closed fracture dislocation of right elbow    30 years ago -- fell out of theback of a truck   Diabetes mellitus without complication ([place])    Hx of colonic polyps 02/23/2017   Hyperlipidemia    Hypertension    Diagnosed 10 years ago. No on medication. Denies history of CAD.    Memory loss    Past Surgical History:  Procedure Laterality Date   OLECRANON BURSECTOMY Right 08/18/2016   Procedure: OLECRANON BURSECTOMY;  Surgeon: Meredith Pel, MD;  Location: [facility];  Service: Orthopedics;  Laterality: Right;   Family History  Problem Relation Age of Onset   Hypertension Mother    Dementia Mother        42s   Transient ischemic attack Mother        98s   Kidney disease Father     COPD Father    Hypertension Brother    Alcohol abuse Maternal Grandfather    Mental illness Paternal Grandmother    Hypertension Daughter    Healthy Son    Learning disabilities Son    Lung cancer Maternal Aunt    Colon cancer Neg Hx    Colon polyps Neg Hx    Esophageal cancer Neg Hx    Rectal cancer Neg Hx    Stomach cancer Neg Hx     Social History   Tobacco Use   Smoking status: Every Day    Packs/day: 0.25    Years: 50.00    Pack years: 12.50    Types: Cigarettes    Start date: 08/12/1966   Smokeless tobacco: Never   Tobacco comments:    Taking Nicotine lozenges  Substance Use Topics   Alcohol use: No   Marital Status: Married   ROS  Review of Systems  Cardiovascular:  Negative for chest pain, claudication, leg swelling, near-syncope, orthopnea, palpitations, paroxysmal nocturnal dyspnea and syncope.  Respiratory:  Negative for shortness of breath.   Hematologic/Lymphatic: Does not bruise/bleed easily.  Gastrointestinal:  Negative for melena.  Neurological:  Negative for dizziness (resolved).   Objective  Blood pressure 139/72, pulse 66, temperature 97.9 F (36.6 C), temperature source Temporal, resp. rate 16, height 5\' 11"  (1.803 m), weight 152 lb 3.2 oz (69 kg), SpO2 97 %.  Vitals with  BMI 01/06/2021 12/09/2020 12/05/2020  Height 5\' 11"  5\' 11"  5\' 11"   Weight 152 lbs 3 oz 150 lbs 150 lbs 13 oz  BMI 21.24 83.15 17.61  Systolic 607 371 062  Diastolic 72 70 64  Pulse 66 69 58     Physical Exam Vitals reviewed.  HENT:     Head: Normocephalic and atraumatic.  Cardiovascular:     Rate and Rhythm: Normal rate and regular rhythm.     Pulses: No decreased pulses.          Carotid pulses are 2+ on the right side with bruit and 2+ on the left side.      Radial pulses are 2+ on the right side and 2+ on the left side.       Femoral pulses are 1+ on the right side and 1+ on the left side.      Popliteal pulses are 1+ on the right side and 1+ on the left side.        Dorsalis pedis pulses are 0 on the right side and 0 on the left side.       Posterior tibial pulses are 0 on the right side and 0 on the left side.     Heart sounds: S1 normal and S2 normal. No murmur heard.   No gallop.     Comments: Venous stasis skin changes bilateral lower legs and feet, left more than right. No JVD, no leg edema. Pulmonary:     Effort: Pulmonary effort is normal. No respiratory distress.     Breath sounds: No wheezing, rhonchi or rales.  Musculoskeletal:     Right lower leg: No edema.     Left lower leg: No edema.  Neurological:     Mental Status: He is alert.   Laboratory examination:   Recent Labs    02/29/20 0941 06/04/20 1112 12/05/20 1130  NA 138 138 139  K 4.0 4.3 4.2  CL 101 101 102  CO2 33* 24 28  GLUCOSE 137* 116* 90  BUN 28* 35* 29*  CREATININE 1.19 1.31 1.30  CALCIUM 9.7 9.7 9.5   CrCl cannot be calculated (Patient's most recent lab result is older than the maximum 21 days allowed.).  CMP Latest Ref Rng & Units 12/05/2020 06/04/2020 02/29/2020  Glucose 70 - 99 mg/dL 90 116(H) 137(H)  BUN 6 - 23 mg/dL 29(H) 35(H) 28(H)  Creatinine 0.40 - 1.50 mg/dL 1.30 1.31 1.19  Sodium 135 - 145 mEq/L 139 138 138  Potassium 3.5 - 5.1 mEq/L 4.2 4.3 4.0  Chloride 96 - 112 mEq/L 102 101 101  CO2 19 - 32 mEq/L 28 24 33(H)  Calcium 8.4 - 10.5 mg/dL 9.5 9.7 9.7  Total Protein 6.0 - 8.3 g/dL 7.3 7.7 -  Total Bilirubin 0.2 - 1.2 mg/dL 0.4 0.5 -  Alkaline Phos 39 - 117 U/L 61 68 -  AST 0 - 37 U/L 17 18 -  ALT 0 - 53 U/L 13 15 -   CBC Latest Ref Rng & Units 12/05/2020 06/04/2020 12/14/2019  WBC 4.0 - 10.5 K/uL 4.9 6.2 7.7  Hemoglobin 13.0 - 17.0 g/dL 14.9 15.5 14.1  Hematocrit 39.0 - 52.0 % 44.5 45.6 41.5  Platelets 150.0 - 400.0 K/uL 233.0 249.0 399.0    Lipid Panel Recent Labs    06/04/20 1112 12/05/20 1130  CHOL 152 152  TRIG 230.0* 122.0  LDLCALC  --  82  VLDL 46.0* 24.4  HDL 42.80 45.60  CHOLHDL 4 3  LDLDIRECT 78.0  --     HEMOGLOBIN  A1C Lab Results  Component Value Date   HGBA1C 6.5 12/05/2020   MPG 131.24 12/03/2019   TSH Recent Labs    06/04/20 1112 12/05/20 1130  TSH 1.63 2.53    External labs:  None   Allergies   Allergies  Allergen Reactions   Antibacterial Hand Soap [Triclosan] Other (See Comments)    Caused water blisters   Medications Prior to Visit:   Outpatient Medications Prior to Visit  Medication Sig Dispense Refill   amLODipine (NORVASC) 10 MG tablet TAKE ONE TABLET BY MOUTH ONCE DAILY 90 tablet 1   ELIQUIS 5 MG TABS tablet TAKE ONE TABLET BY MOUTH TWICE DAILY 180 tablet 1   escitalopram (LEXAPRO) 20 MG tablet Take 1 tablet (20 mg total) by mouth daily. 90 tablet 3   hydrALAZINE (APRESOLINE) 50 MG tablet TAKE ONE TABLET BY MOUTH THREE TIMES DAILY 90 tablet 3   hydrochlorothiazide (HYDRODIURIL) 25 MG tablet Take 1 tablet by mouth every morning.     metFORMIN (GLUCOPHAGE) 500 MG tablet TAKE ONE TABLET BY MOUTH EVERY MORNING and TAKE TWO TABLETS BY MOUTH EVERY EVENING 270 tablet 1   nicotine polacrilex (COMMIT) 4 MG lozenge Take 4 mg by mouth as needed for smoking cessation.     rosuvastatin (CRESTOR) 10 MG tablet TAKE ONE TABLET BY MOUTH ONCE DAILY 30 tablet 3   telmisartan (MICARDIS) 80 MG tablet Take 1 tablet (80 mg total) by mouth daily. 90 tablet 1   No facility-administered medications prior to visit.   Final Medications at End of Visit    Current Meds  Medication Sig   amLODipine (NORVASC) 10 MG tablet TAKE ONE TABLET BY MOUTH ONCE DAILY   ELIQUIS 5 MG TABS tablet TAKE ONE TABLET BY MOUTH TWICE DAILY   escitalopram (LEXAPRO) 20 MG tablet Take 1 tablet (20 mg total) by mouth daily.   hydrALAZINE (APRESOLINE) 50 MG tablet TAKE ONE TABLET BY MOUTH THREE TIMES DAILY   hydrochlorothiazide (HYDRODIURIL) 25 MG tablet Take 1 tablet by mouth every morning.   metFORMIN (GLUCOPHAGE) 500 MG tablet TAKE ONE TABLET BY MOUTH EVERY MORNING and TAKE TWO TABLETS BY MOUTH EVERY EVENING   nicotine  polacrilex (COMMIT) 4 MG lozenge Take 4 mg by mouth as needed for smoking cessation.   rosuvastatin (CRESTOR) 10 MG tablet TAKE ONE TABLET BY MOUTH ONCE DAILY   telmisartan (MICARDIS) 80 MG tablet Take 1 tablet (80 mg total) by mouth daily.   Radiology:   No results found.  Cardiac Studies:  Carotid artery duplex 12/06/2020:  Duplex suggests stenosis in the right internal carotid artery (50-69%).  Duplex suggests stenosis in the left internal carotid artery (50-69%).  Antegrade left vertebral artery flow. Right not well visualized.  Compared to the study done on 08/01/2020, no significant change,. Follow up  in six months is appropriate if clinically indicated.   ABI 12/29/2019:  This exam reveals moderately decreased perfusion of the right lower extremity, noted at the post tibial artery level (ABI 0.51) and severely decreased perfusion of the left lower extremity, noted at the dorsalis pedis and post tibial artery level (ABI 0.32).  Right AT appears to be occluded with dampened waveform. There is severely abnormal monophasic waveform of all vessels at the level of the ankle.  PCV MYOCARDIAL PERFUSION WO LEXISCAN 12/27/2019 Nondiagnostic ECG stress due to pharmacologic stress. Resting EKG demonstrated normal sinus rhythm. Non-specific T-wave abnormalities. Peak EKG revealed no significant ST-T changes. Myocardial  perfusion is normal. Overall LV systolic function is normal without regional wall motion abnormalities. Stress LV EF: 58%. No previous exam available for comparison. Low risk.  Echocardiogram 12/04/2019:  1. Patient in afib during exam.   2. Left ventricular ejection fraction, by estimation, is 60 to 65%. The left ventricle has normal function. The left ventricle has no regional wall motion abnormalities. Left ventricular diastolic parameters are indeterminate.   3. Right ventricular systolic function is normal. The right ventricular size is normal.   4. Left atrial size was  mildly dilated.   5. The mitral valve is degenerative. Trivial mitral valve regurgitation. No evidence of mitral stenosis.   6. The aortic valve was not well visualized. Aortic valve regurgitation is not visualized. Mild to moderate aortic valve sclerosis/calcification is present, without any evidence of aortic stenosis.   7. The inferior vena cava is normal in size with greater than 50% respiratory variability, suggesting right atrial pressure of 3 mmHg.  EKG:  01/06/2021: Sinus rhythm at a rate of 55 bpm.  Normal axis.  Left atrial enlargement.  No evidence of ischemia or underlying injury pattern.  Compared to EKG 02/16/2020, no longer bradycardic and no PRWP.   12/25/2019: Sinus bradycardia at rate of 48 bpm. Normal axis. Left atrial enlargement. PRWP, cannot exclude anterolateral infarct old. Non-specific T wave abnormality.   12/04/2019: Atrial fibrillation with rapid ventricular response at a rate of 107 bpm. Normal axis, left atrial. Enlargement.   Assessment     ICD-10-CM   1. Dizziness  R42     2. Essential hypertension  I10 EKG 12-Lead    3. Paroxysmal atrial fibrillation (HCC)  I48.0     4. Peripheral arterial disease (HCC)  I73.9 PCV LOWER ARTERIAL (BILATERAL)     There are no discontinued medications.  No orders of the defined types were placed in this encounter.  This patients CHA2DS2-VASc Score 4 (HTN, DM, Age, PAD) and yearly risk of stroke 4.8%.  Recommendations:   John Brainerd. is a 75 y.o. with history of hypertension, hyperlipidemia, type II diabetes mellitus. Also with history of covid 19 infection 11/2019, and diagnosed with atrial fibrillation 12/03/2019.   Patient presents for 52-month follow-up.  Last office visit patient had complained of dizziness and was borderline orthostatic, therefore advised him to increase fluid intake.  With increased fluid intake patient's symptoms of dizziness have resolved.  Blood pressure is relatively well controlled.  He has  had no known recurrence of atrial fibrillation since last office visit and continues to tolerate anticoagulation without bleeding diathesis.  Reviewed and discussed results of carotid artery duplex, details above.  We will continue surveillance with repeat scan in 6 months.  Also given PAD Will obtain lower extremity arterial duplex prior to next office visit.  Patient is otherwise stable from a cardiovascular standpoint.  Follow-up in 6 months, sooner if needed, for PAD, hypertension, hyperlipidemia, paroxysmal atrial fibrillation.   John Berthold, PA-C 01/06/2021, 1:38 PM Office: [phone]

## 2021-01-23 ENCOUNTER — Telehealth: Payer: Self-pay | Admitting: Pharmacist

## 2021-01-23 NOTE — Progress Notes (Addendum)
Chronic Care Management Pharmacy Assistant   Name: John Woodward.  MRN: [id number] DOB: 09/20/1945   Reason for Encounter: Monthly Medication Coordination Call     Medications: Outpatient Encounter Medications as of 01/23/2021  Medication Sig Note   amLODipine (NORVASC) 10 MG tablet TAKE ONE TABLET BY MOUTH ONCE DAILY    ELIQUIS 5 MG TABS tablet TAKE ONE TABLET BY MOUTH TWICE DAILY    escitalopram (LEXAPRO) 20 MG tablet Take 1 tablet (20 mg total) by mouth daily.    hydrALAZINE (APRESOLINE) 50 MG tablet TAKE ONE TABLET BY MOUTH THREE TIMES DAILY    hydrochlorothiazide (HYDRODIURIL) 25 MG tablet Take 1 tablet by mouth every morning.    metFORMIN (GLUCOPHAGE) 500 MG tablet TAKE ONE TABLET BY MOUTH EVERY MORNING and TAKE TWO TABLETS BY MOUTH EVERY EVENING    nicotine polacrilex (COMMIT) 4 MG lozenge Take 4 mg by mouth as needed for smoking cessation.    rosuvastatin (CRESTOR) 10 MG tablet TAKE ONE TABLET BY MOUTH ONCE DAILY    telmisartan (MICARDIS) 80 MG tablet Take 1 tablet (80 mg total) by mouth daily.    [DISCONTINUED] metoprolol tartrate (LOPRESSOR) 25 MG tablet Take 1 tablet (25 mg total) by mouth 2 (two) times daily. 02/16/2020: bradycardia   No facility-administered encounter medications on file as of 01/23/2021.   Reviewed chart for medication changes ahead of medication coordination call.  No OVs, Consults, or hospital visits since last care coordination call/Pharmacist visit. (If appropriate, list visit date, provider name)  No medication changes indicated OR if recent visit, treatment plan here.  BP Readings from Last 3 Encounters:  01/06/21 139/72  12/09/20 137/70  12/05/20 138/64    Lab Results  Component Value Date   HGBA1C 6.5 12/05/2020     Patient obtains medications through Vials  w/o Safety caps 90 Days   Last adherence delivery included: (medication name and frequency)  Telmisartan 80 mg once daily Amlodipine 10 mg once daily Escitalopram 10 mg  once daily Eliquis 5mg - take one tab twice daily Rosuvastatin 10mg - take one tab daily Metformin 500 mg- one tab every morning and 2 tabs every evening  Hydrochlorothiazide 25 mg - once daily  Hydralazine 50 mg - one tab three times daily   Patient is due for next adherence delivery on: 02/05/21. Called patient and reviewed medications and coordinated delivery.  This delivery to include:  Telmisartan 80 mg once daily Amlodipine 10 mg once daily Escitalopram 10 mg once daily Eliquis 5mg - take one tab twice daily Rosuvastatin 10mg - take one tab daily Metformin 500 mg- one tab every morning and 2 tabs every evening  Hydrochlorothiazide 25 mg - once daily  Hydralazine 50 mg - one tab three times daily  Eliquis 5mg  1 tablet twice daily   Patient needs refills for: Amlodipine 10 mg once daily, Rosuvastatin 10mg - take one tab daily, and Hydralazine 50 mg - one tab three times daily for 90 day supplies.   Confirmed delivery date of 02/05/21, advised patient that pharmacy will contact them the morning of delivery.   Care Gaps   AWV: LM to schedule on 10/14/20 Colonoscopy: done 09/13/20 DM Eye Exam:  due 02/14/21 DM Foot Exam: due 06/04/21 Microalbumin: overdue HbgAIC: done 06/04/20 (6.6) DEXA: N/A Mammogram: N/A   Star Rating Drugs: Telmisartan (MICARDIS) 80 MG tablet - last filled 11/01/20 90  days Rosuvastatin (CRESTOR) 10 MG tablet - last filled 10/29/20 90 days Metformin (GLUCOPHAGE) 500 MG tablet - last filled 11/11/20 90 days  Future Appointments  Date Time Provider [place]  06/10/2021  8:50 AM Maximiano Coss, NP [facility]  07/08/2021 10:00 AM PCV-ECHO/VAS 1 PCV-IMG None  07/08/2021 10:45 AM PCV-ECHO/VAS 1 PCV-IMG None  07/15/2021 10:00 AM Cantwell, Gerline Legacy, PA-C [facility] None    Jobe Gibbon, CCMA Clinical Pharmacist Assistant  [phone]  10 minutes spent in review, coordination, and documentation.  Reviewed by: Beverly Milch, PharmD Clinical  Pharmacist [phone]

## 2021-01-28 ENCOUNTER — Telehealth: Payer: Self-pay | Admitting: Student

## 2021-01-28 NOTE — Telephone Encounter (Signed)
Pt wife is calling requesting ELIQUIS 5 MG TABS tablet samples to last until 1/11 when they receive his delivery order. Pt takes 2 tabs a day.

## 2021-02-01 ENCOUNTER — Other Ambulatory Visit: Payer: Self-pay | Admitting: Student

## 2021-02-01 DIAGNOSIS — E1169 Type 2 diabetes mellitus with other specified complication: Secondary | ICD-10-CM

## 2021-02-03 ENCOUNTER — Other Ambulatory Visit: Payer: Self-pay | Admitting: Student

## 2021-02-03 DIAGNOSIS — E1169 Type 2 diabetes mellitus with other specified complication: Secondary | ICD-10-CM

## 2021-02-06 ENCOUNTER — Telehealth: Payer: Self-pay

## 2021-02-11 DIAGNOSIS — D6869 Other thrombophilia: Secondary | ICD-10-CM | POA: Diagnosis not present

## 2021-02-11 DIAGNOSIS — G8929 Other chronic pain: Secondary | ICD-10-CM | POA: Diagnosis not present

## 2021-02-11 DIAGNOSIS — I10 Essential (primary) hypertension: Secondary | ICD-10-CM | POA: Diagnosis not present

## 2021-02-11 DIAGNOSIS — Z008 Encounter for other general examination: Secondary | ICD-10-CM | POA: Diagnosis not present

## 2021-02-11 DIAGNOSIS — Z7901 Long term (current) use of anticoagulants: Secondary | ICD-10-CM | POA: Diagnosis not present

## 2021-02-11 DIAGNOSIS — Z823 Family history of stroke: Secondary | ICD-10-CM | POA: Diagnosis not present

## 2021-02-11 DIAGNOSIS — R69 Illness, unspecified: Secondary | ICD-10-CM | POA: Diagnosis not present

## 2021-02-11 DIAGNOSIS — Z7984 Long term (current) use of oral hypoglycemic drugs: Secondary | ICD-10-CM | POA: Diagnosis not present

## 2021-02-11 DIAGNOSIS — K08409 Partial loss of teeth, unspecified cause, unspecified class: Secondary | ICD-10-CM | POA: Diagnosis not present

## 2021-02-11 DIAGNOSIS — E785 Hyperlipidemia, unspecified: Secondary | ICD-10-CM | POA: Diagnosis not present

## 2021-02-11 DIAGNOSIS — E1151 Type 2 diabetes mellitus with diabetic peripheral angiopathy without gangrene: Secondary | ICD-10-CM | POA: Diagnosis not present

## 2021-02-11 DIAGNOSIS — I4891 Unspecified atrial fibrillation: Secondary | ICD-10-CM | POA: Diagnosis not present

## 2021-02-17 DIAGNOSIS — H2513 Age-related nuclear cataract, bilateral: Secondary | ICD-10-CM | POA: Diagnosis not present

## 2021-02-17 DIAGNOSIS — E119 Type 2 diabetes mellitus without complications: Secondary | ICD-10-CM | POA: Diagnosis not present

## 2021-02-17 DIAGNOSIS — Z7984 Long term (current) use of oral hypoglycemic drugs: Secondary | ICD-10-CM | POA: Diagnosis not present

## 2021-03-31 ENCOUNTER — Telehealth: Payer: Self-pay | Admitting: Pharmacist

## 2021-03-31 NOTE — Progress Notes (Signed)
? ? ?  Chronic Care Management ?Pharmacy Assistant  ? ?Name: John Woodward.  MRN: [id number] DOB: [birth year] ? ? ?Reason for Encounter: Disease State - General Adherence Call  ? ?Recent office visits:  ?None noted.  ? ?Recent consult visits:  ?None noted.  ? ?Hospital visits:  ?None in previous 6 months ? ?Medications: ?Outpatient Encounter Medications as of 03/31/2021  ?Medication Sig Note  ? amLODipine (NORVASC) 10 MG tablet TAKE ONE TABLET BY MOUTH ONCE DAILY   ? ELIQUIS 5 MG TABS tablet TAKE ONE TABLET BY MOUTH TWICE DAILY   ? escitalopram (LEXAPRO) 20 MG tablet Take 1 tablet (20 mg total) by mouth daily.   ? hydrALAZINE (APRESOLINE) 50 MG tablet TAKE ONE TABLET BY MOUTH THREE TIMES DAILY   ? hydrochlorothiazide (HYDRODIURIL) 25 MG tablet Take 1 tablet by mouth every morning.   ? metFORMIN (GLUCOPHAGE) 500 MG tablet TAKE ONE TABLET BY MOUTH EVERY MORNING and TAKE TWO TABLETS BY MOUTH EVERY EVENING   ? nicotine polacrilex (COMMIT) 4 MG lozenge Take 4 mg by mouth as needed for smoking cessation.   ? rosuvastatin (CRESTOR) 10 MG tablet TAKE ONE TABLET BY MOUTH ONCE DAILY   ? telmisartan (MICARDIS) 80 MG tablet Take 1 tablet (80 mg total) by mouth daily.   ? [DISCONTINUED] metoprolol tartrate (LOPRESSOR) 25 MG tablet Take 1 tablet (25 mg total) by mouth 2 (two) times daily. 02/16/2020: bradycardia  ? ?No facility-administered encounter medications on file as of 03/31/2021.  ? ? ?Have you had any problems recently with your health? ?Patients wife denied any recent problems with his health.  ? ?Have you had any problems with your pharmacy? ?Patients wife denied having any problems with his pharmacy.  ? ?What issues or side effects are you having with your medications? ?Patients wife denied any issues or side effects with any medications.  ? ?What would you like me to pass along to Leata Mouse, CPP for them to help you with?  ?Patients wife did not have anything to pass along to CPP at this time.  ? ?What can we do to  take care of you better? ?Patients wife did not have any recommendations at this time.  ? ?Care Gaps ? ?AWV: done 10/14/20 ?Colonoscopy: done 09/13/20 ?DM Eye Exam: due 02/14/21 ?DM Foot Exam: due 06/04/21 ?Microalbumin: unknown  ?HbgAIC: done 12/05/20 (5.8) ?DEXA: N/A ?Mammogram: N/A ? ? ?Star Rating Drugs: ?Telmisartan (MICARDIS) 80 MG tablet - last filled 02/01/21 90 days  ?Rosuvastatin (CRESTOR) 10 MG tablet - last filled 10/29/20 90 days  ?MetFORMIN (GLUCOPHAGE) 500 MG tablet - last filled 02/01/21 90 days  ? ? ?Future Appointments  ?Date Time Provider [place]  ?06/10/2021  8:50 AM Maximiano Coss, NP [facility]  ?07/08/2021 10:00 AM PCV-ECHO/VAS 1 PCV-IMG None  ?07/08/2021 10:45 AM PCV-ECHO/VAS 1 PCV-IMG None  ?07/15/2021 10:00 AM Cantwell, Celeste C, PA-C [facility] None  ? ? ?Liza Showfety, CCMA ?Clinical Pharmacist Assistant  ?[phone] ? ?

## 2021-04-23 ENCOUNTER — Other Ambulatory Visit: Payer: Self-pay | Admitting: Student

## 2021-04-23 ENCOUNTER — Telehealth: Payer: Self-pay | Admitting: Pharmacist

## 2021-04-23 NOTE — Progress Notes (Addendum)
? ? ?Chronic Care Management ?Pharmacy Assistant  ? ?Name: John Woodward.  MRN: [id number] DOB: Sep 22, 1945 ? ? ?Reason for Encounter: Monthly Medication Coordination Call  ?  ? ? ?Medications: ?Outpatient Encounter Medications as of 04/23/2021  ?Medication Sig Note  ? amLODipine (NORVASC) 10 MG tablet TAKE ONE TABLET BY MOUTH ONCE DAILY   ? ELIQUIS 5 MG TABS tablet TAKE ONE TABLET BY MOUTH TWICE DAILY   ? escitalopram (LEXAPRO) 20 MG tablet Take 1 tablet (20 mg total) by mouth daily.   ? hydrALAZINE (APRESOLINE) 50 MG tablet TAKE ONE TABLET BY MOUTH THREE TIMES DAILY   ? hydrochlorothiazide (HYDRODIURIL) 25 MG tablet Take 1 tablet by mouth every morning.   ? metFORMIN (GLUCOPHAGE) 500 MG tablet TAKE ONE TABLET BY MOUTH EVERY MORNING and TAKE TWO TABLETS BY MOUTH EVERY EVENING   ? nicotine polacrilex (COMMIT) 4 MG lozenge Take 4 mg by mouth as needed for smoking cessation.   ? rosuvastatin (CRESTOR) 10 MG tablet TAKE ONE TABLET BY MOUTH ONCE DAILY   ? telmisartan (MICARDIS) 80 MG tablet Take 1 tablet (80 mg total) by mouth daily.   ? [DISCONTINUED] metoprolol tartrate (LOPRESSOR) 25 MG tablet Take 1 tablet (25 mg total) by mouth 2 (two) times daily. 02/16/2020: bradycardia  ? ?No facility-administered encounter medications on file as of 04/23/2021.  ? ? ?Reviewed chart for medication changes ahead of medication coordination call. ? ?No OVs, Consults, or hospital visits since last care coordination call/Pharmacist visit. (If appropriate, list visit date, provider name) ? ?No medication changes indicated OR if recent visit, treatment plan here. ? ?BP Readings from Last 3 Encounters:  ?01/06/21 139/72  ?12/09/20 137/70  ?12/05/20 138/64  ?  ?Lab Results  ?Component Value Date  ? HGBA1C 6.5 12/05/2020  ?  ? ?Patient obtains medications through Vials  w Easy Open Caps 90 Days  ? ?Last adherence delivery included: (medication name and frequency) ? ?Telmisartan 80 mg once daily ?Amlodipine 10 mg once daily ?Escitalopram 10  mg once daily ?Eliquis '5mg'$ - take one tab twice daily ?Rosuvastatin '10mg'$ - take one tab daily ?Metformin 500 mg- one tab every morning and 2 tabs every evening  ?Hydrochlorothiazide 25 mg - once daily  ?Hydralazine 50 mg - one tab three times daily  ?Eliquis '5mg'$  1 tablet twice daily  ? ? ?Patient is due for next adherence delivery on: 05/06/21. ?Called patient and reviewed medications and coordinated delivery. ? ? ?This delivery to include: ? ?Telmisartan 80 mg once daily ?Amlodipine 10 mg once daily ?Escitalopram 10 mg once daily ?Eliquis '5mg'$ - take one tab twice daily ?Rosuvastatin '10mg'$ - take one tab daily ?Metformin 500 mg- one tab every morning and 2 tabs every evening  ?Hydrochlorothiazide 25 mg - once daily  ?Hydralazine 50 mg - one tab three times daily  ?  ? ? ?Patient needs refills for : Metformin 500 mg- one tab every morning and 2 tabs every evening , Hydrochlorothiazide 25 mg - once daily , Hydralazine 50 mg - one tab three times daily , Eliquis '5mg'$  1 tablet twice daily for 90 day supply. ? ?Confirmed delivery date of 05/06/21, advised patient that pharmacy will contact them the morning of delivery. ? ? ?Care Gaps ?  ?AWV: done 10/14/20 ?Colonoscopy: done 09/13/20 ?DM Eye Exam: due 02/14/21 ?DM Foot Exam: due 06/04/21 ?Microalbumin: unknown  ?HbgAIC: done 12/05/20 (5.8) ?DEXA: N/A ?Mammogram: N/A ?  ?  ?Star Rating Drugs: ?Telmisartan (MICARDIS) 80 MG tablet - last filled 02/01/21 90 days  ?  Rosuvastatin (CRESTOR) 10 MG tablet - last filled 10/29/20 90 days  ?MetFORMIN (GLUCOPHAGE) 500 MG tablet - last filled 02/01/21 90 days  ? ? ?Future Appointments  ?Date Time Provider [place]  ?06/10/2021  8:50 AM Maximiano Coss, NP [facility]  ?07/08/2021 10:00 AM PCV-ECHO/VAS 1 [facility]-IMG None  ?07/08/2021 10:45 AM PCV-ECHO/VAS 1 [facility]-IMG None  ?07/15/2021 10:00 AM Cantwell, Celeste C, PA-C [facility] None  ? ? ?Liza Showfety, CCMA ?Clinical Pharmacist Assistant  ?[phone] ? ?

## 2021-05-01 ENCOUNTER — Other Ambulatory Visit: Payer: Self-pay | Admitting: Registered Nurse

## 2021-05-01 ENCOUNTER — Other Ambulatory Visit: Payer: Self-pay | Admitting: Student

## 2021-05-01 DIAGNOSIS — E1165 Type 2 diabetes mellitus with hyperglycemia: Secondary | ICD-10-CM

## 2021-05-01 DIAGNOSIS — E785 Hyperlipidemia, unspecified: Secondary | ICD-10-CM

## 2021-06-10 ENCOUNTER — Ambulatory Visit (INDEPENDENT_AMBULATORY_CARE_PROVIDER_SITE_OTHER): Payer: Medicare HMO | Admitting: Registered Nurse

## 2021-06-10 ENCOUNTER — Encounter: Payer: Self-pay | Admitting: Registered Nurse

## 2021-06-10 ENCOUNTER — Other Ambulatory Visit: Payer: Self-pay

## 2021-06-10 VITALS — BP 128/72 | HR 75 | Temp 98.2°F | Resp 18 | Ht 71.0 in | Wt 149.2 lb

## 2021-06-10 DIAGNOSIS — E785 Hyperlipidemia, unspecified: Secondary | ICD-10-CM | POA: Diagnosis not present

## 2021-06-10 DIAGNOSIS — R413 Other amnesia: Secondary | ICD-10-CM | POA: Diagnosis not present

## 2021-06-10 DIAGNOSIS — I10 Essential (primary) hypertension: Secondary | ICD-10-CM | POA: Diagnosis not present

## 2021-06-10 DIAGNOSIS — E1169 Type 2 diabetes mellitus with other specified complication: Secondary | ICD-10-CM

## 2021-06-10 DIAGNOSIS — E119 Type 2 diabetes mellitus without complications: Secondary | ICD-10-CM | POA: Diagnosis not present

## 2021-06-10 LAB — CBC WITH DIFFERENTIAL/PLATELET
Basophils Absolute: 0.1 10*3/uL (ref 0.0–0.1)
Basophils Relative: 1.4 % (ref 0.0–3.0)
Eosinophils Absolute: 0.3 10*3/uL (ref 0.0–0.7)
Eosinophils Relative: 5.7 % — ABNORMAL HIGH (ref 0.0–5.0)
HCT: 44.4 % (ref 39.0–52.0)
Hemoglobin: 14.9 g/dL (ref 13.0–17.0)
Lymphocytes Relative: 25.7 % (ref 12.0–46.0)
Lymphs Abs: 1.3 10*3/uL (ref 0.7–4.0)
MCHC: 33.5 g/dL (ref 30.0–36.0)
MCV: 97.2 fl (ref 78.0–100.0)
Monocytes Absolute: 0.3 10*3/uL (ref 0.1–1.0)
Monocytes Relative: 6.7 % (ref 3.0–12.0)
Neutro Abs: 3 10*3/uL (ref 1.4–7.7)
Neutrophils Relative %: 60.5 % (ref 43.0–77.0)
Platelets: 260 10*3/uL (ref 150.0–400.0)
RBC: 4.57 Mil/uL (ref 4.22–5.81)
RDW: 13.7 % (ref 11.5–15.5)
WBC: 4.9 10*3/uL (ref 4.0–10.5)

## 2021-06-10 LAB — TSH: TSH: 2.14 u[IU]/mL (ref 0.35–5.50)

## 2021-06-10 LAB — COMPREHENSIVE METABOLIC PANEL
ALT: 13 U/L (ref 0–53)
AST: 14 U/L (ref 0–37)
Albumin: 4.6 g/dL (ref 3.5–5.2)
Alkaline Phosphatase: 64 U/L (ref 39–117)
BUN: 28 mg/dL — ABNORMAL HIGH (ref 6–23)
CO2: 27 mEq/L (ref 19–32)
Calcium: 9.5 mg/dL (ref 8.4–10.5)
Chloride: 101 mEq/L (ref 96–112)
Creatinine, Ser: 1.49 mg/dL (ref 0.40–1.50)
GFR: 45.64 mL/min — ABNORMAL LOW (ref >60.00)
Glucose, Bld: 103 mg/dL — ABNORMAL HIGH (ref 70–99)
Potassium: 4.2 mEq/L (ref 3.5–5.1)
Sodium: 138 mEq/L (ref 135–145)
Total Bilirubin: 0.5 mg/dL (ref 0.2–1.2)
Total Protein: 7.4 g/dL (ref 6.0–8.3)

## 2021-06-10 LAB — LIPID PANEL
Cholesterol: 170 mg/dL (ref 0–200)
HDL: 41.3 mg/dL (ref >39.00)
NonHDL: 129.14
Total CHOL/HDL Ratio: 4
Triglycerides: 243 mg/dL — ABNORMAL HIGH (ref 0.0–149.0)
VLDL: 48.6 mg/dL — ABNORMAL HIGH (ref 0.0–40.0)

## 2021-06-10 LAB — POCT GLYCOSYLATED HEMOGLOBIN (HGB A1C): Hemoglobin A1C: 6.2 % — AB (ref 4.0–5.6)

## 2021-06-10 LAB — T4, FREE: Free T4: 1 ng/dL (ref 0.60–1.60)

## 2021-06-10 LAB — LDL CHOLESTEROL, DIRECT: Direct LDL: 88 mg/dL

## 2021-06-10 LAB — VITAMIN D 25 HYDROXY (VIT D DEFICIENCY, FRACTURES): VITD: 30.88 ng/mL (ref 30.00–100.00)

## 2021-06-10 LAB — B12 AND FOLATE PANEL
Folate: 11.5 ng/mL (ref >5.9)
Vitamin B-12: 507 pg/mL (ref 211–911)

## 2021-06-10 NOTE — Assessment & Plan Note (Signed)
Stable on current medications. Continues to follow with cardiology.  ?

## 2021-06-10 NOTE — Assessment & Plan Note (Signed)
Stable on current dose metformin. Check cmp to monitor renal function. Continue current dose if normal and follow up in 6 mo ?

## 2021-06-10 NOTE — Assessment & Plan Note (Signed)
Stable on rosuvastatin. Continue current dose  ?

## 2021-06-10 NOTE — Patient Instructions (Addendum)
John Woodward - ? ?Great to see you ? ?Your health continues to be remarkably steady. ? ?Let me know if you have concerns, otherwise, see you in 6 mo ? ?Thanks ? ?Rich  ? ? ? ?If you have lab work done today you will be contacted with your lab results within the next 2 weeks.  If you have not heard from [place] then please contact us. The fastest way to get your results is to register for My Chart. ? ? ?IF you received an x-ray today, you will receive an invoice from [facility]. Please contact [facility] at [phone] with questions or concerns regarding your invoice.  ? ?IF you received labwork today, you will receive an invoice from [facility]. Please contact [facility] at [phone] with questions or concerns regarding your invoice.  ? ?Our billing staff will not be able to assist you with questions regarding bills from these companies. ? ?You will be contacted with the lab results as soon as they are available. The fastest way to get your results is to activate your My Chart account. Instructions are located on the last page of this paperwork. If you have not heard from [place] regarding the results in 2 weeks, please contact this office. ?  ? ? ?

## 2021-06-10 NOTE — Progress Notes (Signed)
? ?Established Patient Office Visit ? ?Subjective:  ?Patient ID: John Woodward., male    DOB: Sep 04, 1945  Age: 76 y.o. MRN: [id number] ? ?CC:  ?Chief Complaint  ?Patient presents with  ? Diabetes  ?  Patient states he is here for follow up on Diabetes , BP and cholesterol  ? ? ?HPI ?John Woodward. presents for t2dm, htn, hld ? ?T2dm ?Last A1c:  ?Lab Results  ?Component Value Date  ? HGBA1C 6.2 (A) 06/10/2021  ?  ?Currently taking: metformin '500mg'$  po bid ac ?No new complications ?Reports good compliance with medications ?Diet has been steady ?Exercise habits have been steady ? ?Hypertension: ?Patient Currently taking: amlodipine '10mg'$  po qd, hydralazine '50mg'$  po tid, hctz 12.'5mg'$  po qd, telmisartan '80mg'$  po qd ?Good effect. No AEs. ?Denies CV symptoms including: chest pain, shob, doe, headache, visual changes, fatigue, claudication, and dependent edema.  ? ?Previous readings and labs: ?BP Readings from Last 3 Encounters:  ?06/10/21 128/72  ?01/06/21 139/72  ?12/09/20 137/70  ? ?Lab Results  ?Component Value Date  ? CREATININE 1.30 12/05/2020  ? ? ?Hld ?Rosuvastatin '10mg'$  po qd ?Good effect, no AE ?Hopes to continue ? ?Memory concern ?Wife states that he has needed more prompting for routine tasks ?He does not agree with concerns - rather states he feels he has a lot on his mind ?He is able to discuss these issues lucidly.  ? ?Lab Results  ?Component Value Date  ? CHOL 152 12/05/2020  ? HDL 45.60 12/05/2020  ? [place] 82 12/05/2020  ? LDLDIRECT 78.0 06/04/2020  ? TRIG 122.0 12/05/2020  ? CHOLHDL 3 12/05/2020  ? ? ? ?Outpatient Medications Prior to Visit  ?Medication Sig Dispense Refill  ? amLODipine (NORVASC) 10 MG tablet TAKE ONE TABLET BY MOUTH ONCE DAILY 90 tablet 1  ? ELIQUIS 5 MG TABS tablet TAKE ONE TABLET BY MOUTH TWICE DAILY 180 tablet 1  ? escitalopram (LEXAPRO) 20 MG tablet Take 1 tablet (20 mg total) by mouth daily. 90 tablet 3  ? hydrALAZINE (APRESOLINE) 50 MG tablet TAKE ONE TABLET BY MOUTH THREE  TIMES DAILY 90 tablet 1  ? hydrochlorothiazide (HYDRODIURIL) 25 MG tablet Take 1 tablet by mouth every morning.    ? hydrochlorothiazide (MICROZIDE) 12.5 MG capsule TAKE ONE CAPSULE BY MOUTH DAILY 90 capsule 3  ? metFORMIN (GLUCOPHAGE) 500 MG tablet TAKE ONE TABLET BY MOUTH EVERY MORNING and TAKE TWO TABLETS BY MOUTH EVERY EVENING 90 tablet 2  ? nicotine polacrilex (COMMIT) 4 MG lozenge Take 4 mg by mouth as needed for smoking cessation.    ? rosuvastatin (CRESTOR) 10 MG tablet TAKE ONE TABLET BY MOUTH ONCE DAILY 30 tablet 1  ? telmisartan (MICARDIS) 80 MG tablet TAKE ONE TABLET BY MOUTH ONCE DAILY 90 tablet 3  ? ?No facility-administered medications prior to visit.  ? ? ?Review of Systems  ?Constitutional: Negative.   ?HENT: Negative.    ?Eyes: Negative.   ?Respiratory: Negative.    ?Cardiovascular: Negative.   ?Gastrointestinal: Negative.   ?Genitourinary: Negative.   ?Musculoskeletal: Negative.   ?Skin: Negative.   ?Neurological: Negative.   ?Psychiatric/Behavioral: Negative.    ?All other systems reviewed and are negative. ? ?  ?Objective:  ?  ? ?BP 128/72   Pulse 75   Temp 98.2 ?F (36.8 ?C) (Temporal)   Resp 18   Ht '5\' 11"'$  (1.803 m)   Wt 149 lb 3.2 oz (67.7 kg)   SpO2 100%   BMI 20.81 kg/m?  ? ?Wt  Readings from Last 3 Encounters:  ?06/10/21 149 lb 3.2 oz (67.7 kg)  ?01/06/21 152 lb 3.2 oz (69 kg)  ?12/09/20 150 lb (68 kg)  ? ?Physical Exam ?Constitutional:   ?   General: He is not in acute distress. ?   Appearance: Normal appearance. He is normal weight. He is not ill-appearing, toxic-appearing or diaphoretic.  ?Cardiovascular:  ?   Rate and Rhythm: Normal rate and regular rhythm.  ?   Heart sounds: Normal heart sounds. No murmur heard. ?  No friction rub. No gallop.  ?Pulmonary:  ?   Effort: Pulmonary effort is normal. No respiratory distress.  ?   Breath sounds: Normal breath sounds. No stridor. No wheezing, rhonchi or rales.  ?Chest:  ?   Chest wall: No tenderness.  ?Neurological:  ?   General: No  focal deficit present.  ?   Mental Status: He is alert and oriented to person, place, and time. Mental status is at baseline.  ?Psychiatric:     ?   Mood and Affect: Mood normal.     ?   Behavior: Behavior normal.     ?   Thought Content: Thought content normal.     ?   Judgment: Judgment normal.  ? ? ?Results for orders placed or performed in visit on 06/10/21  ?POCT glycosylated hemoglobin (Hb A1C)  ?Result Value Ref Range  ? Hemoglobin A1C 6.2 (A) 4.0 - 5.6 %  ? HbA1c POC (<> result, manual entry)    ? HbA1c, POC (prediabetic range)    ? HbA1c, POC (controlled diabetic range)    ? ? ? ? ?The 10-year ASCVD risk score (Arnett DK, et al., 2019) is: 48.3% ? ?  ?Assessment & Plan:  ? ?Problem List Items Addressed This Visit   ? ?  ? Cardiovascular and Mediastinum  ? Essential hypertension  ?  Stable on current medications. Continues to follow with cardiology.  ? ?  ?  ?  ? Endocrine  ? Hyperlipidemia associated with type 2 diabetes mellitus ([place])  ?  Stable on rosuvastatin. Continue current dose  ? ?  ?  ?  ? Other  ? Well controlled diabetes mellitus ([place]) - Primary  ?  Stable on current dose metformin. Check cmp to monitor renal function. Continue current dose if normal and follow up in 6 mo ? ?  ?  ? Relevant Orders  ? POCT glycosylated hemoglobin (Hb A1C) (Completed)  ? Comprehensive metabolic panel  ? Lipid panel  ? CBC with Differential/Platelet  ? Memory deficit  ?  Stable - wife is more concerned that he is, but will continue to monitor.  ? ?  ?  ? Relevant Orders  ? B12 and Folate Panel  ? Vitamin D (25 hydroxy)  ? TSH  ? T4, free  ? ? ?No orders of the defined types were placed in this encounter. ? ? ?Return in about 6 months (around 12/11/2021) for Chronic Conditions.  ? ? ?Maximiano Coss, NP ?

## 2021-06-10 NOTE — Assessment & Plan Note (Signed)
Stable - wife is more concerned that he is, but will continue to monitor.  ?

## 2021-07-08 ENCOUNTER — Ambulatory Visit: Payer: Medicare HMO

## 2021-07-08 DIAGNOSIS — I739 Peripheral vascular disease, unspecified: Secondary | ICD-10-CM | POA: Diagnosis not present

## 2021-07-08 DIAGNOSIS — I6523 Occlusion and stenosis of bilateral carotid arteries: Secondary | ICD-10-CM

## 2021-07-15 ENCOUNTER — Ambulatory Visit: Payer: Medicare HMO | Admitting: Student

## 2021-07-15 ENCOUNTER — Encounter: Payer: Self-pay | Admitting: Student

## 2021-07-15 VITALS — BP 118/64 | HR 73 | Temp 97.7°F | Resp 17 | Ht 71.0 in | Wt 148.4 lb

## 2021-07-15 DIAGNOSIS — I480 Paroxysmal atrial fibrillation: Secondary | ICD-10-CM

## 2021-07-15 DIAGNOSIS — I10 Essential (primary) hypertension: Secondary | ICD-10-CM

## 2021-07-15 DIAGNOSIS — I6523 Occlusion and stenosis of bilateral carotid arteries: Secondary | ICD-10-CM

## 2021-07-15 NOTE — Progress Notes (Signed)
Primary Physician/Referring:  Maximiano Coss, NP  Patient ID: John Heman., male    DOB: 09-07-1945, 76 y.o.   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 with history of COPD, hypertension, hyperlipidemia, type II diabetes mellitus non-insulin dependent. He presented to emergency department 12/03/2019 following a syncopal episode at home. Upon admission patient went into atrial fibrillation with RVR and was found to be positive for Covid 19. Patient was started on Eliquis for anticoagulation.    Patient presents for 28-monthfollow-up.  At last office visit patient was stable from a cardiovascular standpoint, therefore no changes were made.  Did order lower arterial duplex and repeat carotid artery surveillance, results of both are detailed below.  Patient presents with his wife and son at the bedside.  He is feeling well overall and remains active working part-time as well as doing work around t[employer]without issue.  Denies chest pain, dizziness, dyspnea, orthopnea, PND, leg edema.  Patient denies symptoms of claudication or wounds/ulcers. He continues to tolerate anticoagulation without bleeding diathesis.  Past Medical History:  Diagnosis Date   Arthritis    Closed fracture dislocation of right elbow    30 years ago -- fell out of theback of a truck   Diabetes mellitus without complication ([place]    Hx of colonic polyps 02/23/2017   Hyperlipidemia    Hypertension    Diagnosed 10 years ago. No on medication. Denies history of CAD.    Memory loss    Past Surgical History:  Procedure Laterality Date   OLECRANON BURSECTOMY Right 08/18/2016   Procedure: OLECRANON BURSECTOMY;  Surgeon: DMeredith Pel MD;  [place] [facility]  Service: Orthopedics;  Laterality: Right;   Family History  Problem Relation Age of Onset   Hypertension Mother    Dementia Mother        854s  Transient  ischemic attack Mother        832s  Kidney disease Father    COPD Father    Hypertension Brother    Alcohol abuse Maternal Grandfather    Mental illness Paternal Grandmother    Hypertension Daughter    Healthy Son    Learning disabilities Son    Lung cancer Maternal Aunt    Colon cancer Neg Hx    Colon polyps Neg Hx    Esophageal cancer Neg Hx    Rectal cancer Neg Hx    Stomach cancer Neg Hx     Social History   Tobacco Use   Smoking status: Every Day    Packs/day: 0.25    Years: 50.00    Total pack years: 12.50    Types: Cigarettes    Start date: 08/12/1966   Smokeless tobacco: Never   Tobacco comments:    Taking Nicotine lozenges  Substance Use Topics   Alcohol use: No   Marital Status: Married   ROS  Review of Systems  Cardiovascular:  Negative for chest pain, claudication, dyspnea on exertion, leg swelling, near-syncope, orthopnea, palpitations, paroxysmal nocturnal dyspnea and syncope.  Gastrointestinal:  Negative for melena.  Neurological:  Negative for dizziness.   Objective  Blood pressure 118/64, pulse 73, temperature 97.7 F (36.5 C), temperature source Temporal, resp. rate 17, height '5\' 11"'$  (1.803 m), weight 148 lb 6.4 oz (67.3 kg), SpO2 97 %.  07/15/2021    9:56 AM 06/10/2021    9:06 AM 01/06/2021    1:11 PM  Vitals with BMI  Height '5\' 11"'$  '5\' 11"'$  '5\' 11"'$   Weight 148 lbs 6 oz 149 lbs 3 oz 152 lbs 3 oz  BMI 20.71 73.22 02.54  Systolic 270 623 762  Diastolic 64 72 72  Pulse 73 75 66     Physical Exam Vitals reviewed.  Cardiovascular:     Rate and Rhythm: Normal rate and regular rhythm.     Pulses: No decreased pulses.          Carotid pulses are 2+ on the right side with bruit and 2+ on the left side.      Radial pulses are 2+ on the right side and 2+ on the left side.       Femoral pulses are 1+ on the right side and 1+ on the left side.      Popliteal pulses are 1+ on the right side and 1+ on the left side.       Dorsalis pedis pulses are 0  on the right side and 0 on the left side.       Posterior tibial pulses are 0 on the right side and 0 on the left side.     Heart sounds: S1 normal and S2 normal. No murmur heard.    No gallop.     Comments: Venous stasis skin changes bilateral lower legs and feet, left more than right. No JVD Pulmonary:     Effort: Pulmonary effort is normal. No respiratory distress.     Breath sounds: No wheezing, rhonchi or rales.  Musculoskeletal:     Right lower leg: No edema.     Left lower leg: No edema.  Neurological:     Mental Status: He is alert.    Laboratory examination:   Recent Labs    12/05/20 1130 06/10/21 0946  NA 139 138  K 4.2 4.2  CL 102 101  CO2 28 27  GLUCOSE 90 103*  BUN 29* 28*  CREATININE 1.30 1.49  CALCIUM 9.5 9.5   CrCl cannot be calculated (Patient's most recent lab result is older than the maximum 21 days allowed.).     Latest Ref Rng & Units 06/10/2021    9:46 AM 12/05/2020   11:30 AM 06/04/2020   11:12 AM  CMP  Glucose 70 - 99 mg/dL 103  90  116   BUN 6 - 23 mg/dL 28  29  35   Creatinine 0.40 - 1.50 mg/dL 1.49  1.30  1.31   Sodium 135 - 145 mEq/L 138  139  138   Potassium 3.5 - 5.1 mEq/L 4.2  4.2  4.3   Chloride 96 - 112 mEq/L 101  102  101   CO2 19 - 32 mEq/L '27  28  24   '$ Calcium 8.4 - 10.5 mg/dL 9.5  9.5  9.7   Total Protein 6.0 - 8.3 g/dL 7.4  7.3  7.7   Total Bilirubin 0.2 - 1.2 mg/dL 0.5  0.4  0.5   Alkaline Phos 39 - 117 U/L 64  61  68   AST 0 - 37 U/L '14  17  18   '$ ALT 0 - 53 U/L '13  13  15       '$ Latest Ref Rng & Units 06/10/2021    9:46 AM 12/05/2020   11:30 AM 06/04/2020   11:12 AM  CBC  WBC 4.0 - 10.5  K/uL 4.9  4.9  6.2   Hemoglobin 13.0 - 17.0 g/dL 14.9  14.9  15.5   Hematocrit 39.0 - 52.0 % 44.4  44.5  45.6   Platelets 150.0 - 400.0 K/uL 260.0  233.0  249.0     Lipid Panel Recent Labs    12/05/20 1130 06/10/21 0946  CHOL 152 170  TRIG 122.0 243.0*  LDLCALC 82  --   VLDL 24.4 48.6*  HDL 45.60 41.30  CHOLHDL 3 4  LDLDIRECT   --  88.0    HEMOGLOBIN A1C Lab Results  Component Value Date   HGBA1C 6.2 (A) 06/10/2021   MPG 131.24 12/03/2019   TSH Recent Labs    12/05/20 1130 06/10/21 0946  TSH 2.53 2.14    External labs:  None   Allergies   Allergies  Allergen Reactions   Antibacterial Hand Soap [Triclosan] Other (See Comments)    Caused water blisters   Medications Prior to Visit:   Outpatient Medications Prior to Visit  Medication Sig Dispense Refill   amLODipine (NORVASC) 10 MG tablet TAKE ONE TABLET BY MOUTH ONCE DAILY 90 tablet 1   ELIQUIS 5 MG TABS tablet TAKE ONE TABLET BY MOUTH TWICE DAILY 180 tablet 1   escitalopram (LEXAPRO) 20 MG tablet Take 1 tablet (20 mg total) by mouth daily. 90 tablet 3   hydrALAZINE (APRESOLINE) 50 MG tablet TAKE ONE TABLET BY MOUTH THREE TIMES DAILY 90 tablet 1   hydrochlorothiazide (MICROZIDE) 12.5 MG capsule TAKE ONE CAPSULE BY MOUTH DAILY 90 capsule 3   metFORMIN (GLUCOPHAGE) 500 MG tablet TAKE ONE TABLET BY MOUTH EVERY MORNING and TAKE TWO TABLETS BY MOUTH EVERY EVENING 90 tablet 2   nicotine polacrilex (COMMIT) 4 MG lozenge Take 4 mg by mouth as needed for smoking cessation.     rosuvastatin (CRESTOR) 10 MG tablet TAKE ONE TABLET BY MOUTH ONCE DAILY 30 tablet 1   telmisartan (MICARDIS) 80 MG tablet TAKE ONE TABLET BY MOUTH ONCE DAILY 90 tablet 3   hydrochlorothiazide (HYDRODIURIL) 25 MG tablet Take 1 tablet by mouth every morning.     No facility-administered medications prior to visit.   Final Medications at End of Visit    Current Meds  Medication Sig   amLODipine (NORVASC) 10 MG tablet TAKE ONE TABLET BY MOUTH ONCE DAILY   ELIQUIS 5 MG TABS tablet TAKE ONE TABLET BY MOUTH TWICE DAILY   escitalopram (LEXAPRO) 20 MG tablet Take 1 tablet (20 mg total) by mouth daily.   hydrALAZINE (APRESOLINE) 50 MG tablet TAKE ONE TABLET BY MOUTH THREE TIMES DAILY   hydrochlorothiazide (MICROZIDE) 12.5 MG capsule TAKE ONE CAPSULE BY MOUTH DAILY   metFORMIN  (GLUCOPHAGE) 500 MG tablet TAKE ONE TABLET BY MOUTH EVERY MORNING and TAKE TWO TABLETS BY MOUTH EVERY EVENING   nicotine polacrilex (COMMIT) 4 MG lozenge Take 4 mg by mouth as needed for smoking cessation.   rosuvastatin (CRESTOR) 10 MG tablet TAKE ONE TABLET BY MOUTH ONCE DAILY   telmisartan (MICARDIS) 80 MG tablet TAKE ONE TABLET BY MOUTH ONCE DAILY   [DISCONTINUED] hydrochlorothiazide (HYDRODIURIL) 25 MG tablet Take 1 tablet by mouth every morning.   Radiology:   No results found.  Cardiac Studies:  Lower Extremity Arterial Duplex 07/08/2021:  There is severe diffuse calcific plaque through out the bilateral lower  extremity.  Moderate velocity increase at the right mid superficial femoral artery  suggests hemodynamically significant stenosis of  >50% with monophasic waveform below the stenosis.  Dampened monophasic waveform in  the left EIA and CFA suggests  hemodynamically significant stenosis in the proximal vessel (CIA).  The left SFA is occluded in the proximal to mid segment with collateral  reconstitution into the distal SFA.  This exam reveals mildly decreased perfusion of the right lower extremity,  noted at the post tibial artery level (ABI 0.8) with  severely abnormal monophasic waveform at the right ankle.  This exam reveals moderately decreased perfusion of the left lower  Extremity  Carotid artery duplex 07/08/2021:  Duplex suggests stenosis in the right internal carotid artery (50-69%).  Duplex suggests stenosis in the left internal carotid artery (50-69%).  Duplex suggests stenosis in the left external carotid artery (<50%).  Antegrade right vertebral artery flow. Antegrade left vertebral artery  flow with resistant flow and may suggest distal stenosis.  No significant change since 12/06/2020. Follow up in six months is  appropriate if clinically indicated.  ABI 12/29/2019:  This exam reveals moderately decreased perfusion of the right lower extremity, noted at  the post tibial artery level (ABI 0.51) and severely decreased perfusion of the left lower extremity, noted at the dorsalis pedis and post tibial artery level (ABI 0.32).  Right AT appears to be occluded with dampened waveform. There is severely abnormal monophasic waveform of all vessels at the level of the ankle.  PCV MYOCARDIAL PERFUSION WO LEXISCAN 12/27/2019 Nondiagnostic ECG stress due to pharmacologic stress. Resting EKG demonstrated normal sinus rhythm. Non-specific T-wave abnormalities. Peak EKG revealed no significant ST-T changes. Myocardial perfusion is normal. Overall LV systolic function is normal without regional wall motion abnormalities. Stress LV EF: 58%. No previous exam available for comparison. Low risk.  Echocardiogram 12/04/2019:  1. Patient in afib during exam.   2. Left ventricular ejection fraction, by estimation, is 60 to 65%. The left ventricle has normal function. The left ventricle has no regional wall motion abnormalities. Left ventricular diastolic parameters are indeterminate.   3. Right ventricular systolic function is normal. The right ventricular size is normal.   4. Left atrial size was mildly dilated.   5. The mitral valve is degenerative. Trivial mitral valve regurgitation. No evidence of mitral stenosis.   6. The aortic valve was not well visualized. Aortic valve regurgitation is not visualized. Mild to moderate aortic valve sclerosis/calcification is present, without any evidence of aortic stenosis.   7. The inferior vena cava is normal in size with greater than 50% respiratory variability, suggesting right atrial pressure of 3 mmHg.  EKG:  07/15/2021: Sinus rhythm at a rate of 56 bpm.  Normal axis.  Low voltage complexes, consider pulmonary disease pattern.  No evidence of ischemia or underlying injury pattern.  01/06/2021: Sinus rhythm at a rate of 55 bpm.  Normal axis.  Left atrial enlargement.  No evidence of ischemia or underlying injury pattern.   Compared to EKG 02/16/2020, no longer bradycardic and no PRWP.   12/25/2019: Sinus bradycardia at rate of 48 bpm. Normal axis. Left atrial enlargement. PRWP, cannot exclude anterolateral infarct old. Non-specific T wave abnormality.   12/04/2019: Atrial fibrillation with rapid ventricular response at a rate of 107 bpm. Normal axis, left atrial. Enlargement.   Assessment     ICD-10-CM   1. Essential hypertension  I10 EKG 12-Lead    2. Paroxysmal atrial fibrillation (HCC)  I48.0     3. Bilateral carotid artery stenosis  I65.23 PCV CAROTID DUPLEX (BILATERAL)     Medications Discontinued During This Encounter  Medication Reason   hydrochlorothiazide (HYDRODIURIL) 25 MG tablet Duplicate  No orders of the defined types were placed in this encounter.  This patients CHA2DS2-VASc Score 4 (HTN, DM, Age, PAD) and yearly risk of stroke 4.8%.  Recommendations:   Jylan Loeza. is a 76 y.o. with history of hypertension, hyperlipidemia, type II diabetes mellitus. Also with history of covid 19 infection 11/2019, and diagnosed with atrial fibrillation 12/03/2019.   Patient presents for 52-monthfollow-up.  He remains active and is feeling well overall.  He has had no recurrence of dizziness.  Continues to tolerate anticoagulation without bleeding diathesis.  Reviewed and discussed results of carotid duplex and lower arterial duplex, details above.  Patient remains asymptomatic from a PAD standpoint, denies claudication and has no wounds or ulcers.  We will continue medical therapy.  We will also repeat carotid artery surveillance in 6 months.  Blood pressure is well controlled.  Follow-up in 6 months, sooner if needed.   CAlethia Berthold PA-C 07/15/2021, 11:41 AM Office: [phone]

## 2021-07-22 ENCOUNTER — Other Ambulatory Visit: Payer: Self-pay | Admitting: Student

## 2021-07-22 DIAGNOSIS — E1169 Type 2 diabetes mellitus with other specified complication: Secondary | ICD-10-CM

## 2021-07-23 ENCOUNTER — Other Ambulatory Visit: Payer: Self-pay

## 2021-07-23 ENCOUNTER — Telehealth: Payer: Self-pay | Admitting: Pharmacist

## 2021-07-23 DIAGNOSIS — E1169 Type 2 diabetes mellitus with other specified complication: Secondary | ICD-10-CM

## 2021-07-23 MED ORDER — HYDRALAZINE HCL 50 MG PO TABS: 50.0000 mg | ORAL_TABLET | Freq: Three times a day (TID) | ORAL | 2 refills | Status: DC

## 2021-07-23 MED ORDER — ROSUVASTATIN CALCIUM 10 MG PO TABS: 10.0000 mg | ORAL_TABLET | Freq: Every day | ORAL | 2 refills | Status: DC

## 2021-07-23 NOTE — Progress Notes (Signed)
Chronic Care Management Pharmacy Assistant    Name: John Woodward.  MRN: [id number] DOB: 08-29-1945  Reason for Encounter: Monthly Medication Coordination Call    Medications: Outpatient Encounter Medications as of 07/23/2021  Medication Sig   amLODipine (NORVASC) 10 MG tablet TAKE ONE TABLET BY MOUTH ONCE DAILY   ELIQUIS 5 MG TABS tablet TAKE ONE TABLET BY MOUTH TWICE DAILY   escitalopram (LEXAPRO) 20 MG tablet Take 1 tablet (20 mg total) by mouth daily.   hydrALAZINE (APRESOLINE) 50 MG tablet Take 1 tablet (50 mg total) by mouth 3 (three) times daily.   hydrochlorothiazide (MICROZIDE) 12.5 MG capsule TAKE ONE CAPSULE BY MOUTH DAILY   metFORMIN (GLUCOPHAGE) 500 MG tablet TAKE ONE TABLET BY MOUTH EVERY MORNING and TAKE TWO TABLETS BY MOUTH EVERY EVENING   nicotine polacrilex (COMMIT) 4 MG lozenge Take 4 mg by mouth as needed for smoking cessation.   rosuvastatin (CRESTOR) 10 MG tablet Take 1 tablet (10 mg total) by mouth daily.   telmisartan (MICARDIS) 80 MG tablet TAKE ONE TABLET BY MOUTH ONCE DAILY   [DISCONTINUED] metoprolol tartrate (LOPRESSOR) 25 MG tablet Take 1 tablet (25 mg total) by mouth 2 (two) times daily.   No facility-administered encounter medications on file as of 07/23/2021.    Reviewed chart for medication changes ahead of medication coordination call.  No OVs, Consults, or hospital visits since last care coordination call/Pharmacist visit. (If appropriate, list visit date, provider name)  No medication changes indicated OR if recent visit, treatment plan here.  BP Readings from Last 3 Encounters:  07/15/21 118/64  06/10/21 128/72  01/06/21 139/72    Lab Results  Component Value Date   HGBA1C 6.2 (A) 06/10/2021     Patient obtains medications through Vials  90 Days    Last adherence delivery included: (medication name and frequency)  Telmisartan 80 mg once daily Amlodipine 10 mg once daily Escitalopram 10 mg once daily Eliquis '5mg'$ - take one  tab twice daily Rosuvastatin '10mg'$ - take one tab daily Metformin 500 mg- one tab every morning and 2 tabs every evening  Hydrochlorothiazide 25 mg - once daily  Hydralazine 50 mg - one tab three times daily     Patient is due for next adherence delivery on: 08/04/21. Called patient and reviewed medications and coordinated delivery.   This delivery to include:  Telmisartan 80 mg once daily Amlodipine 10 mg once daily Escitalopram 10 mg once daily Eliquis '5mg'$ - take one tab twice daily Rosuvastatin '10mg'$ - take one tab daily Metformin 500 mg- one tab every morning and 2 tabs every evening  Hydrochlorothiazide 25 mg - once daily  Hydralazine 50 mg - one tab three times daily      Patient needs refills from PCP for 90 days: Metformin 500 mg From Specialist: Amlodipine 10 mg once daily, Rosuvastatin '10mg'$ - take one tab daily, and Hydralazine 50 mg - one tab three times daily.  Confirmed delivery date of 08/04/21, advised patient that pharmacy will contact them the morning of delivery.   Care Gaps   AWV: done 10/14/20 Colonoscopy: done 09/13/20 DM Eye Exam: due 02/14/21 DM Foot Exam: due 06/04/21 Microalbumin: unknown  HbgAIC: done 06/10/21 (6.2) DEXA: N/A Mammogram: N/A     Star Rating Drugs: Telmisartan (MICARDIS) 80 MG tablet - last filled 05/01/21 90 days  Rosuvastatin (CRESTOR) 10 MG tablet - last filled 05/01/21 90 days  MetFORMIN (GLUCOPHAGE) 500 MG tablet - last filled 05/01/21 90 days    Future Appointments  Date  Time Provider [place]  12/15/2021  8:50 AM Maximiano Coss, NP [facility]  01/12/2022 10:00 AM Rex Kras, DO [facility] None     Jobe Gibbon, [facility] Clinical Pharmacist Assistant  [phone]

## 2021-07-25 ENCOUNTER — Telehealth: Payer: Self-pay

## 2021-07-25 NOTE — Telephone Encounter (Signed)
VM 1110:  Patients wife [place] called and left a message on VM.  Message is as follows:  "yes my name is [place] my husband is John Woodward birth date Jan 06, 1946 Lawerance Cruel is his PA. I was wondering if there's anyway that his medicine can be to where he's not sleeping so much he's just wasting his life in the bed and really doesn't have any good more drive. He's just sleeping a bunch.anyway my number is [phone] thank you."  I spoke with patients wife and she is requesting a call from you, about her husband (patient). She is a little emotional, because she is not used to him being like this, and really just to know if she needs to adjust to any changes and is asking for a "reality check" about his health.

## 2021-07-25 NOTE — Telephone Encounter (Signed)
Addressed patient's wife concerns and advised she follow up with PCP as patient has been stable from a cardiac standpoint.

## 2021-07-28 ENCOUNTER — Other Ambulatory Visit: Payer: Self-pay | Admitting: Registered Nurse

## 2021-07-28 DIAGNOSIS — E1165 Type 2 diabetes mellitus with hyperglycemia: Secondary | ICD-10-CM

## 2021-09-01 ENCOUNTER — Telehealth: Payer: Self-pay | Admitting: Pharmacist

## 2021-09-01 NOTE — Progress Notes (Signed)
    Chronic Care Management Pharmacy Assistant   Name: John Woodward.  MRN: [id number] DOB: [birth year]   Reason for Encounter: PAP for Eliquis    PAP form initiated for Eliquis. Will be mailed to patient to complete patient portion and return to prescribing MD office for final portion to be completed by MD and faxed in for patient for processing. Patient will update on the outcome of their application once received.    Jobe Gibbon, [facility] Clinical Pharmacist Assistant  [phone]

## 2021-09-16 ENCOUNTER — Telehealth: Payer: Self-pay

## 2021-09-16 NOTE — Telephone Encounter (Signed)
Please refer him to his PCP regarding excuse from jury duty.   Dr. Terri Skains

## 2021-09-17 NOTE — Telephone Encounter (Signed)
Called and spoke to patient's wife she voiced understanding

## 2021-10-06 ENCOUNTER — Telehealth: Payer: Self-pay | Admitting: Pharmacist

## 2021-10-06 NOTE — Progress Notes (Signed)
    Chronic Care Management Pharmacy Assistant   Name: John Woodward.  MRN: [id number] DOB: 1945-03-28   Reason for Encounter: Disease State - Hypertension Call     Recent office visits:  None noted  Recent consult visits:  None noted  Hospital visits:  None in previous 6 months   Medications: Outpatient Encounter Medications as of 10/06/2021  Medication Sig   amLODipine (NORVASC) 10 MG tablet TAKE ONE TABLET BY MOUTH ONCE DAILY   ELIQUIS 5 MG TABS tablet TAKE ONE TABLET BY MOUTH TWICE DAILY   escitalopram (LEXAPRO) 20 MG tablet Take 1 tablet (20 mg total) by mouth daily.   hydrALAZINE (APRESOLINE) 50 MG tablet Take 1 tablet (50 mg total) by mouth 3 (three) times daily.   hydrochlorothiazide (MICROZIDE) 12.5 MG capsule TAKE ONE CAPSULE BY MOUTH DAILY   metFORMIN (GLUCOPHAGE) 500 MG tablet TAKE ONE TABLET BY MOUTH EVERY MORNING and TAKE TWO TABLETS BY MOUTH EVERY EVENING   nicotine polacrilex (COMMIT) 4 MG lozenge Take 4 mg by mouth as needed for smoking cessation.   rosuvastatin (CRESTOR) 10 MG tablet Take 1 tablet (10 mg total) by mouth daily.   telmisartan (MICARDIS) 80 MG tablet TAKE ONE TABLET BY MOUTH ONCE DAILY   [DISCONTINUED] metoprolol tartrate (LOPRESSOR) 25 MG tablet Take 1 tablet (25 mg total) by mouth 2 (two) times daily.   No facility-administered encounter medications on file as of 10/06/2021.   Current antihypertensive regimen:  amLODipine (NORVASC) 10 MG tablet hydrochlorothiazide (MICROZIDE) 12.5 MG capsule telmisartan (MICARDIS) 80 MG tablet  How often are you checking your Blood Pressure?  Patient reported checking blood pressures several times a week.  Current home BP readings: 118/64     What recent interventions/DTPs have been made by any provider to improve Blood Pressure control since last CPP Visit:  Patient denied any recent changes in medication regimen since last visit with CPP   Any recent hospitalizations or ED visits since last visit  with CPP? Patient has not had any hospitalizations or ED visits since last visit.    What diet changes have been made to improve Blood Pressure Control?  Patient reported he limits his salt intake in his diet.    What exercise is being done to improve your Blood Pressure Control?   Patient reports he remains active    Adherence Review: Is the patient currently on ACE/ARB medication? Yes Does the patient have >5 day gap between last estimated fill dates? No     Care Gaps   AWV: done 10/14/20 Colonoscopy: done 09/13/20 DM Eye Exam: due 02/14/21 DM Foot Exam: due 06/04/21 Microalbumin: unknown  HbgAIC: done 06/10/21 (6.2) DEXA: N/A Mammogram: N/A     Star Rating Drugs: Telmisartan (MICARDIS) 80 MG tablet - last filled 07/28/21  90 days  Rosuvastatin (CRESTOR) 10 MG tablet - last filled 07/28/21 90 days  MetFORMIN (GLUCOPHAGE) 500 MG tablet - last filled 7/3/3 90 days    Future Appointments  Date Time Provider [place]  12/15/2021  1:40 PM Midge Minium, MD [facility]  01/12/2022 10:00 AM Rex Kras, DO [facility] None    Jobe Gibbon, [facility] Clinical Pharmacist Assistant  [phone]

## 2021-10-22 ENCOUNTER — Telehealth: Payer: Self-pay | Admitting: Pharmacist

## 2021-10-22 NOTE — Progress Notes (Signed)
Chronic Care Management Pharmacy Assistant   Name: John Woodward.  MRN: [id number] DOB: 1945/06/19   Reason for Encounter: Monthly Medication Coordination Call     Medications: Outpatient Encounter Medications as of 10/22/2021  Medication Sig   amLODipine (NORVASC) 10 MG tablet TAKE ONE TABLET BY MOUTH ONCE DAILY   ELIQUIS 5 MG TABS tablet TAKE ONE TABLET BY MOUTH TWICE DAILY   escitalopram (LEXAPRO) 20 MG tablet Take 1 tablet (20 mg total) by mouth daily.   hydrALAZINE (APRESOLINE) 50 MG tablet Take 1 tablet (50 mg total) by mouth 3 (three) times daily.   hydrochlorothiazide (MICROZIDE) 12.5 MG capsule TAKE ONE CAPSULE BY MOUTH DAILY   metFORMIN (GLUCOPHAGE) 500 MG tablet TAKE ONE TABLET BY MOUTH EVERY MORNING and TAKE TWO TABLETS BY MOUTH EVERY EVENING   nicotine polacrilex (COMMIT) 4 MG lozenge Take 4 mg by mouth as needed for smoking cessation.   rosuvastatin (CRESTOR) 10 MG tablet Take 1 tablet (10 mg total) by mouth daily.   telmisartan (MICARDIS) 80 MG tablet TAKE ONE TABLET BY MOUTH ONCE DAILY   [DISCONTINUED] metoprolol tartrate (LOPRESSOR) 25 MG tablet Take 1 tablet (25 mg total) by mouth 2 (two) times daily.   No facility-administered encounter medications on file as of 10/22/2021.    Reviewed chart for medication changes ahead of medication coordination call.  No OVs, Consults, or hospital visits since last care coordination call/Pharmacist visit. (If appropriate, list visit date, provider name)  No medication changes indicated OR if recent visit, treatment plan here.  BP Readings from Last 3 Encounters:  07/15/21 118/64  06/10/21 128/72  01/06/21 139/72    Lab Results  Component Value Date   HGBA1C 6.2 (A) 06/10/2021     Patient obtains medications through Vials  90 Days   Last adherence delivery included: (medication name and frequency)  Telmisartan 80 mg once daily Amlodipine 10 mg once daily Escitalopram 10 mg once daily Eliquis '5mg'$ - take one  tab twice daily Rosuvastatin '10mg'$ - take one tab daily Metformin 500 mg- one tab every morning and 2 tabs every evening  Hydrochlorothiazide 25 mg - once daily  Hydralazine 50 mg - one tab three times daily     Patient is due for next adherence delivery on: 11/03/21. Called patient and reviewed medications and coordinated delivery.   This delivery to include:  Telmisartan 80 mg once daily Amlodipine 10 mg once daily Escitalopram 10 mg once daily Eliquis '5mg'$ - take one tab twice daily Rosuvastatin '10mg'$ - take one tab daily Metformin 500 mg- one tab every morning and 2 tabs every evening  Hydrochlorothiazide 25 mg - once daily  Hydralazine 50 mg - one tab three times daily    Patient needs refills from PCP for 90 days  Metformin 500 mg- one tab every morning and 2 tabs every evening    Confirmed delivery date of 11/03/21, advised patient that pharmacy will contact them the morning of delivery.   Care Gaps   AWV: done 10/14/20 Colonoscopy: done 09/13/20 DM Eye Exam: due 02/14/21 DM Foot Exam: due 06/04/21 Microalbumin: unknown  HbgAIC: done 06/10/21 (6.2) DEXA: N/A Mammogram: N/A     Star Rating Drugs: Telmisartan (MICARDIS) 80 MG tablet - last filled 07/28/21  90 days  Rosuvastatin (CRESTOR) 10 MG tablet - last filled 07/28/21 90 days  MetFORMIN (GLUCOPHAGE) 500 MG tablet - last filled 7/3/3 90 days     Future Appointments  Date Time Provider [place]  12/15/2021  1:40 PM Annye Asa  E, MD [facility]  01/12/2022 10:00 AM Rex Kras, DO [facility] None    Jobe Gibbon, [facility] Clinical Pharmacist Assistant  [phone]

## 2021-10-30 ENCOUNTER — Other Ambulatory Visit: Payer: Self-pay

## 2021-10-30 DIAGNOSIS — E1165 Type 2 diabetes mellitus with hyperglycemia: Secondary | ICD-10-CM

## 2021-10-30 MED ORDER — METFORMIN HCL 500 MG PO TABS: ORAL_TABLET | ORAL | 2 refills | Status: DC

## 2021-10-30 NOTE — Telephone Encounter (Signed)
-----   Message from Edythe Clarity, [facility] sent at 10/30/2021  8:37 AM EDT ----- Patient is needing a one time refill of 90 days (270 tabs) on his metformin while he finds a new provider.  He would like this sent to [facility] for a pending delivery.  Thanks! Beverly Milch, PharmD Clinical Pharmacist  [facility] [phone]

## 2021-12-11 ENCOUNTER — Telehealth: Payer: Self-pay | Admitting: Registered Nurse

## 2021-12-11 NOTE — Telephone Encounter (Signed)
error 

## 2021-12-15 ENCOUNTER — Ambulatory Visit: Payer: Medicare HMO | Admitting: Registered Nurse

## 2021-12-15 ENCOUNTER — Encounter: Payer: Medicare HMO | Admitting: Family Medicine

## 2022-01-08 ENCOUNTER — Ambulatory Visit (INDEPENDENT_AMBULATORY_CARE_PROVIDER_SITE_OTHER): Payer: Medicare HMO | Admitting: Family Medicine

## 2022-01-08 ENCOUNTER — Encounter: Payer: Self-pay | Admitting: Family Medicine

## 2022-01-08 VITALS — BP 126/76 | HR 100 | Temp 97.8°F | Resp 18 | Ht 71.0 in | Wt 149.4 lb

## 2022-01-08 DIAGNOSIS — R69 Illness, unspecified: Secondary | ICD-10-CM | POA: Diagnosis not present

## 2022-01-08 DIAGNOSIS — E119 Type 2 diabetes mellitus without complications: Secondary | ICD-10-CM

## 2022-01-08 DIAGNOSIS — E785 Hyperlipidemia, unspecified: Secondary | ICD-10-CM

## 2022-01-08 DIAGNOSIS — I10 Essential (primary) hypertension: Secondary | ICD-10-CM

## 2022-01-08 DIAGNOSIS — F339 Major depressive disorder, recurrent, unspecified: Secondary | ICD-10-CM

## 2022-01-08 DIAGNOSIS — E1169 Type 2 diabetes mellitus with other specified complication: Secondary | ICD-10-CM

## 2022-01-08 LAB — LIPID PANEL
Cholesterol: 149 mg/dL (ref 0–200)
HDL: 44.1 mg/dL (ref >39.00)
LDL Cholesterol: 69 mg/dL (ref 0–99)
NonHDL: 104.71
Total CHOL/HDL Ratio: 3
Triglycerides: 179 mg/dL — ABNORMAL HIGH (ref 0.0–149.0)
VLDL: 35.8 mg/dL (ref 0.0–40.0)

## 2022-01-08 LAB — HEPATIC FUNCTION PANEL
ALT: 11 U/L (ref 0–53)
AST: 15 U/L (ref 0–37)
Albumin: 4.4 g/dL (ref 3.5–5.2)
Alkaline Phosphatase: 58 U/L (ref 39–117)
Bilirubin, Direct: 0.1 mg/dL (ref 0.0–0.3)
Total Bilirubin: 0.4 mg/dL (ref 0.2–1.2)
Total Protein: 7.2 g/dL (ref 6.0–8.3)

## 2022-01-08 LAB — BASIC METABOLIC PANEL
BUN: 24 mg/dL — ABNORMAL HIGH (ref 6–23)
CO2: 32 mEq/L (ref 19–32)
Calcium: 9.5 mg/dL (ref 8.4–10.5)
Chloride: 103 mEq/L (ref 96–112)
Creatinine, Ser: 1.5 mg/dL (ref 0.40–1.50)
GFR: 45.09 mL/min — ABNORMAL LOW (ref >60.00)
Glucose, Bld: 111 mg/dL — ABNORMAL HIGH (ref 70–99)
Potassium: 5.1 mEq/L (ref 3.5–5.1)
Sodium: 140 mEq/L (ref 135–145)

## 2022-01-08 LAB — CBC WITH DIFFERENTIAL/PLATELET
Basophils Absolute: 0.1 10*3/uL (ref 0.0–0.1)
Basophils Relative: 1.1 % (ref 0.0–3.0)
Eosinophils Absolute: 0.5 10*3/uL (ref 0.0–0.7)
Eosinophils Relative: 8 % — ABNORMAL HIGH (ref 0.0–5.0)
HCT: 42.5 % (ref 39.0–52.0)
Hemoglobin: 14.5 g/dL (ref 13.0–17.0)
Lymphocytes Relative: 23 % (ref 12.0–46.0)
Lymphs Abs: 1.3 10*3/uL (ref 0.7–4.0)
MCHC: 34.2 g/dL (ref 30.0–36.0)
MCV: 96.2 fl (ref 78.0–100.0)
Monocytes Absolute: 0.5 10*3/uL (ref 0.1–1.0)
Monocytes Relative: 8 % (ref 3.0–12.0)
Neutro Abs: 3.5 10*3/uL (ref 1.4–7.7)
Neutrophils Relative %: 59.9 % (ref 43.0–77.0)
Platelets: 258 10*3/uL (ref 150.0–400.0)
RBC: 4.41 Mil/uL (ref 4.22–5.81)
RDW: 13.7 % (ref 11.5–15.5)
WBC: 5.8 10*3/uL (ref 4.0–10.5)

## 2022-01-08 LAB — MICROALBUMIN / CREATININE URINE RATIO
Creatinine,U: 122.2 mg/dL
Microalb Creat Ratio: 13.3 mg/g (ref 0.0–30.0)
Microalb, Ur: 16.2 mg/dL — ABNORMAL HIGH (ref 0.0–1.9)

## 2022-01-08 LAB — TSH: TSH: 1.4 u[IU]/mL (ref 0.35–5.50)

## 2022-01-08 LAB — HEMOGLOBIN A1C: Hgb A1c MFr Bld: 6.5 % (ref 4.6–6.5)

## 2022-01-08 NOTE — Assessment & Plan Note (Signed)
Deteriorated.  Pt is currently on Lexapro '20mg'$  daily but is struggling since he lost his daughter last month.  He states that his wife and other 3 children have been very helpful and supportive but he has not talked to a professional.  He is not interested in grief counseling at this time but knows we can revisit this if he changes his mind.  Will follow.

## 2022-01-08 NOTE — Progress Notes (Signed)
   Subjective:    Patient ID: John Heman., male    DOB: 1945/11/03, 76 y.o.   MRN: [id number]  HPI DM- chronic problem.  On Metformin '500mg'$  daily.  UTD on eye exam, due for foot exam, microalbumin.  They eliminated the evening dose due to diarrhea.  Denies symptomatic lows.  Denies numbness/tingling of hands or feet.  HTN- chronic problem, on Amlodipine '10mg'$  daily, Hydralazine '50mg'$  TID, HCTZ 12.'5mg'$  daily, and Telmisartan '80mg'$  daily w/ good control.  No CP, SOB above baseline, HA's, visual changes, edema.  Hyperlipidemia- chronic problem, on Crestor '10mg'$  daily.  No abd pain, N/V.  'i've been kinda depressed'- pt lost oldest daughter in mid-November.  'she drank herself to death'.  Has not done counseling since her death.  Currently on Lexapro '20mg'$  daily.  His 3 other children 'have been a great help'.     Review of Systems For ROS see HPI     Objective:   Physical Exam Vitals reviewed.  Constitutional:      General: He is not in acute distress.    Appearance: Normal appearance. He is well-developed. He is not ill-appearing.     Comments: Smells of cigarettes  HENT:     Head: Normocephalic and atraumatic.  Eyes:     Extraocular Movements: Extraocular movements intact.     Conjunctiva/sclera: Conjunctivae normal.     Pupils: Pupils are equal, round, and reactive to light.  Neck:     Thyroid: No thyromegaly.  Cardiovascular:     Rate and Rhythm: Normal rate and regular rhythm.     Pulses: Normal pulses.     Heart sounds: Normal heart sounds. No murmur heard. Pulmonary:     Effort: Pulmonary effort is normal. No respiratory distress.     Breath sounds: Normal breath sounds.  Abdominal:     General: Bowel sounds are normal. There is no distension.     Palpations: Abdomen is soft.  Musculoskeletal:     Cervical back: Normal range of motion and neck supple.     Right lower leg: No edema.     Left lower leg: No edema.  Lymphadenopathy:     Cervical: No cervical  adenopathy.  Skin:    General: Skin is warm and dry.  Neurological:     General: No focal deficit present.     Mental Status: He is alert and oriented to person, place, and time.     Cranial Nerves: No cranial nerve deficit.  Psychiatric:        Mood and Affect: Mood normal.        Behavior: Behavior normal.           Assessment & Plan:

## 2022-01-08 NOTE — Assessment & Plan Note (Signed)
Chronic problem.  Currently on Metformin '500mg'$  daily (down from '1500mg'$  daily due to diarrhea).  UTD on eye exam, foot exam, microalbumin.  Currently asymptomatic.  Check labs.  Adjust meds prn

## 2022-01-08 NOTE — Assessment & Plan Note (Signed)
Chronic problem.  On Crestor 10mg daily w/o difficulty.  Check labs.  Adjust meds prn  

## 2022-01-08 NOTE — Patient Instructions (Signed)
Schedule your complete physical in 6 months We'll notify you of your lab results and make any changes if needed Continue to work on low carb, low sugar diet Try and quit smoking!!! If you feel stuck in how you're feeling, please let me know Call with any questions or concerns I'm so sorry for your loss.

## 2022-01-08 NOTE — Assessment & Plan Note (Signed)
Chronic problem.  Currently well controlled on Amlodipine '10mg'$  daily, Hydralazine '50mg'$  TID, HCTZ 12.'5mg'$  daily, and Telmisartan '80mg'$  daily.  Currently asymptomatic.  Check labs due to ARB and diuretic use but no anticipated med changes.  Will follow.

## 2022-01-09 ENCOUNTER — Telehealth: Payer: Self-pay

## 2022-01-09 ENCOUNTER — Other Ambulatory Visit: Payer: Self-pay

## 2022-01-09 DIAGNOSIS — R748 Abnormal levels of other serum enzymes: Secondary | ICD-10-CM

## 2022-01-09 NOTE — Telephone Encounter (Signed)
Informed pt of lab results and repeat BMP order is in and lab apt is made for 01/22/22

## 2022-01-12 ENCOUNTER — Ambulatory Visit: Payer: Medicare HMO | Admitting: Cardiology

## 2022-01-12 ENCOUNTER — Encounter: Payer: Self-pay | Admitting: Cardiology

## 2022-01-12 VITALS — BP 124/70 | HR 67 | Resp 16 | Ht 71.0 in | Wt 153.0 lb

## 2022-01-12 DIAGNOSIS — Z7901 Long term (current) use of anticoagulants: Secondary | ICD-10-CM | POA: Diagnosis not present

## 2022-01-12 DIAGNOSIS — R69 Illness, unspecified: Secondary | ICD-10-CM | POA: Diagnosis not present

## 2022-01-12 DIAGNOSIS — I480 Paroxysmal atrial fibrillation: Secondary | ICD-10-CM | POA: Diagnosis not present

## 2022-01-12 DIAGNOSIS — I739 Peripheral vascular disease, unspecified: Secondary | ICD-10-CM

## 2022-01-12 DIAGNOSIS — I6523 Occlusion and stenosis of bilateral carotid arteries: Secondary | ICD-10-CM

## 2022-01-12 DIAGNOSIS — F17210 Nicotine dependence, cigarettes, uncomplicated: Secondary | ICD-10-CM

## 2022-01-12 DIAGNOSIS — I10 Essential (primary) hypertension: Secondary | ICD-10-CM | POA: Diagnosis not present

## 2022-01-12 DIAGNOSIS — E119 Type 2 diabetes mellitus without complications: Secondary | ICD-10-CM | POA: Diagnosis not present

## 2022-01-12 MED ORDER — NICOTINE 7 MG/24HR TD PT24: 7.0000 mg | MEDICATED_PATCH | Freq: Every day | TRANSDERMAL | 0 refills | Status: DC

## 2022-01-12 NOTE — Progress Notes (Signed)
Primary Physician/Referring:  Midge Minium, MD  Patient ID: John Heman., male    DOB: [birth year], 76 y.o.   MRN: [id number]  Chief Complaint  Patient presents with   Follow-up    6 month   Atrial Fibrillation   HPI:    John Gaspard.  is a 76 y.o. with history of COPD, hypertension, hyperlipidemia, type II diabetes mellitus non-insulin dependent, paroxysmal atrial fibrillation, history of COVID-19 infection, peripheral artery disease, smoking.    Patient presents for 61-monthfollow-up for atrial fibrillation, carotid disease, PAD.  He is accompanied by his wife John to be called John Woodward) at today's office visit.  He was last in the office in July 2023 by John Cruel PA at that time he was recommended to undergo a repeat carotid duplex in 6 months to reevaluate progression of carotid artery disease.  However, the duplex is still pending.  Clinically he is asymptomatic.  With regards to atrial fibrillation patient states that he does not recognize when he goes in and out of A-fib.  EKG today shows sinus rhythm.  He is not on AV nodal blocking agents and is tolerating oral anticoagulation without any side effects/intolerances or evidence of bleeding.  Patient continues to smoke at least 5 cigarettes/day or more.  Despite multiple cardiovascular risk factors including peripheral artery disease.  Patient was noted to have abnormal ankle-brachial index and arterial duplex illustrates bilateral PAD.  Clinically he denies claudication.  I suspect that his symptoms may be propagated with increased physical activity.  We discussed additional testing for PAD such as abdominal aortic runoff versus CT angiography; however, his wife states " I do not want you to have anything invasive."  Past Medical History:  Diagnosis Date   Arthritis    [place]    Closed fracture dislocation of right elbow    30 years ago -- fell out of theback of a truck    Di[place] mellitus [place]    Hx of colonic polyps 02/23/2017   Hyperlipidemia    Hypertension    Diagnosed 10 years ago. No on medication. Denies history of CAD.    Memory loss    PAD (peripheral artery disease) ([facility]    Past Surgical History:  Procedure Laterality Date   OLECRANON BURSECTOMY Right 08/18/2016   Procedure: OLECRANON BURSECTOMY;  Surgeon: DMeredith Pel MD;  [facility]  Service: Orthopedics;  Laterality: Right;   Family History  Problem Relation Age of Onset   Hypertension Mother    Dementia Mother        881s  Transient ischemic attack Mother        884s  Kidney disease Father    COPD Father    Hypertension Brother    Lung cancer Maternal Aunt    Alcohol abuse Maternal Grandfather    Mental illness Paternal Grandmother    Hypertension Daughter    Healthy Son    Learning disabilities Son    Colon cancer Neg Hx    Colon polyps Neg Hx    Esophageal cancer Neg Hx    Rectal cancer Neg Hx    Stomach cancer Neg Hx     Social History   Tobacco Use   Smoking status: Every Day    Packs/day: 0.25    Years: 50.00    Total pack years: 12.50    Types: Cigarettes    Start date: 08/12/1966   Smokeless tobacco: Never  Tobacco comments:    Taking Nicotine lozenges  Substance Use Topics   Alcohol use: No   Marital Status: Married   ROS  Review of Systems  Cardiovascular:  Negative for chest pain, claudication, dyspnea on exertion, leg swelling, near-syncope, orthopnea, palpitations, paroxysmal nocturnal dyspnea and syncope.  Hematologic/Lymphatic:       No bleeding   Gastrointestinal:  Negative for melena.  Neurological:  Negative for dizziness.   Objective  Blood pressure 124/70, pulse 67, resp. rate 16, height '5\' 11"'$  (1.803 m), weight 153 lb (69.4 kg), SpO2 91 %.     01/12/2022   10:06 AM 01/08/2022   12:46 PM 07/15/2021    9:56 AM  Vitals with BMI  Height '5\' 11"'$  '5\' 11"'$  '5\' 11"'$   Weight 153 lbs 149 lbs 6 oz 148 lbs 6 oz   BMI 21.35 19.41 74.08  Systolic 144 818 563  Diastolic 70 76 64  Pulse 67 100 73     Physical Exam Vitals reviewed.  Cardiovascular:     Rate and Rhythm: Normal rate and regular rhythm.     Pulses: No decreased pulses.          Carotid pulses are 2+ on the right side with bruit and 2+ on the left side.      Radial pulses are 2+ on the right side and 2+ on the left side.       Femoral pulses are 2+ on the right side and 1+ on the left side.      Popliteal pulses are 1+ on the right side and 1+ on the left side.       Dorsalis pedis pulses are 0 on the right side and 0 on the left side.       Posterior tibial pulses are 0 on the right side and 0 on the left side.     Heart sounds: S1 normal and S2 normal. No murmur heard.    No gallop.     Comments: Poor nail hygiene, no cyanosis or gangrene noted. Pulmonary:     Effort: Pulmonary effort is normal. No respiratory distress.     Breath sounds: No wheezing, rhonchi or rales.     Comments: Decreased breath sounds bilaterally.  Kyphosis of the thoracic spine Musculoskeletal:     Right lower leg: No edema.     Left lower leg: No edema.  Neurological:     Mental Status: He is alert.    Laboratory examination:   Recent Labs    06/10/21 0946 01/08/22 1315  NA 138 140  K 4.2 5.1  CL 101 103  CO2 27 32  GLUCOSE 103* 111*  BUN 28* 24*  CREATININE 1.49 1.50  CALCIUM 9.5 9.5   estimated creatinine clearance is 41.1 mL/min (by C-G formula based on SCr of 1.5 mg/dL).     Latest Ref Rng & Units 01/08/2022    1:15 PM 06/10/2021    9:46 AM 12/05/2020   11:30 AM  CMP  Glucose 70 - 99 mg/dL 111  103  90   BUN 6 - 23 mg/dL '24  28  29   '$ Creatinine 0.40 - 1.50 mg/dL 1.50  1.49  1.30   Sodium 135 - 145 mEq/L 140  138  139   Potassium 3.5 - 5.1 mEq/L 5.1  4.2  4.2   Chloride 96 - 112 mEq/L 103  101  102   CO2 19 - 32 mEq/L 32  27  28   Calcium 8.4 -  10.5 mg/dL 9.5  9.5  9.5   Total Protein 6.0 - 8.3 g/dL 7.2  7.4  7.3   Total  Bilirubin 0.2 - 1.2 mg/dL 0.4  0.5  0.4   Alkaline Phos 39 - 117 U/L 58  64  61   AST 0 - 37 U/L '15  14  17   '$ ALT 0 - 53 U/L '11  13  13       '$ Latest Ref Rng & Units 01/08/2022    1:15 PM 06/10/2021    9:46 AM 12/05/2020   11:30 AM  CBC  WBC 4.0 - 10.5 K/uL 5.8  4.9  4.9   Hemoglobin 13.0 - 17.0 g/dL 14.5  14.9  14.9   Hematocrit 39.0 - 52.0 % 42.5  44.4  44.5   Platelets 150.0 - 400.0 K/uL 258.0  260.0  233.0     Lipid Panel Recent Labs    06/10/21 0946 01/08/22 1315  CHOL 170 149  TRIG 243.0* 179.0*  LDLCALC  --  69  VLDL 48.6* 35.8  HDL 41.30 44.10  CHOLHDL 4 3  LDLDIRECT 88.0  --     HEMOGLOBIN A1C Lab Results  Component Value Date   HGBA1C 6.5 01/08/2022   MPG 131.24 12/03/2019   TSH Recent Labs    06/10/21 0946 01/08/22 1315  TSH 2.14 1.40    External labs:  None   Allergies   Allergies  Allergen Reactions   Antibacterial Hand Soap [Triclosan] Other (See Comments)    Caused water blisters   Medications Prior to Visit:   Outpatient Medications Prior to Visit  Medication Sig Dispense Refill   amLODipine (NORVASC) 10 MG tablet TAKE ONE TABLET BY MOUTH ONCE DAILY 90 tablet 1   ELIQUIS 5 MG TABS tablet TAKE ONE TABLET BY MOUTH TWICE DAILY 180 tablet 1   escitalopram (LEXAPRO) 20 MG tablet Take 1 tablet (20 mg total) by mouth daily. 90 tablet 3   hydrALAZINE (APRESOLINE) 50 MG tablet Take 1 tablet (50 mg total) by mouth 3 (three) times daily. 270 tablet 2   hydrochlorothiazide (MICROZIDE) 12.5 MG capsule TAKE ONE CAPSULE BY MOUTH DAILY 90 capsule 3   metFORMIN (GLUCOPHAGE) 500 MG tablet Take one tablet every morning and take two tablets by mouth every evening 270 tablet 2   nicotine polacrilex (COMMIT) 4 MG lozenge Take 4 mg by mouth as needed for smoking cessation.     rosuvastatin (CRESTOR) 10 MG tablet Take 1 tablet (10 mg total) by mouth daily. 90 tablet 2   telmisartan (MICARDIS) 80 MG tablet TAKE ONE TABLET BY MOUTH ONCE DAILY 90 tablet 3   No  facility-administered medications prior to visit.   Final Medications at End of Visit    Current Meds  Medication Sig   amLODipine (NORVASC) 10 MG tablet TAKE ONE TABLET BY MOUTH ONCE DAILY   ELIQUIS 5 MG TABS tablet TAKE ONE TABLET BY MOUTH TWICE DAILY   escitalopram (LEXAPRO) 20 MG tablet Take 1 tablet (20 mg total) by mouth daily.   hydrALAZINE (APRESOLINE) 50 MG tablet Take 1 tablet (50 mg total) by mouth 3 (three) times daily.   hydrochlorothiazide (MICROZIDE) 12.5 MG capsule TAKE ONE CAPSULE BY MOUTH DAILY   metFORMIN (GLUCOPHAGE) 500 MG tablet Take one tablet every morning and take two tablets by mouth every evening   nicotine polacrilex (COMMIT) 4 MG lozenge Take 4 mg by mouth as needed for smoking cessation.   rosuvastatin (CRESTOR) 10 MG tablet Take 1 tablet (  10 mg total) by mouth daily.   telmisartan (MICARDIS) 80 MG tablet TAKE ONE TABLET BY MOUTH ONCE DAILY   [DISCONTINUED] metoprolol tartrate (LOPRESSOR) 25 MG tablet Take 1 tablet (25 mg total) by mouth 2 (two) times daily.   nicotine (NICODERM CQ - DOSED IN MG/24 HR) 7 mg/24hr patch Place 1 patch (7 mg total) onto the skin daily.   Radiology:   No results found.  Cardiac Studies:  Lower Extremity Arterial Duplex 07/08/2021:  There is severe diffuse calcific plaque through out the bilateral lower  extremity.  Moderate velocity increase at the right mid superficial femoral artery  suggests hemodynamically significant stenosis of  >50% with monophasic waveform below the stenosis.  Dampened monophasic waveform in the left EIA and CFA suggests  hemodynamically significant stenosis in the proximal vessel (CIA).  The left SFA is occluded in the proximal to mid segment with collateral  reconstitution into the distal SFA.  This exam reveals mildly decreased perfusion of the right lower extremity,  noted at the post tibial artery level (ABI 0.8) with  severely abnormal monophasic waveform at the right ankle.  This exam  reveals moderately decreased perfusion of the left lower  Extremity  Carotid artery duplex 07/08/2021:  Duplex suggests stenosis in the right internal carotid artery (50-69%).  Duplex suggests stenosis in the left internal carotid artery (50-69%).  Duplex suggests stenosis in the left external carotid artery (<50%).  Antegrade right vertebral artery flow. Antegrade left vertebral artery  flow with resistant flow and may suggest distal stenosis.  No significant change since 12/06/2020. Follow up in six months is  appropriate if clinically indicated.  ABI 12/29/2019:  This exam reveals moderately decreased perfusion of the right lower extremity, noted at the post tibial artery level (ABI 0.51) and severely decreased perfusion of the left lower extremity, noted at the dorsalis pedis and post tibial artery level (ABI 0.32).  Right AT appears to be occluded with dampened waveform. There is severely abnormal monophasic waveform of all vessels at the level of the ankle.  PCV MYOCARDIAL PERFUSION WO LEXISCAN 12/27/2019 Nondiagnostic ECG stress due to pharmacologic stress. Resting EKG demonstrated normal sinus rhythm. Non-specific T-wave abnormalities. Peak EKG revealed no significant ST-T changes. Myocardial perfusion is normal. Overall LV systolic function is normal without regional wall motion abnormalities. Stress LV EF: 58%. No previous exam available for comparison. Low risk.  Echocardiogram 12/04/2019:  1. Patient in afib during exam.   2. Left ventricular ejection fraction, by estimation, is 60 to 65%. The left ventricle has normal function. The left ventricle has no regional wall motion abnormalities. Left ventricular diastolic parameters are indeterminate.   3. Right ventricular systolic function is normal. The right ventricular size is normal.   4. Left atrial size was mildly dilated.   5. The mitral valve is degenerative. Trivial mitral valve regurgitation. No evidence of mitral  stenosis.   6. The aortic valve was not well visualized. Aortic valve regurgitation is not visualized. Mild to moderate aortic valve sclerosis/calcification is present, without any evidence of aortic stenosis.   7. The inferior vena cava is normal in size with greater than 50% respiratory variability, suggesting right atrial pressure of 3 mmHg.  EKG:  12/04/2019: Atrial fibrillation with rapid ventricular response at a rate of 107 bpm. Normal axis, left atrial. Enlargement.   01/12/2022: Sinus bradycardia, 53 bpm, low voltage, without underlying injury pattern.  Assessment     ICD-10-CM   1. Bilateral carotid artery stenosis  I65.23 VAS US  CAROTID    2. Essential hypertension  I10     3. Paroxysmal atrial fibrillation (HCC)  I48.0 EKG 12-Lead    4. Long term (current) use of anticoagulants  Z79.01     5. Peripheral arterial disease (HCC)  I73.9     6. Tobacco abuse disorder  Z72.0 nicotine (NICODERM CQ - DOSED IN MG/24 HR) 7 mg/24hr patch     There are no discontinued medications.   Meds ordered this encounter  Medications   nicotine (NICODERM CQ - DOSED IN MG/24 HR) 7 mg/24hr patch    Sig: Place 1 patch (7 mg total) onto the skin daily.    Dispense:  28 patch    Refill:  0   This patients CHA2DS2-VASc Score 5 (HTN, DM, Age, PAD) and yearly risk of stroke 7.2%.  Recommendations:   John Ruben. is a 76 y.o. with history of COPD, hypertension, hyperlipidemia, type II diabetes mellitus non-insulin dependent, paroxysmal atrial fibrillation, history of COVID-19 infection, peripheral artery disease, smoking.    Bilateral carotid artery stenosis Asymptomatic. Last duplex June 2023 which noted bilateral internal carotid artery stenosis less than 70%. Will order repeat duplex to reevaluate disease progression Educated him on the importance of complete smoking cessation Continue statin therapy.  Essential hypertension Office blood pressures are  well-controlled. Medications reconciled. No changes warranted at this time.  Paroxysmal atrial fibrillation [facility]) Diagnosed November 2021. Rate control: NA ventricular rate well-controlled. Rhythm control: N/A Thromboembolic prophylaxis: Eliquis Patient does not endorse any evidence of bleeding.  Long term (current) use of anticoagulants Indication: Paroxysmal atrial fibrillation Currently on Eliquis. Most recent hemoglobin hematocrit from December 2023 stable Estimated Creatinine Clearance: 41.1 mL/min (by C-G formula based on SCr of 1.5 mg/dL). Serum creatinine: 1.5 mg/dL 01/08/22 1315 Estimated creatinine clearance: 41.1 mL/min Patient does not endorse any obvious bleeding. Also reviewed with the patient the risks, benefits, and alternatives of oral anticoagulation.  Peripheral arterial disease (HCC) Asymptomatic. No cyanosis or gangrene present.  Weak peripheral pulses as noted above Both ankle-brachial index and lower extremity arterial duplex consistent with PAD. Educated him on importance of complete smoking cessation.  Shared decision was to start nicotine patches and use lozenges as needed.  He would like to hold off on abdominal aortic duplex or CT angiography to evaluate for obstructive PAD. I have asked her to increase physical activity as tolerated with a goal of 30 minutes a day 5 days a week.  Continue statin therapy  Non-insulin-dependent diabetes mellitus type 2:: Recommended that he follows up with podiatry.  Educated him on the importance of glycemic control. Currently on ARB, statin therapy, metformin. Consider the initiation of Jardiance or Wilder Glade will defer management to PCP  Tobacco abuse disorder Tobacco cessation counseling: Currently smoking 5 cigarettes/day Patient was informed of the dangers of tobacco abuse including stroke, cancer, and MI, as well as benefits of tobacco cessation. Patient is willing to quit at this time. 7 mins were spent  counseling patient cessation techniques. We discussed various methods to help quit smoking, including deciding on a date to quit, joining a support group, pharmacological agents- nicotine gum/patch/lozenges.  Will prescribe nicotine patches.  He already has nicotine lozenges to use on appearing basis I will reassess his progress at the next follow-up visit

## 2022-01-21 ENCOUNTER — Telehealth: Payer: Self-pay | Admitting: Pharmacist

## 2022-01-21 NOTE — Progress Notes (Signed)
Chronic Care Management Pharmacy Assistant   Name: John Woodward.  MRN: [id number] DOB: 04-Aug-1945   Reason for Encounter: Monthly Medication Coordination Call      Medications: Outpatient Encounter Medications as of 01/21/2022  Medication Sig   amLODipine (NORVASC) 10 MG tablet TAKE ONE TABLET BY MOUTH ONCE DAILY   ELIQUIS 5 MG TABS tablet TAKE ONE TABLET BY MOUTH TWICE DAILY   escitalopram (LEXAPRO) 20 MG tablet Take 1 tablet (20 mg total) by mouth daily.   hydrALAZINE (APRESOLINE) 50 MG tablet Take 1 tablet (50 mg total) by mouth 3 (three) times daily.   hydrochlorothiazide (MICROZIDE) 12.5 MG capsule TAKE ONE CAPSULE BY MOUTH DAILY   metFORMIN (GLUCOPHAGE) 500 MG tablet Take one tablet every morning and take two tablets by mouth every evening   nicotine (NICODERM CQ - DOSED IN MG/24 HR) 7 mg/24hr patch Place 1 patch (7 mg total) onto the skin daily.   nicotine polacrilex (COMMIT) 4 MG lozenge Take 4 mg by mouth as needed for smoking cessation.   rosuvastatin (CRESTOR) 10 MG tablet Take 1 tablet (10 mg total) by mouth daily.   telmisartan (MICARDIS) 80 MG tablet TAKE ONE TABLET BY MOUTH ONCE DAILY   [DISCONTINUED] metoprolol tartrate (LOPRESSOR) 25 MG tablet Take 1 tablet (25 mg total) by mouth 2 (two) times daily.   No facility-administered encounter medications on file as of 01/21/2022.    Reviewed chart for medication changes ahead of medication coordination call.  No OVs, Consults, or hospital visits since last care coordination call/Pharmacist visit. (If appropriate, list visit date, provider name)  No medication changes indicated OR if recent visit, treatment plan here.  BP Readings from Last 3 Encounters:  01/12/22 124/70  01/08/22 126/76  07/15/21 118/64    Lab Results  Component Value Date   HGBA1C 6.5 01/08/2022     Patient obtains medications through Vials  90 Days   Last adherence delivery included: (medication name and frequency)  Telmisartan  80 mg once daily Amlodipine 10 mg once daily Escitalopram 10 mg once daily Eliquis '5mg'$ - take one tab twice daily Rosuvastatin '10mg'$ - take one tab daily Metformin 500 mg- one tab every morning and 2 tabs every evening  Hydrochlorothiazide 12.5 mg - once daily  Hydralazine 50 mg - one tab three times daily    Patient is due for next adherence delivery on: 01/30/22. Called patient and reviewed medications and coordinated delivery.   This delivery to include:  Patient has [facility] and will no longer in be in network with [facility] as of Jan 2024.  Confirmed delivery date of , advised patient that pharmacy will contact them the morning of delivery.   Care Gaps   AWV: done 10/14/20 Colonoscopy: done 09/13/20 DM Eye Exam: due 02/14/21 DM Foot Exam: due 06/04/21 Microalbumin: unknown  HbgAIC: done 12/29/21 (6.5) DEXA: N/A Mammogram: N/A     Star Rating Drugs: Telmisartan (MICARDIS) 80 MG tablet - last filled 10/29/21  90 days  Rosuvastatin (CRESTOR) 10 MG tablet - last filled 10/29/21 90 days  MetFORMIN (GLUCOPHAGE) 500 MG tablet - last filled 10/29/21 90 days   Future Appointments  Date Time Provider [place]  01/22/2022 10:15 AM [facility]  02/02/2022 10:00 AM [facility] US 1 - OUTPATIENT ONLY [facility]  02/19/2022 10:00 AM [place], Nena Alexander, DPM [facility]  04/16/2022 10:45 AM Rex Kras, DO [facility] None  07/10/2022 10:20 AM Midge Minium, MD [facility] [place],  [place] Clinical Pharmacist Assistant  [phone]

## 2022-01-22 ENCOUNTER — Other Ambulatory Visit (INDEPENDENT_AMBULATORY_CARE_PROVIDER_SITE_OTHER): Payer: Medicare HMO

## 2022-01-22 ENCOUNTER — Other Ambulatory Visit: Payer: Self-pay

## 2022-01-22 DIAGNOSIS — R748 Abnormal levels of other serum enzymes: Secondary | ICD-10-CM

## 2022-01-22 DIAGNOSIS — F321 Major depressive disorder, single episode, moderate: Secondary | ICD-10-CM

## 2022-01-22 LAB — BASIC METABOLIC PANEL
BUN: 23 mg/dL (ref 6–23)
CO2: 32 mEq/L (ref 19–32)
Calcium: 9.9 mg/dL (ref 8.4–10.5)
Chloride: 99 mEq/L (ref 96–112)
Creatinine, Ser: 1.61 mg/dL — ABNORMAL HIGH (ref 0.40–1.50)
GFR: 41.41 mL/min — ABNORMAL LOW (ref >60.00)
Glucose, Bld: 118 mg/dL — ABNORMAL HIGH (ref 70–99)
Potassium: 5.2 mEq/L — ABNORMAL HIGH (ref 3.5–5.1)
Sodium: 138 mEq/L (ref 135–145)

## 2022-01-22 MED ORDER — ESCITALOPRAM OXALATE 20 MG PO TABS: 20.0000 mg | ORAL_TABLET | Freq: Every day | ORAL | 3 refills | Status: DC

## 2022-01-23 ENCOUNTER — Other Ambulatory Visit: Payer: Self-pay | Admitting: Family Medicine

## 2022-01-23 DIAGNOSIS — E875 Hyperkalemia: Secondary | ICD-10-CM

## 2022-01-23 DIAGNOSIS — R7989 Other specified abnormal findings of blood chemistry: Secondary | ICD-10-CM

## 2022-01-23 NOTE — Progress Notes (Signed)
See lab notes

## 2022-01-27 ENCOUNTER — Other Ambulatory Visit (INDEPENDENT_AMBULATORY_CARE_PROVIDER_SITE_OTHER): Payer: Medicare HMO

## 2022-01-27 ENCOUNTER — Telehealth: Payer: Self-pay

## 2022-01-27 DIAGNOSIS — R7989 Other specified abnormal findings of blood chemistry: Secondary | ICD-10-CM

## 2022-01-27 DIAGNOSIS — E875 Hyperkalemia: Secondary | ICD-10-CM

## 2022-01-27 LAB — BASIC METABOLIC PANEL
BUN: 28 mg/dL — ABNORMAL HIGH (ref 6–23)
CO2: 31 mEq/L (ref 19–32)
Calcium: 9.5 mg/dL (ref 8.4–10.5)
Chloride: 101 mEq/L (ref 96–112)
Creatinine, Ser: 1.43 mg/dL (ref 0.40–1.50)
GFR: 47.74 mL/min — ABNORMAL LOW (ref >60.00)
Glucose, Bld: 96 mg/dL (ref 70–99)
Potassium: 4.6 mEq/L (ref 3.5–5.1)
Sodium: 139 mEq/L (ref 135–145)

## 2022-01-27 NOTE — Telephone Encounter (Signed)
-----   Message from Wendie Agreste, MD sent at 01/23/2022  6:29 PM EST ----- Results sent by MyChart, but needs lab visit on Tuesday.  Please schedule.

## 2022-01-27 NOTE — Telephone Encounter (Signed)
Informed pt of the lab results and repeat BMP is in . We will make a lab only visit

## 2022-01-30 ENCOUNTER — Other Ambulatory Visit: Payer: Self-pay

## 2022-01-30 ENCOUNTER — Other Ambulatory Visit: Payer: Self-pay | Admitting: Cardiology

## 2022-01-30 DIAGNOSIS — F17210 Nicotine dependence, cigarettes, uncomplicated: Secondary | ICD-10-CM

## 2022-01-30 MED ORDER — AMLODIPINE BESYLATE 10 MG PO TABS: 10.0000 mg | ORAL_TABLET | Freq: Every day | ORAL | 1 refills | Status: DC

## 2022-02-02 ENCOUNTER — Ambulatory Visit (HOSPITAL_COMMUNITY)
Admission: RE | Admit: 2022-02-02 | Discharge: 2022-02-02 | Disposition: A | Discharge status: Home / Self Care | Payer: Medicare HMO | Source: Ambulatory Visit | Attending: Cardiology | Admitting: Cardiology

## 2022-02-02 DIAGNOSIS — I6523 Occlusion and stenosis of bilateral carotid arteries: Secondary | ICD-10-CM | POA: Insufficient documentation

## 2022-02-02 NOTE — Progress Notes (Signed)
VASCULAR LAB    Carotid duplex has been performed.  See CV proc for preliminary results.   Natasia Sanko, RVT 02/02/2022, 10:39 AM

## 2022-02-09 ENCOUNTER — Other Ambulatory Visit: Payer: Self-pay

## 2022-02-09 ENCOUNTER — Telehealth: Payer: Self-pay | Admitting: Family Medicine

## 2022-02-09 DIAGNOSIS — E1169 Type 2 diabetes mellitus with other specified complication: Secondary | ICD-10-CM

## 2022-02-09 DIAGNOSIS — E875 Hyperkalemia: Secondary | ICD-10-CM

## 2022-02-09 NOTE — Telephone Encounter (Signed)
Caller name: Gara Kroner   On [place]?: Yes  Call back number: [phone]  Provider they see: Midge Minium, MD  Reason for call: Mrs. Talent called stating that her husband needs labs done 48 hours prior to his appt. Mr. Pickerill appt with [facility] is 02/13/22 at 11:00. Dr. Terri Skains are requesting labs ( lipid LDL, CMET and LDL Cholesterol).  Scheduled lab appt for tomorrow at 9:15 am.

## 2022-02-09 NOTE — Progress Notes (Signed)
Spoke with patient, gave results. Acknowledged understanding, ordered and released labs. Moved appt up.

## 2022-02-09 NOTE — Telephone Encounter (Signed)
CMP and Lipid orders have been placed

## 2022-02-10 ENCOUNTER — Other Ambulatory Visit: Payer: Medicare HMO

## 2022-02-10 DIAGNOSIS — E1169 Type 2 diabetes mellitus with other specified complication: Secondary | ICD-10-CM | POA: Diagnosis not present

## 2022-02-10 DIAGNOSIS — E785 Hyperlipidemia, unspecified: Secondary | ICD-10-CM | POA: Diagnosis not present

## 2022-02-11 LAB — COMPREHENSIVE METABOLIC PANEL
ALT: 13 IU/L (ref 0–44)
AST: 16 IU/L (ref 0–40)
Albumin/Globulin Ratio: 1.7 (ref 1.2–2.2)
Albumin: 4.1 g/dL (ref 3.8–4.8)
Alkaline Phosphatase: 68 IU/L (ref 44–121)
BUN/Creatinine Ratio: 16 (ref 10–24)
BUN: 24 mg/dL (ref 8–27)
Bilirubin Total: 0.3 mg/dL (ref 0.0–1.2)
CO2: 25 mmol/L (ref 20–29)
Calcium: 9.2 mg/dL (ref 8.6–10.2)
Chloride: 103 mmol/L (ref 96–106)
Creatinine, Ser: 1.48 mg/dL — ABNORMAL HIGH (ref 0.76–1.27)
Globulin, Total: 2.4 g/dL (ref 1.5–4.5)
Glucose: 114 mg/dL — ABNORMAL HIGH (ref 70–99)
Potassium: 4.5 mmol/L (ref 3.5–5.2)
Sodium: 142 mmol/L (ref 134–144)
Total Protein: 6.5 g/dL (ref 6.0–8.5)
eGFR: 49 mL/min/{1.73_m2} — ABNORMAL LOW (ref >59)

## 2022-02-11 LAB — LIPID PANEL WITH LDL/HDL RATIO
Cholesterol, Total: 152 mg/dL (ref 100–199)
HDL: 37 mg/dL — ABNORMAL LOW (ref >39)
LDL Chol Calc (NIH): 87 mg/dL (ref 0–99)
LDL/HDL Ratio: 2.4 ratio (ref 0.0–3.6)
Triglycerides: 160 mg/dL — ABNORMAL HIGH (ref 0–149)
VLDL Cholesterol Cal: 28 mg/dL (ref 5–40)

## 2022-02-11 LAB — LDL CHOLESTEROL, DIRECT: LDL Direct: 82 mg/dL (ref 0–99)

## 2022-02-13 ENCOUNTER — Ambulatory Visit: Payer: Medicare HMO | Admitting: Cardiology

## 2022-02-13 ENCOUNTER — Encounter: Payer: Self-pay | Admitting: Cardiology

## 2022-02-13 VITALS — BP 108/57 | HR 55 | Ht 71.0 in | Wt 153.0 lb

## 2022-02-13 DIAGNOSIS — F17210 Nicotine dependence, cigarettes, uncomplicated: Secondary | ICD-10-CM

## 2022-02-13 DIAGNOSIS — I6523 Occlusion and stenosis of bilateral carotid arteries: Secondary | ICD-10-CM | POA: Diagnosis not present

## 2022-02-13 DIAGNOSIS — I10 Essential (primary) hypertension: Secondary | ICD-10-CM

## 2022-02-13 DIAGNOSIS — I739 Peripheral vascular disease, unspecified: Secondary | ICD-10-CM

## 2022-02-13 DIAGNOSIS — R69 Illness, unspecified: Secondary | ICD-10-CM | POA: Diagnosis not present

## 2022-02-13 DIAGNOSIS — E119 Type 2 diabetes mellitus without complications: Secondary | ICD-10-CM

## 2022-02-13 DIAGNOSIS — I480 Paroxysmal atrial fibrillation: Secondary | ICD-10-CM

## 2022-02-13 DIAGNOSIS — Z7901 Long term (current) use of anticoagulants: Secondary | ICD-10-CM

## 2022-02-13 MED ORDER — NICOTINE POLACRILEX 4 MG MT LOZG: 4.0000 mg | LOZENGE | OROMUCOSAL | 0 refills | Status: DC | PRN

## 2022-02-13 MED ORDER — ROSUVASTATIN CALCIUM 20 MG PO TABS: 20.0000 mg | ORAL_TABLET | Freq: Every day | ORAL | 0 refills | Status: DC

## 2022-02-13 NOTE — Progress Notes (Signed)
Primary Physician/Referring:  Midge Minium, MD  Patient ID: John Heman., male    DOB: August 29, 1945, 77 y.o.   MRN: [id number]  Date: 02/13/22 Last Office Visit: 01/12/2022  Chief Complaint  Patient presents with   Bilateral carotid artery stenosis   Atrial Fibrillation   Follow-up   HPI:    John Villamar.  is a 77 y.o. with history of COPD, hypertension, hyperlipidemia, type II diabetes mellitus non-insulin dependent, paroxysmal atrial fibrillation, history of COVID-19 infection, peripheral artery disease, smoking.    Patient is being followed by the practice for management of atrial fibrillation, carotid disease and PAD.  He is accompanied by his wife John Woodward (would like to be addressed as Ginger).  At the last office visit the shared decision was to proceed with a repeat carotid duplex to reevaluate disease progression.  Based on the most recent carotid duplex disease involving the right ICA is in the range of 60-79%.  Patient denies any focal neurological deficits or vision changes at this time.   With regards to PAD in the past we have discussed considering either abdominal aortic runoff versus CT angiography to evaluate for obstructive disease.  However in the past they refused additional testing.  His overall functional capacity is limited due to left ankle fracture in the past.  But based on the amount of activity he is able to carry forward he denies claudication.  With regards to atrial fibrillation he is doing well on current AV nodal blocking agents and anticoagulation.  He does not endorse any evidence of bleeding.  Past Medical History:  Diagnosis Date   Arthritis    Carotid artery disease ([place])    Closed fracture dislocation of right elbow    30 years ago -- fell out of theback of a truck   Diabetes mellitus without complication ([place])    Hx of colonic polyps 02/23/2017   Hyperlipidemia    Hypertension    Diagnosed 10 years ago. No on medication.  Denies history of CAD.    Memory loss    PAD (peripheral artery disease) [facility])    Past Surgical History:  Procedure Laterality Date   OLECRANON BURSECTOMY Right 08/18/2016   Procedure: OLECRANON BURSECTOMY;  Surgeon: Meredith Pel, MD;  Location: [facility];  Service: Orthopedics;  Laterality: Right;   Family History  Problem Relation Age of Onset   Hypertension Mother    Dementia Mother        68s   Transient ischemic attack Mother        1s   Kidney disease Father    COPD Father    Hypertension Brother    Lung cancer Maternal Aunt    Alcohol abuse Maternal Grandfather    Mental illness Paternal Grandmother    Hypertension Daughter    Healthy Son    Learning disabilities Son    Colon cancer Neg Hx    Colon polyps Neg Hx    Esophageal cancer Neg Hx    Rectal cancer Neg Hx    Stomach cancer Neg Hx     Social History   Tobacco Use   Smoking status: Every Day    Packs/day: 0.25    Years: 50.00    Total pack years: 12.50    Types: Cigarettes    Start date: 08/12/1966   Smokeless tobacco: Never   Tobacco comments:    Taking Nicotine lozenges  Substance Use Topics   Alcohol use: No   Marital Status: Married  ROS  Review of Systems  Cardiovascular:  Negative for chest pain, claudication, dyspnea on exertion, leg swelling, near-syncope, orthopnea, palpitations, paroxysmal nocturnal dyspnea and syncope.  Hematologic/Lymphatic:       No bleeding   Gastrointestinal:  Negative for melena.  Neurological:  Negative for dizziness.   Objective  Blood pressure (!) 108/57, pulse (!) 55, height '5\' 11"'$  (1.803 m), weight 153 lb (69.4 kg), SpO2 96 %.     02/13/2022   10:45 AM 01/12/2022   10:06 AM 01/08/2022   12:46 PM  Vitals with BMI  Height '5\' 11"'$  '5\' 11"'$  '5\' 11"'$   Weight 153 lbs 153 lbs 149 lbs 6 oz  BMI 21.35 51.88 41.66  Systolic 063 016 010  Diastolic 57 70 76  Pulse 55 67 100     Physical Exam Vitals reviewed.  Cardiovascular:     Rate and Rhythm: Normal rate  and regular rhythm.     Pulses: No decreased pulses.          Carotid pulses are 2+ on the right side with bruit and 2+ on the left side.      Radial pulses are 2+ on the right side and 2+ on the left side.       Femoral pulses are 2+ on the right side and 1+ on the left side.      Popliteal pulses are 1+ on the right side and 1+ on the left side.       Dorsalis pedis pulses are 0 on the right side and 0 on the left side.       Posterior tibial pulses are 0 on the right side and 0 on the left side.     Heart sounds: S1 normal and S2 normal. No murmur heard.    No gallop.     Comments: Poor nail hygiene, no cyanosis or gangrene noted. Pulmonary:     Effort: Pulmonary effort is normal. No respiratory distress.     Breath sounds: No wheezing, rhonchi or rales.     Comments: Decreased breath sounds bilaterally.  Kyphosis of the thoracic spine Musculoskeletal:     Right lower leg: No edema.     Left lower leg: No edema.  Neurological:     Mental Status: He is alert.    Laboratory examination:   Recent Labs    01/22/22 1008 01/27/22 1343 02/10/22 0936  NA 138 139 142  K 5.2 No hemolysis seen* 4.6 4.5  CL 99 101 103  CO2 32 31 25  GLUCOSE 118* 96 114*  BUN 23 28* 24  CREATININE 1.61* 1.43 1.48*  CALCIUM 9.9 9.5 9.2   estimated creatinine clearance is 41.7 mL/min (A) (by C-G formula based on SCr of 1.48 mg/dL (H)).     Latest Ref Rng & Units 02/10/2022    9:36 AM 01/27/2022    1:43 PM 01/22/2022   10:08 AM  CMP  Glucose 70 - 99 mg/dL 114  96  118   BUN 8 - 27 mg/dL '24  28  23   '$ Creatinine 0.76 - 1.27 mg/dL 1.48  1.43  1.61   Sodium 134 - 144 mmol/L 142  139  138   Potassium 3.5 - 5.2 mmol/L 4.5  4.6  5.2 No hemolysis seen   Chloride 96 - 106 mmol/L 103  101  99   CO2 20 - 29 mmol/L 25  31  32   Calcium 8.6 - 10.2 mg/dL 9.2  9.5  9.9   Total  Protein 6.0 - 8.5 g/dL 6.5     Total Bilirubin 0.0 - 1.2 mg/dL 0.3     Alkaline Phos 44 - 121 IU/L 68     AST 0 - 40 IU/L 16     ALT  0 - 44 IU/L 13         Latest Ref Rng & Units 01/08/2022    1:15 PM 06/10/2021    9:46 AM 12/05/2020   11:30 AM  CBC  WBC 4.0 - 10.5 K/uL 5.8  4.9  4.9   Hemoglobin 13.0 - 17.0 g/dL 14.5  14.9  14.9   Hematocrit 39.0 - 52.0 % 42.5  44.4  44.5   Platelets 150.0 - 400.0 K/uL 258.0  260.0  233.0     Lipid Panel Recent Labs    06/10/21 0946 01/08/22 1315 02/10/22 0936  CHOL 170 149 152  TRIG 243.0* 179.0* 160*  LDLCALC  --  69 87  VLDL 48.6* 35.8  --   HDL 41.30 44.10 37*  CHOLHDL 4 3  --   LDLDIRECT 88.0  --  82    HEMOGLOBIN A1C Lab Results  Component Value Date   HGBA1C 6.5 01/08/2022   MPG 131.24 12/03/2019   TSH Recent Labs    06/10/21 0946 01/08/22 1315  TSH 2.14 1.40    External labs:  None   Allergies   Allergies  Allergen Reactions   Antibacterial Hand Soap [Triclosan] Other (See Comments)    Caused water blisters   Medications Prior to Visit:   Outpatient Medications Prior to Visit  Medication Sig Dispense Refill   amLODipine (NORVASC) 10 MG tablet Take 1 tablet (10 mg total) by mouth daily. 90 tablet 1   ELIQUIS 5 MG TABS tablet TAKE ONE TABLET BY MOUTH TWICE DAILY 180 tablet 1   escitalopram (LEXAPRO) 20 MG tablet Take 1 tablet (20 mg total) by mouth daily. 90 tablet 3   hydrALAZINE (APRESOLINE) 50 MG tablet Take 1 tablet (50 mg total) by mouth 3 (three) times daily. 270 tablet 2   hydrochlorothiazide (MICROZIDE) 12.5 MG capsule TAKE ONE CAPSULE BY MOUTH DAILY 90 capsule 3   metFORMIN (GLUCOPHAGE) 500 MG tablet Take one tablet every morning and take two tablets by mouth every evening (Patient taking differently: Take 500 mg by mouth daily with breakfast. Take one tablet every morning and take two tablets by mouth every evening) 270 tablet 2   telmisartan (MICARDIS) 80 MG tablet TAKE ONE TABLET BY MOUTH ONCE DAILY 90 tablet 3   nicotine polacrilex (COMMIT) 4 MG lozenge Take 4 mg by mouth as needed for smoking cessation.     rosuvastatin (CRESTOR)  10 MG tablet Take 1 tablet (10 mg total) by mouth daily. 90 tablet 2   nicotine (NICODERM CQ - DOSED IN MG/24 HR) 7 mg/24hr patch PLACE 1 PATCH (7 MG TOTAL) ONTO THE SKIN DAILY. (Patient not taking: Reported on 02/13/2022) 28 patch 0   No facility-administered medications prior to visit.   Final Medications at End of Visit    Current Meds  Medication Sig   amLODipine (NORVASC) 10 MG tablet Take 1 tablet (10 mg total) by mouth daily.   ELIQUIS 5 MG TABS tablet TAKE ONE TABLET BY MOUTH TWICE DAILY   escitalopram (LEXAPRO) 20 MG tablet Take 1 tablet (20 mg total) by mouth daily.   hydrALAZINE (APRESOLINE) 50 MG tablet Take 1 tablet (50 mg total) by mouth 3 (three) times daily.   hydrochlorothiazide (MICROZIDE) 12.5 MG capsule TAKE  ONE CAPSULE BY MOUTH DAILY   metFORMIN (GLUCOPHAGE) 500 MG tablet Take one tablet every morning and take two tablets by mouth every evening (Patient taking differently: Take 500 mg by mouth daily with breakfast. Take one tablet every morning and take two tablets by mouth every evening)   rosuvastatin (CRESTOR) 20 MG tablet Take 1 tablet (20 mg total) by mouth at bedtime.   telmisartan (MICARDIS) 80 MG tablet TAKE ONE TABLET BY MOUTH ONCE DAILY   [DISCONTINUED] nicotine polacrilex (COMMIT) 4 MG lozenge Take 4 mg by mouth as needed for smoking cessation.   [DISCONTINUED] rosuvastatin (CRESTOR) 10 MG tablet Take 1 tablet (10 mg total) by mouth daily.   Radiology:   No results found.  Cardiac Studies:  Lower Extremity Arterial Duplex 07/08/2021:  There is severe diffuse calcific plaque through out the bilateral lower  extremity.  Moderate velocity increase at the right mid superficial femoral artery suggests hemodynamically significant stenosis of >50% with monophasic waveform below the stenosis.  Dampened monophasic waveform in the left EIA and CFA suggests hemodynamically significant stenosis in the proximal vessel (CIA).  The left SFA is occluded in the proximal to  mid segment with collateral reconstitution into the distal SFA.  This exam reveals mildly decreased perfusion of the right lower extremity, noted at the post tibial artery level (ABI 0.8) with severely abnormal monophasic waveform at the right ankle.  This exam reveals moderately decreased perfusion of the left lower extremity  Carotid artery duplex 07/08/2021:  Duplex suggests stenosis in the right internal carotid artery (50-69%).  Duplex suggests stenosis in the left internal carotid artery (50-69%).  Duplex suggests stenosis in the left external carotid artery (<50%).  Antegrade right vertebral artery flow. Antegrade left vertebral artery  flow with resistant flow and may suggest distal stenosis.  No significant change since 12/06/2020. Follow up in six months is  appropriate if clinically indicated.  Carotid Duplex 02/02/2022 Right Carotid: Velocities in the right ICA are consistent with a 60-79% stenosis.  Left Carotid: Velocities in the left ICA are consistent with a 40-59% stenosis.  Vertebrals: Bilateral vertebral arteries demonstrate antegrade flow. Subclavians: Normal flow hemodynamics were seen in bilateral subclavian arteries.   ABI 12/29/2019:  This exam reveals moderately decreased perfusion of the right lower extremity, noted at the post tibial artery level (ABI 0.51) and severely decreased perfusion of the left lower extremity, noted at the dorsalis pedis and post tibial artery level (ABI 0.32).  Right AT appears to be occluded with dampened waveform. There is severely abnormal monophasic waveform of all vessels at the level of the ankle.  PCV MYOCARDIAL PERFUSION WO LEXISCAN 12/27/2019 Nondiagnostic ECG stress due to pharmacologic stress. Resting EKG demonstrated normal sinus rhythm. Non-specific T-wave abnormalities. Peak EKG revealed no significant ST-T changes. Myocardial perfusion is normal. Overall LV systolic function is normal without regional wall motion  abnormalities. Stress LV EF: 58%. No previous exam available for comparison. Low risk.  Echocardiogram 12/04/2019:  1. Patient in afib during exam.   2. Left ventricular ejection fraction, by estimation, is 60 to 65%. The left ventricle has normal function. The left ventricle has no regional wall motion abnormalities. Left ventricular diastolic parameters are indeterminate.   3. Right ventricular systolic function is normal. The right ventricular size is normal.   4. Left atrial size was mildly dilated.   5. The mitral valve is degenerative. Trivial mitral valve regurgitation. No evidence of mitral stenosis.   6. The aortic valve was not well visualized. Aortic  valve regurgitation is not visualized. Mild to moderate aortic valve sclerosis/calcification is present, without any evidence of aortic stenosis.   7. The inferior vena cava is normal in size with greater than 50% respiratory variability, suggesting right atrial pressure of 3 mmHg.  EKG:  12/04/2019: Atrial fibrillation with rapid ventricular response at a rate of 107 bpm. Normal axis, left atrial. Enlargement.   02/13/2022: Sinus bradycardia, 52 bpm, without underlying injury pattern.  Assessment     ICD-10-CM   1. Bilateral carotid artery stenosis  I65.23 Ambulatory referral to Vascular Surgery    rosuvastatin (CRESTOR) 20 MG tablet    Lipid Panel With LDL/HDL Ratio    LDL cholesterol, direct    CMP14+EGFR    2. Essential hypertension  I10     3. Paroxysmal atrial fibrillation (HCC)  I48.0 EKG 12-Lead    4. Long term (current) use of anticoagulants  Z79.01     5. Peripheral arterial disease (HCC)  I73.9 Ambulatory referral to Vascular Surgery    rosuvastatin (CRESTOR) 20 MG tablet    Lipid Panel With LDL/HDL Ratio    LDL cholesterol, direct    CMP14+EGFR    6. Non-insulin dependent type 2 diabetes mellitus ([place])  E11.9     7. Continuous dependence on cigarette smoking  F17.210 nicotine polacrilex (COMMIT) 4 MG lozenge      Medications Discontinued During This Encounter  Medication Reason   nicotine (NICODERM CQ - DOSED IN MG/24 HR) 7 mg/24hr patch Patient Preference   rosuvastatin (CRESTOR) 10 MG tablet Dose change   nicotine polacrilex (COMMIT) 4 MG lozenge Reorder     Meds ordered this encounter  Medications   nicotine polacrilex (COMMIT) 4 MG lozenge    Sig: Take 1 lozenge (4 mg total) by mouth as needed for up to 90 doses for smoking cessation.    Dispense:  90 tablet    Refill:  0   rosuvastatin (CRESTOR) 20 MG tablet    Sig: Take 1 tablet (20 mg total) by mouth at bedtime.    Dispense:  90 tablet    Refill:  0   This patients CHA2DS2-VASc Score 5 (HTN, DM, Age, PAD) and yearly risk of stroke 7.2%.  Recommendations:   Lily Kernen. is a 77 y.o. with history of COPD, hypertension, hyperlipidemia, type II diabetes mellitus non-insulin dependent, paroxysmal atrial fibrillation, history of COVID-19 infection, peripheral artery disease, smoking.    Bilateral carotid artery stenosis Asymptomatic. Right ICA stenosis 60-79% and left ICA stenosis 40-59% per carotid duplex. We discussed proceeding with CTA head and neck to reevaluate disease progression more accurately and if significant consider vascular surgery consult.  However, patient and wife would like to proceed with vascular surgery consult first prior to proceeding with CTA. Reemphasized importance of complete smoking cessation. Most recent lipid profile reviewed. Will increase rosuvastatin to 20 mg p.o. nightly. Triglyceride levels are not well-controlled-she would like to implement lifestyle changes prior to considering pharmacological therapy.  Peripheral arterial disease (HCC) Asymptomatic. No cyanosis or gangrene present.  Weak peripheral pulses as noted above Both ankle-brachial index and lower extremity arterial duplex consistent with PAD. Educated him on importance of complete smoking cessation.  Given the results of the  lower extremity arterial duplex recommended CT angiography versus abdominal aortic runoff to evaluate for obstructive PAD.  In the past they have been resistant with additional testing.  However, they will discuss the management with vascular surgery coming up. We emphasized the importance of complete smoking cessation.  He did well with lozenges versus nicotine patch prescription refill will be provided I have asked her to increase physical activity as tolerated with a goal of 30 minutes a day 5 days a week.  Continue statin therapy  Paroxysmal atrial fibrillation [facility]) Diagnosed November 2021. Rate control: NA ventricular rate well-controlled. Rhythm control: N/A Thromboembolic prophylaxis: Eliquis Patient does not endorse any evidence of bleeding.  Long term (current) use of anticoagulants Indication: Paroxysmal atrial fibrillation Currently on Eliquis. Most recent hemoglobin hematocrit from December 2023 stable Estimated Creatinine Clearance: 41.7 mL/min (A) (by C-G formula based on SCr of 1.48 mg/dL (H)). Serum creatinine: 1.48 mg/dL (H) 02/10/22 0936 Estimated creatinine clearance: 41.7 mL/min (A) Patient does not endorse any obvious bleeding. Also reviewed with the patient the risks, benefits, and alternatives of oral anticoagulation.  Non-insulin-dependent diabetes mellitus type 2:: Recommended that he follows up with podiatry.  Educated him on the importance of glycemic control. Currently on ARB, statin therapy, metformin. Consider the initiation of Jardiance or Wilder Glade will defer management to PCP  Tobacco abuse disorder Tobacco cessation counseling: Currently smoking 1 pack / week Last office visit patient was given a prescription for nicotine patches as well as lozenges.  Patient states that he is doing well with lozenges as opposed to the patches and is requesting a refill. Patient was informed of the dangers of tobacco abuse including stroke, cancer, and MI, as well as  benefits of tobacco cessation. Patient is willing to quit at this time. 8 mins were spent counseling patient cessation techniques. We discussed various methods to help quit smoking, including deciding on a date to quit, joining a support group, pharmacological agents- nicotine gum/patch/lozenges.  Refilled lozenges.  I will reassess his progress at the next follow-up visit

## 2022-02-16 DIAGNOSIS — Z7984 Long term (current) use of oral hypoglycemic drugs: Secondary | ICD-10-CM | POA: Diagnosis not present

## 2022-02-16 DIAGNOSIS — H2513 Age-related nuclear cataract, bilateral: Secondary | ICD-10-CM | POA: Diagnosis not present

## 2022-02-16 DIAGNOSIS — E119 Type 2 diabetes mellitus without complications: Secondary | ICD-10-CM | POA: Diagnosis not present

## 2022-02-16 LAB — HM DIABETES EYE EXAM

## 2022-02-18 ENCOUNTER — Other Ambulatory Visit: Payer: Self-pay | Admitting: *Deleted

## 2022-02-18 DIAGNOSIS — Z8249 Family history of ischemic heart disease and other diseases of the circulatory system: Secondary | ICD-10-CM | POA: Diagnosis not present

## 2022-02-18 DIAGNOSIS — E1151 Type 2 diabetes mellitus with diabetic peripheral angiopathy without gangrene: Secondary | ICD-10-CM | POA: Diagnosis not present

## 2022-02-18 DIAGNOSIS — J449 Chronic obstructive pulmonary disease, unspecified: Secondary | ICD-10-CM | POA: Diagnosis not present

## 2022-02-18 DIAGNOSIS — Z7984 Long term (current) use of oral hypoglycemic drugs: Secondary | ICD-10-CM | POA: Diagnosis not present

## 2022-02-18 DIAGNOSIS — E785 Hyperlipidemia, unspecified: Secondary | ICD-10-CM | POA: Diagnosis not present

## 2022-02-18 DIAGNOSIS — I739 Peripheral vascular disease, unspecified: Secondary | ICD-10-CM

## 2022-02-18 DIAGNOSIS — E1142 Type 2 diabetes mellitus with diabetic polyneuropathy: Secondary | ICD-10-CM | POA: Diagnosis not present

## 2022-02-18 DIAGNOSIS — I951 Orthostatic hypotension: Secondary | ICD-10-CM | POA: Diagnosis not present

## 2022-02-18 DIAGNOSIS — G3184 Mild cognitive impairment, so stated: Secondary | ICD-10-CM | POA: Diagnosis not present

## 2022-02-18 DIAGNOSIS — Z008 Encounter for other general examination: Secondary | ICD-10-CM | POA: Diagnosis not present

## 2022-02-18 DIAGNOSIS — R69 Illness, unspecified: Secondary | ICD-10-CM | POA: Diagnosis not present

## 2022-02-18 DIAGNOSIS — D6869 Other thrombophilia: Secondary | ICD-10-CM | POA: Diagnosis not present

## 2022-02-18 DIAGNOSIS — I10 Essential (primary) hypertension: Secondary | ICD-10-CM | POA: Diagnosis not present

## 2022-02-18 DIAGNOSIS — Z823 Family history of stroke: Secondary | ICD-10-CM | POA: Diagnosis not present

## 2022-02-19 ENCOUNTER — Ambulatory Visit: Payer: Medicare HMO | Admitting: Podiatry

## 2022-02-19 VITALS — BP 121/68

## 2022-02-19 DIAGNOSIS — M79675 Pain in left toe(s): Secondary | ICD-10-CM

## 2022-02-19 DIAGNOSIS — M79674 Pain in right toe(s): Secondary | ICD-10-CM

## 2022-02-19 DIAGNOSIS — E1142 Type 2 diabetes mellitus with diabetic polyneuropathy: Secondary | ICD-10-CM

## 2022-02-19 DIAGNOSIS — I739 Peripheral vascular disease, unspecified: Secondary | ICD-10-CM

## 2022-02-19 DIAGNOSIS — B351 Tinea unguium: Secondary | ICD-10-CM

## 2022-02-19 NOTE — Progress Notes (Signed)
  Subjective:  Patient ID: John Heman., male    DOB: [birth year],  MRN: [id number]  Chief Complaint  Patient presents with   Nail Problem    DFC/Diabetic foot evaluation  BS-do not check A1C-5.9    77 y.o. male presents with the above complaint. History confirmed with patient. Patient presenting with pain related to dystrophic thickened elongated nails. Patient is unable to trim own nails related to nail dystrophy and/or mobility issues. Patient does have a history of T2DM not on insulin.  Patient also has a history of peripheral arterial disease he is seeing a vascular surgeon soon for concern for carotid artery blockage and is also planning to get a CT scan to check the blood flow in his bilateral lower extremity.  Objective:  Physical Exam: warm, good capillary refill nail exam onychomycosis of the toenails, onycholysis, and dystrophic nails.  No open ulcerations present bilateral lower extremity there is xerotic skin present DP pulses non palpable, PT pulses non palpable, and protective sensation absent to forefoot area Left Foot:  Pain with palpation of nails due to elongation and dystrophic growth.  Right Foot: Pain with palpation of nails due to elongation and dystrophic growth.   Assessment:   1. Pain due to onychomycosis of toenails of both feet   2. PAD (peripheral artery disease) ([place])   3. DM type 2 with diabetic peripheral neuropathy ([place])      Plan:  Patient was evaluated and treated and all questions answered.  # Type 2 diabetes mellitus with peripheral arterial disease and neuropathy Patient educated on diabetes. Discussed proper diabetic foot care and discussed risks and complications of disease. Educated patient in depth on reasons to return to the office immediately should he/she discover anything concerning or new on the feet. All questions answered. Discussed proper shoes as well.  -Rec the patient follow-up with vascular specialist for CT angiogram  bilateral lower extremity to which assess blood flow he may ultimately need revascularization however he does not have any ulcerations or evidence of gangrene at this time  #Onychomycosis with pain  -Nails palliatively debrided as below. -Educated on self-care  Procedure: Nail Debridement Rationale: Pain Type of Debridement: manual, sharp debridement. Instrumentation: Nail nipper, rotary burr. Number of Nails: 10  Return in about 3 months (around 05/21/2022) for [facility]         John Woodward, DPM [facility]

## 2022-03-02 ENCOUNTER — Encounter: Payer: Self-pay | Admitting: Surgery

## 2022-03-02 ENCOUNTER — Ambulatory Visit: Payer: Medicare HMO | Admitting: Surgery

## 2022-03-02 ENCOUNTER — Ambulatory Visit (HOSPITAL_COMMUNITY)
Admission: RE | Admit: 2022-03-02 | Discharge: 2022-03-02 | Disposition: A | Discharge status: Home / Self Care | Payer: Medicare HMO | Source: Ambulatory Visit | Attending: Surgery | Admitting: Surgery

## 2022-03-02 VITALS — BP 135/77 | HR 59 | Temp 97.9°F | Resp 20 | Ht 71.0 in | Wt 153.0 lb

## 2022-03-02 DIAGNOSIS — I739 Peripheral vascular disease, unspecified: Secondary | ICD-10-CM | POA: Diagnosis not present

## 2022-03-02 DIAGNOSIS — I70213 Atherosclerosis of native arteries of extremities with intermittent claudication, bilateral legs: Secondary | ICD-10-CM | POA: Diagnosis not present

## 2022-03-02 DIAGNOSIS — I6523 Occlusion and stenosis of bilateral carotid arteries: Secondary | ICD-10-CM

## 2022-03-02 LAB — VAS US ABI WITH/WO TBI
Left ABI: 0.48
Right ABI: 0.67

## 2022-03-02 NOTE — Progress Notes (Signed)
[facility] [place]  Patient name: John Woodward. MRN: [id number] DOB: 03/14/1945 Sex: male   REQUESTING PROVIDER:    Dr. Terri Skains   REASON FOR CONSULT:    Carotid and PAD  HISTORY OF PRESENT ILLNESS:   John Vondrak. is a 77 y.o. male, who is referred for evaluation of carotid and vascular disease.  He has a carotid ultrasound that shows 60-79% right-sided stenosis and 40-59% left-sided stenosis.  He is asymptomatic.  Specifically, he denies numbness or weakness in either extremity.  He denies slurred speech.  He denies amaurosis fugax.  He is also here for lower extremity peripheral vascular disease.  He denies any symptoms of claudication.  He does not have any open wounds.  The patient is a diabetic with an A1c in the 6 range.  He is a current smoker.  He is medically managed for hypertension with an ARB.  He takes a statin for hypercholesterolemia.  He is on Eliquis for AFIB  PAST MEDICAL HISTORY    Past Medical History:  Diagnosis Date   Arthritis    Carotid artery disease ([place])    Closed fracture dislocation of right elbow    30 years ago -- fell out of theback of a truck   Diabetes mellitus without complication ([place])    Hx of colonic polyps 02/23/2017   Hyperlipidemia    Hypertension    Diagnosed 10 years ago. No on medication. Denies history of CAD.    Memory loss    PAD (peripheral artery disease) ([place])      FAMILY HISTORY   Family History  Problem Relation Age of Onset   Hypertension Mother    Dementia Mother        61s   Transient ischemic attack Mother        91s   Kidney disease Father    COPD Father    Hypertension Brother    Lung cancer Maternal Aunt    Alcohol abuse Maternal Grandfather    Mental illness Paternal Grandmother    Hypertension Daughter    Healthy Son    Learning disabilities Son    Colon cancer Neg Hx    Colon polyps Neg Hx    Esophageal cancer Neg Hx    Rectal  cancer Neg Hx    Stomach cancer Neg Hx     SOCIAL HISTORY:   Social History   Socioeconomic History   Marital status: Married    Spouse name: Ginger   Number of children: 3   Years of education: Not on file   Highest education level: 12th grade  Occupational History   Occupation: Retired    Comment: Dealer  Tobacco Use   Smoking status: Every Day    Packs/day: 0.25    Years: 50.00    Total pack years: 12.50    Types: Cigarettes    Start date: 08/12/1966   Smokeless tobacco: Never   Tobacco comments:    Taking Nicotine lozenges  Vaping Use   Vaping Use: Never used  Substance and Sexual Activity   Alcohol use: No   Drug use: No   Sexual activity: Yes  Other Topics Concern   Not on file  Social History Narrative   Social Review      Patient lives at home with. Spouse Jakori Burkett   Pt lives in a one or two story home? One story home   Does patient lives in a facility? If so where? Private  home   What is patient highest level of education? 12 grade   Pt is RIGHT handed            Daughter died in 12-12-21   Social Determinants of Health   Financial Resource Strain: Not on file  Food Insecurity: No Food Insecurity (12/12/2019)   Hunger Vital Sign    Worried About Running Out of Food in the Last Year: Never true    Ran Out of Food in the Last Year: Never true  Transportation Needs: No Transportation Needs (12/12/2019)   PRAPARE - Hydrologist (Medical): No    Lack of Transportation (Non-Medical): No  Physical Activity: Not on file  Stress: Not on file  Social Connections: Not on file  Intimate Partner Violence: Not on file    ALLERGIES:    Allergies  Allergen Reactions   Antibacterial Hand Soap [Triclosan] Other (See Comments)    Caused water blisters    CURRENT MEDICATIONS:    Current Outpatient Medications  Medication Sig Dispense Refill   amLODipine (NORVASC) 10 MG tablet Take 1 tablet (10 mg total) by  mouth daily. 90 tablet 1   ELIQUIS 5 MG TABS tablet TAKE ONE TABLET BY MOUTH TWICE DAILY 180 tablet 1   escitalopram (LEXAPRO) 20 MG tablet Take 1 tablet (20 mg total) by mouth daily. 90 tablet 3   hydrALAZINE (APRESOLINE) 50 MG tablet Take 1 tablet (50 mg total) by mouth 3 (three) times daily. 270 tablet 2   hydrochlorothiazide (MICROZIDE) 12.5 MG capsule TAKE ONE CAPSULE BY MOUTH DAILY 90 capsule 3   metFORMIN (GLUCOPHAGE) 500 MG tablet Take one tablet every morning and take two tablets by mouth every evening (Patient taking differently: Take 500 mg by mouth daily with breakfast. Take one tablet every morning and take two tablets by mouth every evening) 270 tablet 2   nicotine polacrilex (COMMIT) 4 MG lozenge Take 1 lozenge (4 mg total) by mouth as needed for up to 90 doses for smoking cessation. 90 tablet 0   rosuvastatin (CRESTOR) 20 MG tablet Take 1 tablet (20 mg total) by mouth at bedtime. 90 tablet 0   telmisartan (MICARDIS) 80 MG tablet TAKE ONE TABLET BY MOUTH ONCE DAILY 90 tablet 3   No current facility-administered medications for this visit.    REVIEW OF SYSTEMS:   '[X]'$  denotes positive finding, '[ ]'$  denotes negative finding Cardiac  Comments:  Chest pain or chest pressure:    Shortness of breath upon exertion:    Short of breath when lying flat:    Irregular heart rhythm:        Vascular    Pain in calf, thigh, or hip brought on by ambulation:    Pain in feet at night that wakes you up from your sleep:     Blood clot in your veins:    Leg swelling:         Pulmonary    Oxygen at home:    Productive cough:     Wheezing:         Neurologic    Sudden weakness in arms or legs:     Sudden numbness in arms or legs:     Sudden onset of difficulty speaking or slurred speech:    Temporary loss of vision in one eye:     Problems with dizziness:         Gastrointestinal    Blood in stool:      Vomited  blood:         Genitourinary    Burning when urinating:     Blood in  urine:        Psychiatric    Major depression:         Hematologic    Bleeding problems:    Problems with blood clotting too easily:        Skin    Rashes or ulcers:        Constitutional    Fever or chills:     PHYSICAL EXAM:   There were no vitals filed for this visit.  GENERAL: The patient is a well-nourished male, in no acute distress. The vital signs are documented above. CARDIAC: There is a regular rate and rhythm.  VASCULAR: Nonpalpable pedal pulses PULMONARY: Nonlabored respirations MUSCULOSKELETAL: There are no major deformities or cyanosis. NEUROLOGIC: No focal weakness or paresthesias are detected. SKIN: There are no ulcers or rashes noted. PSYCHIATRIC: The patient has a normal affect.  STUDIES:   I have reviewed the following: Right Carotid: Velocities in the right ICA are consistent with a 60-79%                 stenosis.   Left Carotid: Velocities in the left ICA are consistent with a 40-59%  stenosis.   Vertebrals: Bilateral vertebral arteries demonstrate antegrade flow.  Subclavians: Normal flow hemodynamics were seen in bilateral subclavian               arteries.   +-------+-----------+-----------+------------+------------+  ABI/TBIToday's ABIToday's TBIPrevious ABIPrevious TBI  +-------+-----------+-----------+------------+------------+  Right 0.67       0.18                                 +-------+-----------+-----------+------------+------------+  Left  0.48       0.13                                 +-------+-----------+-----------+------------+------------+  Right toe pressure:24 Left toe pressure:  17 ASSESSMENT and PLAN   Carotid: I discussed that he has moderate stenosis bilaterally.  He is asymptomatic.  We discussed the signs and symptoms of stroke and to go to the emergency department immediately should they occur.  Otherwise, he will continue with his current medication regimen and have a repeat ultrasound in 6  months  PAD: The patient has signs of moderate peripheral vascular disease with an ABI of 0.67 on the right and 0.48 on the left.  He has diminished toe pressures as well which is likely secondary to his diabetes.  Fortunately, he does not have any symptoms at this time.  He knows to contact me should he develop a nonhealing wound or ulcer.   Leia Alf, MD, FACS [facility] [phone] Pager [phone]

## 2022-03-04 ENCOUNTER — Other Ambulatory Visit: Payer: Self-pay

## 2022-03-04 DIAGNOSIS — I6523 Occlusion and stenosis of bilateral carotid arteries: Secondary | ICD-10-CM

## 2022-04-08 ENCOUNTER — Telehealth: Payer: Self-pay | Admitting: Family Medicine

## 2022-04-08 NOTE — Telephone Encounter (Signed)
Contacted John Woodward. to schedule their annual wellness visit. Appointment made for 04/16/2022.  Thank you,  [name] Direct dial  [phone]

## 2022-04-16 ENCOUNTER — Ambulatory Visit: Payer: Medicare HMO | Admitting: Cardiology

## 2022-04-16 ENCOUNTER — Ambulatory Visit (INDEPENDENT_AMBULATORY_CARE_PROVIDER_SITE_OTHER): Payer: Medicare HMO | Admitting: *Deleted

## 2022-04-16 DIAGNOSIS — Z0000 Encounter for general adult medical examination without abnormal findings: Secondary | ICD-10-CM | POA: Diagnosis not present

## 2022-04-16 NOTE — Patient Instructions (Signed)
John Woodward , Thank you for taking time to come for your Medicare Wellness Visit. I appreciate your ongoing commitment to your health goals. Please review the following plan we discussed and let me know if I can assist you in the future.   Screening recommendations/referrals: Colonoscopy: no longer required Recommended yearly ophthalmology/optometry visit for glaucoma screening and checkup Recommended yearly dental visit for hygiene and checkup  Vaccinations: Influenza vaccine: Education provided Pneumococcal vaccine: up to date Tdap vaccine: Education provided Shingles vaccine: Education provided    Advanced directives:     Preventive Care 77 Years and Older, Male Preventive care refers to lifestyle choices and visits with your health care provider that can promote health and wellness. What does preventive care include? A yearly physical exam. This is also called an annual well check. Dental exams once or twice a year. Routine eye exams. Ask your health care provider how often you should have your eyes checked. Personal lifestyle choices, including: Daily care of your teeth and gums. Regular physical activity. Eating a healthy diet. Avoiding tobacco and drug use. Limiting alcohol use. Practicing safe sex. Taking low doses of aspirin every day. Taking vitamin and mineral supplements as recommended by your health care provider. What happens during an annual well check? The services and screenings done by your health care provider during your annual well check will depend on your age, overall health, lifestyle risk factors, and family history of disease. Counseling  Your health care provider may ask you questions about your: Alcohol use. Tobacco use. Drug use. Emotional well-being. Home and relationship well-being. Sexual activity. Eating habits. History of falls. Memory and ability to understand (cognition). Work and work Statistician. Screening  You may have the  following tests or measurements: Height, weight, and BMI. Blood pressure. Lipid and cholesterol levels. These may be checked every 5 years, or more frequently if you are over 69 years old. Skin check. Lung cancer screening. You may have this screening every year starting at age 77 if you have a 30-pack-year history of smoking and currently smoke or have quit within the past 15 years. Fecal occult blood test (FOBT) of the stool. You may have this test every year starting at age 77. Flexible sigmoidoscopy or colonoscopy. You may have a sigmoidoscopy every 5 years or a colonoscopy every 10 years starting at age 77. Prostate cancer screening. Recommendations will vary depending on your family history and other risks. Hepatitis C blood test. Hepatitis B blood test. Sexually transmitted disease (STD) testing. Diabetes screening. This is done by checking your blood sugar (glucose) after you have not eaten for a while (fasting). You may have this done every 1-3 years. Abdominal aortic aneurysm (AAA) screening. You may need this if you are a current or former smoker. Osteoporosis. You may be screened starting at age 26 if you are at high risk. Talk with your health care provider about your test results, treatment options, and if necessary, the need for more tests. Vaccines  Your health care provider may recommend certain vaccines, such as: Influenza vaccine. This is recommended every year. Tetanus, diphtheria, and acellular pertussis (Tdap, Td) vaccine. You may need a Td booster every 10 years. Zoster vaccine. You may need this after age 26. Pneumococcal 13-valent conjugate (PCV13) vaccine. One dose is recommended after age 77. Pneumococcal polysaccharide (PPSV23) vaccine. One dose is recommended after age 77. Talk to your health care provider about which screenings and vaccines you need and how often you need them. This information is not  intended to replace advice given to you by your health care  provider. Make sure you discuss any questions you have with your health care provider. Document Released: 02/08/2015 Document Revised: 10/02/2015 Document Reviewed: 11/13/2014 Elsevier Interactive Patient Education  2017 [place] Prevention in the Home Falls can cause injuries. They can happen to people of all ages. There are many things you can do to make your home safe and to help prevent falls. What can I do on the outside of my home? Regularly fix the edges of walkways and driveways and fix any cracks. Remove anything that might make you trip as you walk through a door, such as a raised step or threshold. Trim any bushes or trees on the path to your home. Use bright outdoor lighting. Clear any walking paths of anything that might make someone trip, such as rocks or tools. Regularly check to see if handrails are loose or broken. Make sure that both sides of any steps have handrails. Any raised decks and porches should have guardrails on the edges. Have any leaves, snow, or ice cleared regularly. Use sand or salt on walking paths during winter. Clean up any spills in your garage right away. This includes oil or grease spills. What can I do in the bathroom? Use night lights. Install grab bars by the toilet and in the tub and shower. Do not use towel bars as grab bars. Use non-skid mats or decals in the tub or shower. If you need to sit down in the shower, use a plastic, non-slip stool. Keep the floor dry. Clean up any water that spills on the floor as soon as it happens. Remove soap buildup in the tub or shower regularly. Attach bath mats securely with double-sided non-slip rug tape. Do not have throw rugs and other things on the floor that can make you trip. What can I do in the bedroom? Use night lights. Make sure that you have a light by your bed that is easy to reach. Do not use any sheets or blankets that are too big for your bed. They should not hang down onto the  floor. Have a firm chair that has side arms. You can use this for support while you get dressed. Do not have throw rugs and other things on the floor that can make you trip. What can I do in the kitchen? Clean up any spills right away. Avoid walking on wet floors. Keep items that you use a lot in easy-to-reach places. If you need to reach something above you, use a strong step stool that has a grab bar. Keep electrical cords out of the way. Do not use floor polish or wax that makes floors slippery. If you must use wax, use non-skid floor wax. Do not have throw rugs and other things on the floor that can make you trip. What can I do with my stairs? Do not leave any items on the stairs. Make sure that there are handrails on both sides of the stairs and use them. Fix handrails that are broken or loose. Make sure that handrails are as long as the stairways. Check any carpeting to make sure that it is firmly attached to the stairs. Fix any carpet that is loose or worn. Avoid having throw rugs at the top or bottom of the stairs. If you do have throw rugs, attach them to the floor with carpet tape. Make sure that you have a light switch at the top of the stairs  and the bottom of the stairs. If you do not have them, ask someone to add them for you. What else can I do to help prevent falls? Wear shoes that: Do not have high heels. Have rubber bottoms. Are comfortable and fit you well. Are closed at the toe. Do not wear sandals. If you use a stepladder: Make sure that it is fully opened. Do not climb a closed stepladder. Make sure that both sides of the stepladder are locked into place. Ask someone to hold it for you, if possible. Clearly mark and make sure that you can see: Any grab bars or handrails. First and last steps. Where the edge of each step is. Use tools that help you move around (mobility aids) if they are needed. These include: Canes. Walkers. Scooters. Crutches. Turn on the  lights when you go into a dark area. Replace any light bulbs as soon as they burn out. Set up your furniture so you have a clear path. Avoid moving your furniture around. If any of your floors are uneven, fix them. If there are any pets around you, be aware of where they are. Review your medicines with your doctor. Some medicines can make you feel dizzy. This can increase your chance of falling. Ask your doctor what other things that you can do to help prevent falls. This information is not intended to replace advice given to you by your health care provider. Make sure you discuss any questions you have with your health care provider. Document Released: 11/08/2008 Document Revised: 06/20/2015 Document Reviewed: 02/16/2014 Elsevier Interactive Patient Education  2017 [employer].

## 2022-04-16 NOTE — Progress Notes (Signed)
Subjective:   John Woodward. is a 77 y.o. male who presents for Medicare Annual/Subsequent preventive examination.  I connected with  John Woodward. on 04/16/22 by a telephone wife [place] patient care giver enabled telemedicine application and verified that I am speaking with the correct person using two identifiers.   I discussed the limitations of evaluation and management by telemedicine. The patient expressed understanding and agreed to proceed.  Patient location: home  Provider location: Home/telephone    Review of Systems     Cardiac Risk Factors include: advanced age (>80men, >15 women);male gender;sedentary lifestyle;smoking/ tobacco exposure;hypertension;diabetes mellitus     Objective:    Today's Vitals   There is no height or weight on file to calculate BMI.     04/16/2022   11:38 AM 04/16/2022   11:32 AM 12/09/2020   11:42 AM 12/12/2019    3:15 PM 12/03/2019    8:48 PM 08/19/2018    9:06 AM 02/03/2017   10:31 AM  Advanced Directives  Does Patient Have a Medical Advance Directive? Yes Yes Yes Yes No Yes Yes  Type of Industrial/product designer of [place];Living will  Living will;Healthcare Power of [place];Living will  Does patient want to make changes to medical advance directive?    No - Patient declined     Copy of [place] in Chart? Yes - validated most recent copy scanned in chart (See row information) Yes - validated most recent copy scanned in chart (See row information)       Would patient like information on creating a medical advance directive?     No - Patient declined      Current Medications (verified) Outpatient Encounter Medications as of 04/16/2022  Medication Sig   amLODipine (NORVASC) 10 MG tablet Take 1 tablet (10 mg total) by mouth daily.   ELIQUIS 5 MG TABS tablet TAKE ONE TABLET BY MOUTH TWICE DAILY   escitalopram  (LEXAPRO) 20 MG tablet Take 1 tablet (20 mg total) by mouth daily.   hydrALAZINE (APRESOLINE) 50 MG tablet Take 1 tablet (50 mg total) by mouth 3 (three) times daily.   hydrochlorothiazide (MICROZIDE) 12.5 MG capsule TAKE ONE CAPSULE BY MOUTH DAILY   metFORMIN (GLUCOPHAGE) 500 MG tablet Take one tablet every morning and take two tablets by mouth every evening (Patient taking differently: Take 500 mg by mouth daily with breakfast. Take one tablet every morning and take two tablets by mouth every evening)   nicotine polacrilex (COMMIT) 4 MG lozenge Take 1 lozenge (4 mg total) by mouth as needed for up to 90 doses for smoking cessation.   rosuvastatin (CRESTOR) 20 MG tablet Take 1 tablet (20 mg total) by mouth at bedtime.   telmisartan (MICARDIS) 80 MG tablet TAKE ONE TABLET BY MOUTH ONCE DAILY   [DISCONTINUED] metoprolol tartrate (LOPRESSOR) 25 MG tablet Take 1 tablet (25 mg total) by mouth 2 (two) times daily.   No facility-administered encounter medications on file as of 04/16/2022.    Allergies (verified) Antibacterial hand soap [triclosan]   History: Past Medical History:  Diagnosis Date   Arthritis    Carotid artery disease (HCC)    Closed fracture dislocation of right elbow    30 years ago -- fell out of theback of a truck   Diabetes mellitus without complication ([place])    Hx of colonic polyps 02/23/2017   Hyperlipidemia    Hypertension  Diagnosed 10 years ago. No on medication. Denies history of CAD.    Memory loss    PAD (peripheral artery disease[facility])    Past Surgical History:  Procedure Laterality Date   OLECRANON BURSECTOMY Right 08/18/2016   Procedure: OLECRANON BURSECTOMY;  Surgeon: Meredith Pel, MD;  Location: [place];  Service: Orthopedics;  Laterality: Right;   Family History  Problem Relation Age of Onset   Hypertension Mother    Dementia Mother        58s   Transient ischemic attack Mother        83s   Kidney disease Father    COPD Father     Hypertension Brother    Lung cancer Maternal Aunt    Alcohol abuse Maternal Grandfather    Mental illness Paternal Grandmother    Hypertension Daughter    Healthy Son    Learning disabilities Son    Colon cancer Neg Hx    Colon polyps Neg Hx    Esophageal cancer Neg Hx    Rectal cancer Neg Hx    Stomach cancer Neg Hx    Social History   Socioeconomic History   Marital status: Married    Spouse name: Ginger   Number of children: 3   Years of education: Not on file   Highest education level: 12th grade  Occupational History   Occupation: Retired    Comment: Dealer  Tobacco Use   Smoking status: Every Day    Packs/day: 0.25    Years: 50.00    Additional pack years: 0.00    Total pack years: 12.50    Types: Cigarettes    Start date: 08/12/1966   Smokeless tobacco: Never   Tobacco comments:    Taking Nicotine lozenges  Vaping Use   Vaping Use: Never used  Substance and Sexual Activity   Alcohol use: No   Drug use: No   Sexual activity: Yes  Other Topics Concern   Not on file  Social History Narrative   Social Review      Patient lives at home with. Spouse Garren Oba   Pt lives in a one or two story home? One story home   Does patient lives in a facility? If so where? Private home   What is patient highest level of education? 12 grade   Pt is RIGHT handed            Daughter died in 18-Dec-2021   Social Determinants of Health   Financial Resource Strain: Low Risk  (04/16/2022)   Overall Financial Resource Strain (CARDIA)    Difficulty of Paying Living Expenses: Not hard at all  Food Insecurity: No Food Insecurity (04/16/2022)   Hunger Vital Sign    Worried About Running Out of Food in the Last Year: Never true    Ran Out of Food in the Last Year: Never true  Transportation Needs: No Transportation Needs (04/16/2022)   PRAPARE - Hydrologist (Medical): No    Lack of Transportation (Non-Medical): No  Physical Activity:  Inactive (04/16/2022)   Exercise Vital Sign    Days of Exercise per Week: 0 days    Minutes of Exercise per Session: 0 min  Stress: No Stress Concern Present (04/16/2022)   [place]    Feeling of Stress : Not at all  Social Connections: Moderately Integrated (04/16/2022)   Social Connection and Isolation Panel [NHANES]    Frequency  of Communication with Friends and Family: Three times a week    Frequency of Social Gatherings with Friends and Family: Three times a week    Attends Religious Services: 1 to 4 times per year    Active Member of Clubs or Organizations: No    Attends Archivist Meetings: Never    Marital Status: Married    Tobacco Counseling Ready to quit: Not Answered Counseling given: Not Answered Tobacco comments: Taking Nicotine lozenges   Clinical Intake:  Pre-visit preparation completed: Yes  Pain : No/denies pain     Nutritional Risks: None Diabetes: Yes CBG done?: No Did pt. bring in CBG monitor from home?: No  How often do you need to have someone help you when you read instructions, pamphlets, or other written materials from your doctor or pharmacy?: 1 - Never  Diabetic? Yes  Nutrition Risk Assessment:  Has the patient had any N/V/D within the last 2 months?  No  Does the patient have any non-healing wounds?  No  Has the patient had any unintentional weight loss or weight gain?  No   Diabetes:  Is the patient diabetic?  Yes  If diabetic, was a CBG obtained today?  No  Did the patient bring in their glucometer from home?  No  How often do you monitor your CBG's? Does not check at home.   Financial Strains and Diabetes Management:  Are you having any financial strains with the device, your supplies or your medication? No .  Does the patient want to be seen by Chronic Care Management for management of their diabetes?  No  Would the patient like to be referred to a  Nutritionist or for Diabetic Management?  No   Diabetic Exams:  Diabetic Eye Exam: Completed   Pt has been advised about the importance in completing this exam. A referral has been placed today. Message sent to referral coordinator for scheduling purposes. Advised pt to expect a call from office referred to regarding appt.  Diabetic Foot Exam: Completed . Pt has been advised about the importance in completing this exam. Pt is scheduled for diabetic foot exam on .    Interpreter Needed?: No  Information entered by :: Leroy Kennedy LPN   Activities of Daily Living    04/16/2022   11:38 AM  In your present state of health, do you have any difficulty performing the following activities:  Hearing? 0  Vision? 0  Difficulty concentrating or making decisions? 1  Walking or climbing stairs? 0  Dressing or bathing? 0  Doing errands, shopping? 1  Preparing Food and eating ? N  Using the Toilet? N  In the past six months, have you accidently leaked urine? N  Do you have problems with loss of bowel control? N  Managing your Medications? Y  Managing your Finances? Y  Housekeeping or managing your Housekeeping? Y    Patient Care Team: Midge Minium, MD as PCP - General (Family Medicine) Gerda Diss, DO as Consulting Physician (Family Medicine) Marlou Sa Tonna Corner, MD as Consulting Physician (Orthopedic Surgery) Madelin Rear, [facility] (Inactive) as Pharmacist (Pharmacist)  Indicate any recent Medical Services you may have received from other than [facility] providers in the past year (date may be approximate).     Assessment:   This is a routine wellness examination for [place].  Hearing/Vision screen Hearing Screening - Comments:: No trouble hearing Vision Screening - Comments:: [place] Up to date   Dietary issues and exercise activities discussed: Current  Exercise Habits: The patient does not participate in regular exercise at present, Exercise limited by: neurologic condition(s)    Goals Addressed             This Visit's Progress    Patient Stated       No goals       Depression Screen    04/16/2022   11:37 AM 01/08/2022   12:46 PM 06/10/2021    9:04 AM 12/05/2020   10:40 AM 06/04/2020    9:54 AM 02/05/2020   11:11 AM 12/12/2019    3:52 PM  PHQ 2/9 Scores  PHQ - 2 Score 2 2 0 2 0 2   PHQ- 9 Score 3 7 0 3  2   Exception Documentation       Patient refusal    Fall Risk    04/16/2022   11:32 AM 01/08/2022   12:46 PM 06/10/2021    9:04 AM 12/09/2020   11:42 AM 12/05/2020   10:39 AM  Fall Risk   Falls in the past year? 0 0 0 0 0  Number falls in past yr: 0  0 0 0  Injury with Fall? 0  0 0 0  Risk for fall due to :  No Fall Risks No Fall Risks  No Fall Risks  Follow up Falls evaluation completed;Education provided;Falls prevention discussed Falls evaluation completed Falls evaluation completed  Falls evaluation completed    FALL RISK PREVENTION PERTAINING TO THE HOME:  Any stairs in or around the home? No  If so, are there any without handrails? No  Home free of loose throw rugs in walkways, pet beds, electrical cords, etc? Yes  Adequate lighting in your home to reduce risk of falls? Yes   ASSISTIVE DEVICES UTILIZED TO PREVENT FALLS:  Life alert? No  Use of a cane, walker or w/c? No  Grab bars in the bathroom? Yes  Shower chair or bench in shower? Yes  Elevated toilet seat or a handicapped toilet? No   TIMED UP AND GO:  Was the test performed? No .    Cognitive Function:   Patient did not finish has memory issues  patient wife care giver.       06/15/2018   12:48 PM  MMSE - Mini Mental State Exam  Orientation to time 4  Orientation to Place 5  Registration 3  Attention/ Calculation 3  Recall 2  Language- name 2 objects 2  Language- repeat 1  Language- follow 3 step command 3  Language- read & follow direction 1  Write a sentence 1  Copy design 1  Total score 26      12/09/2020    2:00 PM 08/19/2018    9:00 AM  [place]  Cognitive Assessment   Visuospatial/ Executive (0/5) 5 5  Naming (0/3) 3 3  Attention: Read list of digits (0/2) 2 0  Attention: Read list of letters (0/1) 1 1  Attention: Serial 7 subtraction starting at 100 (0/3) 0 1  Language: Repeat phrase (0/2) 1 0  Language : Fluency (0/1) 0 0  Abstraction (0/2) 2 0  Delayed Recall (0/5) 2 0  Orientation (0/6) 4 5  Total 20 15  Adjusted Score (based on education) 20 16      04/16/2022   11:44 AM  6CIT Screen  What Year? 4 points  What month? 3 points    Immunizations Immunization History  Administered Date(s) Administered   Fluad Quad(high Dose 65+) 10/19/2018, 12/05/2020   Influenza, High  Dose Seasonal PF 10/30/2016, 02/04/2018   PFIZER(Purple Top)SARS-COV-2 Vaccination 03/26/2019, 04/26/2019   Pneumococcal Conjugate-13 09/10/2016   Pneumococcal Polysaccharide-23 02/04/2018    TDAP status: Due, Education has been provided regarding the importance of this vaccine. Advised may receive this vaccine at local pharmacy or Health Dept. Aware to provide a copy of the vaccination record if obtained from local pharmacy or Health Dept. Verbalized acceptance and understanding.  Flu Vaccine status: Due, Education has been provided regarding the importance of this vaccine. Advised may receive this vaccine at local pharmacy or Health Dept. Aware to provide a copy of the vaccination record if obtained from local pharmacy or Health Dept. Verbalized acceptance and understanding.  Pneumococcal vaccine status: Up to date  Covid-19 vaccine status: Information provided on how to obtain vaccines.   Qualifies for Shingles Vaccine? Yes   Zostavax completed No   Shingrix Completed?: No.    Education has been provided regarding the importance of this vaccine. Patient has been advised to call insurance company to determine out of pocket expense if they have not yet received this vaccine. Advised may also receive vaccine at local pharmacy or Health Dept.  Verbalized acceptance and understanding.  Screening Tests Health Maintenance  Topic Date Due   INFLUENZA VACCINE  04/26/2022 (Originally 08/26/2021)   HEMOGLOBIN A1C  07/10/2022   Diabetic kidney evaluation - Urine ACR  01/09/2023   FOOT EXAM  01/09/2023   Diabetic kidney evaluation - eGFR measurement  02/11/2023   OPHTHALMOLOGY EXAM  02/17/2023   Medicare Annual Wellness (AWV)  04/16/2023   Pneumonia Vaccine 86+ Years old  Completed   Hepatitis C Screening  Completed   HPV VACCINES  Aged Out   DTaP/Tdap/Td  Discontinued   COLONOSCOPY (Pts 45-53yrs Insurance coverage will need to be confirmed)  Discontinued   COVID-19 Vaccine  Discontinued   Zoster Vaccines- Shingrix  Discontinued    Health Maintenance  There are no preventive care reminders to display for this patient.   Colorectal cancer screening: No longer required.   Lung Cancer Screening: (Low Dose CT Chest recommended if Age 66-80 years, 30 pack-year currently smoking OR have quit w/in 15years.) does qualify.   Lung Cancer Screening Referral:   Additional Screening:  Hepatitis C Screening: does not qualify; Completed 2018  Vision Screening: Recommended annual ophthalmology exams for early detection of glaucoma and other disorders of the eye. Is the patient up to date with their annual eye exam?  Yes  Who is the provider or what is the name of the office in which the patient attends annual eye exams? [facility] If pt is not established with a provider, would they like to be referred to a provider to establish care? No .   Dental Screening: Recommended annual dental exams for proper oral hygiene  Community Resource Referral / Chronic Care Management: CRR required this visit?  No   CCM required this visit?  No      Plan:     I have personally reviewed and noted the following in the patient's chart:   Medical and social history Use of alcohol, tobacco or illicit drugs  Current medications and supplements including  opioid prescriptions. Patient is not currently taking opioid prescriptions. Functional ability and status Nutritional status Physical activity Advanced directives List of other physicians Hospitalizations, surgeries, and ER visits in previous 12 months Vitals Screenings to include cognitive, depression, and falls Referrals and appointments  In addition, I have reviewed and discussed with patient certain preventive protocols, quality metrics, and  best practice recommendations. A written personalized care plan for preventive services as well as general preventive health recommendations were provided to patient.     Leroy Kennedy, LPN   [id number]   Nurse Notes:

## 2022-04-30 ENCOUNTER — Other Ambulatory Visit: Payer: Self-pay | Admitting: Cardiology

## 2022-05-13 ENCOUNTER — Other Ambulatory Visit: Payer: Self-pay

## 2022-05-13 DIAGNOSIS — I6523 Occlusion and stenosis of bilateral carotid arteries: Secondary | ICD-10-CM

## 2022-05-13 DIAGNOSIS — I739 Peripheral vascular disease, unspecified: Secondary | ICD-10-CM

## 2022-05-14 DIAGNOSIS — E785 Hyperlipidemia, unspecified: Secondary | ICD-10-CM | POA: Diagnosis not present

## 2022-05-14 DIAGNOSIS — I6523 Occlusion and stenosis of bilateral carotid arteries: Secondary | ICD-10-CM | POA: Diagnosis not present

## 2022-05-14 DIAGNOSIS — I739 Peripheral vascular disease, unspecified: Secondary | ICD-10-CM | POA: Diagnosis not present

## 2022-05-15 ENCOUNTER — Ambulatory Visit: Payer: Medicare HMO | Admitting: Cardiology

## 2022-05-15 LAB — CMP14+EGFR
ALT: 11 IU/L (ref 0–44)
AST: 13 IU/L (ref 0–40)
Albumin/Globulin Ratio: 1.8 (ref 1.2–2.2)
Albumin: 4.4 g/dL (ref 3.8–4.8)
Alkaline Phosphatase: 73 IU/L (ref 44–121)
BUN/Creatinine Ratio: 14 (ref 10–24)
BUN: 21 mg/dL (ref 8–27)
Bilirubin Total: 0.3 mg/dL (ref 0.0–1.2)
CO2: 23 mmol/L (ref 20–29)
Calcium: 9.4 mg/dL (ref 8.6–10.2)
Chloride: 104 mmol/L (ref 96–106)
Creatinine, Ser: 1.5 mg/dL — ABNORMAL HIGH (ref 0.76–1.27)
Globulin, Total: 2.4 g/dL (ref 1.5–4.5)
Glucose: 109 mg/dL — ABNORMAL HIGH (ref 70–99)
Potassium: 5.3 mmol/L — ABNORMAL HIGH (ref 3.5–5.2)
Sodium: 143 mmol/L (ref 134–144)
Total Protein: 6.8 g/dL (ref 6.0–8.5)
eGFR: 48 mL/min/{1.73_m2} — ABNORMAL LOW (ref >59)

## 2022-05-15 LAB — LIPID PANEL WITH LDL/HDL RATIO
Cholesterol, Total: 175 mg/dL (ref 100–199)
HDL: 43 mg/dL (ref >39)
LDL Chol Calc (NIH): 104 mg/dL — ABNORMAL HIGH (ref 0–99)
LDL/HDL Ratio: 2.4 ratio (ref 0.0–3.6)
Triglycerides: 157 mg/dL — ABNORMAL HIGH (ref 0–149)
VLDL Cholesterol Cal: 28 mg/dL (ref 5–40)

## 2022-05-15 LAB — LDL CHOLESTEROL, DIRECT: LDL Direct: 94 mg/dL (ref 0–99)

## 2022-05-19 ENCOUNTER — Encounter: Payer: Self-pay | Admitting: Cardiology

## 2022-05-19 ENCOUNTER — Ambulatory Visit: Payer: Medicare HMO | Admitting: Cardiology

## 2022-05-19 VITALS — BP 123/63 | HR 58 | Resp 16 | Ht 71.0 in | Wt 152.8 lb

## 2022-05-19 DIAGNOSIS — I480 Paroxysmal atrial fibrillation: Secondary | ICD-10-CM | POA: Diagnosis not present

## 2022-05-19 DIAGNOSIS — F17210 Nicotine dependence, cigarettes, uncomplicated: Secondary | ICD-10-CM

## 2022-05-19 DIAGNOSIS — I739 Peripheral vascular disease, unspecified: Secondary | ICD-10-CM | POA: Diagnosis not present

## 2022-05-19 DIAGNOSIS — R69 Illness, unspecified: Secondary | ICD-10-CM | POA: Diagnosis not present

## 2022-05-19 DIAGNOSIS — Z7901 Long term (current) use of anticoagulants: Secondary | ICD-10-CM | POA: Diagnosis not present

## 2022-05-19 DIAGNOSIS — E119 Type 2 diabetes mellitus without complications: Secondary | ICD-10-CM

## 2022-05-19 DIAGNOSIS — I6523 Occlusion and stenosis of bilateral carotid arteries: Secondary | ICD-10-CM

## 2022-05-19 DIAGNOSIS — I10 Essential (primary) hypertension: Secondary | ICD-10-CM

## 2022-05-19 MED ORDER — ROSUVASTATIN CALCIUM 40 MG PO TABS: 40.0000 mg | ORAL_TABLET | Freq: Every day | ORAL | 1 refills | Status: DC

## 2022-05-19 NOTE — Progress Notes (Signed)
Primary Physician/Referring:  Sheliah Hatch, MD Former cardiology providers: Elvin So, [place] Vascular surgery: Dr. Coral Else  Patient ID: John Woodward., male    DOB: 18-Nov-1945, 77 y.o.   MRN: [id number]  Date: 05/19/22 Last Office Visit: 02/13/2022  Chief Complaint  Patient presents with   Atrial Fibrillation   Follow-up    3 months   HPI:    John Woodward.  is a 77 y.o. with history of COPD, hypertension, hyperlipidemia, type II diabetes mellitus non-insulin dependent, paroxysmal atrial fibrillation, history of COVID-19 infection, peripheral artery disease, smoking.    Patient is being followed by the practice for management of atrial fibrillation, carotid disease and PAD.  He is accompanied by his wife John Woodward (would like to be addressed as Ginger).  Atrial fibrillation: Diagnosed in 2021.  Currently not on AV nodal blocking agents as his ventricular rate is well-controlled.  Currently on Eliquis for thromboembolic prophylaxis.  Patient's wife states that Eliquis is becoming cost prohibitive.  PAD: In the past we have discussed undergoing either CTA of the bilateral lower extremities or abdominal aortic runoff and Cath Lab to evaluate for obstructive disease.  However, in the past he has refused and clinically does not complain of claudication.  He is currently on medical therapy.  Patient did follow-up with vascular surgery who also recommends conservative management for now as he is asymptomatic.  Carotid disease: He has had a carotid duplex in the past which noted right ICA disease to be 60-79% stenosis and LICS 40-59% stenosis. Clinically denies any focal neurological deficits.  Has establish care with vascular surgery who plans to repeat a duplex in 6 months.  Continue current medical therapy   Past Medical History:  Diagnosis Date   Arthritis    Carotid artery disease    Closed fracture dislocation of right elbow    30 years ago -- fell out of  theback of a truck   Diabetes mellitus without complication    Hx of colonic polyps 02/23/2017   Hyperlipidemia    Hypertension    Diagnosed 10 years ago. No on medication. Denies history of CAD.    Memory loss    PAD (peripheral artery disease)    Past Surgical History:  Procedure Laterality Date   OLECRANON BURSECTOMY Right 08/18/2016   Procedure: OLECRANON BURSECTOMY;  Surgeon: Cammy Copa, MD;  Location: [facility] OR;  Service: Orthopedics;  Laterality: Right;   Family History  Problem Relation Age of Onset   Hypertension Mother    Dementia Mother        41s   Transient ischemic attack Mother        90s   Kidney disease Father    COPD Father    Hypertension Brother    Lung cancer Maternal Aunt    Alcohol abuse Maternal Grandfather    Mental illness Paternal Grandmother    Hypertension Daughter    Healthy Son    Learning disabilities Son    Colon cancer Neg Hx    Colon polyps Neg Hx    Esophageal cancer Neg Hx    Rectal cancer Neg Hx    Stomach cancer Neg Hx     Social History   Tobacco Use   Smoking status: Every Day    Packs/day: 0.25    Years: 50.00    Additional pack years: 0.00    Total pack years: 12.50    Types: Cigarettes    Start date: 08/12/1966   Smokeless  tobacco: Never   Tobacco comments:    Taking Nicotine lozenges  Substance Use Topics   Alcohol use: No   Marital Status: Married   ROS  Review of Systems  Cardiovascular:  Negative for chest pain, claudication, dyspnea on exertion, leg swelling, near-syncope, orthopnea, palpitations, paroxysmal nocturnal dyspnea and syncope.  Hematologic/Lymphatic:       No bleeding   Gastrointestinal:  Negative for melena.  Neurological:  Negative for dizziness.   Objective  Blood pressure 123/63, pulse (!) 58, resp. rate 16, height  (1.803 m), weight 152 lb 12.8 oz (69.3 kg), SpO2 98 %.     05/19/2022   10:31 AM 03/02/2022    2:12 PM 03/02/2022    2:10 PM  Vitals with BMI  Height      Weight 152 lbs 13 oz  153 lbs  BMI 21.32  21.35  Systolic 123 135 161  Diastolic 63 77 78  Pulse 58  59     Physical Exam Vitals reviewed.  Cardiovascular:     Rate and Rhythm: Normal rate and regular rhythm.     Pulses: No decreased pulses.          Carotid pulses are 2+ on the right side with bruit and 2+ on the left side.      Radial pulses are 2+ on the right side and 2+ on the left side.       Femoral pulses are 2+ on the right side and 1+ on the left side.      Popliteal pulses are 1+ on the right side and 1+ on the left side.       Dorsalis pedis pulses are 0 on the right side and 0 on the left side.       Posterior tibial pulses are 0 on the right side and 0 on the left side.     Heart sounds: S1 normal and S2 normal. No murmur heard.    No gallop.     Comments: Poor nail hygiene, no cyanosis or gangrene noted. Pulmonary:     Effort: Pulmonary effort is normal. No respiratory distress.     Breath sounds: No wheezing, rhonchi or rales.     Comments: Decreased breath sounds bilaterally.  Kyphosis of the thoracic spine Musculoskeletal:     Right lower leg: No edema.     Left lower leg: No edema.  Neurological:     Mental Status: He is alert.    Laboratory examination:   Recent Labs    01/27/22 1343 02/10/22 0936 05/14/22 0919  NA 139 142 143  K 4.6 4.5 5.3*  CL 101 103 104  CO2 GLUCOSE 96 114* 109*  BUN 28* 24 21  CREATININE 1.43 1.48* 1.50*  CALCIUM 9.5 9.2 9.4   estimated creatinine clearance is 41.1 mL/min (A) (by C-G formula based on SCr of 1.5 mg/dL (H)).     Latest Ref Rng & Units 05/14/2022    9:19 AM 02/10/2022    9:36 AM 01/27/2022    1:43 PM  CMP  Glucose 70 - 99 mg/dL 096  045  96   BUN 8 - 27 mg/dL Creatinine 0.76 - 1.27 mg/dL 4.09  8.11  9.14   Sodium 134 - 144 mmol/L 143  142  139   Potassium 3.5 - 5.2 mmol/L 5.3  4.5  4.6   Chloride 96 - 106 mmol/L 104  103  101  CO2 20 - 29 mmol/L Calcium 8.6 - 10.2  mg/dL 9.4  9.2  9.5   Total Protein 6.0 - 8.5 g/dL 6.8  6.5    Total Bilirubin 0.0 - 1.2 mg/dL 0.3  0.3    Alkaline Phos 44 - 121 IU/L 73  68    AST 0 - 40 IU/L 13  16    ALT 0 - 44 IU/L 11  13        Latest Ref Rng & Units 01/08/2022    1:15 PM 06/10/2021    9:46 AM 12/05/2020   11:30 AM  CBC  WBC 4.0 - 10.5 K/uL 5.8  4.9  4.9   Hemoglobin 13.0 - 17.0 g/dL 21.3  08.6  57.8   Hematocrit 39.0 - 52.0 % 42.5  44.4  44.5   Platelets 150.0 - 400.0 K/uL 258.0  260.0  233.0     Lipid Panel Recent Labs    06/10/21 0946 01/08/22 1315 02/10/22 0936 05/14/22 0919  CHOL 170 149 152 175  TRIG 243.0* 179.0* 160* 157*  LDLCALC  --  69 87 104*  VLDL 48.6* 35.8  --   --   HDL 41.30 44.10 37* 43  CHOLHDL 4 3  --   --   LDLDIRECT 88.0  --  82 94    HEMOGLOBIN A1C Lab Results  Component Value Date   HGBA1C 6.5 01/08/2022   MPG 131.24 12/03/2019   TSH Recent Labs    06/10/21 0946 01/08/22 1315  TSH 2.14 1.40    External labs:  None   Allergies   Allergies  Allergen Reactions   Antibacterial Hand Soap [Triclosan] Other (See Comments)    Caused water blisters   Medications Prior to Visit:   Outpatient Medications Prior to Visit  Medication Sig Dispense Refill   amLODipine (NORVASC) 10 MG tablet TAKE 1 TABLET BY MOUTH EVERY DAY 90 tablet 1   ELIQUIS 5 MG TABS tablet TAKE ONE TABLET BY MOUTH TWICE DAILY 180 tablet 1   escitalopram (LEXAPRO) 20 MG tablet Take 1 tablet (20 mg total) by mouth daily. 90 tablet 3   hydrALAZINE (APRESOLINE) 50 MG tablet Take 1 tablet (50 mg total) by mouth 3 (three) times daily. 270 tablet 2   hydrochlorothiazide (MICROZIDE) 12.5 MG capsule TAKE ONE CAPSULE BY MOUTH DAILY 90 capsule 3   metFORMIN (GLUCOPHAGE) 500 MG tablet Take one tablet every morning and take two tablets by mouth every evening (Patient taking differently: Take 500 mg by mouth daily with breakfast. Take one tablet every morning and take two tablets by mouth every evening) 270  tablet 2   nicotine polacrilex (COMMIT) 4 MG lozenge Take 1 lozenge (4 mg total) by mouth as needed for up to 90 doses for smoking cessation. 90 tablet 0   telmisartan (MICARDIS) 80 MG tablet TAKE ONE TABLET BY MOUTH ONCE DAILY 90 tablet 3   rosuvastatin (CRESTOR) 20 MG tablet Take 1 tablet (20 mg total) by mouth at bedtime. 90 tablet 0   No facility-administered medications prior to visit.   Final Medications at End of Visit    Current Meds  Medication Sig   amLODipine (NORVASC) 10 MG tablet TAKE 1 TABLET BY MOUTH EVERY DAY   ELIQUIS 5 MG TABS tablet TAKE ONE TABLET BY MOUTH TWICE DAILY   escitalopram (LEXAPRO) 20 MG tablet Take 1 tablet (20 mg total) by mouth daily.   hydrALAZINE (APRESOLINE) 50 MG tablet Take 1 tablet (  50 mg total) by mouth 3 (three) times daily.   hydrochlorothiazide (MICROZIDE) 12.5 MG capsule TAKE ONE CAPSULE BY MOUTH DAILY   metFORMIN (GLUCOPHAGE) 500 MG tablet Take one tablet every morning and take two tablets by mouth every evening (Patient taking differently: Take 500 mg by mouth daily with breakfast. Take one tablet every morning and take two tablets by mouth every evening)   nicotine polacrilex (COMMIT) 4 MG lozenge Take 1 lozenge (4 mg total) by mouth as needed for up to 90 doses for smoking cessation.   telmisartan (MICARDIS) 80 MG tablet TAKE ONE TABLET BY MOUTH ONCE DAILY   Radiology:   No results found.  Cardiac Studies:  Lower Extremity Arterial Duplex 07/08/2021:  There is severe diffuse calcific plaque through out the bilateral lower  extremity.  Moderate velocity increase at the right mid superficial femoral artery suggests hemodynamically significant stenosis of >50% with monophasic waveform below the stenosis.  Dampened monophasic waveform in the left EIA and CFA suggests hemodynamically significant stenosis in the proximal vessel (CIA).  The left SFA is occluded in the proximal to mid segment with collateral reconstitution into the distal SFA.   This exam reveals mildly decreased perfusion of the right lower extremity, noted at the post tibial artery level (ABI 0.8) with severely abnormal monophasic waveform at the right ankle.  This exam reveals moderately decreased perfusion of the left lower extremity  Carotid artery duplex 07/08/2021:  Duplex suggests stenosis in the right internal carotid artery (50-69%).  Duplex suggests stenosis in the left internal carotid artery (50-69%).  Duplex suggests stenosis in the left external carotid artery (<50%).  Antegrade right vertebral artery flow. Antegrade left vertebral artery  flow with resistant flow and may suggest distal stenosis.  No significant change since 12/06/2020. Follow up in six months is  appropriate if clinically indicated.  Carotid Duplex 02/02/2022 Right Carotid: Velocities in the right ICA are consistent with a 60-79% stenosis.  Left Carotid: Velocities in the left ICA are consistent with a 40-59% stenosis.  Vertebrals: Bilateral vertebral arteries demonstrate antegrade flow. Subclavians: Normal flow hemodynamics were seen in bilateral subclavian arteries.   ABI 12/29/2019:  This exam reveals moderately decreased perfusion of the right lower extremity, noted at the post tibial artery level (ABI 0.51) and severely decreased perfusion of the left lower extremity, noted at the dorsalis pedis and post tibial artery level (ABI 0.32).  Right AT appears to be occluded with dampened waveform. There is severely abnormal monophasic waveform of all vessels at the level of the ankle.  PCV MYOCARDIAL PERFUSION WO LEXISCAN 12/27/2019 Nondiagnostic ECG stress due to pharmacologic stress. Resting EKG demonstrated normal sinus rhythm. Non-specific T-wave abnormalities. Peak EKG revealed no significant ST-T changes. Myocardial perfusion is normal. Overall LV systolic function is normal without regional wall motion abnormalities. Stress LV EF: 58%. No previous exam available for  comparison. Low risk.  Echocardiogram 12/04/2019:  1. Patient in afib during exam.   2. Left ventricular ejection fraction, by estimation, is 60 to 65%. The left ventricle has normal function. The left ventricle has no regional wall motion abnormalities. Left ventricular diastolic parameters are indeterminate.   3. Right ventricular systolic function is normal. The right ventricular size is normal.   4. Left atrial size was mildly dilated.   5. The mitral valve is degenerative. Trivial mitral valve regurgitation. No evidence of mitral stenosis.   6. The aortic valve was not well visualized. Aortic valve regurgitation is not visualized. Mild to moderate aortic valve  sclerosis/calcification is present, without any evidence of aortic stenosis.   7. The inferior vena cava is normal in size with greater than 50% respiratory variability, suggesting right atrial pressure of 3 mmHg.  EKG:  12/04/2019: Atrial fibrillation with rapid ventricular response at a rate of 107 bpm. Normal axis, left atrial. Enlargement.   05/19/2022: Sinus bradycardia, 53 bpm, ST changes in the inferior leads consider inferior ischemia, without underlying injury pattern.  Assessment     ICD-10-CM   1. Paroxysmal atrial fibrillation  I48.0 EKG 12-Lead    Hemoglobin and hematocrit, blood    Basic metabolic panel    2. Long term (current) use of anticoagulants  Z79.01     3. Bilateral carotid artery stenosis  I65.23 rosuvastatin (CRESTOR) 40 MG tablet    4. Essential hypertension  I10     5. Peripheral arterial disease  I73.9 rosuvastatin (CRESTOR) 40 MG tablet    6. Non-insulin dependent type 2 diabetes mellitus  E11.9     7. Continuous dependence on cigarette smoking  F17.210      Medications Discontinued During This Encounter  Medication Reason   rosuvastatin (CRESTOR) 20 MG tablet      Meds ordered this encounter  Medications   rosuvastatin (CRESTOR) 40 MG tablet    Sig: Take 1 tablet (40 mg total) by mouth  at bedtime.    Dispense:  90 tablet    Refill:  1   This patients CHA2DS2-VASc Score 5 (HTN, DM, Age, PAD) and yearly risk of stroke 7.2%.  Recommendations:   John Whetzel. is a 77 y.o. with history of COPD, hypertension, hyperlipidemia, type II diabetes mellitus non-insulin dependent, paroxysmal atrial fibrillation, history of COVID-19 infection, peripheral artery disease, smoking.    Paroxysmal atrial fibrillation Long term (current) use of anticoagulants Diagnosed in 2021, November. Rate control: N/A, ventricular rate already well-controlled. Rhythm control: None Thromboembolic prophylaxis: Eliquis EKG: Sinus. Most recent labs from December 2023 independently reviewed.  No new labs available to reevaluate hemoglobin and kidney function Patient does not endorse any evidence of bleeding. Patient states that Eliquis is becoming cost prohibitive. Discussed the risks, benefits, alternatives to anticoagulation. I have advised them to reach out to the insurance company to see if particular anticoagulant is on their formulary.  In the meantime we will also apply for patient assistance to help mitigate the cost. Patient/wife will call us back once they know more. Click Here to Calculate/Change CHADS2VASc Score The patient's CHADS2-VASc score is 5, indicating a 7.2% annual risk of stroke.   CHF History: No HTN History: Yes Diabetes History: Yes Stroke History: No Vascular Disease History: Yes  Bilateral carotid artery stenosis Asymptomatic. Right ICA stenosis greater than left ICA stenosis based on last carotid duplex. Has establish care with vascular surgery who will take over care.  He was recommended to have a repeat carotid duplex in 6 months. Reemphasized importance of lipid management and complete smoking cessation.  Essential hypertension Office blood pressures are well-controlled. No changes warranted at this time.  Peripheral arterial disease Denies  claudication. Also evaluated by vascular surgery who recommends conservative management for now. Educated him on the importance of lipid management, complete smoking cessation, and ambulating at least 30 minutes a day 5 days a week as tolerated.  Non-insulin dependent type 2 diabetes mellitus Currently on ARB, statin therapy, metformin. Patient has an upcoming appointment with PCP-recommend discussing initiation of Jardiance or Marcelline Deist given his comorbid conditions.  Tobacco abuse disorder Tobacco cessation counseling: Currently smoking  1 pack / week. Did not get nicotine patches or lozenges due to it being cost prohibitive.  However, he continues to smoke cigarettes. Discussed the importance of prioritizing health versus leisure. Patient was informed of the dangers of tobacco abuse including stroke, cancer, and MI, as well as benefits of tobacco cessation. 9 mins were spent counseling patient cessation techniques. We discussed various methods to help quit smoking, including deciding on a date to quit, joining a support group, pharmacological agents- nicotine gum/patch/lozenges.  I will reassess his progress at the next follow-up visit   Orders Placed This Encounter  Procedures   Hemoglobin and hematocrit, blood   Basic metabolic panel   EKG 12-Lead   --Continue cardiac medications as reconciled in final medication list. --Return in about 6 months (around 11/18/2022) for Follow up, A. fib. Or sooner if needed. --Continue follow-up with your primary care physician regarding the management of your other chronic comorbid conditions.  Patient's questions and concerns were addressed to his satisfaction. He voices understanding of the instructions provided during this encounter.   This note was created using a voice recognition software as a result there may be grammatical errors inadvertently enclosed that do not reflect the nature of this encounter. Every attempt is made to correct such  errors.  Tessa Lerner, [place], [facility]  Pager:  [phone] Office: [phone]

## 2022-05-21 ENCOUNTER — Ambulatory Visit: Payer: Medicare HMO | Admitting: Podiatry

## 2022-05-27 ENCOUNTER — Other Ambulatory Visit: Payer: Self-pay

## 2022-05-27 MED ORDER — HYDROCHLOROTHIAZIDE 12.5 MG PO CAPS: 12.5000 mg | ORAL_CAPSULE | Freq: Every day | ORAL | 3 refills | Status: DC

## 2022-05-27 MED ORDER — TELMISARTAN 80 MG PO TABS: 80.0000 mg | ORAL_TABLET | Freq: Every day | ORAL | 3 refills | Status: DC

## 2022-06-29 ENCOUNTER — Other Ambulatory Visit: Payer: Self-pay

## 2022-06-29 ENCOUNTER — Emergency Department (HOSPITAL_COMMUNITY): Payer: Medicare HMO

## 2022-06-29 ENCOUNTER — Inpatient Hospital Stay (HOSPITAL_COMMUNITY)
Admission: EM | Admit: 2022-06-29 | Discharge: 2022-07-01 | DRG: 177 | Disposition: A | Discharge status: Home / Self Care | Payer: Medicare HMO | Attending: Internal Medicine | Admitting: Internal Medicine

## 2022-06-29 ENCOUNTER — Ambulatory Visit: Payer: Medicare HMO | Admitting: Family Medicine

## 2022-06-29 ENCOUNTER — Encounter (HOSPITAL_COMMUNITY): Payer: Self-pay

## 2022-06-29 DIAGNOSIS — E1169 Type 2 diabetes mellitus with other specified complication: Secondary | ICD-10-CM | POA: Diagnosis present

## 2022-06-29 DIAGNOSIS — F339 Major depressive disorder, recurrent, unspecified: Secondary | ICD-10-CM | POA: Diagnosis present

## 2022-06-29 DIAGNOSIS — K8080 Other cholelithiasis without obstruction: Secondary | ICD-10-CM

## 2022-06-29 DIAGNOSIS — K5732 Diverticulitis of large intestine without perforation or abscess without bleeding: Secondary | ICD-10-CM | POA: Diagnosis not present

## 2022-06-29 DIAGNOSIS — K5730 Diverticulosis of large intestine without perforation or abscess without bleeding: Secondary | ICD-10-CM | POA: Diagnosis not present

## 2022-06-29 DIAGNOSIS — E861 Hypovolemia: Secondary | ICD-10-CM | POA: Diagnosis not present

## 2022-06-29 DIAGNOSIS — Z720 Tobacco use: Secondary | ICD-10-CM | POA: Diagnosis not present

## 2022-06-29 DIAGNOSIS — F17210 Nicotine dependence, cigarettes, uncomplicated: Secondary | ICD-10-CM | POA: Diagnosis present

## 2022-06-29 DIAGNOSIS — Z79899 Other long term (current) drug therapy: Secondary | ICD-10-CM

## 2022-06-29 DIAGNOSIS — R0902 Hypoxemia: Secondary | ICD-10-CM | POA: Diagnosis present

## 2022-06-29 DIAGNOSIS — R197 Diarrhea, unspecified: Secondary | ICD-10-CM

## 2022-06-29 DIAGNOSIS — K5792 Diverticulitis of intestine, part unspecified, without perforation or abscess without bleeding: Secondary | ICD-10-CM | POA: Diagnosis not present

## 2022-06-29 DIAGNOSIS — I129 Hypertensive chronic kidney disease with stage 1 through stage 4 chronic kidney disease, or unspecified chronic kidney disease: Secondary | ICD-10-CM | POA: Diagnosis present

## 2022-06-29 DIAGNOSIS — U071 COVID-19: Secondary | ICD-10-CM | POA: Diagnosis not present

## 2022-06-29 DIAGNOSIS — J1282 Pneumonia due to coronavirus disease 2019: Secondary | ICD-10-CM | POA: Diagnosis present

## 2022-06-29 DIAGNOSIS — J440 Chronic obstructive pulmonary disease with acute lower respiratory infection: Secondary | ICD-10-CM | POA: Diagnosis not present

## 2022-06-29 DIAGNOSIS — Z8673 Personal history of transient ischemic attack (TIA), and cerebral infarction without residual deficits: Secondary | ICD-10-CM

## 2022-06-29 DIAGNOSIS — Z818 Family history of other mental and behavioral disorders: Secondary | ICD-10-CM

## 2022-06-29 DIAGNOSIS — Z91048 Other nonmedicinal substance allergy status: Secondary | ICD-10-CM | POA: Diagnosis not present

## 2022-06-29 DIAGNOSIS — E876 Hypokalemia: Secondary | ICD-10-CM | POA: Diagnosis not present

## 2022-06-29 DIAGNOSIS — J449 Chronic obstructive pulmonary disease, unspecified: Secondary | ICD-10-CM | POA: Diagnosis not present

## 2022-06-29 DIAGNOSIS — E1122 Type 2 diabetes mellitus with diabetic chronic kidney disease: Secondary | ICD-10-CM | POA: Diagnosis present

## 2022-06-29 DIAGNOSIS — Z841 Family history of disorders of kidney and ureter: Secondary | ICD-10-CM

## 2022-06-29 DIAGNOSIS — E785 Hyperlipidemia, unspecified: Secondary | ICD-10-CM | POA: Diagnosis not present

## 2022-06-29 DIAGNOSIS — R059 Cough, unspecified: Secondary | ICD-10-CM | POA: Diagnosis not present

## 2022-06-29 DIAGNOSIS — N1832 Chronic kidney disease, stage 3b: Secondary | ICD-10-CM | POA: Diagnosis not present

## 2022-06-29 DIAGNOSIS — Z801 Family history of malignant neoplasm of trachea, bronchus and lung: Secondary | ICD-10-CM

## 2022-06-29 DIAGNOSIS — Z7901 Long term (current) use of anticoagulants: Secondary | ICD-10-CM

## 2022-06-29 DIAGNOSIS — R000 Tachycardia, unspecified: Secondary | ICD-10-CM | POA: Diagnosis not present

## 2022-06-29 DIAGNOSIS — N179 Acute kidney failure, unspecified: Secondary | ICD-10-CM | POA: Diagnosis not present

## 2022-06-29 DIAGNOSIS — R1110 Vomiting, unspecified: Secondary | ICD-10-CM | POA: Diagnosis not present

## 2022-06-29 DIAGNOSIS — I7143 Infrarenal abdominal aortic aneurysm, without rupture: Secondary | ICD-10-CM | POA: Diagnosis present

## 2022-06-29 DIAGNOSIS — K8020 Calculus of gallbladder without cholecystitis without obstruction: Secondary | ICD-10-CM | POA: Diagnosis not present

## 2022-06-29 DIAGNOSIS — I700 Atherosclerosis of aorta: Secondary | ICD-10-CM | POA: Diagnosis not present

## 2022-06-29 DIAGNOSIS — I959 Hypotension, unspecified: Secondary | ICD-10-CM | POA: Diagnosis present

## 2022-06-29 DIAGNOSIS — E119 Type 2 diabetes mellitus without complications: Secondary | ICD-10-CM

## 2022-06-29 DIAGNOSIS — Z823 Family history of stroke: Secondary | ICD-10-CM

## 2022-06-29 DIAGNOSIS — J439 Emphysema, unspecified: Secondary | ICD-10-CM | POA: Diagnosis present

## 2022-06-29 DIAGNOSIS — F32A Depression, unspecified: Secondary | ICD-10-CM | POA: Diagnosis not present

## 2022-06-29 DIAGNOSIS — Z8249 Family history of ischemic heart disease and other diseases of the circulatory system: Secondary | ICD-10-CM | POA: Diagnosis not present

## 2022-06-29 DIAGNOSIS — E1151 Type 2 diabetes mellitus with diabetic peripheral angiopathy without gangrene: Secondary | ICD-10-CM | POA: Diagnosis present

## 2022-06-29 DIAGNOSIS — Z7984 Long term (current) use of oral hypoglycemic drugs: Secondary | ICD-10-CM

## 2022-06-29 DIAGNOSIS — Z811 Family history of alcohol abuse and dependence: Secondary | ICD-10-CM

## 2022-06-29 DIAGNOSIS — Z8616 Personal history of COVID-19: Secondary | ICD-10-CM | POA: Diagnosis not present

## 2022-06-29 DIAGNOSIS — Z743 Need for continuous supervision: Secondary | ICD-10-CM | POA: Diagnosis not present

## 2022-06-29 DIAGNOSIS — Z825 Family history of asthma and other chronic lower respiratory diseases: Secondary | ICD-10-CM

## 2022-06-29 DIAGNOSIS — I480 Paroxysmal atrial fibrillation: Secondary | ICD-10-CM | POA: Diagnosis not present

## 2022-06-29 DIAGNOSIS — R410 Disorientation, unspecified: Secondary | ICD-10-CM | POA: Diagnosis not present

## 2022-06-29 DIAGNOSIS — N189 Chronic kidney disease, unspecified: Secondary | ICD-10-CM | POA: Diagnosis present

## 2022-06-29 DIAGNOSIS — Z820 Family history of epilepsy and other diseases of the nervous system: Secondary | ICD-10-CM

## 2022-06-29 LAB — TROPONIN I (HIGH SENSITIVITY)
Troponin I (High Sensitivity): 24 ng/L — ABNORMAL HIGH (ref <18)
Troponin I (High Sensitivity): 17 ng/L (ref <18)

## 2022-06-29 LAB — CBC WITH DIFFERENTIAL/PLATELET
Abs Immature Granulocytes: 0.02 10*3/uL (ref 0.00–0.07)
Basophils Absolute: 0 10*3/uL (ref 0.0–0.1)
Basophils Relative: 0 %
Eosinophils Absolute: 0 10*3/uL (ref 0.0–0.5)
Eosinophils Relative: 0 %
HCT: 44 % (ref 39.0–52.0)
Hemoglobin: 14.9 g/dL (ref 13.0–17.0)
Immature Granulocytes: 0 %
Lymphocytes Relative: 20 %
Lymphs Abs: 1.1 10*3/uL (ref 0.7–4.0)
MCH: 31.9 pg (ref 26.0–34.0)
MCHC: 33.9 g/dL (ref 30.0–36.0)
MCV: 94.2 fL (ref 80.0–100.0)
Monocytes Absolute: 0.5 10*3/uL (ref 0.1–1.0)
Monocytes Relative: 10 %
Neutro Abs: 3.6 10*3/uL (ref 1.7–7.7)
Neutrophils Relative %: 70 %
Platelets: 183 10*3/uL (ref 150–400)
RBC: 4.67 MIL/uL (ref 4.22–5.81)
RDW: 12.8 % (ref 11.5–15.5)
WBC: 5.2 10*3/uL (ref 4.0–10.5)
nRBC: 0 % (ref 0.0–0.2)

## 2022-06-29 LAB — BASIC METABOLIC PANEL
Anion gap: 9 (ref 5–15)
BUN: 43 mg/dL — ABNORMAL HIGH (ref 8–23)
CO2: 23 mmol/L (ref 22–32)
Calcium: 7.4 mg/dL — ABNORMAL LOW (ref 8.9–10.3)
Chloride: 107 mmol/L (ref 98–111)
Creatinine, Ser: 2.04 mg/dL — ABNORMAL HIGH (ref 0.61–1.24)
GFR, Estimated: 33 mL/min — ABNORMAL LOW (ref >60)
Glucose, Bld: 114 mg/dL — ABNORMAL HIGH (ref 70–99)
Potassium: 3.2 mmol/L — ABNORMAL LOW (ref 3.5–5.1)
Sodium: 139 mmol/L (ref 135–145)

## 2022-06-29 LAB — COMPREHENSIVE METABOLIC PANEL
ALT: 14 U/L (ref 0–44)
AST: 19 U/L (ref 15–41)
Albumin: 3.3 g/dL — ABNORMAL LOW (ref 3.5–5.0)
Alkaline Phosphatase: 62 U/L (ref 38–126)
Anion gap: 12 (ref 5–15)
BUN: 45 mg/dL — ABNORMAL HIGH (ref 8–23)
CO2: 24 mmol/L (ref 22–32)
Calcium: 8.2 mg/dL — ABNORMAL LOW (ref 8.9–10.3)
Chloride: 103 mmol/L (ref 98–111)
Creatinine, Ser: 2.36 mg/dL — ABNORMAL HIGH (ref 0.61–1.24)
GFR, Estimated: 28 mL/min — ABNORMAL LOW (ref >60)
Glucose, Bld: 152 mg/dL — ABNORMAL HIGH (ref 70–99)
Potassium: 3.3 mmol/L — ABNORMAL LOW (ref 3.5–5.1)
Sodium: 139 mmol/L (ref 135–145)
Total Bilirubin: 0.8 mg/dL (ref 0.3–1.2)
Total Protein: 6.7 g/dL (ref 6.5–8.1)

## 2022-06-29 LAB — BRAIN NATRIURETIC PEPTIDE: B Natriuretic Peptide: 98.6 pg/mL (ref 0.0–100.0)

## 2022-06-29 LAB — LIPASE, BLOOD: Lipase: 69 U/L — ABNORMAL HIGH (ref 11–51)

## 2022-06-29 LAB — LACTIC ACID, PLASMA: Lactic Acid, Venous: 1.6 mmol/L (ref 0.5–1.9)

## 2022-06-29 LAB — GLUCOSE, CAPILLARY: Glucose-Capillary: 199 mg/dL — ABNORMAL HIGH (ref 70–99)

## 2022-06-29 LAB — PROCALCITONIN: Procalcitonin: 0.12 ng/mL

## 2022-06-29 MED ORDER — INSULIN ASPART 100 UNIT/ML IJ SOLN: 0.0000 [IU] | Freq: Every day | INTRAMUSCULAR | Status: DC

## 2022-06-29 MED ORDER — METHYLPREDNISOLONE SODIUM SUCC 125 MG IJ SOLR
62.5000 mg | Freq: Once | INTRAMUSCULAR | Status: AC
  Administered 2022-06-29: 62.5 mg via INTRAVENOUS
  Filled 2022-06-29: qty 2

## 2022-06-29 MED ORDER — ONDANSETRON HCL 4 MG PO TABS: 4.0000 mg | ORAL_TABLET | Freq: Four times a day (QID) | ORAL | Status: DC | PRN

## 2022-06-29 MED ORDER — APIXABAN 5 MG PO TABS
5.0000 mg | ORAL_TABLET | Freq: Two times a day (BID) | ORAL | Status: DC
  Administered 2022-06-29: 5 mg via ORAL
  Filled 2022-06-29: qty 1

## 2022-06-29 MED ORDER — LACTATED RINGERS IV SOLN: INTRAVENOUS | Status: AC

## 2022-06-29 MED ORDER — METRONIDAZOLE 500 MG/100ML IV SOLN
500.0000 mg | Freq: Once | INTRAVENOUS | Status: AC
  Administered 2022-06-29: 500 mg via INTRAVENOUS
  Filled 2022-06-29: qty 100

## 2022-06-29 MED ORDER — IPRATROPIUM-ALBUTEROL 0.5-2.5 (3) MG/3ML IN SOLN
3.0000 mL | Freq: Once | RESPIRATORY_TRACT | Status: AC
  Administered 2022-06-29: 3 mL via RESPIRATORY_TRACT
  Filled 2022-06-29: qty 3

## 2022-06-29 MED ORDER — SODIUM CHLORIDE 0.9 % IV SOLN
2.0000 g | INTRAVENOUS | Status: DC
  Administered 2022-06-30: 2 g via INTRAVENOUS
  Filled 2022-06-29: qty 20

## 2022-06-29 MED ORDER — IOHEXOL 350 MG/ML SOLN
65.0000 mL | Freq: Once | INTRAVENOUS | Status: AC | PRN
  Administered 2022-06-29: 65 mL via INTRAVENOUS

## 2022-06-29 MED ORDER — SODIUM CHLORIDE 0.9 % IV SOLN
500.0000 mg | INTRAVENOUS | Status: DC
  Administered 2022-06-29: 500 mg via INTRAVENOUS
  Filled 2022-06-29: qty 5

## 2022-06-29 MED ORDER — METRONIDAZOLE 500 MG/100ML IV SOLN: 500.0000 mg | Freq: Two times a day (BID) | INTRAVENOUS | Status: DC

## 2022-06-29 MED ORDER — SACCHAROMYCES BOULARDII 250 MG PO CAPS
250.0000 mg | ORAL_CAPSULE | Freq: Two times a day (BID) | ORAL | Status: DC
  Administered 2022-06-29 – 2022-07-01 (×4): 250 mg via ORAL
  Filled 2022-06-29 (×5): qty 1

## 2022-06-29 MED ORDER — METHYLPREDNISOLONE SODIUM SUCC 40 MG IJ SOLR
0.5000 mg/kg | Freq: Two times a day (BID) | INTRAMUSCULAR | Status: DC
  Administered 2022-06-29 – 2022-06-30 (×3): 34 mg via INTRAVENOUS
  Filled 2022-06-29 (×3): qty 1

## 2022-06-29 MED ORDER — MAGNESIUM SULFATE IN D5W 1-5 GM/100ML-% IV SOLN
1.0000 g | Freq: Once | INTRAVENOUS | Status: AC
  Administered 2022-06-29: 1 g via INTRAVENOUS
  Filled 2022-06-29: qty 100

## 2022-06-29 MED ORDER — SODIUM CHLORIDE 0.9 % IV BOLUS
500.0000 mL | Freq: Once | INTRAVENOUS | Status: AC
  Administered 2022-06-29: 500 mL via INTRAVENOUS

## 2022-06-29 MED ORDER — SODIUM CHLORIDE 0.9 % IV SOLN
2.0000 g | Freq: Once | INTRAVENOUS | Status: AC
  Administered 2022-06-29: 2 g via INTRAVENOUS
  Filled 2022-06-29: qty 20

## 2022-06-29 MED ORDER — ENOXAPARIN SODIUM 40 MG/0.4ML IJ SOSY
40.0000 mg | PREFILLED_SYRINGE | INTRAMUSCULAR | Status: DC
  Administered 2022-06-29: 40 mg via SUBCUTANEOUS
  Filled 2022-06-29: qty 0.4

## 2022-06-29 MED ORDER — SODIUM CHLORIDE 0.9 % IV BOLUS
1000.0000 mL | Freq: Once | INTRAVENOUS | Status: AC
  Administered 2022-06-29: 1000 mL via INTRAVENOUS

## 2022-06-29 MED ORDER — ALBUTEROL SULFATE (2.5 MG/3ML) 0.083% IN NEBU: 2.5000 mg | INHALATION_SOLUTION | Freq: Four times a day (QID) | RESPIRATORY_TRACT | Status: DC | PRN

## 2022-06-29 MED ORDER — ACETAMINOPHEN 650 MG RE SUPP: 650.0000 mg | Freq: Four times a day (QID) | RECTAL | Status: DC | PRN

## 2022-06-29 MED ORDER — ONDANSETRON HCL 4 MG/2ML IJ SOLN: 4.0000 mg | Freq: Four times a day (QID) | INTRAMUSCULAR | Status: DC | PRN

## 2022-06-29 MED ORDER — PREDNISONE 5 MG PO TABS: 50.0000 mg | ORAL_TABLET | Freq: Every day | ORAL | Status: DC

## 2022-06-29 MED ORDER — ROSUVASTATIN CALCIUM 20 MG PO TABS
40.0000 mg | ORAL_TABLET | Freq: Every day | ORAL | Status: DC
  Administered 2022-06-29 – 2022-06-30 (×2): 40 mg via ORAL
  Filled 2022-06-29: qty 8
  Filled 2022-06-29: qty 2

## 2022-06-29 MED ORDER — ESCITALOPRAM OXALATE 20 MG PO TABS
20.0000 mg | ORAL_TABLET | Freq: Every day | ORAL | Status: DC
  Administered 2022-06-29 – 2022-07-01 (×3): 20 mg via ORAL
  Filled 2022-06-29 (×3): qty 1

## 2022-06-29 MED ORDER — LACTATED RINGERS IV BOLUS
500.0000 mL | Freq: Once | INTRAVENOUS | Status: AC
  Administered 2022-06-29: 500 mL via INTRAVENOUS

## 2022-06-29 MED ORDER — CALCIUM GLUCONATE-NACL 2-0.675 GM/100ML-% IV SOLN
2.0000 g | Freq: Once | INTRAVENOUS | Status: AC
  Administered 2022-06-29: 2000 mg via INTRAVENOUS
  Filled 2022-06-29 (×2): qty 100

## 2022-06-29 MED ORDER — ACETAMINOPHEN 325 MG PO TABS: 650.0000 mg | ORAL_TABLET | Freq: Four times a day (QID) | ORAL | Status: DC | PRN

## 2022-06-29 MED ORDER — SODIUM CHLORIDE 0.9% FLUSH
3.0000 mL | Freq: Two times a day (BID) | INTRAVENOUS | Status: DC
  Administered 2022-06-29 – 2022-07-01 (×3): 3 mL via INTRAVENOUS

## 2022-06-29 MED ORDER — INSULIN ASPART 100 UNIT/ML IJ SOLN
0.0000 [IU] | Freq: Three times a day (TID) | INTRAMUSCULAR | Status: DC
  Administered 2022-06-30: 3 [IU] via SUBCUTANEOUS
  Administered 2022-06-30 – 2022-07-01 (×3): 2 [IU] via SUBCUTANEOUS

## 2022-06-29 MED ORDER — NICOTINE POLACRILEX 4 MG MT LOZG: 4.0000 mg | LOZENGE | OROMUCOSAL | Status: DC | PRN

## 2022-06-29 MED ORDER — POTASSIUM CHLORIDE CRYS ER 20 MEQ PO TBCR
60.0000 meq | EXTENDED_RELEASE_TABLET | Freq: Once | ORAL | Status: AC
  Administered 2022-06-29: 60 meq via ORAL
  Filled 2022-06-29: qty 3

## 2022-06-29 NOTE — ED Notes (Signed)
MD aware of patient's BP.

## 2022-06-29 NOTE — ED Notes (Signed)
ED TO INPATIENT HANDOFF REPORT  ED Nurse Name and Phone #: Morrie Sheldon RN [phone]  S Name/Age/Gender John Woodward. 77 y.o. male Room/Bed: 021C/021C  Code Status   Code Status: Full Code  Home/SNF/Other Home Patient oriented to: self, place, and situation Is this baseline? Yes   Triage Complete: Triage complete  Chief Complaint Diverticulitis [K57.92]  Triage Note Pt BIBA from home. A&Ox 3 at baseline. COVID+ Friday. Since then, pt has had decreased intake and diarrhea. Pt has nonproductive cough. Pt states he has thrown up " a little bit"  86/54 500 fluid 130 systolic after  Denies CP, fever  HR 120 - hx a fib   Allergies Allergies  Allergen Reactions   Antibacterial Hand Soap [Triclosan] Other (See Comments)    Caused water blisters    Level of Care/Admitting Diagnosis ED Disposition     ED Disposition  Admit   Condition  --   Comment  Hospital Area: [facility] [[id number]]  Level of Care: Telemetry Medical [104]  May admit patient to [facility] or [facility] if equivalent level of care is available:: No  Covid Evaluation: Asymptomatic - no recent exposure (last 10 days) testing not required  Diagnosis: Diverticulitis [[id number]]  Admitting Physician: Clydie Braun [[id number]]  Attending Physician: Clydie Braun [[id number]]  Certification:: I certify this patient will need inpatient services for at least 2 midnights  Estimated Length of Stay: 2          B Medical/Surgery History Past Medical History:  Diagnosis Date   Arthritis    Carotid artery disease (HCC)    Closed fracture dislocation of right elbow    30 years ago -- fell out of theback of a truck   Diabetes mellitus without complication (HCC)    Hx of colonic polyps 02/23/2017   Hyperlipidemia    Hypertension    Diagnosed 10 years ago. No on medication. Denies history of CAD.    Memory loss    PAD (peripheral artery disease) [facility])    Past Surgical History:   Procedure Laterality Date   OLECRANON BURSECTOMY Right 08/18/2016   Procedure: OLECRANON BURSECTOMY;  Surgeon: Cammy Copa, MD;  Location: [facility] OR;  Service: Orthopedics;  Laterality: Right;     A IV Location/Drains/Wounds Patient Lines/Drains/Airways Status     Active Line/Drains/Airways     Name Placement date Placement time Site Days   Peripheral IV 06/29/22 22 G Anterior;Left Hand 06/29/22  --  Hand  less than 1   Peripheral IV 06/29/22 20 G Left Antecubital 06/29/22  1020  Antecubital  less than 1   Peripheral IV 06/29/22 20 G Anterior;Proximal;Right Forearm 06/29/22  1630  Forearm  less than 1            Intake/Output Last 24 hours  Intake/Output Summary (Last 24 hours) at 06/29/2022 1719 Last data filed at 06/29/2022 1611 Gross per 24 hour  Intake 3534 ml  Output 200 ml  Net 3334 ml    Labs/Imaging Results for orders placed or performed during the hospital encounter of 06/29/22 (from the past 48 hour(s))  CBC with Differential     Status: None   Collection Time: 06/29/22 10:27 AM  Result Value Ref Range   WBC 5.2 4.0 - 10.5 K/uL   RBC 4.67 4.22 - 5.81 MIL/uL   Hemoglobin 14.9 13.0 - 17.0 g/dL   HCT 02.7 25.3 - 66.4 %   MCV 94.2 80.0 - 100.0 fL   MCH 31.9  26.0 - 34.0 pg   MCHC 33.9 30.0 - 36.0 g/dL   RDW 16.1 09.6 - 04.5 %   Platelets 183 150 - 400 K/uL   nRBC 0.0 0.0 - 0.2 %   Neutrophils Relative % 70 %   Neutro Abs 3.6 1.7 - 7.7 K/uL   Lymphocytes Relative 20 %   Lymphs Abs 1.1 0.7 - 4.0 K/uL   Monocytes Relative 10 %   Monocytes Absolute 0.5 0.1 - 1.0 K/uL   Eosinophils Relative 0 %   Eosinophils Absolute 0.0 0.0 - 0.5 K/uL   Basophils Relative 0 %   Basophils Absolute 0.0 0.0 - 0.1 K/uL   Immature Granulocytes 0 %   Abs Immature Granulocytes 0.02 0.00 - 0.07 K/uL    Comment: Performed at [facility], [address] [place], [place] [address]  Comprehensive metabolic panel     Status: Abnormal   Collection Time: 06/29/22 10:27 AM   Result Value Ref Range   Sodium 139 135 - 145 mmol/L   Potassium 3.3 (L) 3.5 - 5.1 mmol/L   Chloride 103 98 - 111 mmol/L   CO2 24 22 - 32 mmol/L   Glucose, Bld 152 (H) 70 - 99 mg/dL    Comment: Glucose reference range applies only to samples taken after fasting for at least 8 hours.   BUN 45 (H) 8 - 23 mg/dL   Creatinine, Ser 1.91 (H) 0.61 - 1.24 mg/dL   Calcium 8.2 (L) 8.9 - 10.3 mg/dL   Total Protein 6.7 6.5 - 8.1 g/dL   Albumin 3.3 (L) 3.5 - 5.0 g/dL   AST 19 15 - 41 U/L   ALT 14 0 - 44 U/L   Alkaline Phosphatase 62 38 - 126 U/L   Total Bilirubin 0.8 0.3 - 1.2 mg/dL   GFR, Estimated 28 (L) >60 mL/min    Comment: (NOTE) Calculated using the CKD-EPI Creatinine Equation (2021)    Anion gap 12 5 - 15    Comment: Performed at [facility], [address], [place], [place] [address]  Lipase, blood     Status: Abnormal   Collection Time: 06/29/22 10:27 AM  Result Value Ref Range   Lipase 69 (H) 11 - 51 U/L    Comment: Performed at [facility], [address], [place], [place] [address]  Troponin I (High Sensitivity)     Status: Abnormal   Collection Time: 06/29/22 10:27 AM  Result Value Ref Range   Troponin I (High Sensitivity) 24 (H) <18 ng/L    Comment: (NOTE) Elevated high sensitivity troponin I (hsTnI) values and significant  changes across serial measurements may suggest ACS but many other  chronic and acute conditions are known to elevate hsTnI results.  Refer to the "Links" section for chest pain algorithms and additional  guidance. Performed at [facility], [address], [place], [place] [address]   Brain natriuretic peptide     Status: None   Collection Time: 06/29/22 10:27 AM  Result Value Ref Range   B Natriuretic Peptide 98.6 0.0 - 100.0 pg/mL    Comment: Performed at [facility], [address], [place], [place] [address]  Lactic acid, plasma     Status: None   Collection Time: 06/29/22 10:27 AM  Result Value Ref Range   Lactic  Acid, Venous 1.6 0.5 - 1.9 mmol/L    Comment: Performed at [facility], [address], [place], [place] [address]  Troponin I (High Sensitivity)     Status: None   Collection Time: 06/29/22 12:27 PM  Result Value Ref Range   Troponin I (High Sensitivity) 17 <18 ng/L    Comment: (NOTE) Elevated high sensitivity troponin I (hsTnI) values and significant  changes across serial measurements may suggest ACS but many other  chronic and acute conditions are known to elevate hsTnI results.  Refer to the "Links" section for chest pain algorithms and additional  guidance. Performed at [facility], [address] [place], [place], [place] [address]   Basic metabolic panel     Status: Abnormal   Collection Time: 06/29/22  1:08 PM  Result Value Ref Range   Sodium 139 135 - 145 mmol/L   Potassium 3.2 (L) 3.5 - 5.1 mmol/L   Chloride 107 98 - 111 mmol/L   CO2 23 22 - 32 mmol/L   Glucose, Bld 114 (H) 70 - 99 mg/dL    Comment: Glucose reference range applies only to samples taken after fasting for at least 8 hours.   BUN 43 (H) 8 - 23 mg/dL   Creatinine, Ser 6.04 (H) 0.61 - 1.24 mg/dL   Calcium 7.4 (L) 8.9 - 10.3 mg/dL   GFR, Estimated 33 (L) >60 mL/min    Comment: (NOTE) Calculated using the CKD-EPI Creatinine Equation (2021)    Anion gap 9 5 - 15    Comment: Performed at [facility], [address] [place], [place] [address]   CT ABDOMEN PELVIS W CONTRAST  Result Date: 06/29/2022 CLINICAL DATA:  Left lower quadrant abdominal pain, diarrhea, and nausea EXAM: CT ABDOMEN AND PELVIS WITH CONTRAST TECHNIQUE: Multidetector CT imaging of the abdomen and pelvis was performed using the standard protocol following bolus administration of intravenous contrast. RADIATION DOSE REDUCTION: This exam was performed according to the departmental dose-optimization program which includes automated exposure control, adjustment of the mA and/or kV according to patient size and/or use of iterative  reconstruction technique. CONTRAST:  65mL OMNIPAQUE IOHEXOL 350 MG/ML SOLN COMPARISON:  None Available. FINDINGS: Lower chest: Please see separately reported examination of the chest. Hepatobiliary: No solid liver abnormality is seen. Small gallstones. No gallbladder wall thickening, or biliary dilatation. Pancreas: Unremarkable. No pancreatic ductal dilatation or surrounding inflammatory changes. Spleen: Normal in size without significant abnormality. Adrenals/Urinary Tract: Benign non nodular adenomatous thickening of the bilateral adrenal glands, for which no further follow-up or characterization is required. Kidneys are normal, without renal calculi, solid lesion, or hydronephrosis. Bladder is unremarkable. Stomach/Bowel: Stomach is within normal limits. Appendix appears normal. No evidence of bowel wall thickening, distention, or inflammatory changes. Descending and sigmoid diverticulosis. Suspect mild wall thickening and fat stranding of the distal sigmoid colon (series 6, image 67). Vascular/Lymphatic: Severe aortic atherosclerosis. Infrarenal abdominal aortic aneurysm measuring up to 3.2 x 3.2 cm in caliber. No enlarged abdominal or pelvic lymph nodes. Reproductive: No mass or other significant abnormality. Other: Small, fat containing umbilical hernia. Trace free fluid in the low pelvis (series 6, image 68) Musculoskeletal: No acute or significant osseous findings. IMPRESSION: 1. Descending and sigmoid diverticulosis. Suspect mild wall thickening and fat stranding about the distal sigmoid colon, suggestive of mild diverticulitis. No evidence of complicating perforation or abscess at this time. 2. Trace free fluid in the low pelvis, most likely reactive. 3. Cholelithiasis without evidence of acute cholecystitis. 4. Infrarenal abdominal aortic aneurysm measuring up to 3.2 cm in caliber. Recommend follow-up ultrasound every 3 years if not otherwise imaged and if clinically appropriate. This  recommendation  follows ACR consensus guidelines: White Paper of the ACR Incidental Findings Committee II on Vascular Findings. J Am Coll Radiol 2013; 10:789-794. Aortic Atherosclerosis (ICD10-I70.0). Electronically Signed   By: Jearld Lesch M.D.   On: 06/29/2022 15:33   CT Angio Chest PE W and/or Wo Contrast  Result Date: 06/29/2022 CLINICAL DATA:  PE suspected EXAM: CT ANGIOGRAPHY CHEST WITH CONTRAST TECHNIQUE: Multidetector CT imaging of the chest was performed using the standard protocol during bolus administration of intravenous contrast. Multiplanar CT image reconstructions and MIPs were obtained to evaluate the vascular anatomy. RADIATION DOSE REDUCTION: This exam was performed according to the departmental dose-optimization program which includes automated exposure control, adjustment of the mA and/or kV according to patient size and/or use of iterative reconstruction technique. CONTRAST:  65mL OMNIPAQUE IOHEXOL 350 MG/ML SOLN COMPARISON:  03/10/2021 FINDINGS: Cardiovascular: Satisfactory opacification of the pulmonary arteries to the segmental level. No evidence of pulmonary embolism. Normal heart size. Three-vessel coronary artery calcifications. No pericardial effusion. Aortic atherosclerosis. Mediastinum/Nodes: No enlarged mediastinal, hilar, or axillary lymph nodes. Thyroid gland, trachea, and esophagus demonstrate no significant findings. Lungs/Pleura: Moderate centrilobular and paraseptal emphysema. Pulmonary hyperinflation. Diffuse bilateral bronchial wall thickening. Mild heterogeneous consolidative airspace opacity of the right-greater-than-left lung bases (series 4, image 143). No pleural effusion or pneumothorax. Upper Abdomen: Please see separately reported examination of the abdomen and pelvis. Musculoskeletal: No chest wall abnormality. No acute osseous findings. Review of the MIP images confirms the above findings. IMPRESSION: 1. Negative examination for pulmonary embolism. 2. Mild heterogeneous and  consolidative airspace opacity of the right-greater-than-left lung bases, consistent with infection or aspiration. 3. Emphysema and diffuse bilateral bronchial wall thickening. 4. Coronary artery disease. Aortic Atherosclerosis (ICD10-I70.0) and Emphysema (ICD10-J43.9). Electronically Signed   By: Jearld Lesch M.D.   On: 06/29/2022 15:21   DG Chest Portable 1 View  Result Date: 06/29/2022 CLINICAL DATA:  COVID-19 diagnosed 3 days ago. Decreased p.o. intake and diarrhea. Nonproductive cough. Small amount of vomiting. Smoker. EXAM: PORTABLE CHEST 1 VIEW COMPARISON:  12/08/2019 FINDINGS: Normal sized heart. The lungs are clear and hyperexpanded with mild peribronchial thickening. Unremarkable bones. IMPRESSION: No acute abnormality. Mild changes of COPD and chronic bronchitis. Electronically Signed   By: Beckie Salts M.D.   On: 06/29/2022 11:24    Pending Labs Unresulted Labs (From admission, onward)     Start     Ordered   06/30/22 0500  Comprehensive metabolic panel  Tomorrow morning,   R        06/29/22 1621   06/30/22 0500  CBC  Tomorrow morning,   R        06/29/22 1621   06/29/22 1027  Urinalysis, Routine w reflex microscopic -Urine, Clean Catch  (ED Abdominal Pain)  Once,   URGENT       Question:  Specimen Source  Answer:  Urine, Clean Catch   06/29/22 1026            Vitals/Pain Today's Vitals   06/29/22 1402 06/29/22 1516 06/29/22 1615 06/29/22 1645  BP:  96/64 109/78 112/87  Pulse:  (!) 103 (!) 101 (!) 106  Resp:  17 20 (!) 22  Temp: 98.4 F (36.9 C)     TempSrc: Oral     SpO2:  97% 99% 98%  Weight:      Height:      PainSc:        Isolation Precautions Airborne precautions  Medications Medications  cefTRIAXone (ROCEPHIN) 2 g in sodium chloride 0.9 %  100 mL IVPB (0 g Intravenous Stopped 06/29/22 1705)    And  metroNIDAZOLE (FLAGYL) IVPB 500 mg (500 mg Intravenous New Bag/Given 06/29/22 1659)  lactated ringers infusion ( Intravenous New Bag/Given 06/29/22 1659)   enoxaparin (LOVENOX) injection 40 mg (40 mg Subcutaneous Given 06/29/22 1714)  sodium chloride flush (NS) 0.9 % injection 3 mL (has no administration in time range)  acetaminophen (TYLENOL) tablet 650 mg (has no administration in time range)    Or  acetaminophen (TYLENOL) suppository 650 mg (has no administration in time range)  ondansetron (ZOFRAN) tablet 4 mg (has no administration in time range)    Or  ondansetron (ZOFRAN) injection 4 mg (has no administration in time range)  albuterol (PROVENTIL) (2.5 MG/3ML) 0.083% nebulizer solution 2.5 mg (has no administration in time range)  cefTRIAXone (ROCEPHIN) 2 g in sodium chloride 0.9 % 100 mL IVPB (has no administration in time range)  metroNIDAZOLE (FLAGYL) IVPB 500 mg (has no administration in time range)  sodium chloride 0.9 % bolus 1,000 mL (0 mLs Intravenous Stopped 06/29/22 1148)  sodium chloride 0.9 % bolus 1,000 mL (0 mLs Intravenous Stopped 06/29/22 1336)  sodium chloride 0.9 % bolus 500 mL (0 mLs Intravenous Stopped 06/29/22 1552)  lactated ringers bolus 500 mL (500 mLs Intravenous New Bag/Given 06/29/22 1534)  iohexol (OMNIPAQUE) 350 MG/ML injection 65 mL (65 mLs Intravenous Contrast Given 06/29/22 1505)  ipratropium-albuterol (DUONEB) 0.5-2.5 (3) MG/3ML nebulizer solution 3 mL (3 mLs Nebulization Given 06/29/22 1709)  methylPREDNISolone sodium succinate (SOLU-MEDROL) 125 mg/2 mL injection 62.5 mg (62.5 mg Intravenous Given 06/29/22 1712)  potassium chloride SA (KLOR-CON M) CR tablet 60 mEq (60 mEq Oral Given 06/29/22 1709)    Mobility walks with person assist     Focused Assessments Pulmonary Assessment Handoff:  Lung sounds: Bilateral Breath Sounds: Rhonchi O2 Device: Nasal Cannula O2 Flow Rate (L/min): 2 L/min    R Recommendations: See Admitting Provider Note  Report given to:   Additional Notes:

## 2022-06-29 NOTE — ED Provider Notes (Signed)
[place] EMERGENCY DEPARTMENT AT [facility] Provider Note  CSN: [id number] Arrival date & time: 06/29/22 1012  Chief Complaint(s) Diarrhea and Covid Positive  HPI John Woodward. is a 77 y.o. male with past medical history as below, significant for COVID-19, PAD, A-fib, emphysema, HLD, tobacco abuse, HTN who presents to the ED with complaint of diarrhea, fatigue, cough, weakness globally.  Patient has been feeling unwell since Friday when was dx w/ covid 19.  Poor p.o. intake, nausea, diarrhea, no vomiting.  Cough intermittently, mild dyspnea.  No Sig chest pain.  + Generalized abdominal discomfort w/ diarrhea, no blood in stool or melena.  Continues to smoke cigarettes.  Past Medical History Past Medical History:  Diagnosis Date   Arthritis    Carotid artery disease (HCC)    Closed fracture dislocation of right elbow    30 years ago -- fell out of theback of a truck   Diabetes mellitus without complication (HCC)    Hx of colonic polyps 02/23/2017   Hyperlipidemia    Hypertension    Diagnosed 10 years ago. No on medication. Denies history of CAD.    Memory loss    PAD (peripheral artery disease) [facility])    Patient Active Problem List   Diagnosis Date Noted   Depression, recurrent (HCC) 01/08/2022   Well controlled diabetes mellitus (HCC) 06/10/2021   Memory deficit 06/10/2021   A-fib (HCC) 12/03/2019   Pulmonary emphysema (HCC) 09/22/2018   Hyperlipidemia associated with type 2 diabetes mellitus (HCC) 10/02/2016   Annual physical exam 09/16/2016   Prostate cancer screening 09/16/2016   Tobacco abuse disorder 08/11/2016   Essential hypertension 08/11/2016   Home Medication(s) Prior to Admission medications   Medication Sig Start Date End Date Taking? Authorizing Provider  amLODipine (NORVASC) 10 MG tablet TAKE 1 TABLET BY MOUTH EVERY DAY 05/01/22   Tolia, Sunit, DO  ELIQUIS 5 MG TABS tablet TAKE ONE TABLET BY MOUTH TWICE DAILY 04/24/21   Cantwell, Celeste C, PA-C   escitalopram (LEXAPRO) 20 MG tablet Take 1 tablet (20 mg total) by mouth daily. 01/22/22   Sheliah Hatch, MD  hydrALAZINE (APRESOLINE) 50 MG tablet Take 1 tablet (50 mg total) by mouth 3 (three) times daily. 07/23/21   Cantwell, Celeste C, PA-C  hydrochlorothiazide (MICROZIDE) 12.5 MG capsule Take 1 capsule (12.5 mg total) by mouth daily. 05/27/22   Tolia, Sunit, DO  metFORMIN (GLUCOPHAGE) 500 MG tablet Take one tablet every morning and take two tablets by mouth every evening Patient taking differently: Take 500 mg by mouth daily with breakfast. Take one tablet every morning and take two tablets by mouth every evening 10/30/21   Sheliah Hatch, MD  nicotine polacrilex (COMMIT) 4 MG lozenge Take 1 lozenge (4 mg total) by mouth as needed for up to 90 doses for smoking cessation. 02/13/22   Tolia, Sunit, DO  rosuvastatin (CRESTOR) 40 MG tablet Take 1 tablet (40 mg total) by mouth at bedtime. 05/19/22 11/15/22  Tolia, Sunit, DO  telmisartan (MICARDIS) 80 MG tablet Take 1 tablet (80 mg total) by mouth daily. 05/27/22   Tolia, Sunit, DO  metoprolol tartrate (LOPRESSOR) 25 MG tablet Take 1 tablet (25 mg total) by mouth 2 (two) times daily. 02/01/20 02/16/20  Cantwell, Renne Musca, PA-C  Past Surgical History Past Surgical History:  Procedure Laterality Date   OLECRANON BURSECTOMY Right 08/18/2016   Procedure: OLECRANON BURSECTOMY;  Surgeon: Cammy Copa, MD;  Location: [facility] OR;  Service: Orthopedics;  Laterality: Right;   Family History Family History  Problem Relation Age of Onset   Hypertension Mother    Dementia Mother        62s   Transient ischemic attack Mother        73s   Kidney disease Father    COPD Father    Hypertension Brother    Lung cancer Maternal Aunt    Alcohol abuse Maternal Grandfather    Mental illness Paternal Grandmother    Hypertension  Daughter    Healthy Son    Learning disabilities Son    Colon cancer Neg Hx    Colon polyps Neg Hx    Esophageal cancer Neg Hx    Rectal cancer Neg Hx    Stomach cancer Neg Hx     Social History Social History   Tobacco Use   Smoking status: Every Day    Packs/day: 0.25    Years: 50.00    Additional pack years: 0.00    Total pack years: 12.50    Types: Cigarettes    Start date: 08/12/1966   Smokeless tobacco: Never   Tobacco comments:    Taking Nicotine lozenges  Vaping Use   Vaping Use: Never used  Substance Use Topics   Alcohol use: No   Drug use: No   Allergies Antibacterial hand soap [triclosan]  Review of Systems Review of Systems  Constitutional:  Positive for fatigue and fever. Negative for chills.  HENT:  Negative for facial swelling and trouble swallowing.   Eyes:  Negative for photophobia and visual disturbance.  Respiratory:  Positive for cough and shortness of breath.   Cardiovascular:  Negative for chest pain and palpitations.  Gastrointestinal:  Positive for abdominal pain, diarrhea and nausea. Negative for vomiting.  Endocrine: Negative for polydipsia and polyuria.  Genitourinary:  Negative for difficulty urinating and hematuria.  Musculoskeletal:  Negative for gait problem and joint swelling.  Skin:  Negative for pallor and rash.  Neurological:  Negative for syncope and headaches.  Psychiatric/Behavioral:  Negative for agitation and confusion.     Physical Exam Vital Signs  I have reviewed the triage vital signs BP 96/64   Pulse (!) 103   Temp 98.4 F (36.9 C) (Oral)   Resp 17   Ht 5\' 11"  (1.803 m)   Wt 68 kg   SpO2 97%   BMI 20.92 kg/m  Physical Exam Vitals and nursing note reviewed.  Constitutional:      General: He is not in acute distress.    Appearance: He is well-developed. He is not ill-appearing.  HENT:     Head: Normocephalic and atraumatic.     Right Ear: External ear normal.     Left Ear: External ear normal.      Mouth/Throat:     Mouth: Mucous membranes are moist.  Eyes:     General: No scleral icterus. Cardiovascular:     Rate and Rhythm: Regular rhythm. Tachycardia present.     Pulses: Normal pulses.     Heart sounds: Normal heart sounds.  Pulmonary:     Effort: Pulmonary effort is normal. No respiratory distress.     Breath sounds: Normal breath sounds.  Abdominal:     General: Abdomen is flat.     Palpations: Abdomen is soft.  Tenderness: There is abdominal tenderness. There is no guarding or rebound.    Musculoskeletal:        General: Normal range of motion.     Cervical back: Normal range of motion.     Right lower leg: No edema.     Left lower leg: No edema.  Skin:    General: Skin is warm and dry.     Capillary Refill: Capillary refill takes less than 2 seconds.  Neurological:     Mental Status: He is alert and oriented to person, place, and time.  Psychiatric:        Mood and Affect: Mood normal.        Behavior: Behavior normal.     ED Results and Treatments Labs (all labs ordered are listed, but only abnormal results are displayed) Labs Reviewed  COMPREHENSIVE METABOLIC PANEL - Abnormal; Notable for the following components:      Result Value   Potassium 3.3 (*)    Glucose, Bld 152 (*)    BUN 45 (*)    Creatinine, Ser 2.36 (*)    Calcium 8.2 (*)    Albumin 3.3 (*)    GFR, Estimated 28 (*)    All other components within normal limits  LIPASE, BLOOD - Abnormal; Notable for the following components:   Lipase 69 (*)    All other components within normal limits  BASIC METABOLIC PANEL - Abnormal; Notable for the following components:   Potassium 3.2 (*)    Glucose, Bld 114 (*)    BUN 43 (*)    Creatinine, Ser 2.04 (*)    Calcium 7.4 (*)    GFR, Estimated 33 (*)    All other components within normal limits  TROPONIN I (HIGH SENSITIVITY) - Abnormal; Notable for the following components:   Troponin I (High Sensitivity) 24 (*)    All other components within  normal limits  CBC WITH DIFFERENTIAL/PLATELET  BRAIN NATRIURETIC PEPTIDE  LACTIC ACID, PLASMA  URINALYSIS, ROUTINE W REFLEX MICROSCOPIC  TROPONIN I (HIGH SENSITIVITY)                                                                                                                          Radiology CT ABDOMEN PELVIS W CONTRAST  Result Date: 06/29/2022 CLINICAL DATA:  Left lower quadrant abdominal pain, diarrhea, and nausea EXAM: CT ABDOMEN AND PELVIS WITH CONTRAST TECHNIQUE: Multidetector CT imaging of the abdomen and pelvis was performed using the standard protocol following bolus administration of intravenous contrast. RADIATION DOSE REDUCTION: This exam was performed according to the departmental dose-optimization program which includes automated exposure control, adjustment of the mA and/or kV according to patient size and/or use of iterative reconstruction technique. CONTRAST:  65mL OMNIPAQUE IOHEXOL 350 MG/ML SOLN COMPARISON:  None Available. FINDINGS: Lower chest: Please see separately reported examination of the chest. Hepatobiliary: No solid liver abnormality is seen. Small gallstones. No gallbladder wall thickening, or biliary dilatation. Pancreas: Unremarkable. No pancreatic ductal dilatation or surrounding  inflammatory changes. Spleen: Normal in size without significant abnormality. Adrenals/Urinary Tract: Benign non nodular adenomatous thickening of the bilateral adrenal glands, for which no further follow-up or characterization is required. Kidneys are normal, without renal calculi, solid lesion, or hydronephrosis. Bladder is unremarkable. Stomach/Bowel: Stomach is within normal limits. Appendix appears normal. No evidence of bowel wall thickening, distention, or inflammatory changes. Descending and sigmoid diverticulosis. Suspect mild wall thickening and fat stranding of the distal sigmoid colon (series 6, image 67). Vascular/Lymphatic: Severe aortic atherosclerosis. Infrarenal abdominal  aortic aneurysm measuring up to 3.2 x 3.2 cm in caliber. No enlarged abdominal or pelvic lymph nodes. Reproductive: No mass or other significant abnormality. Other: Small, fat containing umbilical hernia. Trace free fluid in the low pelvis (series 6, image 68) Musculoskeletal: No acute or significant osseous findings. IMPRESSION: 1. Descending and sigmoid diverticulosis. Suspect mild wall thickening and fat stranding about the distal sigmoid colon, suggestive of mild diverticulitis. No evidence of complicating perforation or abscess at this time. 2. Trace free fluid in the low pelvis, most likely reactive. 3. Cholelithiasis without evidence of acute cholecystitis. 4. Infrarenal abdominal aortic aneurysm measuring up to 3.2 cm in caliber. Recommend follow-up ultrasound every 3 years if not otherwise imaged and if clinically appropriate. This recommendation follows ACR consensus guidelines: White Paper of the ACR Incidental Findings Committee II on Vascular Findings. J Am Coll Radiol 2013; 10:789-794. Aortic Atherosclerosis (ICD10-I70.0). Electronically Signed   By: Jearld Lesch M.D.   On: 06/29/2022 15:33   CT Angio Chest PE W and/or Wo Contrast  Result Date: 06/29/2022 CLINICAL DATA:  PE suspected EXAM: CT ANGIOGRAPHY CHEST WITH CONTRAST TECHNIQUE: Multidetector CT imaging of the chest was performed using the standard protocol during bolus administration of intravenous contrast. Multiplanar CT image reconstructions and MIPs were obtained to evaluate the vascular anatomy. RADIATION DOSE REDUCTION: This exam was performed according to the departmental dose-optimization program which includes automated exposure control, adjustment of the mA and/or kV according to patient size and/or use of iterative reconstruction technique. CONTRAST:  65mL OMNIPAQUE IOHEXOL 350 MG/ML SOLN COMPARISON:  03/10/2021 FINDINGS: Cardiovascular: Satisfactory opacification of the pulmonary arteries to the segmental level. No evidence of  pulmonary embolism. Normal heart size. Three-vessel coronary artery calcifications. No pericardial effusion. Aortic atherosclerosis. Mediastinum/Nodes: No enlarged mediastinal, hilar, or axillary lymph nodes. Thyroid gland, trachea, and esophagus demonstrate no significant findings. Lungs/Pleura: Moderate centrilobular and paraseptal emphysema. Pulmonary hyperinflation. Diffuse bilateral bronchial wall thickening. Mild heterogeneous consolidative airspace opacity of the right-greater-than-left lung bases (series 4, image 143). No pleural effusion or pneumothorax. Upper Abdomen: Please see separately reported examination of the abdomen and pelvis. Musculoskeletal: No chest wall abnormality. No acute osseous findings. Review of the MIP images confirms the above findings. IMPRESSION: 1. Negative examination for pulmonary embolism. 2. Mild heterogeneous and consolidative airspace opacity of the right-greater-than-left lung bases, consistent with infection or aspiration. 3. Emphysema and diffuse bilateral bronchial wall thickening. 4. Coronary artery disease. Aortic Atherosclerosis (ICD10-I70.0) and Emphysema (ICD10-J43.9). Electronically Signed   By: Jearld Lesch M.D.   On: 06/29/2022 15:21   DG Chest Portable 1 View  Result Date: 06/29/2022 CLINICAL DATA:  COVID-19 diagnosed 3 days ago. Decreased p.o. intake and diarrhea. Nonproductive cough. Small amount of vomiting. Smoker. EXAM: PORTABLE CHEST 1 VIEW COMPARISON:  12/08/2019 FINDINGS: Normal sized heart. The lungs are clear and hyperexpanded with mild peribronchial thickening. Unremarkable bones. IMPRESSION: No acute abnormality. Mild changes of COPD and chronic bronchitis. Electronically Signed   By: Zada Finders.D.  On: 06/29/2022 11:24    Pertinent labs & imaging results that were available during my care of the patient were reviewed by me and considered in my medical decision making (see MDM for details).  Medications Ordered in ED Medications   cefTRIAXone (ROCEPHIN) 2 g in sodium chloride 0.9 % 100 mL IVPB (2 g Intravenous New Bag/Given 06/29/22 1607)    And  metroNIDAZOLE (FLAGYL) IVPB 500 mg (has no administration in time range)  lactated ringers infusion (has no administration in time range)  sodium chloride 0.9 % bolus 1,000 mL (0 mLs Intravenous Stopped 06/29/22 1148)  sodium chloride 0.9 % bolus 1,000 mL (0 mLs Intravenous Stopped 06/29/22 1336)  sodium chloride 0.9 % bolus 500 mL (0 mLs Intravenous Stopped 06/29/22 1552)  lactated ringers bolus 500 mL (500 mLs Intravenous New Bag/Given 06/29/22 1534)  iohexol (OMNIPAQUE) 350 MG/ML injection 65 mL (65 mLs Intravenous Contrast Given 06/29/22 1505)                                                                                                                                     Procedures .Critical Care  Performed by: Sloan Leiter, DO Authorized by: Sloan Leiter, DO   Critical care provider statement:    Critical care time (minutes):  44   Critical care time was exclusive of:  Separately billable procedures and treating other patients   Critical care was necessary to treat or prevent imminent or life-threatening deterioration of the following conditions:  Respiratory failure and dehydration   Critical care was time spent personally by me on the following activities:  Development of treatment plan with patient or surrogate, discussions with consultants, evaluation of patient's response to treatment, examination of patient, ordering and review of laboratory studies, ordering and review of radiographic studies, ordering and performing treatments and interventions, pulse oximetry, re-evaluation of patient's condition, review of old charts and obtaining history from patient or surrogate   (including critical care time)  Medical Decision Making / ED Course    Medical Decision Making:    Arvid Degroot. is a 77 y.o. male with past medical history as below, significant for  COVID-19, PAD, A-fib, emphysema, HLD, tobacco abuse, HTN who presents to the ED with complaint of diarrhea, fatigue, cough, weakness globally.. The complaint involves an extensive differential diagnosis and also carries with it a high risk of complications and morbidity.  Serious etiology was considered. Ddx includes but is not limited to: In my evaluation of this patient's dyspnea my DDx includes, but is not limited to, pneumonia, pulmonary embolism, pneumothorax, pulmonary edema, metabolic acidosis, asthma, COPD, cardiac cause, anemia, anxiety, etc.  Differential diagnosis includes but is not exclusive to acute cholecystitis, intrathoracic causes for epigastric abdominal pain, gastritis, duodenitis, pancreatitis, small bowel or large bowel obstruction, abdominal aortic aneurysm, hernia, gastritis, etc. Differential diagnosis includes but is not exclusive to acute appendicitis, renal colic, testicular torsion, urinary tract  infection, prostatitis,  diverticulitis, small bowel obstruction, colitis, abdominal aortic aneurysm, gastroenteritis, constipation etc.    Complete initial physical exam performed, notably the patient  was hypotensive on arrival, requiring 2 L nasal cannula does not usually wear oxygen at home.  Tachycardia noted.    Reviewed and confirmed nursing documentation for past medical history, family history, social history.  Vital signs reviewed.    Clinical Course as of 06/29/22 1617  Mon Jun 29, 2022  1605 End organ damage likely 2/2 dehydration, unlikely sepsis  [SG]    Clinical Course User Index [SG] Sloan Leiter, DO   Patient hypotensive on arrival, given IV fluids with improvement of blood pressure. Placed on 2 L nasal cannula improvement pulse ox to 98% Abdomen soft, nonperitoneal He is AKI on metabolic panel, continue IV fluids, baseline around 1.5, today is 2.3.  Concern dehydration secondary to volume loss and poor p.o. intake Does not appear to be septic, favor  abnormal vital signs and endorgan damage likely secondary to dehydration Diagnosed COVID-19 outpatient setting Imaging reviewed, concern for uncomplicated diverticulitis, start Rocephin and Flagyl.  Cholelithiasis, infrarenal abdominal aortic aneurysm CT PE concerning for infection; recent positive COVID-19 Long term tobacco use, no sig wheezing exam but COPD may be component of resp difficulty as well today; give neb/steroids may also be beneficial given hypoxia in setting of covid 19 Recommend admission given hypotension, AKI, diverticulitis, COVID-19, borderline hypoxia; agreeable.  Signed out to incoming EDP pending admission         Additional history obtained: -Additional history obtained from spouse -External records from outside source obtained and reviewed including: Chart review including previous notes, labs, imaging, consultation notes including prior office notes, prior labs and imaging, home medications   Lab Tests: -I ordered, reviewed, and interpreted labs.   The pertinent results include:   Labs Reviewed  COMPREHENSIVE METABOLIC PANEL - Abnormal; Notable for the following components:      Result Value   Potassium 3.3 (*)    Glucose, Bld 152 (*)    BUN 45 (*)    Creatinine, Ser 2.36 (*)    Calcium 8.2 (*)    Albumin 3.3 (*)    GFR, Estimated 28 (*)    All other components within normal limits  LIPASE, BLOOD - Abnormal; Notable for the following components:   Lipase 69 (*)    All other components within normal limits  BASIC METABOLIC PANEL - Abnormal; Notable for the following components:   Potassium 3.2 (*)    Glucose, Bld 114 (*)    BUN 43 (*)    Creatinine, Ser 2.04 (*)    Calcium 7.4 (*)    GFR, Estimated 33 (*)    All other components within normal limits  TROPONIN I (HIGH SENSITIVITY) - Abnormal; Notable for the following components:   Troponin I (High Sensitivity) 24 (*)    All other components within normal limits  CBC WITH DIFFERENTIAL/PLATELET   BRAIN NATRIURETIC PEPTIDE  LACTIC ACID, PLASMA  URINALYSIS, ROUTINE W REFLEX MICROSCOPIC  TROPONIN I (HIGH SENSITIVITY)    Notable for as above, aki  EKG   EKG Interpretation  Date/Time:  Monday June 29 2022 12:26:38 EDT Ventricular Rate:  125 PR Interval:  139 QRS Duration: 75 QT Interval:  324 QTC Calculation: 468 R Axis:   52 Text Interpretation: Sinus or ectopic atrial tachycardia Minimal ST depression, inferior leads Confirmed by Tanda Rockers ([id number]) on 06/29/2022 2:44:23 PM         Imaging Studies  ordered: I ordered imaging studies including CTPE CTAP CXR I independently visualized the following imaging with scope of interpretation limited to determining acute life threatening conditions related to emergency care; findings noted above, significant for as above I independently visualized and interpreted imaging. I agree with the radiologist interpretation   Medicines ordered and prescription drug management: Meds ordered this encounter  Medications   sodium chloride 0.9 % bolus 1,000 mL   sodium chloride 0.9 % bolus 1,000 mL   sodium chloride 0.9 % bolus 500 mL   lactated ringers bolus 500 mL   iohexol (OMNIPAQUE) 350 MG/ML injection 65 mL   AND Linked Order Group    cefTRIAXone (ROCEPHIN) 2 g in sodium chloride 0.9 % 100 mL IVPB     Order Specific Question:   Antibiotic Indication:     Answer:   Intra-abdominal    metroNIDAZOLE (FLAGYL) IVPB 500 mg     Order Specific Question:   Antibiotic Indication:     Answer:   Intra-abdominal Infection   lactated ringers infusion    -I have reviewed the patients home medicines and have made adjustments as needed   Consultations Obtained: na   Cardiac Monitoring: The patient was maintained on a cardiac monitor.  I personally viewed and interpreted the cardiac monitored which showed an underlying rhythm of: sinus tachy  Social Determinants of Health:  Diagnosis or treatment significantly limited by social determinants  of health: current smoker   Reevaluation: After the interventions noted above, I reevaluated the patient and found that they have improved  Co morbidities that complicate the patient evaluation  Past Medical History:  Diagnosis Date   Arthritis    Carotid artery disease (HCC)    Closed fracture dislocation of right elbow    30 years ago -- fell out of theback of a truck   Diabetes mellitus without complication (HCC)    Hx of colonic polyps 02/23/2017   Hyperlipidemia    Hypertension    Diagnosed 10 years ago. No on medication. Denies history of CAD.    Memory loss    PAD (peripheral artery disease) (HCC)       Dispostion: Disposition decision including need for hospitalization was considered, and patient disposition pending at time of sign out.    Final Clinical Impression(s) / ED Diagnoses Final diagnoses:  AKI (acute kidney injury) (HCC)  COVID-19  Diarrhea, unspecified type  Diverticulitis  Biliary calculus of other site without obstruction  Infrarenal abdominal aortic aneurysm (AAA) without rupture (HCC)     This chart was dictated using voice recognition software.  Despite best efforts to proofread,  errors can occur which can change the documentation meaning.    Tanda Rockers A, DO 06/29/22 1617

## 2022-06-29 NOTE — ED Notes (Signed)
Manual BP 98/68, Wallace Cullens MD aware.

## 2022-06-29 NOTE — ED Triage Notes (Signed)
Pt BIBA from home. A&Ox 3 at baseline. COVID+ Friday. Since then, pt has had decreased intake and diarrhea. Pt has nonproductive cough. Pt states he has thrown up " a little bit"  86/54 500 fluid 130 systolic after  Denies CP, fever  HR 120 - hx a fib

## 2022-06-29 NOTE — H&P (Signed)
History and Physical    Patient: John Woodward. ZOX:[id number] DOB: 1945/08/20 DOA: 06/29/2022 DOS: the patient was seen and examined on 06/29/2022 PCP: Sheliah Hatch, MD  Patient coming from: Home  Chief Complaint:  Chief Complaint  Patient presents with   Diarrhea   Covid Positive   HPI: John Woodward. is a 77 y.o. male with medical history significant of hypertension, hyperlipidemia, PAF on Eliquis, emphysema, prior history of COVID-19, and tobacco abuse who presents with complaints of cough and malaise.  Patient initially started feeling bad 6 days ago.  Reported having a productive cough, wheezing, fevers reported at 102.6 F, loose stools, poor p.o. intake, increased lethargy, and weakness.  Patient currently denies having any episodes of vomiting or complaints of abdominal pain.  His daughter is a Charity fundraiser and tested him for COVID 3 days ago for which she was noted to be positive.  He has had COVID previously in the in 2020 for which he was diagnosed with atrial fibrillation.  Patient does continue to smoke cigarettes, but is currently using the nicotine lozenges.  His last dose of Eliquis was yesterday morning.  This morning when family tried to sit him up he fell backwards and was too weak to get up for which EMS was called.  Initial blood pressure was reported to be 86/54.  Patient was given 500 mL fluid bolus with improvement in blood pressures.  In the emergency department patient was noted to be afebrile with heart rates elevated up to 125 and atrial fibrillation with RVR, and initial blood pressures noted to be as low as 80/59.  Labs significant for CBC within normal limits, potassium 3.3, BUN 45, creatinine 2.36, lipase 69, BNP 98.6, high-sensitivity troponin 24->17.  Chest x-ray showed no acute abnormality.  COVID-19 screening was positive.  CT angiogram of the chest had noted no signs of a pulmonary embolism with mild heterogeneous consolidative airspace opacity in the right  greater than the left lung bases consistent with infection or aspiration.  CT scan of the abdomen pelvis noted descending and sigmoid diverticulosis with mild wall thickening and fat stranding about the distal sigmoid colon suggestive of mild diverticulitis without complicating features.  Patient had initially been bolused 3 Lof normal saline IV fluids and given empiric antibiotics of Rocephin, and metronidazole.  Review of Systems: As mentioned in the history of present illness. All other systems reviewed and are negative. Past Medical History:  Diagnosis Date   Arthritis    Carotid artery disease (HCC)    Closed fracture dislocation of right elbow    30 years ago -- fell out of theback of a truck   Diabetes mellitus without complication (HCC)    Hx of colonic polyps 02/23/2017   Hyperlipidemia    Hypertension    Diagnosed 10 years ago. No on medication. Denies history of CAD.    Memory loss    PAD (peripheral artery disease) [facility])    Past Surgical History:  Procedure Laterality Date   OLECRANON BURSECTOMY Right 08/18/2016   Procedure: OLECRANON BURSECTOMY;  Surgeon: Cammy Copa, MD;  Location[facility];  Service: Orthopedics;  Laterality: Right;   Social History:  reports that he has been smoking cigarettes. He started smoking about 55 years ago. He has a 12.50 pack-year smoking history. He has never used smokeless tobacco. He reports that he does not drink alcohol and does not use drugs.  Allergies  Allergen Reactions   Antibacterial Hand Soap [Triclosan] Other (See Comments)  Caused water blisters    Family History  Problem Relation Age of Onset   Hypertension Mother    Dementia Mother        58s   Transient ischemic attack Mother        83s   Kidney disease Father    COPD Father    Hypertension Brother    Lung cancer Maternal Aunt    Alcohol abuse Maternal Grandfather    Mental illness Paternal Grandmother    Hypertension Daughter    Healthy Son    Learning  disabilities Son    Colon cancer Neg Hx    Colon polyps Neg Hx    Esophageal cancer Neg Hx    Rectal cancer Neg Hx    Stomach cancer Neg Hx     Prior to Admission medications   Medication Sig Start Date End Date Taking? Authorizing Provider  amLODipine (NORVASC) 10 MG tablet TAKE 1 TABLET BY MOUTH EVERY DAY 05/01/22   Tolia, Sunit, DO  ELIQUIS 5 MG TABS tablet TAKE ONE TABLET BY MOUTH TWICE DAILY 04/24/21   Cantwell, Celeste C, PA-C  escitalopram (LEXAPRO) 20 MG tablet Take 1 tablet (20 mg total) by mouth daily. 01/22/22   Sheliah Hatch, MD  hydrALAZINE (APRESOLINE) 50 MG tablet Take 1 tablet (50 mg total) by mouth 3 (three) times daily. 07/23/21   Cantwell, Celeste C, PA-C  hydrochlorothiazide (MICROZIDE) 12.5 MG capsule Take 1 capsule (12.5 mg total) by mouth daily. 05/27/22   Tolia, Sunit, DO  metFORMIN (GLUCOPHAGE) 500 MG tablet Take one tablet every morning and take two tablets by mouth every evening Patient taking differently: Take 500 mg by mouth daily with breakfast. Take one tablet every morning and take two tablets by mouth every evening 10/30/21   Sheliah Hatch, MD  nicotine polacrilex (COMMIT) 4 MG lozenge Take 1 lozenge (4 mg total) by mouth as needed for up to 90 doses for smoking cessation. 02/13/22   Tolia, Sunit, DO  rosuvastatin (CRESTOR) 40 MG tablet Take 1 tablet (40 mg total) by mouth at bedtime. 05/19/22 11/15/22  Tolia, Sunit, DO  telmisartan (MICARDIS) 80 MG tablet Take 1 tablet (80 mg total) by mouth daily. 05/27/22   Tolia, Sunit, DO  metoprolol tartrate (LOPRESSOR) 25 MG tablet Take 1 tablet (25 mg total) by mouth 2 (two) times daily. 02/01/20 02/16/20  Rayford Halsted, PA-C    Physical Exam: Vitals:   06/29/22 1315 06/29/22 1400 06/29/22 1402 06/29/22 1516  BP: 100/71 94/70  96/64  Pulse: (!) 123 (!) 124  (!) 103  Resp: 16 19  17   Temp:   98.4 F (36.9 C)   TempSrc:   Oral   SpO2: 99% 98%  97%  Weight:      Height:       Exam  Constitutional:  Elderly male who appears ill but in no acute distress at this time Eyes: PERRL, lids and conjunctivae normal ENMT: Mucous membranes are moist. Posterior pharynx clear of any exudate or lesions. Neck: normal, supple, no masses, no thyromegaly Respiratory: Normal respiratory effort with some expiratory wheezes appreciated.  Patient currently on 2 L nasal cannula oxygen with O2 saturations maintained. Cardiovascular: Irregular irregular no murmurs / rubs / gallops. No extremity edema.   Abdomen: no tenderness, no masses palpated. Bowel sounds positive.  Musculoskeletal: no clubbing / cyanosis. No joint deformity upper and lower extremities.  Skin: no rashes, lesions, ulcers.  Poor skin turgor. Neurologic: CN 2-12 grossly intact.  Able to  move all extremities Psychiatric: Normal judgment and insight. Alert and oriented x 3. Normal mood.   Data Reviewed:  EKG revealed sinus rhythm at 125 bpm with QTc 125.  Assessment and Plan:  Pneumonia due to COVID-19 Acute patient has been having a productive cough with shortness of breath which initially started 6 days ago.  He is currently out of the window for Paxlovid.  CT angiogram of the chest noted concern for mild heterogeneous and consolidative airspace opacity of the right lung greater than the left lung bases consistent with infection or aspiration.  Patient had not reported any episodes of vomiting. -Admit to a telemetry bed -COVID-19 order set utilized -Incentive spirometry and flutter valve -Continuous pulse oximetry with oxygen maintain O2 saturation greater than 92%. -Add on procalcitonin -Solu-Medrol IV -Rocephin and azithromycin  Transient hypotension Resolved.  Patient was reported to have blood pressures as low as 86/54 with EMS.  Blood pressure noted to improve after IV fluid boluses. -Goal MAP greater than 65 -Held home blood pressure medications of amlodipine, hydralazine, telmisartan, and hydrochlorothiazide.  Determine when  medically appropriate to resume  Paroxysmal atrial fibrillation on chronic anticoagulation Patient was found to be in A-fib on admission with heart rates into the 120s.  Patient is not on any rate controlling medications at baseline.  Heart rates seem to improve after IV fluids. -Continue Eliquis -Goal potassium at least 4 and magnesium at least 2  Acute kidney injury superimposed on chronic kidney disease stage IIIb Creatinine noted to be initially elevated up to 2.36 with BUN 45.  Baseline creatinine previously noted to be around 1.5.  Patient had been bolused 3 L of normal saline IV fluids with improvement in creatinine to 2.04 with BUN 43 on repeat check. -Continue IV fluids -Avoid nephrotoxic agents -Recheck kidney function in a.m.  Hypokalemia Acute.  Initial potassium noted to be 3.3 with repeat 3.2. -Give potassium chloride 60 mEq x 1 dose now. -Continue to monitor and replace as needed  Diabetes mellitus type 2, without long-term use of insulin Home medication regimen includes metformin. -Hypoglycemic protocols -Hold metformin -CBGs before every meal with sensitive SSI  Depression -Continue Lexapro  History of CVA Prior history of lacunar stroke. -Continue Crestor and Eliquis  Hyperlipidemia associated with diabetes mellitus type 2 -Continue Crestor  Tobacco abuse Patient still reports smoking cigarettes on a regular basis. -Continue throat lozenges -Continue to encourage need of cessation tobacco use DVT prophylaxis: Eliquis  Advance Care Planning:   Code Status: Full Code    Consults: None  Family Communication: Daughter updated at bedside Severity of Illness: The appropriate patient status for this patient is INPATIENT. Inpatient status is judged to be reasonable and necessary in order to provide the required intensity of service to ensure the patient's safety. The patient's presenting symptoms, physical exam findings, and initial radiographic and laboratory  data in the context of their chronic comorbidities is felt to place them at high risk for further clinical deterioration. Furthermore, it is not anticipated that the patient will be medically stable for discharge from the hospital within 2 midnights of admission.   * I certify that at the point of admission it is my clinical judgment that the patient will require inpatient hospital care spanning beyond 2 midnights from the point of admission due to high intensity of service, high risk for further deterioration and high frequency of surveillance required.*  Author: Clydie Braun, MD 06/29/2022 4:12 PM  For on call review [email or web address].

## 2022-06-29 NOTE — ED Provider Notes (Signed)
  Physical Exam  BP 96/64   Pulse (!) 103   Temp 98.4 F (36.9 C) (Oral)   Resp 17   Ht 5\' 11"  (1.803 m)   Wt 68 kg   SpO2 97%   BMI 20.92 kg/m   Physical Exam  Procedures  Procedures  ED Course / MDM    Medical Decision Making Amount and/or Complexity of Data Reviewed Labs: ordered. Radiology: ordered.  Risk Prescription drug management.   PT comes in with cc of diarrhea. Also was recently diagnosed with covid 4 days ago. Noted to have abd pain. CT shows diverticulitis. Pt was hypotensive at arrival. Also not taking his meds.  Pt has AKI. HR has improved. BP has improved with fluids.  Will need admission.        Derwood Kaplan, MD 06/29/22 [phone]

## 2022-06-29 NOTE — Progress Notes (Signed)
Pt arrived to [address] [place] from ED, at 1845. Pt arrived with daughter Nehemiah Settle at bedside. Pt is alert and oriented x4, denies pain, and on 2LNC (baseline room air). Pt admitted to telemetry per order. Pt in NAD at this time.

## 2022-06-30 DIAGNOSIS — J1282 Pneumonia due to coronavirus disease 2019: Secondary | ICD-10-CM | POA: Diagnosis not present

## 2022-06-30 DIAGNOSIS — I959 Hypotension, unspecified: Secondary | ICD-10-CM | POA: Diagnosis not present

## 2022-06-30 DIAGNOSIS — I480 Paroxysmal atrial fibrillation: Secondary | ICD-10-CM | POA: Diagnosis not present

## 2022-06-30 DIAGNOSIS — U071 COVID-19: Secondary | ICD-10-CM | POA: Diagnosis not present

## 2022-06-30 LAB — COMPREHENSIVE METABOLIC PANEL
ALT: 14 U/L (ref 0–44)
AST: 14 U/L — ABNORMAL LOW (ref 15–41)
Albumin: 2.7 g/dL — ABNORMAL LOW (ref 3.5–5.0)
Alkaline Phosphatase: 46 U/L (ref 38–126)
Anion gap: 10 (ref 5–15)
BUN: 37 mg/dL — ABNORMAL HIGH (ref 8–23)
CO2: 20 mmol/L — ABNORMAL LOW (ref 22–32)
Calcium: 8.3 mg/dL — ABNORMAL LOW (ref 8.9–10.3)
Chloride: 107 mmol/L (ref 98–111)
Creatinine, Ser: 1.64 mg/dL — ABNORMAL HIGH (ref 0.61–1.24)
GFR, Estimated: 43 mL/min — ABNORMAL LOW (ref >60)
Glucose, Bld: 235 mg/dL — ABNORMAL HIGH (ref 70–99)
Potassium: 3.5 mmol/L (ref 3.5–5.1)
Sodium: 137 mmol/L (ref 135–145)
Total Bilirubin: 0.4 mg/dL (ref 0.3–1.2)
Total Protein: 5.4 g/dL — ABNORMAL LOW (ref 6.5–8.1)

## 2022-06-30 LAB — CBC
HCT: 34.8 % — ABNORMAL LOW (ref 39.0–52.0)
Hemoglobin: 12.1 g/dL — ABNORMAL LOW (ref 13.0–17.0)
MCH: 32 pg (ref 26.0–34.0)
MCHC: 34.8 g/dL (ref 30.0–36.0)
MCV: 92.1 fL (ref 80.0–100.0)
Platelets: 172 10*3/uL (ref 150–400)
RBC: 3.78 MIL/uL — ABNORMAL LOW (ref 4.22–5.81)
RDW: 12.8 % (ref 11.5–15.5)
WBC: 2.1 10*3/uL — ABNORMAL LOW (ref 4.0–10.5)
nRBC: 0 % (ref 0.0–0.2)

## 2022-06-30 LAB — GLUCOSE, CAPILLARY
Glucose-Capillary: 182 mg/dL — ABNORMAL HIGH (ref 70–99)
Glucose-Capillary: 242 mg/dL — ABNORMAL HIGH (ref 70–99)
Glucose-Capillary: 183 mg/dL — ABNORMAL HIGH (ref 70–99)
Glucose-Capillary: 158 mg/dL — ABNORMAL HIGH (ref 70–99)

## 2022-06-30 LAB — MAGNESIUM: Magnesium: 2.1 mg/dL (ref 1.7–2.4)

## 2022-06-30 MED ORDER — PANTOPRAZOLE SODIUM 40 MG PO TBEC
40.0000 mg | DELAYED_RELEASE_TABLET | Freq: Every day | ORAL | Status: DC
  Administered 2022-07-01: 40 mg via ORAL
  Filled 2022-06-30: qty 1

## 2022-06-30 MED ORDER — POTASSIUM CHLORIDE CRYS ER 20 MEQ PO TBCR
40.0000 meq | EXTENDED_RELEASE_TABLET | Freq: Once | ORAL | Status: AC
  Administered 2022-06-30: 40 meq via ORAL
  Filled 2022-06-30: qty 2

## 2022-06-30 MED ORDER — APIXABAN 5 MG PO TABS
5.0000 mg | ORAL_TABLET | Freq: Two times a day (BID) | ORAL | Status: DC
  Administered 2022-06-30 – 2022-07-01 (×3): 5 mg via ORAL
  Filled 2022-06-30 (×3): qty 1

## 2022-06-30 MED ORDER — SODIUM CHLORIDE 0.9 % IV SOLN
200.0000 mg | Freq: Once | INTRAVENOUS | Status: AC
  Administered 2022-06-30: 200 mg via INTRAVENOUS
  Filled 2022-06-30: qty 40

## 2022-06-30 MED ORDER — NIRMATRELVIR/RITONAVIR (PAXLOVID) TABLET (RENAL DOSING): 2.0000 | ORAL_TABLET | Freq: Two times a day (BID) | ORAL | Status: DC

## 2022-06-30 MED ORDER — SODIUM CHLORIDE 0.9 % IV SOLN
100.0000 mg | Freq: Every day | INTRAVENOUS | Status: DC
  Filled 2022-06-30: qty 20

## 2022-06-30 MED ORDER — METRONIDAZOLE 500 MG/100ML IV SOLN
500.0000 mg | Freq: Two times a day (BID) | INTRAVENOUS | Status: DC
  Administered 2022-06-30: 500 mg via INTRAVENOUS
  Filled 2022-06-30: qty 100

## 2022-06-30 MED ORDER — AZITHROMYCIN 500 MG PO TABS
500.0000 mg | ORAL_TABLET | Freq: Every day | ORAL | Status: DC
  Administered 2022-06-30: 500 mg via ORAL
  Filled 2022-06-30: qty 1

## 2022-06-30 MED ORDER — METRONIDAZOLE 500 MG PO TABS
500.0000 mg | ORAL_TABLET | Freq: Two times a day (BID) | ORAL | Status: DC
  Administered 2022-06-30 – 2022-07-01 (×2): 500 mg via ORAL
  Filled 2022-06-30 (×2): qty 1

## 2022-06-30 MED ORDER — PANTOPRAZOLE SODIUM 40 MG IV SOLR
40.0000 mg | INTRAVENOUS | Status: DC
  Administered 2022-06-30: 40 mg via INTRAVENOUS
  Filled 2022-06-30: qty 10

## 2022-06-30 NOTE — Progress Notes (Addendum)
PROGRESS NOTE    John Woodward.  ZOX:[id number] DOB: Aug 17, 1945 DOA: 06/29/2022 PCP: Sheliah Hatch, MD   Chief Complaint  Patient presents with   Diarrhea   Covid Positive    Brief Narrative:  John Woodward. is a 77 y.o. male with medical history significant of hypertension, hyperlipidemia, PAF on Eliquis, emphysema, prior history of COVID-19, and tobacco abuse who presents with complaints of cough and malaise.  Workup significant for fever 102.6, he does report loose stools, lethargy, he tested positive for COVID-19 infection.      Assessment & Plan:   Principal Problem:   Pneumonia due to COVID-19 virus Active Problems:   Transient hypotension   Paroxysmal atrial fibrillation (HCC)   Chronic anticoagulation   Acute kidney injury superimposed on chronic kidney disease (HCC)   Hypokalemia   Well controlled diabetes mellitus (HCC)   Depression, recurrent (HCC)   History of CVA (cerebrovascular accident)   Hyperlipidemia associated with type 2 diabetes mellitus (HCC)   Tobacco abuse disorder  Pneumonia due to COVID-19 -Acute patient has been having a productive cough with shortness of breath which initially started 6 days ago.   -Still on 2 L oxygen via nasal cannula continue with steroids . -Evidence of pneumonia on imaging, and hypoxia with new oxygen requirement, will start on IV remdesivir given interaction with Paxlovid and his Eliquis . -COVID-19 order set utilized -Incentive spirometry and flutter valve -Continuous pulse oximetry with oxygen maintain O2 saturation greater than 92%. -Rocephin and azithromycin   Transient hypotension Resolved.  Patient was reported to have blood pressures as low as 86/54 with EMS.  Blood pressure noted to improve after IV fluid boluses. -Goal MAP greater than 65 -Held home blood pressure medications of amlodipine, hydralazine, telmisartan, and hydrochlorothiazide.   -Pressures acceptable off these meds, so continue to  hold and they can be resumed once blood pressure started to increase  Paroxysmal atrial fibrillation on chronic anticoagulation Patient was found to be in A-fib on admission with heart rates into the 120s.  Patient is not on any rate controlling medications at baseline.  Heart rates seem to improve after IV fluids.  Troll this morning -Continue Eliquis -Goal potassium at least 4 and magnesium at least 2   Acute kidney injury superimposed on chronic kidney disease stage IIIb Creatinine noted to be initially elevated up to 2.36 with BUN 45.  Baseline creatinine previously noted to be around 1.5.  Patient had been bolused 3 L of normal saline IV fluids with improvement in creatinine to 2.04 with BUN 43 on repeat check. -Continue IV fluids, creatinine is improving at 1.64 today -Avoid nephrotoxic agents  Mild diverticulitis -CT abdomen pelvis significant for mild diverticulitis, even though he denies any symptoms, but given the fact he is on steroids, with acute illness, will add IV Flagyl to Rocephin.   Hypokalemia Repleting  Diabetes mellitus type 2, without long-term use of insulin Home medication regimen includes metformin. -Hypoglycemic protocols -Hold metformin -CBGs before every meal with sensitive SSI   Depression -Continue Lexapro   History of CVA Prior history of lacunar stroke. -Continue Crestor and Eliquis   Hyperlipidemia associated with diabetes mellitus type 2 -Continue Crestor   Tobacco abuse Patient still reports smoking cigarettes on a regular basis. -Continue throat lozenges -Continue to encourage need of cessation tobacco use    DVT prophylaxis: Apixaban Code Status:  Family Communication: None at bedside Disposition:   Status is: Inpatient    Consultants:  none  Subjective:  He reports dyspnea has improved, not at baseline yet, reports cough is minimal.  Objective: Vitals:   06/29/22 1813 06/29/22 1815 06/30/22 0047 06/30/22 0922  BP:   123/70 123/68 126/71  Pulse:  64 60   Resp:  19 20   Temp: (!) 97.4 F (36.3 C)  98.1 F (36.7 C)   TempSrc: Axillary  Oral Oral  SpO2:  96%    Weight:      Height:        Intake/Output Summary (Last 24 hours) at 06/30/2022 1100 Last data filed at 06/30/2022 0600 Gross per 24 hour  Intake 4726.41 ml  Output 500 ml  Net 4226.41 ml   Filed Weights   06/29/22 1017  Weight: 68 kg    Examination:  Awake Alert, Oriented X 3, No new F.N deficits, Normal affect Symmetrical Chest wall movement, Good air movement bilaterally, CTAB RRR,No Gallops,Rubs or new Murmurs, No Parasternal Heave +ve B.Sounds, Abd Soft, No tenderness, No rebound - guarding or rigidity. No Cyanosis, Clubbing or edema, No new Rash or bruise      Data Reviewed: I have personally reviewed following labs and imaging studies  CBC: Recent Labs  Lab 06/29/22 1027 06/30/22 0554  WBC 5.2 2.1*  NEUTROABS 3.6  --   HGB 14.9 12.1*  HCT 44.0 34.8*  MCV 94.2 92.1  PLT 183 172    Basic Metabolic Panel: Recent Labs  Lab 06/29/22 1027 06/29/22 1308 06/30/22 0554  NA 139 139 137  K 3.3* 3.2* 3.5  CL 103 107 107  CO2 24 23 20*  GLUCOSE 152* 114* 235*  BUN 45* 43* 37*  CREATININE 2.36* 2.04* 1.64*  CALCIUM 8.2* 7.4* 8.3*  MG  --   --  2.1    GFR: Estimated Creatinine Clearance: 36.9 mL/min (A) (by C-G formula based on SCr of 1.64 mg/dL (H)).  Liver Function Tests: Recent Labs  Lab 06/29/22 1027 06/30/22 0554  AST 19 14*  ALT 14 14  ALKPHOS 62 46  BILITOT 0.8 0.4  PROT 6.7 5.4*  ALBUMIN 3.3* 2.7*    CBG: Recent Labs  Lab 06/29/22 2126 06/30/22 0920  GLUCAP 199* 182*     No results found for this or any previous visit (from the past 240 hour(s)).       Radiology Studies: CT ABDOMEN PELVIS W CONTRAST  Result Date: 06/29/2022 CLINICAL DATA:  Left lower quadrant abdominal pain, diarrhea, and nausea EXAM: CT ABDOMEN AND PELVIS WITH CONTRAST TECHNIQUE: Multidetector CT imaging of the  abdomen and pelvis was performed using the standard protocol following bolus administration of intravenous contrast. RADIATION DOSE REDUCTION: This exam was performed according to the departmental dose-optimization program which includes automated exposure control, adjustment of the mA and/or kV according to patient size and/or use of iterative reconstruction technique. CONTRAST:  65mL OMNIPAQUE IOHEXOL 350 MG/ML SOLN COMPARISON:  None Available. FINDINGS: Lower chest: Please see separately reported examination of the chest. Hepatobiliary: No solid liver abnormality is seen. Small gallstones. No gallbladder wall thickening, or biliary dilatation. Pancreas: Unremarkable. No pancreatic ductal dilatation or surrounding inflammatory changes. Spleen: Normal in size without significant abnormality. Adrenals/Urinary Tract: Benign non nodular adenomatous thickening of the bilateral adrenal glands, for which no further follow-up or characterization is required. Kidneys are normal, without renal calculi, solid lesion, or hydronephrosis. Bladder is unremarkable. Stomach/Bowel: Stomach is within normal limits. Appendix appears normal. No evidence of bowel wall thickening, distention, or inflammatory changes. Descending and sigmoid diverticulosis. Suspect mild wall thickening and  fat stranding of the distal sigmoid colon (series 6, image 67). Vascular/Lymphatic: Severe aortic atherosclerosis. Infrarenal abdominal aortic aneurysm measuring up to 3.2 x 3.2 cm in caliber. No enlarged abdominal or pelvic lymph nodes. Reproductive: No mass or other significant abnormality. Other: Small, fat containing umbilical hernia. Trace free fluid in the low pelvis (series 6, image 68) Musculoskeletal: No acute or significant osseous findings. IMPRESSION: 1. Descending and sigmoid diverticulosis. Suspect mild wall thickening and fat stranding about the distal sigmoid colon, suggestive of mild diverticulitis. No evidence of complicating  perforation or abscess at this time. 2. Trace free fluid in the low pelvis, most likely reactive. 3. Cholelithiasis without evidence of acute cholecystitis. 4. Infrarenal abdominal aortic aneurysm measuring up to 3.2 cm in caliber. Recommend follow-up ultrasound every 3 years if not otherwise imaged and if clinically appropriate. This recommendation follows ACR consensus guidelines: White Paper of the ACR Incidental Findings Committee II on Vascular Findings. J Am Coll Radiol 2013; 10:789-794. Aortic Atherosclerosis (ICD10-I70.0). Electronically Signed   By: Jearld Lesch M.D.   On: 06/29/2022 15:33   CT Angio Chest PE W and/or Wo Contrast  Result Date: 06/29/2022 CLINICAL DATA:  PE suspected EXAM: CT ANGIOGRAPHY CHEST WITH CONTRAST TECHNIQUE: Multidetector CT imaging of the chest was performed using the standard protocol during bolus administration of intravenous contrast. Multiplanar CT image reconstructions and MIPs were obtained to evaluate the vascular anatomy. RADIATION DOSE REDUCTION: This exam was performed according to the departmental dose-optimization program which includes automated exposure control, adjustment of the mA and/or kV according to patient size and/or use of iterative reconstruction technique. CONTRAST:  65mL OMNIPAQUE IOHEXOL 350 MG/ML SOLN COMPARISON:  03/10/2021 FINDINGS: Cardiovascular: Satisfactory opacification of the pulmonary arteries to the segmental level. No evidence of pulmonary embolism. Normal heart size. Three-vessel coronary artery calcifications. No pericardial effusion. Aortic atherosclerosis. Mediastinum/Nodes: No enlarged mediastinal, hilar, or axillary lymph nodes. Thyroid gland, trachea, and esophagus demonstrate no significant findings. Lungs/Pleura: Moderate centrilobular and paraseptal emphysema. Pulmonary hyperinflation. Diffuse bilateral bronchial wall thickening. Mild heterogeneous consolidative airspace opacity of the right-greater-than-left lung bases (series  4, image 143). No pleural effusion or pneumothorax. Upper Abdomen: Please see separately reported examination of the abdomen and pelvis. Musculoskeletal: No chest wall abnormality. No acute osseous findings. Review of the MIP images confirms the above findings. IMPRESSION: 1. Negative examination for pulmonary embolism. 2. Mild heterogeneous and consolidative airspace opacity of the right-greater-than-left lung bases, consistent with infection or aspiration. 3. Emphysema and diffuse bilateral bronchial wall thickening. 4. Coronary artery disease. Aortic Atherosclerosis (ICD10-I70.0) and Emphysema (ICD10-J43.9). Electronically Signed   By: Jearld Lesch M.D.   On: 06/29/2022 15:21   DG Chest Portable 1 View  Result Date: 06/29/2022 CLINICAL DATA:  COVID-19 diagnosed 3 days ago. Decreased p.o. intake and diarrhea. Nonproductive cough. Small amount of vomiting. Smoker. EXAM: PORTABLE CHEST 1 VIEW COMPARISON:  12/08/2019 FINDINGS: Normal sized heart. The lungs are clear and hyperexpanded with mild peribronchial thickening. Unremarkable bones. IMPRESSION: No acute abnormality. Mild changes of COPD and chronic bronchitis. Electronically Signed   By: Beckie Salts M.D.   On: 06/29/2022 11:24        Scheduled Meds:  apixaban  5 mg Oral BID   escitalopram  20 mg Oral Daily   insulin aspart  0-5 Units Subcutaneous QHS   insulin aspart  0-9 Units Subcutaneous TID WC   methylPREDNISolone (SOLU-MEDROL) injection  0.5 mg/kg Intravenous Q12H   Followed by   Melene Muller ON 07/03/2022] predniSONE  50 mg Oral Daily  rosuvastatin  40 mg Oral QHS   saccharomyces boulardii  250 mg Oral BID   sodium chloride flush  3 mL Intravenous Q12H   Continuous Infusions:  azithromycin Stopped (06/29/22 2142)   cefTRIAXone (ROCEPHIN)  IV     metronidazole 500 mg (06/30/22 1026)   remdesivir 200 mg in sodium chloride 0.9% 250 mL IVPB     Followed by   Melene Muller ON 07/01/2022] remdesivir 100 mg in sodium chloride 0.9 % 100 mL IVPB        LOS: 1 day      Huey Bienenstock, MD [facility]   To contact the attending provider between 7A-7P or the covering provider during after hours 7P-7A, please log into the web site [email or web address] and access using universal [place] password for that web site. If you do not have the password, please call the hospital operator.  06/30/2022, 11:00 AM

## 2022-06-30 NOTE — TOC CM/SW Note (Signed)
Transition of Care [facility]) - Inpatient Brief Assessment   Patient Details  Name: John Woodward. MRN: [id number] Date of Birth: 05-27-1945  Transition of Care [facility]) CM/SW Contact:    Gordy Clement, RN Phone Number: 06/30/2022, 9:59 AM   Clinical Narrative:  Patient presents to ED from home where he lives with his Wife- Covid+/ PNU.  IVABX  TOC will continue to follow patient for any additional discharge needs      Transition of Care Asessment: Insurance and Status: Insurance coverage has been reviewed Patient has primary care physician: Yes Beverely Low, Helane Rima, MD) Home environment has been reviewed: Home with Spouse Prior level of function:: independent Prior/Current Home Services: No current home services Social Determinants of Health Reivew: SDOH reviewed no interventions necessary Readmission risk has been reviewed: Yes (14%) Transition of care needs: transition of care needs identified, TOC will continue to follow

## 2022-07-01 ENCOUNTER — Other Ambulatory Visit (HOSPITAL_COMMUNITY): Payer: Self-pay

## 2022-07-01 DIAGNOSIS — J1282 Pneumonia due to coronavirus disease 2019: Secondary | ICD-10-CM | POA: Diagnosis not present

## 2022-07-01 DIAGNOSIS — U071 COVID-19: Secondary | ICD-10-CM | POA: Diagnosis not present

## 2022-07-01 DIAGNOSIS — I480 Paroxysmal atrial fibrillation: Secondary | ICD-10-CM | POA: Diagnosis not present

## 2022-07-01 DIAGNOSIS — Z7901 Long term (current) use of anticoagulants: Secondary | ICD-10-CM | POA: Diagnosis not present

## 2022-07-01 DIAGNOSIS — I959 Hypotension, unspecified: Secondary | ICD-10-CM | POA: Diagnosis not present

## 2022-07-01 LAB — BASIC METABOLIC PANEL
Anion gap: 10 (ref 5–15)
BUN: 31 mg/dL — ABNORMAL HIGH (ref 8–23)
CO2: 22 mmol/L (ref 22–32)
Calcium: 8.5 mg/dL — ABNORMAL LOW (ref 8.9–10.3)
Chloride: 105 mmol/L (ref 98–111)
Creatinine, Ser: 1.41 mg/dL — ABNORMAL HIGH (ref 0.61–1.24)
GFR, Estimated: 52 mL/min — ABNORMAL LOW (ref >60)
Glucose, Bld: 207 mg/dL — ABNORMAL HIGH (ref 70–99)
Potassium: 3.8 mmol/L (ref 3.5–5.1)
Sodium: 137 mmol/L (ref 135–145)

## 2022-07-01 LAB — CALCIUM, IONIZED: Calcium, Ionized, Serum: 5 mg/dL (ref 4.5–5.6)

## 2022-07-01 LAB — CBC
HCT: 35.6 % — ABNORMAL LOW (ref 39.0–52.0)
Hemoglobin: 12.2 g/dL — ABNORMAL LOW (ref 13.0–17.0)
MCH: 31.5 pg (ref 26.0–34.0)
MCHC: 34.3 g/dL (ref 30.0–36.0)
MCV: 92 fL (ref 80.0–100.0)
Platelets: 198 10*3/uL (ref 150–400)
RBC: 3.87 MIL/uL — ABNORMAL LOW (ref 4.22–5.81)
RDW: 13 % (ref 11.5–15.5)
WBC: 8.8 10*3/uL (ref 4.0–10.5)
nRBC: 0 % (ref 0.0–0.2)

## 2022-07-01 LAB — GLUCOSE, CAPILLARY: Glucose-Capillary: 198 mg/dL — ABNORMAL HIGH (ref 70–99)

## 2022-07-01 MED ORDER — AMOXICILLIN-POT CLAVULANATE 875-125 MG PO TABS
1.0000 | ORAL_TABLET | Freq: Two times a day (BID) | ORAL | 0 refills | Status: AC
  Filled 2022-07-01: qty 6, 3d supply, fill #0

## 2022-07-01 MED ORDER — PREDNISONE 10 MG PO TABS
ORAL_TABLET | ORAL | 0 refills | Status: DC
  Filled 2022-07-01: qty 10, 4d supply, fill #0

## 2022-07-01 MED ORDER — HYDROCHLOROTHIAZIDE 12.5 MG PO CAPS: 12.5000 mg | ORAL_CAPSULE | Freq: Every day | ORAL | 3 refills | Status: DC

## 2022-07-01 MED ORDER — HYDRALAZINE HCL 50 MG PO TABS: 50.0000 mg | ORAL_TABLET | Freq: Three times a day (TID) | ORAL | 2 refills | Status: DC

## 2022-07-01 MED ORDER — TELMISARTAN 80 MG PO TABS: 80.0000 mg | ORAL_TABLET | Freq: Every day | ORAL | 3 refills | Status: DC

## 2022-07-01 NOTE — Evaluation (Signed)
Physical Therapy Evaluation Patient Details Name: John Woodward. MRN: [id number] DOB: 11/07/1945 Today's Date: 07/01/2022  History of Present Illness  77 y/o M admitted to [facility] on 6/3 for fever, cough, malaise, and several episodes of diarrhea. Found to have COVID-19 infection and AKI. CT showing mild diverticulitis. PMHx: HTN, HLD, PAF on Eliquis, COPD  Clinical Impression  Pt presents today functioning at or close to his baseline. Pt able to perform all mobility with mod I to supervision, no overt LOB. Pt denies any lightheadedness with mobility, denies fatigue or feeling SOB with ambulation on room air. Pt denies any concerns with stair negotiation at discharge, reports feeling back to his baseline with no concerns with mobility upon discharge. Pt has no further benefit from skilled acute PT at this time, recommend return home with no current need for PT upon discharge. Acute PT will sign off.        Recommendations for follow up therapy are one component of a multi-disciplinary discharge planning process, led by the attending physician.  Recommendations may be updated based on patient status, additional functional criteria and insurance authorization.  Follow Up Recommendations       Assistance Recommended at Discharge PRN  Patient can return home with the following  Help with stairs or ramp for entrance;Assistance with cooking/housework    Equipment Recommendations None recommended by PT  Recommendations for Other Services       Functional Status Assessment Patient has had a recent decline in their functional status and demonstrates the ability to make significant improvements in function in a reasonable and predictable amount of time.     Precautions / Restrictions Precautions Precautions: Fall;Other (comment) Precaution Comments: watch BP, COVID precautions Restrictions Weight Bearing Restrictions: No      Mobility  Bed Mobility Overal bed mobility: Modified  Independent             General bed mobility comments: HOB elevated    Transfers Overall transfer level: Modified independent Equipment used: None                    Ambulation/Gait Ambulation/Gait assistance: Supervision Gait Distance (Feet): 150 Feet Assistive device: None Gait Pattern/deviations: Step-through pattern, Decreased stride length, Drifts right/left Gait velocity: decreased     General Gait Details: slowed gait with mild path deviaiton but no overt LOB, no fatigue or SOB  Stairs            Wheelchair Mobility    Modified Rankin (Stroke Patients Only)       Balance Overall balance assessment: Mild deficits observed, not formally tested                                           Pertinent Vitals/Pain Pain Assessment Pain Assessment: No/denies pain    Home Living Family/patient expects to be discharged to:: Private residence Living Arrangements: Children;Spouse/significant other Available Help at Discharge: Family;Available 24 hours/day Type of Home: House Home Access: Stairs to enter Entrance Stairs-Rails: Doctor, general practice of Steps: 5   Home Layout: One level Home Equipment: Agricultural consultant (2 wheels);Cane - single point;Shower seat      Prior Function Prior Level of Function : Independent/Modified Independent;Driving             Mobility Comments: independent with all mobility, one fall causing this admission due to hypotension ADLs Comments: independent  Hand Dominance   Dominant Hand: Right    Extremity/Trunk Assessment   Upper Extremity Assessment Upper Extremity Assessment: Overall WFL for tasks assessed    Lower Extremity Assessment Lower Extremity Assessment: Overall WFL for tasks assessed    Cervical / Trunk Assessment Cervical / Trunk Assessment: Normal  Communication   Communication: HOH  Cognition Arousal/Alertness: Awake/alert Behavior During Therapy: WFL for  tasks assessed/performed Overall Cognitive Status: Within Functional Limits for tasks assessed                                 General Comments: A&Ox3, reporting year as 2023 but reports he does not keep up with the date as he is retired. Pt pleasant throughout session, following commands and answering questions well.        General Comments General comments (skin integrity, edema, etc.): VSS on room air, mild cough once back in the room but resolving    Exercises     Assessment/Plan    PT Assessment Patient does not need any further PT services  PT Problem List         PT Treatment Interventions      PT Goals (Current goals can be found in the Care Plan section)  Acute Rehab PT Goals Patient Stated Goal: go home PT Goal Formulation: All assessment and education complete, DC therapy    Frequency       Co-evaluation               AM-PAC PT "6 Clicks" Mobility  Outcome Measure Help needed turning from your back to your side while in a flat bed without using bedrails?: None Help needed moving from lying on your back to sitting on the side of a flat bed without using bedrails?: None Help needed moving to and from a bed to a chair (including a wheelchair)?: None Help needed standing up from a chair using your arms (e.g., wheelchair or bedside chair)?: None Help needed to walk in hospital room?: A Little Help needed climbing 3-5 steps with a railing? : A Little 6 Click Score: 22    End of Session   Activity Tolerance: Patient tolerated treatment well Patient left: in bed;with call bell/phone within reach Nurse Communication: Mobility status PT Visit Diagnosis: Other abnormalities of gait and mobility (R26.89)    Time: 0981-1914 PT Time Calculation (min) (ACUTE ONLY): 12 min   Charges:   PT Evaluation $PT Eval Low Complexity: 1 Low          Lindalou Hose, PT DPT Acute Rehabilitation Services Office [phone]   Leonie Man 07/01/2022, 2:01  PM

## 2022-07-01 NOTE — Discharge Summary (Signed)
PATIENT DETAILS Name: John Woodward. Age: 77 y.o. Sex: male Date of Birth: November 02, 1945 MRN: [id number]. Admitting Physician: Clydie Braun, MD [id number], Helane Rima, MD  Admit Date: 06/29/2022 Discharge date: 07/01/2022  Recommendations for Outpatient Follow-up:  Follow up with PCP in 1-2 weeks Please obtain CMP/CBC in one week  Admitted From:  Home  Disposition: Home   Discharge Condition: good  CODE STATUS:   Code Status: Full Code   Diet recommendation:  Diet Order             Diet - low sodium heart healthy           Diet Heart Room service appropriate? Yes; Fluid consistency: Thin  Diet effective now                    Brief Summary: 77 year old with history of HTN, HLD, PAF on Eliquis, COPD who presented with fever, cough, malaise, several episodes of diarrhea.  He was found to have COVID-19 infection-AKI-CT showed mild diverticulitis-and was subsequently admitted to the hospitalist service.  Brief Hospital Course: COVID-19 pneumonia Rapidly improved-he is currently on room air-feels much better and is requesting discharge. Do not think he requires Remdesivir any further Will taper off steroids over the next several days He was empirically started on Rocephin/Zithromax-he also has some CT evidence of diverticulitis-will plan on a few days of Augmentin on discharge.  Transient hypotension This was with EMS-probably due to hypovolemia due to diarrhea-BP has now stabilized and is creeping up.  AKI on CKD stage IIIb Hemodynamically mediated-improved with supportive care-back to baseline  Mild diverticulitis Did have some diarrhea-no major symptoms-will be empirically covered with antibiotics on discharge in any event.  Does not have any abdominal pain on deep palpation.  PAF Maintaining sinus rhythm Continue Eliquis  DM-2 Maintain on SSI while inpatient Resume metformin on discharge  HTN Initially had soft BP but now creeping up-will  resume amlodipine started today, and resume hydralazine/Micardis over the next several days.  Follow-up with EP for further optimization.  History of CVA Continue Eliquis/Crestor Nonfocal exam  HLD Statin  Depression Stable Lexapro  Infrarenal AAA Radiology recommending follow-up ultrasound every 3 years.   Note-spouse called on the day of discharge  BMI: Estimated body mass index is 20.92 kg/m as calculated from the following:   Height as of this encounter: 5\' 11"  (1.803 m).   Weight as of this encounter: 68 kg.   Discharge Diagnoses:  Principal Problem:   Pneumonia due to COVID-19 virus Active Problems:   Transient hypotension   Paroxysmal atrial fibrillation (HCC)   Chronic anticoagulation   Acute kidney injury superimposed on chronic kidney disease (HCC)   Hypokalemia   Well controlled diabetes mellitus (HCC)   Depression, recurrent (HCC)   History of CVA (cerebrovascular accident)   Hyperlipidemia associated with type 2 diabetes mellitus (HCC)   Tobacco abuse disorder   Discharge Instructions:  Activity:  As tolerated  Discharge Instructions     Call MD for:  difficulty breathing, headache or visual disturbances   Complete by: As directed    Call MD for:  extreme fatigue   Complete by: As directed    Call MD for:  persistant dizziness or light-headedness   Complete by: As directed    Diet - low sodium heart healthy   Complete by: As directed    Discharge instructions   Complete by: As directed    Follow with Primary MD  Sheliah Hatch, MD  in 1-2 weeks  Incidental finding-you have found to have a infrarenal abdominal aortic aneurysm-recommendations are that you pursue an ultrasound every 3 years.  Your blood pressure was slightly low when you came in-your blood pressure medications will be staggered and resumed over the next several days-please see medications listed on the after visit summary.  Please get a complete blood count and chemistry  panel checked by your Primary MD at your next visit, and again as instructed by your Primary MD.  Get Medicines reviewed and adjusted: Please take all your medications with you for your next visit with your Primary MD  Laboratory/radiological data: Please request your Primary MD to go over all hospital tests and procedure/radiological results at the follow up, please ask your Primary MD to get all Hospital records sent to his/her office.  In some cases, they will be blood work, cultures and biopsy results pending at the time of your discharge. Please request that your primary care M.D. follows up on these results.  Also Note the following: If you experience worsening of your admission symptoms, develop shortness of breath, life threatening emergency, suicidal or homicidal thoughts you must seek medical attention immediately by calling 911 or calling your MD immediately  if symptoms less severe.  You must read complete instructions/literature along with all the possible adverse reactions/side effects for all the Medicines you take and that have been prescribed to you. Take any new Medicines after you have completely understood and accpet all the possible adverse reactions/side effects.   Do not drive when taking Pain medications or sleeping medications (Benzodaizepines)  Do not take more than prescribed Pain, Sleep and Anxiety Medications. It is not advisable to combine anxiety,sleep and pain medications without talking with your primary care practitioner  Special Instructions: If you have smoked or chewed Tobacco  in the last 2 yrs please stop smoking, stop any regular Alcohol  and or any Recreational drug use.  Wear Seat belts while driving.  Please note: You were cared for by a hospitalist during your hospital stay. Once you are discharged, your primary care physician will handle any further medical issues. Please note that NO REFILLS for any discharge medications will be authorized once  you are discharged, as it is imperative that you return to your primary care physician (or establish a relationship with a primary care physician if you do not have one) for your post hospital discharge needs so that they can reassess your need for medications and monitor your lab values.   Increase activity slowly   Complete by: As directed       Allergies as of 07/01/2022       Reactions   Antibacterial Hand Soap [triclosan] Other (See Comments)   Caused water blisters        Medication List     TAKE these medications    amLODipine 10 MG tablet Commonly known as: NORVASC TAKE 1 TABLET BY MOUTH EVERY DAY   amoxicillin-clavulanate 875-125 MG tablet Commonly known as: AUGMENTIN Take 1 tablet by mouth 2 (two) times daily for 3 days.   Eliquis 5 MG Tabs tablet Generic drug: apixaban TAKE ONE TABLET BY MOUTH TWICE DAILY   escitalopram 20 MG tablet Commonly known as: Lexapro Take 1 tablet (20 mg total) by mouth daily.   hydrALAZINE 50 MG tablet Commonly known as: APRESOLINE Take 1 tablet (50 mg total) by mouth 3 (three) times daily. Start taking on: July 03, 2022 What changed: These instructions start on July 03, 2022.  If you are unsure what to do until then, ask your doctor or other care provider.   hydrochlorothiazide 12.5 MG capsule Commonly known as: MICROZIDE Take 1 capsule (12.5 mg total) by mouth daily. Start taking on: July 02, 2022   ibuprofen 200 MG tablet Commonly known as: ADVIL Take 400 mg by mouth daily.   metFORMIN 500 MG tablet Commonly known as: GLUCOPHAGE Take one tablet every morning and take two tablets by mouth every evening What changed:  how much to take how to take this when to take this   nicotine polacrilex 4 MG lozenge Commonly known as: COMMIT Take 1 lozenge (4 mg total) by mouth as needed for up to 90 doses for smoking cessation.   predniSONE 10 MG tablet Commonly known as: DELTASONE Take 40 mg daily for 1 day, 30 mg daily for 1 day,  20 mg daily for 1 days,10 mg daily for 1 day, then stop   rosuvastatin 40 MG tablet Commonly known as: CRESTOR Take 1 tablet (40 mg total) by mouth at bedtime.   telmisartan 80 MG tablet Commonly known as: MICARDIS Take 1 tablet (80 mg total) by mouth daily. Start taking on: July 04, 2022 What changed: These instructions start on July 04, 2022. If you are unsure what to do until then, ask your doctor or other care provider.        Follow-up Information     Sheliah Hatch, MD. Schedule an appointment as soon as possible for a visit in 1 week(s).   Specialty: Family Medicine Contact information: [address] [place] [address] [phone]                Allergies  Allergen Reactions   Antibacterial Hand Soap [Triclosan] Other (See Comments)    Caused water blisters     Other Procedures/Studies: CT ABDOMEN PELVIS W CONTRAST  Result Date: 06/29/2022 CLINICAL DATA:  Left lower quadrant abdominal pain, diarrhea, and nausea EXAM: CT ABDOMEN AND PELVIS WITH CONTRAST TECHNIQUE: Multidetector CT imaging of the abdomen and pelvis was performed using the standard protocol following bolus administration of intravenous contrast. RADIATION DOSE REDUCTION: This exam was performed according to the departmental dose-optimization program which includes automated exposure control, adjustment of the mA and/or kV according to patient size and/or use of iterative reconstruction technique. CONTRAST:  65mL OMNIPAQUE IOHEXOL 350 MG/ML SOLN COMPARISON:  None Available. FINDINGS: Lower chest: Please see separately reported examination of the chest. Hepatobiliary: No solid liver abnormality is seen. Small gallstones. No gallbladder wall thickening, or biliary dilatation. Pancreas: Unremarkable. No pancreatic ductal dilatation or surrounding inflammatory changes. Spleen: Normal in size without significant abnormality. Adrenals/Urinary Tract: Benign non nodular adenomatous thickening of the  bilateral adrenal glands, for which no further follow-up or characterization is required. Kidneys are normal, without renal calculi, solid lesion, or hydronephrosis. Bladder is unremarkable. Stomach/Bowel: Stomach is within normal limits. Appendix appears normal. No evidence of bowel wall thickening, distention, or inflammatory changes. Descending and sigmoid diverticulosis. Suspect mild wall thickening and fat stranding of the distal sigmoid colon (series 6, image 67). Vascular/Lymphatic: Severe aortic atherosclerosis. Infrarenal abdominal aortic aneurysm measuring up to 3.2 x 3.2 cm in caliber. No enlarged abdominal or pelvic lymph nodes. Reproductive: No mass or other significant abnormality. Other: Small, fat containing umbilical hernia. Trace free fluid in the low pelvis (series 6, image 68) Musculoskeletal: No acute or significant osseous findings. IMPRESSION: 1. Descending and sigmoid diverticulosis. Suspect mild wall thickening and fat stranding about the  distal sigmoid colon, suggestive of mild diverticulitis. No evidence of complicating perforation or abscess at this time. 2. Trace free fluid in the low pelvis, most likely reactive. 3. Cholelithiasis without evidence of acute cholecystitis. 4. Infrarenal abdominal aortic aneurysm measuring up to 3.2 cm in caliber. Recommend follow-up ultrasound every 3 years if not otherwise imaged and if clinically appropriate. This recommendation follows ACR consensus guidelines: White Paper of the ACR Incidental Findings Committee II on Vascular Findings. J Am Coll Radiol 2013; 10:789-794. Aortic Atherosclerosis (ICD10-I70.0). Electronically Signed   By: Jearld Lesch M.D.   On: 06/29/2022 15:33   CT Angio Chest PE W and/or Wo Contrast  Result Date: 06/29/2022 CLINICAL DATA:  PE suspected EXAM: CT ANGIOGRAPHY CHEST WITH CONTRAST TECHNIQUE: Multidetector CT imaging of the chest was performed using the standard protocol during bolus administration of intravenous  contrast. Multiplanar CT image reconstructions and MIPs were obtained to evaluate the vascular anatomy. RADIATION DOSE REDUCTION: This exam was performed according to the departmental dose-optimization program which includes automated exposure control, adjustment of the mA and/or kV according to patient size and/or use of iterative reconstruction technique. CONTRAST:  65mL OMNIPAQUE IOHEXOL 350 MG/ML SOLN COMPARISON:  03/10/2021 FINDINGS: Cardiovascular: Satisfactory opacification of the pulmonary arteries to the segmental level. No evidence of pulmonary embolism. Normal heart size. Three-vessel coronary artery calcifications. No pericardial effusion. Aortic atherosclerosis. Mediastinum/Nodes: No enlarged mediastinal, hilar, or axillary lymph nodes. Thyroid gland, trachea, and esophagus demonstrate no significant findings. Lungs/Pleura: Moderate centrilobular and paraseptal emphysema. Pulmonary hyperinflation. Diffuse bilateral bronchial wall thickening. Mild heterogeneous consolidative airspace opacity of the right-greater-than-left lung bases (series 4, image 143). No pleural effusion or pneumothorax. Upper Abdomen: Please see separately reported examination of the abdomen and pelvis. Musculoskeletal: No chest wall abnormality. No acute osseous findings. Review of the MIP images confirms the above findings. IMPRESSION: 1. Negative examination for pulmonary embolism. 2. Mild heterogeneous and consolidative airspace opacity of the right-greater-than-left lung bases, consistent with infection or aspiration. 3. Emphysema and diffuse bilateral bronchial wall thickening. 4. Coronary artery disease. Aortic Atherosclerosis (ICD10-I70.0) and Emphysema (ICD10-J43.9). Electronically Signed   By: Jearld Lesch M.D.   On: 06/29/2022 15:21   DG Chest Portable 1 View  Result Date: 06/29/2022 CLINICAL DATA:  COVID-19 diagnosed 3 days ago. Decreased p.o. intake and diarrhea. Nonproductive cough. Small amount of vomiting.  Smoker. EXAM: PORTABLE CHEST 1 VIEW COMPARISON:  12/08/2019 FINDINGS: Normal sized heart. The lungs are clear and hyperexpanded with mild peribronchial thickening. Unremarkable bones. IMPRESSION: No acute abnormality. Mild changes of COPD and chronic bronchitis. Electronically Signed   By: Beckie Salts M.D.   On: 06/29/2022 11:24     TODAY-DAY OF DISCHARGE:  Subjective:   Marzetta Merino today has no headache,no chest abdominal pain,no new weakness tingling or numbness, feels much better wants to go home today.   Objective:   Blood pressure 132/68, pulse 60, temperature 98 F (36.7 C), temperature source Oral, resp. rate 18, height 5\' 11"  (1.803 m), weight 68 kg, SpO2 96 %.  Intake/Output Summary (Last 24 hours) at 07/01/2022 0918 Last data filed at 06/30/2022 2052 Gross per 24 hour  Intake 100 ml  Output --  Net 100 ml   Filed Weights   06/29/22 1017  Weight: 68 kg    Exam: Awake Alert, Oriented *3, No new F.N deficits, Normal affect [place].AT,PERRAL Supple Neck,No JVD, No cervical lymphadenopathy appriciated.  Symmetrical Chest wall movement, Good air movement bilaterally, CTAB RRR,No Gallops,Rubs or new Murmurs, No Parasternal Heave +ve B.Sounds,  Abd Soft, Non tender, No organomegaly appriciated, No rebound -guarding or rigidity. No Cyanosis, Clubbing or edema, No new Rash or bruise   PERTINENT RADIOLOGIC STUDIES: CT ABDOMEN PELVIS W CONTRAST  Result Date: 06/29/2022 CLINICAL DATA:  Left lower quadrant abdominal pain, diarrhea, and nausea EXAM: CT ABDOMEN AND PELVIS WITH CONTRAST TECHNIQUE: Multidetector CT imaging of the abdomen and pelvis was performed using the standard protocol following bolus administration of intravenous contrast. RADIATION DOSE REDUCTION: This exam was performed according to the departmental dose-optimization program which includes automated exposure control, adjustment of the mA and/or kV according to patient size and/or use of iterative reconstruction  technique. CONTRAST:  65mL OMNIPAQUE IOHEXOL 350 MG/ML SOLN COMPARISON:  None Available. FINDINGS: Lower chest: Please see separately reported examination of the chest. Hepatobiliary: No solid liver abnormality is seen. Small gallstones. No gallbladder wall thickening, or biliary dilatation. Pancreas: Unremarkable. No pancreatic ductal dilatation or surrounding inflammatory changes. Spleen: Normal in size without significant abnormality. Adrenals/Urinary Tract: Benign non nodular adenomatous thickening of the bilateral adrenal glands, for which no further follow-up or characterization is required. Kidneys are normal, without renal calculi, solid lesion, or hydronephrosis. Bladder is unremarkable. Stomach/Bowel: Stomach is within normal limits. Appendix appears normal. No evidence of bowel wall thickening, distention, or inflammatory changes. Descending and sigmoid diverticulosis. Suspect mild wall thickening and fat stranding of the distal sigmoid colon (series 6, image 67). Vascular/Lymphatic: Severe aortic atherosclerosis. Infrarenal abdominal aortic aneurysm measuring up to 3.2 x 3.2 cm in caliber. No enlarged abdominal or pelvic lymph nodes. Reproductive: No mass or other significant abnormality. Other: Small, fat containing umbilical hernia. Trace free fluid in the low pelvis (series 6, image 68) Musculoskeletal: No acute or significant osseous findings. IMPRESSION: 1. Descending and sigmoid diverticulosis. Suspect mild wall thickening and fat stranding about the distal sigmoid colon, suggestive of mild diverticulitis. No evidence of complicating perforation or abscess at this time. 2. Trace free fluid in the low pelvis, most likely reactive. 3. Cholelithiasis without evidence of acute cholecystitis. 4. Infrarenal abdominal aortic aneurysm measuring up to 3.2 cm in caliber. Recommend follow-up ultrasound every 3 years if not otherwise imaged and if clinically appropriate. This recommendation follows ACR  consensus guidelines: White Paper of the ACR Incidental Findings Committee II on Vascular Findings. J Am Coll Radiol 2013; 10:789-794. Aortic Atherosclerosis (ICD10-I70.0). Electronically Signed   By: Jearld Lesch M.D.   On: 06/29/2022 15:33   CT Angio Chest PE W and/or Wo Contrast  Result Date: 06/29/2022 CLINICAL DATA:  PE suspected EXAM: CT ANGIOGRAPHY CHEST WITH CONTRAST TECHNIQUE: Multidetector CT imaging of the chest was performed using the standard protocol during bolus administration of intravenous contrast. Multiplanar CT image reconstructions and MIPs were obtained to evaluate the vascular anatomy. RADIATION DOSE REDUCTION: This exam was performed according to the departmental dose-optimization program which includes automated exposure control, adjustment of the mA and/or kV according to patient size and/or use of iterative reconstruction technique. CONTRAST:  65mL OMNIPAQUE IOHEXOL 350 MG/ML SOLN COMPARISON:  03/10/2021 FINDINGS: Cardiovascular: Satisfactory opacification of the pulmonary arteries to the segmental level. No evidence of pulmonary embolism. Normal heart size. Three-vessel coronary artery calcifications. No pericardial effusion. Aortic atherosclerosis. Mediastinum/Nodes: No enlarged mediastinal, hilar, or axillary lymph nodes. Thyroid gland, trachea, and esophagus demonstrate no significant findings. Lungs/Pleura: Moderate centrilobular and paraseptal emphysema. Pulmonary hyperinflation. Diffuse bilateral bronchial wall thickening. Mild heterogeneous consolidative airspace opacity of the right-greater-than-left lung bases (series 4, image 143). No pleural effusion or pneumothorax. Upper Abdomen: Please see separately reported examination  of the abdomen and pelvis. Musculoskeletal: No chest wall abnormality. No acute osseous findings. Review of the MIP images confirms the above findings. IMPRESSION: 1. Negative examination for pulmonary embolism. 2. Mild heterogeneous and consolidative  airspace opacity of the right-greater-than-left lung bases, consistent with infection or aspiration. 3. Emphysema and diffuse bilateral bronchial wall thickening. 4. Coronary artery disease. Aortic Atherosclerosis (ICD10-I70.0) and Emphysema (ICD10-J43.9). Electronically Signed   By: Jearld Lesch M.D.   On: 06/29/2022 15:21   DG Chest Portable 1 View  Result Date: 06/29/2022 CLINICAL DATA:  COVID-19 diagnosed 3 days ago. Decreased p.o. intake and diarrhea. Nonproductive cough. Small amount of vomiting. Smoker. EXAM: PORTABLE CHEST 1 VIEW COMPARISON:  12/08/2019 FINDINGS: Normal sized heart. The lungs are clear and hyperexpanded with mild peribronchial thickening. Unremarkable bones. IMPRESSION: No acute abnormality. Mild changes of COPD and chronic bronchitis. Electronically Signed   By: Beckie Salts M.D.   On: 06/29/2022 11:24     PERTINENT LAB RESULTS: CBC: Recent Labs    06/30/22 0554 07/01/22 0622  WBC 2.1* 8.8  HGB 12.1* 12.2*  HCT 34.8* 35.6*  PLT 172 198   CMET CMP     Component Value Date/Time   NA 137 07/01/2022 0622   NA 143 05/14/2022 0919   K 3.8 07/01/2022 0622   CL 105 07/01/2022 0622   CO2 22 07/01/2022 0622   GLUCOSE 207 (H) 07/01/2022 0622   BUN 31 (H) 07/01/2022 0622   BUN 21 05/14/2022 0919   CREATININE 1.41 (H) 07/01/2022 0622   CREATININE 1.16 02/02/2020 1109   CALCIUM 8.5 (L) 07/01/2022 0622   PROT 5.4 (L) 06/30/2022 0554   PROT 6.8 05/14/2022 0919   ALBUMIN 2.7 (L) 06/30/2022 0554   ALBUMIN 4.4 05/14/2022 0919   AST 14 (L) 06/30/2022 0554   ALT 14 06/30/2022 0554   ALKPHOS 46 06/30/2022 0554   BILITOT 0.4 06/30/2022 0554   BILITOT 0.3 05/14/2022 0919   GFR 47.74 (L) 01/27/2022 1343   EGFR 48 (L) 05/14/2022 0919   GFRNONAA 52 (L) 07/01/2022 0622    GFR Estimated Creatinine Clearance: 42.9 mL/min (A) (by C-G formula based on SCr of 1.41 mg/dL (H)). Recent Labs    06/29/22 1027  LIPASE 69*   No results for input(s): "CKTOTAL", "CKMB",  "CKMBINDEX", "TROPONINI" in the last 72 hours. Invalid input(s): "POCBNP" No results for input(s): "DDIMER" in the last 72 hours. No results for input(s): "HGBA1C" in the last 72 hours. No results for input(s): "CHOL", "HDL", "LDLCALC", "TRIG", "CHOLHDL", "LDLDIRECT" in the last 72 hours. No results for input(s): "TSH", "T4TOTAL", "T3FREE", "THYROIDAB" in the last 72 hours.  Invalid input(s): "FREET3" No results for input(s): "VITAMINB12", "FOLATE", "FERRITIN", "TIBC", "IRON", "RETICCTPCT" in the last 72 hours. Coags: No results for input(s): "INR" in the last 72 hours.  Invalid input(s): "PT" Microbiology: No results found for this or any previous visit (from the past 240 hour(s)).  FURTHER DISCHARGE INSTRUCTIONS:  Get Medicines reviewed and adjusted: Please take all your medications with you for your next visit with your Primary MD  Laboratory/radiological data: Please request your Primary MD to go over all hospital tests and procedure/radiological results at the follow up, please ask your Primary MD to get all Hospital records sent to his/her office.  In some cases, they will be blood work, cultures and biopsy results pending at the time of your discharge. Please request that your primary care M.D. goes through all the records of your hospital data and follows up on these results.  Also Note the following: If you experience worsening of your admission symptoms, develop shortness of breath, life threatening emergency, suicidal or homicidal thoughts you must seek medical attention immediately by calling 911 or calling your MD immediately  if symptoms less severe.  You must read complete instructions/literature along with all the possible adverse reactions/side effects for all the Medicines you take and that have been prescribed to you. Take any new Medicines after you have completely understood and accpet all the possible adverse reactions/side effects.   Do not drive when taking Pain  medications or sleeping medications (Benzodaizepines)  Do not take more than prescribed Pain, Sleep and Anxiety Medications. It is not advisable to combine anxiety,sleep and pain medications without talking with your primary care practitioner  Special Instructions: If you have smoked or chewed Tobacco  in the last 2 yrs please stop smoking, stop any regular Alcohol  and or any Recreational drug use.  Wear Seat belts while driving.  Please note: You were cared for by a hospitalist during your hospital stay. Once you are discharged, your primary care physician will handle any further medical issues. Please note that NO REFILLS for any discharge medications will be authorized once you are discharged, as it is imperative that you return to your primary care physician (or establish a relationship with a primary care physician if you do not have one) for your post hospital discharge needs so that they can reassess your need for medications and monitor your lab values.  Total Time spent coordinating discharge including counseling, education and face to face time equals greater than 30 minutes.  SignedJeoffrey Massed 07/01/2022 9:18 AM

## 2022-07-01 NOTE — TOC Transition Note (Signed)
Transition of Care [facility]) - CM/SW Discharge Note   Patient Details  Name: John Woodward. MRN: [id number] Date of Birth: [birth year]  [facility] CM/SW Contact:  Gordy Clement, RN Phone Number: 07/01/2022, 9:42 AM   Clinical Narrative:     Patient will DC to home today.  No TOC needs identified.  Family will transport          Patient Goals and CMS Choice      Discharge Placement                         Discharge Plan and Services Additional resources added to the After Visit Summary for                                       Social Determinants of Health (SDOH) Interventions SDOH Screenings   Food Insecurity: No Food Insecurity (06/29/2022)  Housing: Low Risk  (06/29/2022)  Transportation Needs: No Transportation Needs (06/29/2022)  Utilities: Not At Risk (06/29/2022)  Alcohol Screen: Low Risk  (04/16/2022)  Depression (PHQ2-9): Low Risk  (04/16/2022)  Financial Resource Strain: Low Risk  (04/16/2022)  Physical Activity: Inactive (04/16/2022)  Social Connections: Moderately Integrated (04/16/2022)  Stress: No Stress Concern Present (04/16/2022)  Tobacco Use: High Risk (06/29/2022)     Readmission Risk Interventions     No data to display

## 2022-07-02 ENCOUNTER — Telehealth: Payer: Self-pay

## 2022-07-02 ENCOUNTER — Ambulatory Visit: Payer: Self-pay

## 2022-07-02 NOTE — Transitions of Care (Post Inpatient/ED Visit) (Signed)
07/02/2022  Name: John Woodward. MRN: [id number] DOB: 09-Mar-1945  Today's TOC FU Call Status: Today's TOC FU Call Status:: Successful TOC FU Call Competed TOC FU Call Complete Date: 07/02/22  Transition Care Management Follow-up Telephone Call Date of Discharge: 07/01/22 Discharge Facility: [facility]) Type of Discharge: Inpatient Admission Primary Inpatient Discharge Diagnosis:: "AKI,PNA due to COVID-19 virus" How have you been since you were released from the hospital?: Better (spoke w/ wife-her, pt along with son that lives with them all got COVID and are receovering/improving.Pt slept well & napping now. Appetite decreased.LBM today-had a losse stool-she will monitor-states recent dx of diverticulitis. Denies any resp sxs.) Any questions or concerns?: No  Items Reviewed: Did you receive and understand the discharge instructions provided?: Yes Medications obtained,verified, and reconciled?: Yes (Medications Reviewed) Reason for Partial Mediation Review: wife statesdaughter is a Hospital doctor filled med planner since returning home using d/c instructions Any new allergies since your discharge?: No Dietary orders reviewed?: Yes Type of Diet Ordered:: low salt, heart healthy Do you have support at home?: Yes People in Home: spouse, child(ren), adult Name of Support/Comfort Primary Source: pt lives with spouse and adult son  Medications Reviewed Today: Medications Reviewed Today     Reviewed by Charlyn Minerva, RN (Registered Nurse) on 07/02/22 at 1210  Med List Status: <None>   Medication Order Taking? Sig Documenting Provider Last Dose Status Informant  amLODipine (NORVASC) 10 MG tablet [id number] Yes TAKE 1 TABLET BY MOUTH EVERY DAY Tolia, Sunit, DO Taking Active Spouse/Significant Other, Child  amoxicillin-clavulanate (AUGMENTIN) 875-125 MG tablet [id number] Yes Take 1 tablet by mouth 2 (two) times daily for 3 days. Maretta Bees, MD Taking  Active   ELIQUIS 5 MG TABS tablet [id number] Yes TAKE ONE TABLET BY MOUTH TWICE DAILY Cantwell, Celeste C, PA-C Taking Active Spouse/Significant Other, Child  escitalopram (LEXAPRO) 20 MG tablet [id number] Yes Take 1 tablet (20 mg total) by mouth daily. Sheliah Hatch, MD Taking Active Spouse/Significant Other, Child  hydrALAZINE (APRESOLINE) 50 MG tablet [id number] Yes Take 1 tablet (50 mg total) by mouth 3 (three) times daily. Maretta Bees, MD Taking Active   hydrochlorothiazide (MICROZIDE) 12.5 MG capsule [id number] Yes Take 1 capsule (12.5 mg total) by mouth daily. Maretta Bees, MD Taking Active   ibuprofen (ADVIL) 200 MG tablet [id number] Yes Take 400 mg by mouth daily. [provider] Taking Active Spouse/Significant Other, Child  metFORMIN (GLUCOPHAGE) 500 MG tablet [id number] Yes Take one tablet every morning and take two tablets by mouth every evening  Patient taking differently: Take 500 mg by mouth daily with breakfast. Take one tablet every morning and take two tablets by mouth every evening   Sheliah Hatch, MD Taking Active Spouse/Significant Other, Child    Discontinued 02/16/20 0959 (Side effect (s))            Med Note (DODD, AMBER L   Tue Jul 15, 2021 10:00 AM)    nicotine polacrilex (COMMIT) 4 MG lozenge [id number] Yes Take 1 lozenge (4 mg total) by mouth as needed for up to 90 doses for smoking cessation. Tessa Lerner, DO Taking Active Spouse/Significant Other, Child  predniSONE (DELTASONE) 10 MG tablet [id number] Yes Take 4 tablets (40 mg total) daily for 1 day, then 3 tablets (30 mg total) daily for 1 day, then 2 tablets (20 mg total) daily for 1 day, then 1 tablet (10 mg total) daily for 1 day, then stop Ghimire, Werner Lean, MD Taking  Active   rosuvastatin (CRESTOR) 40 MG tablet [id number] Yes Take 1 tablet (40 mg total) by mouth at bedtime. Tessa Lerner, DO Taking Active Spouse/Significant Other, Child  telmisartan (MICARDIS) 80 MG tablet [id number] Yes Take  1 tablet (80 mg total) by mouth daily. Maretta Bees, MD Taking Active             Home Care and Equipment/Supplies: Were Home Health Services Ordered?: NA Any new equipment or medical supplies ordered?: NA  Functional Questionnaire: Do you need assistance with bathing/showering or dressing?: No Do you need assistance with meal preparation?: No Do you need assistance with eating?: No Do you have difficulty maintaining continence: No Do you need assistance with getting out of bed/getting out of a chair/moving?: No Do you have difficulty managing or taking your medications?: Yes (dtr fills med planner)  Follow up appointments reviewed: PCP Follow-up appointment confirmed?: Yes Date of PCP follow-up appointment?: 07/10/22 Follow-up Provider: Dr. Beverely Low Specialist [facility] Follow-up appointment confirmed?: NA Do you need transportation to your follow-up appointment?: No (spouse confirms her or their children are able to get pt to appts) Do you understand care options if your condition(s) worsen?: Yes-patient verbalized understanding  SDOH Interventions Today    Flowsheet Row Most Recent Value  SDOH Interventions   Food Insecurity Interventions Intervention Not Indicated  Transportation Interventions Intervention Not Indicated      TOC Interventions Today    Flowsheet Row Most Recent Value  TOC Interventions   TOC Interventions Discussed/Reviewed TOC Interventions Discussed, Arranged PCP follow up within 7 days/Care Guide scheduled  [pt already has appt with PCP on 07/10/22-care guide assisted with changing appt type to hosp f/u]      Interventions Today    Flowsheet Row Most Recent Value  General Interventions   General Interventions Discussed/Reviewed General Interventions Discussed, Doctor Visits  Doctor Visits Discussed/Reviewed Doctor Visits Discussed, PCP  PCP/Specialist Visits Compliance with follow-up visit  Education Interventions   Education Provided  Provided Education  Provided Verbal Education On Nutrition, When to see the doctor, Medication, Other  [smoking cessation-spouse states pt has not had cigarettes in about a week-family refuses to go buy him anymore-he is using nicotine lozenges]  Nutrition Interventions   Nutrition Discussed/Reviewed Nutrition Discussed, Adding fruits and vegetables, Decreasing salt, Decreasing sugar intake, Fluid intake  [wife voices pt with decreased appetite-has not ate anything today-encouraged nutritonal shakes/supplements-she has some in the home and will give to pt]  Pharmacy Interventions   Pharmacy Dicussed/Reviewed Pharmacy Topics Discussed, Medications and their functions  Safety Interventions   Safety Discussed/Reviewed Safety Discussed, Home Safety        Alessandra Grout [facility] Care Management Care Management Community Coordinator Direct Phone: [phone] Toll Free: [phone] Fax: [phone]

## 2022-07-02 NOTE — Chronic Care Management (AMB) (Signed)
   07/02/2022  John Woodward. 10-30-1945 [id number]   Reason for Encounter Patient no longer enrolled in the CCM program. CCM enrollment status changed to Previously enrolled.   [facility]/Chronic Care Management [phone]

## 2022-07-10 ENCOUNTER — Ambulatory Visit (INDEPENDENT_AMBULATORY_CARE_PROVIDER_SITE_OTHER): Payer: Medicare HMO | Admitting: Family Medicine

## 2022-07-10 ENCOUNTER — Encounter: Payer: Self-pay | Admitting: Family Medicine

## 2022-07-10 VITALS — BP 100/64 | HR 60 | Resp 16 | Ht 71.0 in | Wt 145.4 lb

## 2022-07-10 DIAGNOSIS — N189 Chronic kidney disease, unspecified: Secondary | ICD-10-CM | POA: Diagnosis not present

## 2022-07-10 DIAGNOSIS — J1282 Pneumonia due to coronavirus disease 2019: Secondary | ICD-10-CM

## 2022-07-10 DIAGNOSIS — N179 Acute kidney failure, unspecified: Secondary | ICD-10-CM | POA: Diagnosis not present

## 2022-07-10 DIAGNOSIS — I10 Essential (primary) hypertension: Secondary | ICD-10-CM | POA: Diagnosis not present

## 2022-07-10 LAB — BASIC METABOLIC PANEL
BUN: 25 mg/dL — ABNORMAL HIGH (ref 6–23)
CO2: 29 mEq/L (ref 19–32)
Calcium: 8.9 mg/dL (ref 8.4–10.5)
Chloride: 103 mEq/L (ref 96–112)
Creatinine, Ser: 1.78 mg/dL — ABNORMAL HIGH (ref 0.40–1.50)
GFR: 36.59 mL/min — ABNORMAL LOW (ref >60.00)
Glucose, Bld: 144 mg/dL — ABNORMAL HIGH (ref 70–99)
Potassium: 5.2 mEq/L — ABNORMAL HIGH (ref 3.5–5.1)
Sodium: 141 mEq/L (ref 135–145)

## 2022-07-10 LAB — CBC WITH DIFFERENTIAL/PLATELET
Basophils Absolute: 0.1 10*3/uL (ref 0.0–0.1)
Basophils Relative: 1.3 % (ref 0.0–3.0)
Eosinophils Absolute: 0.1 10*3/uL (ref 0.0–0.7)
Eosinophils Relative: 1.8 % (ref 0.0–5.0)
HCT: 40.6 % (ref 39.0–52.0)
Hemoglobin: 13.3 g/dL (ref 13.0–17.0)
Lymphocytes Relative: 16.8 % (ref 12.0–46.0)
Lymphs Abs: 1.1 10*3/uL (ref 0.7–4.0)
MCHC: 32.7 g/dL (ref 30.0–36.0)
MCV: 96.4 fl (ref 78.0–100.0)
Monocytes Absolute: 0.6 10*3/uL (ref 0.1–1.0)
Monocytes Relative: 8.9 % (ref 3.0–12.0)
Neutro Abs: 4.7 10*3/uL (ref 1.4–7.7)
Neutrophils Relative %: 71.2 % (ref 43.0–77.0)
Platelets: 298 10*3/uL (ref 150.0–400.0)
RBC: 4.21 Mil/uL — ABNORMAL LOW (ref 4.22–5.81)
RDW: 13.4 % (ref 11.5–15.5)
WBC: 6.6 10*3/uL (ref 4.0–10.5)

## 2022-07-10 LAB — HEPATIC FUNCTION PANEL
ALT: 16 U/L (ref 0–53)
AST: 19 U/L (ref 0–37)
Albumin: 3.6 g/dL (ref 3.5–5.2)
Alkaline Phosphatase: 67 U/L (ref 39–117)
Bilirubin, Direct: 0.1 mg/dL (ref 0.0–0.3)
Total Bilirubin: 0.6 mg/dL (ref 0.2–1.2)
Total Protein: 6.6 g/dL (ref 6.0–8.3)

## 2022-07-10 NOTE — Assessment & Plan Note (Signed)
Resolved.  Pt has completed abx and steroids.  Reports cough has resolved and he is no longer having SOB.  Continues to sleep quite a bit but unclear if this is related to memory issues, recent illness, hypotension, or all of the above.  Will follow.

## 2022-07-10 NOTE — Assessment & Plan Note (Signed)
Ongoing issue for pt.  His BP is over controlled today and in the setting of recent AKI, will stop HCTZ and monitor closely.  Pt and wife expressed understanding.

## 2022-07-10 NOTE — Patient Instructions (Signed)
Follow up in 3-4 weeks to recheck blood pressure We'll notify you of your lab results and make any changes if needed STOP the Hydrochlorothiazide Continue to drink LOTS of water Ease back into your activities and try and stay busy Call with any questions or concerns Hang in there!!!

## 2022-07-10 NOTE — Assessment & Plan Note (Signed)
New.  Pt's Cr was elevated at admission due to dehydration.  This normalized prior to D/C.  Will repeat today to ensure it remains in normal range.

## 2022-07-10 NOTE — Progress Notes (Signed)
   Subjective:    Patient ID: John Church., male    DOB: 1945/09/02, 77 y.o.   MRN: [id number]  HPI Hospital f/u- pt was admitted on 6/3 w/ COVID, AKI, and diverticulitis.  He received Remdesivir but was able to d/c.  He was started on steroids and then tapered.  He was switched from Rocephin/Zithromax to Augmentin for diverticulitis.    AKI improved w/ fluids.  Low BP normalized and he was restarted on Amlodipine, Hydralazine and Micardis  Reviewed ER notes, H&P, D/C summary along w/ labs, imaging, and progress notes.  Today wife reports he is sleeping 15 hrs at a time and then taking naps.  She says he can sleep until 2pm if allowed to.  Was typically up by 10am.  States he seems 'lost' and very forgetful since returning home on 6/5.  Pt reports feeling better.  Cough has resolved.  Denies SOB.  BP today is low at 100/64.  Pt admits to dizziness upon standing.  Wife is trying to push fluids.  Eating 'a lot of small meals'.   Review of Systems For ROS see HPI     Objective:   Physical Exam Vitals reviewed.  Constitutional:      General: He is not in acute distress.    Appearance: He is well-developed.     Comments: Thin but otherwise well appearing  HENT:     Head: Normocephalic and atraumatic.  Eyes:     Extraocular Movements: Extraocular movements intact.     Conjunctiva/sclera: Conjunctivae normal.     Pupils: Pupils are equal, round, and reactive to light.  Neck:     Thyroid: No thyromegaly.  Cardiovascular:     Rate and Rhythm: Normal rate. Rhythm irregular.     Pulses: Normal pulses.     Heart sounds: Normal heart sounds. No murmur heard. Pulmonary:     Effort: Pulmonary effort is normal. No respiratory distress.     Breath sounds: Normal breath sounds.  Abdominal:     General: Bowel sounds are normal. There is no distension.     Palpations: Abdomen is soft.  Musculoskeletal:     Cervical back: Normal range of motion and neck supple.     Right lower leg: No  edema.     Left lower leg: No edema.  Lymphadenopathy:     Cervical: No cervical adenopathy.  Skin:    General: Skin is warm and dry.  Neurological:     General: No focal deficit present.     Mental Status: He is alert and oriented to person, place, and time.     Cranial Nerves: No cranial nerve deficit.  Psychiatric:        Mood and Affect: Mood normal.        Behavior: Behavior normal.           Assessment & Plan:

## 2022-07-14 ENCOUNTER — Telehealth: Payer: Self-pay

## 2022-07-14 ENCOUNTER — Other Ambulatory Visit: Payer: Self-pay

## 2022-07-14 DIAGNOSIS — R944 Abnormal results of kidney function studies: Secondary | ICD-10-CM

## 2022-07-14 NOTE — Telephone Encounter (Signed)
-----   Message from John Hatch, MD sent at 07/13/2022  4:31 PM EDT ----- Labs look good w/ exception of elevated serum Creatinine (a marker of kidney function) and your decreased GFR (also a marker of kidney function).  Please increase your water intake and we'll repeat your BMP at a lab only visit in 2-3 days (dx decreased GFR)

## 2022-07-14 NOTE — Telephone Encounter (Signed)
Left pt a VM to return my call in regards to his lab results . The repeat BMP order has been placed and he needs a lab only visit with in the next 2 days

## 2022-07-15 ENCOUNTER — Other Ambulatory Visit (INDEPENDENT_AMBULATORY_CARE_PROVIDER_SITE_OTHER): Payer: Medicare HMO

## 2022-07-15 ENCOUNTER — Other Ambulatory Visit: Payer: Self-pay

## 2022-07-15 DIAGNOSIS — R944 Abnormal results of kidney function studies: Secondary | ICD-10-CM | POA: Diagnosis not present

## 2022-07-15 LAB — BASIC METABOLIC PANEL
BUN: 22 mg/dL (ref 6–23)
CO2: 26 mEq/L (ref 19–32)
Calcium: 8.9 mg/dL (ref 8.4–10.5)
Chloride: 103 mEq/L (ref 96–112)
Creatinine, Ser: 1.64 mg/dL — ABNORMAL HIGH (ref 0.40–1.50)
GFR: 40.37 mL/min — ABNORMAL LOW (ref >60.00)
Glucose, Bld: 134 mg/dL — ABNORMAL HIGH (ref 70–99)
Potassium: 4.8 mEq/L (ref 3.5–5.1)
Sodium: 139 mEq/L (ref 135–145)

## 2022-07-15 NOTE — Telephone Encounter (Signed)
This has been completed.

## 2022-07-16 ENCOUNTER — Other Ambulatory Visit: Payer: Self-pay

## 2022-07-16 ENCOUNTER — Telehealth: Payer: Self-pay

## 2022-07-16 DIAGNOSIS — R944 Abnormal results of kidney function studies: Secondary | ICD-10-CM

## 2022-07-16 NOTE — Telephone Encounter (Signed)
Informed pt of lab results and they are agreeable to seeing Nephrologist

## 2022-07-16 NOTE — Telephone Encounter (Signed)
-----   Message from Sheliah Hatch, MD sent at 07/15/2022  5:17 PM EDT ----- Creatinine and GFR are improving.  Please stay on top of your water intake to keep these numbers at your baseline.  I looked through your chart and don't see any record of it, but have you ever seen a kidney specialist (nephrologist)?  It's a reasonable referral to make given your recent kidney issues.  Please let me know if you would like me to proceed

## 2022-07-20 ENCOUNTER — Telehealth: Payer: Self-pay | Admitting: Cardiology

## 2022-07-20 ENCOUNTER — Other Ambulatory Visit: Payer: Self-pay

## 2022-07-20 MED ORDER — APIXABAN 5 MG PO TABS: 5.0000 mg | ORAL_TABLET | Freq: Two times a day (BID) | ORAL | 1 refills | Status: DC

## 2022-07-20 NOTE — Telephone Encounter (Signed)
done

## 2022-07-23 ENCOUNTER — Telehealth: Payer: Self-pay

## 2022-07-23 NOTE — Telephone Encounter (Signed)
[employer] nurse was out to see him today. She would like you to review recent lab work. Also, How much fluid should he be consuming each day? Does he need a earlier appointment to see you? He is also being referred to kidney specialist.

## 2022-07-23 NOTE — Telephone Encounter (Signed)
Recent labs from ? I see him for Afib - didn't place him on fluid restriction.   Corinna Burkman [place], DO, [facility]

## 2022-07-27 ENCOUNTER — Other Ambulatory Visit: Payer: Self-pay | Admitting: Cardiology

## 2022-07-27 DIAGNOSIS — I6523 Occlusion and stenosis of bilateral carotid arteries: Secondary | ICD-10-CM

## 2022-07-27 DIAGNOSIS — I739 Peripheral vascular disease, unspecified: Secondary | ICD-10-CM

## 2022-07-28 NOTE — Telephone Encounter (Signed)
LMTCB

## 2022-07-29 ENCOUNTER — Other Ambulatory Visit: Payer: Self-pay

## 2022-07-29 MED ORDER — HYDRALAZINE HCL 50 MG PO TABS: 50.0000 mg | ORAL_TABLET | Freq: Three times a day (TID) | ORAL | 2 refills | Status: DC

## 2022-08-03 ENCOUNTER — Other Ambulatory Visit: Payer: Self-pay

## 2022-08-03 MED ORDER — HYDRALAZINE HCL 50 MG PO TABS: 50.0000 mg | ORAL_TABLET | Freq: Three times a day (TID) | ORAL | 2 refills | Status: DC

## 2022-08-10 ENCOUNTER — Encounter: Payer: Self-pay | Admitting: Family Medicine

## 2022-08-10 ENCOUNTER — Ambulatory Visit: Payer: Medicare HMO | Admitting: Family Medicine

## 2022-08-10 VITALS — BP 98/42 | HR 61 | Temp 98.7°F | Resp 18 | Ht 71.0 in | Wt 147.0 lb

## 2022-08-10 DIAGNOSIS — I10 Essential (primary) hypertension: Secondary | ICD-10-CM

## 2022-08-10 DIAGNOSIS — E119 Type 2 diabetes mellitus without complications: Secondary | ICD-10-CM | POA: Diagnosis not present

## 2022-08-10 DIAGNOSIS — Z7984 Long term (current) use of oral hypoglycemic drugs: Secondary | ICD-10-CM | POA: Diagnosis not present

## 2022-08-10 LAB — HEMOGLOBIN A1C: Hgb A1c MFr Bld: 6.3 % (ref 4.6–6.5)

## 2022-08-10 LAB — BASIC METABOLIC PANEL
BUN: 20 mg/dL (ref 6–23)
CO2: 29 mEq/L (ref 19–32)
Calcium: 9.6 mg/dL (ref 8.4–10.5)
Chloride: 104 mEq/L (ref 96–112)
Creatinine, Ser: 1.51 mg/dL — ABNORMAL HIGH (ref 0.40–1.50)
GFR: 44.55 mL/min — ABNORMAL LOW (ref >60.00)
Glucose, Bld: 145 mg/dL — ABNORMAL HIGH (ref 70–99)
Potassium: 4.8 mEq/L (ref 3.5–5.1)
Sodium: 139 mEq/L (ref 135–145)

## 2022-08-10 NOTE — Assessment & Plan Note (Signed)
Ongoing issue.  On Metformin 500mg  qAM as he had diarrhea at higher doses.  They would like to stop medication entirely if that's possible.  UTD on eye exam, foot exam, microalbumin.  Check labs.  Adjust meds prn

## 2022-08-10 NOTE — Progress Notes (Signed)
   Subjective:    Patient ID: John Woodward., male    DOB: 01/22/1946, 77 y.o.   MRN: [id number]  HPI HTN- ongoing issue.  Hydrochlorothiazide was stopped at last visit.  Remains on Amlodipine 10mg  daily, Hydralazine 50mg  TID, and Telmisartan 80mg  daily.  BP remains over controlled today at 98/42.  Denies dizziness upon standing.  No CP, SOB, HA's, visual changes.  DM- chronic problem, on Metformin 500mg  qAM.  UTD on eye exam, foot exam, microalbumin.  Due for A1C.  Last A1C 6.5%  no abd pain, N/V.   Review of Systems For ROS see HPI     Objective:   Physical Exam Vitals reviewed.  Constitutional:      General: He is not in acute distress.    Appearance: Normal appearance. He is well-developed. He is obese. He is not ill-appearing.  HENT:     Head: Normocephalic and atraumatic.  Eyes:     Extraocular Movements: Extraocular movements intact.     Conjunctiva/sclera: Conjunctivae normal.     Pupils: Pupils are equal, round, and reactive to light.  Neck:     Thyroid: No thyromegaly.  Cardiovascular:     Rate and Rhythm: Normal rate and regular rhythm.     Pulses: Normal pulses.     Heart sounds: Normal heart sounds. No murmur heard. Pulmonary:     Effort: Pulmonary effort is normal. No respiratory distress.     Breath sounds: Normal breath sounds.  Abdominal:     General: Bowel sounds are normal. There is no distension.     Palpations: Abdomen is soft.  Musculoskeletal:     Cervical back: Normal range of motion and neck supple.     Right lower leg: No edema.     Left lower leg: No edema.  Lymphadenopathy:     Cervical: No cervical adenopathy.  Skin:    General: Skin is warm and dry.  Neurological:     General: No focal deficit present.     Mental Status: He is alert and oriented to person, place, and time.     Cranial Nerves: No cranial nerve deficit.  Psychiatric:        Mood and Affect: Mood normal.        Behavior: Behavior normal.           Assessment &  Plan:

## 2022-08-10 NOTE — Patient Instructions (Signed)
Follow up in 1 month to recheck blood pressure We'll notify you of your lab results and make any changes if needed CONTINUE to HOLD the hydrochlorothiazide DECREASE the Telmisartan (Micardis) to 1/2 tab or 40mg  daily Call with any questions or concerns Stay Safe!  Stay Healthy! Have a great summer!!!

## 2022-08-10 NOTE — Assessment & Plan Note (Signed)
Ongoing issue.  BP remains low today despite stopping hydrochlorothiazide at last visit.  Will decrease Micardis to 40mg  daily (1/2 tab) and monitor BP closely.  He reports he is currently asymptomatic despite low BP but prefer to have readings a bit higher.  Pt and wife expressed understanding and agreement.

## 2022-08-23 ENCOUNTER — Other Ambulatory Visit: Payer: Self-pay | Admitting: Cardiology

## 2022-08-23 DIAGNOSIS — I739 Peripheral vascular disease, unspecified: Secondary | ICD-10-CM

## 2022-08-23 DIAGNOSIS — I6523 Occlusion and stenosis of bilateral carotid arteries: Secondary | ICD-10-CM

## 2022-08-27 DIAGNOSIS — R809 Proteinuria, unspecified: Secondary | ICD-10-CM | POA: Diagnosis not present

## 2022-08-27 DIAGNOSIS — N1832 Chronic kidney disease, stage 3b: Secondary | ICD-10-CM | POA: Diagnosis not present

## 2022-08-27 DIAGNOSIS — I129 Hypertensive chronic kidney disease with stage 1 through stage 4 chronic kidney disease, or unspecified chronic kidney disease: Secondary | ICD-10-CM | POA: Diagnosis not present

## 2022-08-27 DIAGNOSIS — N179 Acute kidney failure, unspecified: Secondary | ICD-10-CM | POA: Diagnosis not present

## 2022-08-27 DIAGNOSIS — E1122 Type 2 diabetes mellitus with diabetic chronic kidney disease: Secondary | ICD-10-CM | POA: Diagnosis not present

## 2022-08-27 DIAGNOSIS — J439 Emphysema, unspecified: Secondary | ICD-10-CM | POA: Diagnosis not present

## 2022-09-09 NOTE — Telephone Encounter (Signed)
No action done

## 2022-09-14 ENCOUNTER — Encounter: Payer: Self-pay | Admitting: Surgery

## 2022-09-14 ENCOUNTER — Ambulatory Visit (HOSPITAL_COMMUNITY)
Admission: RE | Admit: 2022-09-14 | Discharge: 2022-09-14 | Disposition: A | Discharge status: Home / Self Care | Payer: Medicare HMO | Source: Ambulatory Visit | Attending: Surgery | Admitting: Surgery

## 2022-09-14 ENCOUNTER — Ambulatory Visit: Payer: Medicare HMO | Admitting: Surgery

## 2022-09-14 VITALS — BP 134/76 | HR 52 | Temp 98.1°F | Resp 20 | Ht 71.0 in | Wt 149.0 lb

## 2022-09-14 DIAGNOSIS — I6523 Occlusion and stenosis of bilateral carotid arteries: Secondary | ICD-10-CM | POA: Insufficient documentation

## 2022-09-14 DIAGNOSIS — I7143 Infrarenal abdominal aortic aneurysm, without rupture: Secondary | ICD-10-CM

## 2022-09-14 NOTE — Progress Notes (Signed)
[facility] [place]  Patient name: John Woodward. MRN: [id number] DOB: 11-27-1945 Sex: male   REASON FOR VISIT:    Follow up  HISOTRY OF PRESENT ILLNESS:    John Woodward. is a 77 y.o. male, who returns for follow up of his carotid and vascular disease.  His initial ultrasound showed 60 to 79% right-sided stenosis and 40 to 59% left-sided stenosis.  He remains asymptomatic.  Specifically, he denies numbness or weakness in either extremity.  He denies slurred speech.  He denies amaurosis fugax.  He also returns for follow-up of his lower extremity peripheral vascular disease.  His most recent ABIs were 0.67 on the right and 0.48 on the left.   The patient is a diabetic with an A1c in the 6 range.  He is a current smoker.  He is medically managed for hypertension with an ARB.  He takes a statin for hypercholesterolemia.  He is on Eliquis for AFIB   PAST MEDICAL HISTORY:   Past Medical History:  Diagnosis Date   Arthritis    Carotid artery disease (HCC)    Closed fracture dislocation of right elbow    30 years ago -- fell out of theback of a truck   Diabetes mellitus without complication (HCC)    Hx of colonic polyps 02/23/2017   Hyperlipidemia    Hypertension    Diagnosed 10 years ago. No on medication. Denies history of CAD.    Memory loss    PAD (peripheral artery disease) (HCC)      FAMILY HISTORY:   Family History  Problem Relation Age of Onset   Hypertension Mother    Dementia Mother        31s   Transient ischemic attack Mother        75s   Kidney disease Father    COPD Father    Hypertension Brother    Lung cancer Maternal Aunt    Alcohol abuse Maternal Grandfather    Mental illness Paternal Grandmother    Hypertension Daughter    Healthy Son    Learning disabilities Son    Colon cancer Neg Hx    Colon polyps Neg Hx    Esophageal cancer Neg Hx    Rectal cancer Neg Hx    Stomach cancer  Neg Hx     SOCIAL HISTORY:   Social History   Tobacco Use   Smoking status: Former    Current packs/day: 0.00    Types: Cigarettes    Quit date: 1968    Years since quitting: 56.6   Smokeless tobacco: Never   Tobacco comments:    Taking Nicotine lozenges  Substance Use Topics   Alcohol use: No     ALLERGIES:   Allergies  Allergen Reactions   Antibacterial Hand Soap [Triclosan] Other (See Comments)    Caused water blisters     CURRENT MEDICATIONS:   Current Outpatient Medications  Medication Sig Dispense Refill   amLODipine (NORVASC) 10 MG tablet TAKE 1 TABLET BY MOUTH EVERY DAY 90 tablet 1   apixaban (ELIQUIS) 5 MG TABS tablet Take 1 tablet (5 mg total) by mouth 2 (two) times daily. 180 tablet 1   escitalopram (LEXAPRO) 20 MG tablet Take 1 tablet (20 mg total) by mouth daily. 90 tablet 3   hydrALAZINE (APRESOLINE) 50 MG tablet Take 1 tablet (50 mg total) by mouth 3 (three) times daily. 270 tablet 2   ibuprofen (ADVIL) 200 MG tablet Take 400  mg by mouth daily.     nicotine polacrilex (COMMIT) 4 MG lozenge Take 1 lozenge (4 mg total) by mouth as needed for up to 90 doses for smoking cessation. 90 tablet 0   rosuvastatin (CRESTOR) 20 MG tablet TAKE 1 TABLET BY MOUTH EVERYDAY AT BEDTIME 90 tablet 0   telmisartan (MICARDIS) 80 MG tablet Take 1 tablet (80 mg total) by mouth daily. 90 tablet 3   No current facility-administered medications for this visit.    REVIEW OF SYSTEMS:   X denotes positive finding, denotes negative finding Cardiac  Comments:  Chest pain or chest pressure:    Shortness of breath upon exertion:    Short of breath when lying flat:    Irregular heart rhythm:        Vascular    Pain in calf, thigh, or hip brought on by ambulation:    Pain in feet at night that wakes you up from your sleep:     Blood clot in your veins:    Leg swelling:         Pulmonary    Oxygen at home:    Productive cough:     Wheezing:         Neurologic    Sudden  weakness in arms or legs:     Sudden numbness in arms or legs:     Sudden onset of difficulty speaking or slurred speech:    Temporary loss of vision in one eye:     Problems with dizziness:         Gastrointestinal    Blood in stool:     Vomited blood:         Genitourinary    Burning when urinating:     Blood in urine:        Psychiatric    Major depression:         Hematologic    Bleeding problems:    Problems with blood clotting too easily:        Skin    Rashes or ulcers:        Constitutional    Fever or chills:      PHYSICAL EXAM:   Vitals:   09/14/22 1027 09/14/22 1029  BP: 120/68 134/76  Pulse: (!) 52   Resp: 20   Temp: 98.1 F (36.7 C)   SpO2: 96%   Weight: 149 lb (67.6 kg)   Height: 5\' 11"  (1.803 m)     GENERAL: The patient is a well-nourished male, in no acute distress. The vital signs are documented above. CARDIAC: There is a regular rate and rhythm. PULMONARY: Non-labored respirations ABDOMEN: Soft and non-tender  MUSCULOSKELETAL: There are no major deformities or cyanosis. NEUROLOGIC: No focal weakness or paresthesias are detected. SKIN: There are no ulcers or rashes noted. PSYCHIATRIC: The patient has a normal affect.  STUDIES:   I have reviewed his carotid ultrasound with the following findings: Right Carotid: Velocities in the right ICA are consistent with a 40-59%                 stenosis.   Left Carotid: Velocities in the left ICA are consistent with a 60-79%  stenosis.   Vertebrals: Bilateral vertebral arteries demonstrate antegrade flow.  Subclavians: Normal flow hemodynamics were seen in bilateral subclavian               arteries.    MEDICAL ISSUES:   Carotid: He remains asymptomatic with moderate stenosis in the 60 to  79% category on the left side.  No surgical correction is recommended at this time.  He should continue on medical therapy including antiplatelet medication and statin and blood pressure control.  I would  consider revascularization if he remains asymptomatic and his stenosis becomes greater than 80%, or if he develops symptoms.  He will follow-up in 1 year for repeat surveillance imaging  AAA: Incidental finding of a 3.2 cm abdominal aortic aneurysm was detected by CT scan in June 2024.  I will continue with surveillance imaging with repeat ultrasound in 1 year    Durene Cal, IV, MD, FACS [facility] [phone] Pager [phone]

## 2022-10-05 ENCOUNTER — Other Ambulatory Visit: Payer: Self-pay

## 2022-10-05 DIAGNOSIS — I7143 Infrarenal abdominal aortic aneurysm, without rupture: Secondary | ICD-10-CM

## 2022-10-05 DIAGNOSIS — I6523 Occlusion and stenosis of bilateral carotid arteries: Secondary | ICD-10-CM

## 2022-10-07 ENCOUNTER — Telehealth: Payer: Self-pay

## 2022-10-07 NOTE — Telephone Encounter (Signed)
Pt's spouse called requesting to switch from ST to MP or JG. Has pending appt w/ ST in Oct

## 2022-10-22 ENCOUNTER — Other Ambulatory Visit: Payer: Self-pay | Admitting: Cardiology

## 2022-10-22 DIAGNOSIS — I6523 Occlusion and stenosis of bilateral carotid arteries: Secondary | ICD-10-CM

## 2022-10-22 DIAGNOSIS — I739 Peripheral vascular disease, unspecified: Secondary | ICD-10-CM

## 2022-10-29 ENCOUNTER — Other Ambulatory Visit: Payer: Self-pay | Admitting: Family Medicine

## 2022-10-29 ENCOUNTER — Other Ambulatory Visit: Payer: Self-pay | Admitting: Cardiology

## 2022-10-29 DIAGNOSIS — I6523 Occlusion and stenosis of bilateral carotid arteries: Secondary | ICD-10-CM

## 2022-10-29 DIAGNOSIS — I739 Peripheral vascular disease, unspecified: Secondary | ICD-10-CM

## 2022-10-29 DIAGNOSIS — F321 Major depressive disorder, single episode, moderate: Secondary | ICD-10-CM

## 2022-11-10 DIAGNOSIS — I480 Paroxysmal atrial fibrillation: Secondary | ICD-10-CM | POA: Diagnosis not present

## 2022-11-11 ENCOUNTER — Encounter (HOSPITAL_BASED_OUTPATIENT_CLINIC_OR_DEPARTMENT_OTHER): Payer: Self-pay | Admitting: Emergency Medicine

## 2022-11-11 ENCOUNTER — Telehealth: Payer: Self-pay

## 2022-11-11 ENCOUNTER — Emergency Department (HOSPITAL_BASED_OUTPATIENT_CLINIC_OR_DEPARTMENT_OTHER)
Admission: EM | Admit: 2022-11-11 | Discharge: 2022-11-11 | Disposition: A | Discharge status: Home / Self Care | Payer: Medicare HMO

## 2022-11-11 ENCOUNTER — Other Ambulatory Visit: Payer: Self-pay

## 2022-11-11 ENCOUNTER — Other Ambulatory Visit (HOSPITAL_BASED_OUTPATIENT_CLINIC_OR_DEPARTMENT_OTHER): Payer: Self-pay

## 2022-11-11 DIAGNOSIS — E119 Type 2 diabetes mellitus without complications: Secondary | ICD-10-CM | POA: Insufficient documentation

## 2022-11-11 DIAGNOSIS — E875 Hyperkalemia: Secondary | ICD-10-CM | POA: Insufficient documentation

## 2022-11-11 DIAGNOSIS — Z7901 Long term (current) use of anticoagulants: Secondary | ICD-10-CM | POA: Diagnosis not present

## 2022-11-11 DIAGNOSIS — I10 Essential (primary) hypertension: Secondary | ICD-10-CM | POA: Diagnosis not present

## 2022-11-11 DIAGNOSIS — R001 Bradycardia, unspecified: Secondary | ICD-10-CM | POA: Diagnosis not present

## 2022-11-11 DIAGNOSIS — Z8673 Personal history of transient ischemic attack (TIA), and cerebral infarction without residual deficits: Secondary | ICD-10-CM | POA: Diagnosis not present

## 2022-11-11 DIAGNOSIS — Z79899 Other long term (current) drug therapy: Secondary | ICD-10-CM | POA: Diagnosis not present

## 2022-11-11 LAB — BASIC METABOLIC PANEL
Anion gap: 11 (ref 5–15)
BUN/Creatinine Ratio: 11 (ref 10–24)
BUN: 16 mg/dL (ref 8–27)
BUN: 14 mg/dL (ref 8–23)
CO2: 23 mmol/L (ref 20–29)
CO2: 27 mmol/L (ref 22–32)
Calcium: 9.6 mg/dL (ref 8.6–10.2)
Calcium: 9.6 mg/dL (ref 8.9–10.3)
Chloride: 104 mmol/L (ref 96–106)
Chloride: 103 mmol/L (ref 98–111)
Creatinine, Ser: 1.52 mg/dL — ABNORMAL HIGH (ref 0.76–1.27)
Creatinine, Ser: 1.37 mg/dL — ABNORMAL HIGH (ref 0.61–1.24)
GFR, Estimated: 53 mL/min — ABNORMAL LOW (ref >60)
Glucose, Bld: 131 mg/dL — ABNORMAL HIGH (ref 70–99)
Glucose: 146 mg/dL — ABNORMAL HIGH (ref 70–99)
Potassium: 6.2 mmol/L — ABNORMAL HIGH (ref 3.5–5.2)
Potassium: 3.4 mmol/L — ABNORMAL LOW (ref 3.5–5.1)
Sodium: 142 mmol/L (ref 134–144)
Sodium: 141 mmol/L (ref 135–145)
eGFR: 47 mL/min/{1.73_m2} — ABNORMAL LOW (ref >59)

## 2022-11-11 LAB — BASIC METABOLIC PANEL WITH GFR
Anion gap: 8 (ref 5–15)
BUN: 15 mg/dL (ref 8–23)
CO2: 29 mmol/L (ref 22–32)
Calcium: 9.5 mg/dL (ref 8.9–10.3)
Chloride: 106 mmol/L (ref 98–111)
Creatinine, Ser: 1.52 mg/dL — ABNORMAL HIGH (ref 0.61–1.24)
GFR, Estimated: 47 mL/min — ABNORMAL LOW
Glucose, Bld: 126 mg/dL — ABNORMAL HIGH (ref 70–99)
Potassium: 5.5 mmol/L — ABNORMAL HIGH (ref 3.5–5.1)
Sodium: 143 mmol/L (ref 135–145)

## 2022-11-11 LAB — CBC
HCT: 42.1 % (ref 39.0–52.0)
Hemoglobin: 14.2 g/dL (ref 13.0–17.0)
MCH: 32 pg (ref 26.0–34.0)
MCHC: 33.7 g/dL (ref 30.0–36.0)
MCV: 94.8 fL (ref 80.0–100.0)
Platelets: 338 K/uL (ref 150–400)
RBC: 4.44 MIL/uL (ref 4.22–5.81)
RDW: 11.9 % (ref 11.5–15.5)
WBC: 5.3 K/uL (ref 4.0–10.5)
nRBC: 0 % (ref 0.0–0.2)

## 2022-11-11 LAB — HEMOGLOBIN AND HEMATOCRIT, BLOOD
Hematocrit: 43.4 % (ref 37.5–51.0)
Hemoglobin: 14.3 g/dL (ref 13.0–17.7)

## 2022-11-11 LAB — MAGNESIUM: Magnesium: 2.2 mg/dL (ref 1.7–2.4)

## 2022-11-11 MED ORDER — ALBUTEROL SULFATE (2.5 MG/3ML) 0.083% IN NEBU
10.0000 mg | INHALATION_SOLUTION | Freq: Once | RESPIRATORY_TRACT | Status: AC
  Administered 2022-11-11: 10 mg via RESPIRATORY_TRACT
  Filled 2022-11-11: qty 12

## 2022-11-11 MED ORDER — CALCIUM GLUCONATE-NACL 1-0.675 GM/50ML-% IV SOLN
1.0000 g | Freq: Once | INTRAVENOUS | Status: AC
  Administered 2022-11-11: 1000 mg via INTRAVENOUS
  Filled 2022-11-11: qty 50

## 2022-11-11 MED ORDER — SODIUM CHLORIDE 0.9 % IV BOLUS
500.0000 mL | Freq: Once | INTRAVENOUS | Status: AC
  Administered 2022-11-11: 500 mL via INTRAVENOUS

## 2022-11-11 MED ORDER — FUROSEMIDE 10 MG/ML IJ SOLN
40.0000 mg | Freq: Once | INTRAMUSCULAR | Status: AC
  Administered 2022-11-11: 40 mg via INTRAVENOUS
  Filled 2022-11-11: qty 4

## 2022-11-11 NOTE — Telephone Encounter (Signed)
Patient and spouse agree to go to ED.

## 2022-11-11 NOTE — ED Notes (Signed)
RT at bedside.

## 2022-11-11 NOTE — Telephone Encounter (Signed)
-----   Message from [facility] sent at 11/11/2022 11:18 AM EDT ----- Incidental finding of hyperkalemia. Hold telmisartan for now. Since the potassium levels are elevated recommend ED evaluation for treatment of hyperkalemia. No follow-up with APP in 2 weeks and will have a BMP prior to that. Also hold off on consuming bananas.  Sunit [place], DO, [facility]

## 2022-11-11 NOTE — ED Notes (Signed)
Urinal given to patient post lasix administration

## 2022-11-11 NOTE — Discharge Instructions (Signed)
As we discussed, your potassium was mildly elevated today. We have corrected this with medications and it is now 0.1 below normal. This should normalize in the next few hours. I want you to see your doctor to have this lab redrawn in the next few days to ensure it has normalized. Please call to schedule this. In the interim, please discontinue the daily bananas and the Telmisartan per your doctors recommendations.  Return if development of any new or worsening symptoms

## 2022-11-11 NOTE — ED Provider Notes (Signed)
[facility] AT [facility] Provider Note   CSN: [id number] Arrival date & time: 11/11/22  1228     History  No chief complaint on file.   Hilman Catapano. is a 77 y.o. male.  Patient with history of hypertension, hyperlipidemia, afib on Eliquis, CVA presents today with complaints of hyperkalemia. He states that he had routine blood work drawn yesterday and was found to have a potassium of 6.2. He was called and told to go to the ER for management of same. Patient feels well and has no complaints.  Specifically, denies chest pain or shortness of breath. No leg swelling. Does note that he is on an ARB for hypertension and eats bananas daily. He has been instructed by his doctor to discontinue this medication and stop eating bananas.   The history is provided by the patient. No language interpreter was used.       Home Medications Prior to Admission medications   Medication Sig Start Date End Date Taking? Authorizing Provider  amLODipine (NORVASC) 10 MG tablet TAKE 1 TABLET BY MOUTH EVERY DAY 10/29/22   Tolia, Sunit, DO  apixaban (ELIQUIS) 5 MG TABS tablet Take 1 tablet (5 mg total) by mouth 2 (two) times daily. 07/20/22   Tolia, Sunit, DO  escitalopram (LEXAPRO) 20 MG tablet TAKE 1 TABLET BY MOUTH EVERY DAY 10/29/22   Sheliah Hatch, MD  hydrALAZINE (APRESOLINE) 50 MG tablet Take 1 tablet (50 mg total) by mouth 3 (three) times daily. 08/03/22   Tolia, Sunit, DO  ibuprofen (ADVIL) 200 MG tablet Take 400 mg by mouth daily.    [provider]  nicotine polacrilex (COMMIT) 4 MG lozenge Take 1 lozenge (4 mg total) by mouth as needed for up to 90 doses for smoking cessation. 02/13/22   Tolia, Sunit, DO  rosuvastatin (CRESTOR) 20 MG tablet TAKE 1 TABLET BY MOUTH EVERYDAY AT BEDTIME 10/22/22   Tolia, Sunit, DO  telmisartan (MICARDIS) 80 MG tablet Take 1 tablet (80 mg total) by mouth daily. 07/04/22   Ghimire, Werner Lean, MD  metoprolol tartrate (LOPRESSOR)  25 MG tablet Take 1 tablet (25 mg total) by mouth 2 (two) times daily. 02/01/20 02/16/20  Cantwell, Celeste C, PA-C      Allergies    Antibacterial hand soap [triclosan]    Review of Systems   Review of Systems  All other systems reviewed and are negative.   Physical Exam Updated Vital Signs BP 131/71   Pulse (!) 50   Temp 97.7 F (36.5 C) (Oral)   Resp 20   SpO2 95%  Physical Exam Vitals and nursing note reviewed.  Constitutional:      General: He is not in acute distress.    Appearance: Normal appearance. He is normal weight. He is not ill-appearing, toxic-appearing or diaphoretic.  HENT:     Head: Normocephalic and atraumatic.  Cardiovascular:     Rate and Rhythm: Normal rate and regular rhythm.     Heart sounds: Normal heart sounds.  Pulmonary:     Effort: Pulmonary effort is normal. No respiratory distress.     Breath sounds: Normal breath sounds.  Abdominal:     General: Abdomen is flat.     Palpations: Abdomen is soft.     Tenderness: There is no abdominal tenderness.  Musculoskeletal:        General: Normal range of motion.     Cervical back: Normal range of motion.     Right lower leg: No  edema.     Left lower leg: No edema.  Skin:    General: Skin is warm and dry.  Neurological:     General: No focal deficit present.     Mental Status: He is alert.  Psychiatric:        Mood and Affect: Mood normal.        Behavior: Behavior normal.     ED Results / Procedures / Treatments   Labs (all labs ordered are listed, but only abnormal results are displayed) Labs Reviewed  BASIC METABOLIC PANEL - Abnormal; Notable for the following components:      Result Value   Potassium 5.5 (*)    Glucose, Bld 126 (*)    Creatinine, Ser 1.52 (*)    GFR, Estimated 47 (*)    All other components within normal limits  BASIC METABOLIC PANEL - Abnormal; Notable for the following components:   Potassium 3.4 (*)    Glucose, Bld 131 (*)    Creatinine, Ser 1.37 (*)    GFR,  Estimated 53 (*)    All other components within normal limits  CBC  MAGNESIUM    EKG EKG Interpretation Date/Time:  Wednesday November 11 2022 12:49:35 EDT Ventricular Rate:  59 PR Interval:  148 QRS Duration:  84 QT Interval:  414 QTC Calculation: 409 R Axis:   59  Text Interpretation: Sinus bradycardia Septal infarct , age undetermined Abnormal ECG When compared with ECG of 29-Jun-2022 12:26, PREVIOUS ECG IS PRESENT Confirmed by Estanislado Pandy [phone]) on 11/11/2022 1:26:39 PM  Radiology No results found.  Procedures Procedures    Medications Ordered in ED Medications  calcium gluconate 1 g/ 50 mL sodium chloride IVPB (0 mg Intravenous Stopped 11/11/22 1449)  furosemide (LASIX) injection 40 mg (40 mg Intravenous Given 11/11/22 1350)  sodium chloride 0.9 % bolus 500 mL (0 mLs Intravenous Stopped 11/11/22 1449)  albuterol (PROVENTIL) (2.5 MG/3ML) 0.083% nebulizer solution 10 mg (10 mg Nebulization Given 11/11/22 1358)    ED Course/ Medical Decision Making/ A&P                                 Medical Decision Making Amount and/or Complexity of Data Reviewed Labs: ordered.  Risk Prescription drug management.   This patient is a 77 y.o. male who presents to the ED for concern of hyperkalemia, this involves an extensive number of treatment options, and is a complaint that carries with it a high risk of complications and morbidity. The emergent differential diagnosis prior to evaluation includes, but is not limited to,  kidney failure, medication overuse, arrhythmia . This is not an exhaustive differential.   Past Medical History / Co-morbidities / Social History:  has a past medical history of Arthritis, Carotid artery disease (HCC), Closed fracture dislocation of right elbow, Diabetes mellitus without complication (HCC), colonic polyps (02/23/2017), Hyperlipidemia, Hypertension, Memory loss, and PAD (peripheral artery disease) (HCC).  Additional history: Chart reviewed.  Pertinent results include: K 6.2 with pcp, telephone called from pcp's office recommending that he go to the ER for management and discontinue Telmisartan and banana intake.  Physical Exam: Physical exam performed. The pertinent findings include: overall well appearing, no physical exam abnormalities  Lab Tests: I ordered, and personally interpreted labs.  The pertinent results include:  K 5.5 --> 3.4. Creatinine 1.50, consistent with previous. Mag WNL. No other acute laboratory abnormalities   Cardiac Monitoring:  The patient was maintained on  a cardiac monitor.  My attending physician Dr. Maple Hudson viewed and interpreted the cardiac monitored which showed an underlying rhythm of: sinus rhythm, no peaked T waves. I agree with this interpretation.   Medications: I ordered medication including Lasix, fluids, albuterol, calcium gluconate  for hyperkalemia. Reevaluation of the patient after these medicines showed that the patient improved. I have reviewed the patients home medicines and have made adjustments as needed.   Disposition: After consideration of the diagnostic results and the patients response to treatment, I feel that emergency department workup does not suggest an emergent condition requiring admission or immediate intervention beyond what has been performed at this time. The plan is: discharge with close outpatient follow-up and return precautions. Patients potassium improved after interventions, he is asymptomatic and has good outpatient follow-up. Recommend potassium recheck in the next few days and pcp follow-up. Evaluation and diagnostic testing in the emergency department does not suggest an emergent condition requiring admission or immediate intervention beyond what has been performed at this time.  Plan for discharge with close PCP follow-up.  Patient is understanding and amenable with plan, educated on red flag symptoms that would prompt immediate return.  Patient discharged in stable  condition.   I discussed this case with my attending physician Dr. Maple Hudson who cosigned this note including patient's presenting symptoms, physical exam, and planned diagnostics and interventions. Attending physician stated agreement with plan or made changes to plan which were implemented.    Final Clinical Impression(s) / ED Diagnoses Final diagnoses:  Hyperkalemia    Rx / DC Orders ED Discharge Orders     None     An After Visit Summary was printed and given to the patient.     Silva Bandy, PA-C 11/11/22 1547    Coral Spikes, DO 11/20/22 1510

## 2022-11-11 NOTE — ED Notes (Signed)
Reviewed AVS/discharge instruction with patient. Time allotted for and all questions answered. Patient is agreeable for d/c and escorted to ed exit by staff.  

## 2022-11-11 NOTE — ED Triage Notes (Signed)
Was called by MD told potassium 6.2.

## 2022-11-11 NOTE — Telephone Encounter (Signed)
I am okay w/ the provider switch.  But please send him to ED as the potassium is >6.   John Vitali Trinity, DO, [facility]

## 2022-11-11 NOTE — Telephone Encounter (Signed)
Agree  Thanks MJP

## 2022-11-11 NOTE — Telephone Encounter (Signed)
Call to patient and spouse [place] to advise K+ is elevated. [place] reports patient has been eating half a banana daily, agrees to stop daily banana consumption, also agree to hold Telmisartan. [place] reports that patient has not had cramping or nause, but has been fatigued and sleeping a lot for the past month and having daily loose stools. Patient already has an appt with Dr. Rosemary Holms as he wanted to change providers. Patient still wishes to change providers so 11/20/22 appt  with Dr. Rosemary Holms left as is. BMET scheduled for 11/18/22.

## 2022-11-18 ENCOUNTER — Other Ambulatory Visit: Payer: Self-pay

## 2022-11-18 ENCOUNTER — Ambulatory Visit: Payer: Medicare HMO | Attending: Cardiology

## 2022-11-18 DIAGNOSIS — I6523 Occlusion and stenosis of bilateral carotid arteries: Secondary | ICD-10-CM

## 2022-11-18 DIAGNOSIS — I10 Essential (primary) hypertension: Secondary | ICD-10-CM

## 2022-11-18 DIAGNOSIS — E785 Hyperlipidemia, unspecified: Secondary | ICD-10-CM

## 2022-11-18 NOTE — Progress Notes (Signed)
John Lerner, DO  to [name], RN   ST   11/11/22 11:55 AM Note I am okay w/ the provider switch.  But please send him to ED as the potassium is >6.    Sunit Tolia, DO, FACC       Pt came to [facility] for BMET redraw, but order needed to be put in. Done at this time.

## 2022-11-19 ENCOUNTER — Ambulatory Visit: Payer: Medicare HMO | Admitting: Cardiology

## 2022-11-19 LAB — BASIC METABOLIC PANEL
BUN/Creatinine Ratio: 14 (ref 10–24)
BUN: 19 mg/dL (ref 8–27)
CO2: 26 mmol/L (ref 20–29)
Calcium: 9.3 mg/dL (ref 8.6–10.2)
Chloride: 105 mmol/L (ref 96–106)
Creatinine, Ser: 1.39 mg/dL — ABNORMAL HIGH (ref 0.76–1.27)
Glucose: 130 mg/dL — ABNORMAL HIGH (ref 70–99)
Potassium: 4.9 mmol/L (ref 3.5–5.2)
Sodium: 143 mmol/L (ref 134–144)
eGFR: 53 mL/min/{1.73_m2} — ABNORMAL LOW (ref >59)

## 2022-11-20 ENCOUNTER — Ambulatory Visit: Payer: Medicare HMO | Attending: Cardiology | Admitting: Cardiology

## 2022-11-20 ENCOUNTER — Encounter: Payer: Self-pay | Admitting: Cardiology

## 2022-11-20 VITALS — BP 122/80 | HR 64 | Resp 16 | Ht 71.0 in | Wt 152.0 lb

## 2022-11-20 DIAGNOSIS — I480 Paroxysmal atrial fibrillation: Secondary | ICD-10-CM | POA: Diagnosis not present

## 2022-11-20 DIAGNOSIS — I739 Peripheral vascular disease, unspecified: Secondary | ICD-10-CM | POA: Diagnosis not present

## 2022-11-20 DIAGNOSIS — I6523 Occlusion and stenosis of bilateral carotid arteries: Secondary | ICD-10-CM | POA: Diagnosis not present

## 2022-11-20 MED ORDER — ROSUVASTATIN CALCIUM 40 MG PO TABS: 40.0000 mg | ORAL_TABLET | Freq: Every day | ORAL | 2 refills | Status: DC

## 2022-11-20 MED ORDER — ASPIRIN 81 MG PO TBEC: 81.0000 mg | DELAYED_RELEASE_TABLET | Freq: Every day | ORAL | Status: DC

## 2022-11-20 NOTE — Progress Notes (Signed)
Cardiology Office Note:  .   Date:  11/20/2022  ID:  John Church., DOB 16-Oct-1945, MRN [id number] PCP: Sheliah Hatch, MD  [facility] Providers Cardiologist:  Truett Mainland, MD PCP: Sheliah Hatch, MD  Chief Complaint  Patient presents with   Bilateral carotid artery stenosis   Follow-up      History of Present Illness: .    John Aument. is a 77 y.o. male with hypertension, hyperlipidemia, type 2 diabetes mellitus, paroxysmal A-fib, PAD w/claudication, asymptomatic carotid artery disease, AAA 3.2 cm, COPD  Patient was recently seen in [facility] emergency room on 11/11/2022 with incidental finding of K 6.2.  No T wave abnormalities were noted on EKG.  Telmisartan was discontinued, patient was recommended to reduce banana intake.  Patient is here today with his wife.  Overall, his wife notices that he has become more sedentary and lethargic over period of time.  Patient himself denies noticing anything different with his day-to-day life.  He denies any chest pain, shortness of breath, but admits he lives fairly sedentary lifestyle.  He does report bilateral calf claudication left greater than sign right, but does not have any open wounds, ulcers on his feet, also denies any resting leg pain.  He has regular follow-up for his PAD with Dr. Myra Gianotti.  Patient quit smoking in June 2024 after having another episode of COVID.  He notices that his A-fib recurs during his COVID illness.  Reviewed lipid panel results from 04/2020, details below.  Vitals:   11/20/22 1015  BP: 122/80  Pulse: 64  Resp: 16  SpO2: 96%     ROS:  Review of Systems  Constitutional: Positive for malaise/fatigue.  Cardiovascular:  Positive for claudication. Negative for chest pain, dyspnea on exertion, leg swelling, palpitations and syncope.     Studies Reviewed: Marland Kitchen       Labs 11/18/2022: Creatinine 1.39.  Potassium 4.9.  11/10/2022: Creatinine 1.52, Potassium  6.2  04/2022: Chol 175, TG 157, HDL 43, LDL 94  CTA chest 02/1306: 1. Negative examination for pulmonary embolism. 2. Mild heterogeneous and consolidative airspace opacity of the right-greater-than-left lung bases, consistent with infection or aspiration. 3. Emphysema and diffuse bilateral bronchial wall thickening. 4. Coronary artery disease.   Aortic Atherosclerosis (ICD10-I70.0) and Emphysema (ICD10-J43.9).  Echocardiogram 2021: 1. ? patient in afib during exam.   2. Left ventricular ejection fraction, by estimation, is 60 to 65%. The  left ventricle has normal function. The left ventricle has no regional  wall motion abnormalities. Left ventricular diastolic parameters are  indeterminate.   3. Right ventricular systolic function is normal. The right ventricular  size is normal.   4. Left atrial size was mildly dilated.   5. The mitral valve is degenerative. Trivial mitral valve regurgitation.  No evidence of mitral stenosis.   6. The aortic valve was not well visualized. Aortic valve regurgitation  is not visualized. Mild to moderate aortic valve sclerosis/calcification  is present, without any evidence of aortic stenosis.   7. The inferior vena cava is normal in size with greater than 50%  respiratory variability, suggesting right atrial pressure of 3 mmHg.    Risk Assessment/Calculations:    CHA2DS2-VASc Score = 5  This indicates a 7.2% annual risk of stroke. The patient's score is based upon: CHF History: 0 HTN History: 1 Diabetes History: 1 Stroke History: 0 Vascular Disease History: 1 Age Score: 2 Gender Score: 0   Physical Exam:   Physical Exam Vitals  and nursing note reviewed.  Constitutional:      General: He is not in acute distress. Neck:     Vascular: No JVD.  Cardiovascular:     Rate and Rhythm: Normal rate and regular rhythm.     Pulses:          Dorsalis pedis pulses are 0 on the right side and 0 on the left side.       Posterior tibial pulses  are 0 on the right side.     Heart sounds: Normal heart sounds. No murmur heard. Pulmonary:     Effort: Pulmonary effort is normal.     Breath sounds: Normal breath sounds. No wheezing or rales.  Musculoskeletal:     Right lower leg: No edema.     Left lower leg: No edema.      VISIT DIAGNOSES:   ICD-10-CM   1. PAD (peripheral artery disease) (HCC)  I73.9     2. Paroxysmal atrial fibrillation (HCC)  I48.0     3. Bilateral carotid artery stenosis  I65.23        ASSESSMENT AND PLAN: .    John Woodward. is a 77 y.o. male with hypertension, hyperlipidemia, type 2 diabetes mellitus, paroxysmal A-fib, PAD w/claudication, asymptomatic carotid artery disease, AAA 3.2 cm, COPD  PAF: Maintaining sinus rhythm. Continue Eliquis 5 mg twice daily.  PAD, AAA: Asymptomatic carotid artery disease, and symptomatic lower extremity PAD with claudication. No resting leg ischemia or critical limb ischemia. Small incidentally found AAA 3.2 cm Continue medical management. Emphasized importance of increasing physical activity with regular walking. In addition to Eliquis for A-fib, agree with aspirin 81 mg daily for PAD. Caution patient against use of any NSAIDs. Increase Crestor from 20 mg daily to 40 mg daily.  He will have repeat lipid panel with Dr. Beverely Low next few months. Blood pressure is well-controlled.  He was previously on telmisartan, but was stopped due to hyperkalemia with K of 6.2. Congratulated patient on success quitting smoking.  Generalized fatigue: While patient does not seem to have notices, wife notices generalized decreased functional capacity, sleeping more, lethargy, occasional memory issues. I have encouraged the patient to discuss this further with PCP Dr. Beverely Low during upcoming visit and screen for dementia and depression.     Meds ordered this encounter  Medications   aspirin EC 81 MG tablet    Sig: Take 1 tablet (81 mg total) by mouth daily. Swallow whole.    rosuvastatin (CRESTOR) 40 MG tablet    Sig: Take 1 tablet (40 mg total) by mouth daily.    Dispense:  90 tablet    Refill:  2    DOSE INCREASE     F/u in 6 months  Signed, Elder Negus, MD

## 2022-11-20 NOTE — Patient Instructions (Signed)
Medication Instructions:   INCREASE YOUR ROSUVASTATIN (CRESTOR) TO 40 MG BY MOUTH DAILY  *If you need a refill on your cardiac medications before your next appointment, please call your pharmacy*     Follow-Up: At [facility], you and your health needs are our priority.  As part of our continuing mission to provide you with exceptional heart care, we have created designated Provider Care Teams.  These Care Teams include your primary Cardiologist (physician) and Advanced Practice Providers (APPs -  Physician Assistants and Nurse Practitioners) who all work together to provide you with the care you need, when you need it.  We recommend signing up for the patient portal called "MyChart".  Sign up information is provided on this After Visit Summary.  MyChart is used to connect with patients for Virtual Visits (Telemedicine).  Patients are able to view lab/test results, encounter notes, upcoming appointments, etc.  Non-urgent messages can be sent to your provider as well.   To learn more about what you can do with MyChart, go to [email or web address].    Your next appointment:   6 month(s)  Provider:   DR. Rosemary Holms

## 2022-11-24 ENCOUNTER — Encounter: Payer: Self-pay | Admitting: Family Medicine

## 2022-11-24 ENCOUNTER — Ambulatory Visit: Payer: Medicare HMO | Admitting: Family Medicine

## 2022-11-24 VITALS — BP 102/58 | HR 78 | Temp 97.8°F | Ht 71.0 in | Wt 151.0 lb

## 2022-11-24 DIAGNOSIS — I10 Essential (primary) hypertension: Secondary | ICD-10-CM | POA: Diagnosis not present

## 2022-11-24 DIAGNOSIS — E785 Hyperlipidemia, unspecified: Secondary | ICD-10-CM | POA: Diagnosis not present

## 2022-11-24 DIAGNOSIS — E119 Type 2 diabetes mellitus without complications: Secondary | ICD-10-CM | POA: Diagnosis not present

## 2022-11-24 DIAGNOSIS — E875 Hyperkalemia: Secondary | ICD-10-CM | POA: Diagnosis not present

## 2022-11-24 DIAGNOSIS — E1169 Type 2 diabetes mellitus with other specified complication: Secondary | ICD-10-CM | POA: Diagnosis not present

## 2022-11-24 LAB — HEPATIC FUNCTION PANEL
ALT: 38 U/L (ref 0–53)
AST: 29 U/L (ref 0–37)
Albumin: 4.2 g/dL (ref 3.5–5.2)
Alkaline Phosphatase: 71 U/L (ref 39–117)
Bilirubin, Direct: 0.1 mg/dL (ref 0.0–0.3)
Total Bilirubin: 0.3 mg/dL (ref 0.2–1.2)
Total Protein: 7.1 g/dL (ref 6.0–8.3)

## 2022-11-24 LAB — CBC WITH DIFFERENTIAL/PLATELET
Basophils Absolute: 0.1 10*3/uL (ref 0.0–0.1)
Basophils Relative: 1.1 % (ref 0.0–3.0)
Eosinophils Absolute: 0.3 10*3/uL (ref 0.0–0.7)
Eosinophils Relative: 7 % — ABNORMAL HIGH (ref 0.0–5.0)
HCT: 43.9 % (ref 39.0–52.0)
Hemoglobin: 14.1 g/dL (ref 13.0–17.0)
Lymphocytes Relative: 24.2 % (ref 12.0–46.0)
Lymphs Abs: 1.1 10*3/uL (ref 0.7–4.0)
MCHC: 32.2 g/dL (ref 30.0–36.0)
MCV: 96.6 fL (ref 78.0–100.0)
Monocytes Absolute: 0.3 10*3/uL (ref 0.1–1.0)
Monocytes Relative: 6.8 % (ref 3.0–12.0)
Neutro Abs: 2.9 10*3/uL (ref 1.4–7.7)
Neutrophils Relative %: 60.9 % (ref 43.0–77.0)
Platelets: 274 10*3/uL (ref 150.0–400.0)
RBC: 4.55 Mil/uL (ref 4.22–5.81)
RDW: 12.9 % (ref 11.5–15.5)
WBC: 4.7 10*3/uL (ref 4.0–10.5)

## 2022-11-24 LAB — LIPID PANEL
Cholesterol: 140 mg/dL (ref 0–200)
HDL: 51.1 mg/dL (ref >39.00)
LDL Cholesterol: 71 mg/dL (ref 0–99)
NonHDL: 88.6
Total CHOL/HDL Ratio: 3
Triglycerides: 88 mg/dL (ref 0.0–149.0)
VLDL: 17.6 mg/dL (ref 0.0–40.0)

## 2022-11-24 LAB — BASIC METABOLIC PANEL
BUN: 19 mg/dL (ref 6–23)
CO2: 29 meq/L (ref 19–32)
Calcium: 9.2 mg/dL (ref 8.4–10.5)
Chloride: 104 meq/L (ref 96–112)
Creatinine, Ser: 1.43 mg/dL (ref 0.40–1.50)
GFR: 47.46 mL/min — ABNORMAL LOW (ref >60.00)
Glucose, Bld: 140 mg/dL — ABNORMAL HIGH (ref 70–99)
Potassium: 4.5 meq/L (ref 3.5–5.1)
Sodium: 140 meq/L (ref 135–145)

## 2022-11-24 LAB — TSH: TSH: 1.91 u[IU]/mL (ref 0.35–5.50)

## 2022-11-24 LAB — HEMOGLOBIN A1C: Hgb A1c MFr Bld: 6.7 % — ABNORMAL HIGH (ref 4.6–6.5)

## 2022-11-24 NOTE — Patient Instructions (Signed)
Schedule your complete physical in 6 months We'll notify you of your lab results and make any changes if needed No med changes at this time Try and stay as active as possible and engage your brain daily- word puzzles, number puzzles/games, crosswords, etc Call with any questions or concerns Stay Safe!  Stay Healthy!

## 2022-11-24 NOTE — Assessment & Plan Note (Signed)
Ongoing issue for pt.  UTD on microalbumin, foot exam, eye exam.  Due for repeat A1C.

## 2022-11-24 NOTE — Assessment & Plan Note (Signed)
Chronic problem.  BP is low today.  Pt states he drinks adequate water but wife disagrees.  Encouraged him to stay hydrated to support BP and improve kidney function (which will in turn improve K+).  Currently asymptomatic.  Just saw Cardiology last week and BP was normal.  Will hold on med changes at this time

## 2022-11-24 NOTE — Progress Notes (Signed)
Subjective:    Patient ID: John Woodward., male    DOB: January 27, 1945, 77 y.o.   MRN: [id number]  HPI ER f/u- pt had labs done through Cardiology and K+ was 6.2 on 10/15.  Went to ER on 10/16 and K+ was 5.5 on arrival and down to 3.4 later that afternoon after receiving Lasix, fluids, albuterol, and calcium gluconate.  His Telmisartan was stopped and he was instructed to stop eating daily bananas.  Subsequent labs on 10/23 showed potassium of 4.9 (had near syncopal episode).  Pt reports feeling well.  'i'm as active as I can be'.  Takes daily afternoon nap.  Wife reports he is sleeping ~12 hrs/day.    BP is low today.  Pt reports he drinks 'a couple of bottles' of water daily.  Wife disagrees.  Says she offers water but he rarely drinks it.  No CP, SOB, HA's, edema.    Hyperlipidemia- Cardiology increased Crestor from 20 --> 40mg  daily.  Due for repeat lipid panel.  No abd pain, N/V.   Review of Systems For ROS see HPI     Objective:   Physical Exam Vitals reviewed.  Constitutional:      General: He is not in acute distress.    Appearance: Normal appearance. He is well-developed. He is not ill-appearing.  HENT:     Head: Normocephalic and atraumatic.  Eyes:     Extraocular Movements: Extraocular movements intact.     Conjunctiva/sclera: Conjunctivae normal.     Pupils: Pupils are equal, round, and reactive to light.  Neck:     Thyroid: No thyromegaly.  Cardiovascular:     Rate and Rhythm: Normal rate and regular rhythm.     Pulses: Normal pulses.  Pulmonary:     Effort: Pulmonary effort is normal. No respiratory distress.     Breath sounds: Normal breath sounds.  Abdominal:     General: Bowel sounds are normal. There is no distension.     Palpations: Abdomen is soft.  Musculoskeletal:     Cervical back: Normal range of motion and neck supple.     Right lower leg: No edema.     Left lower leg: No edema.  Lymphadenopathy:     Cervical: No cervical adenopathy.  Skin:     General: Skin is warm and dry.  Neurological:     General: No focal deficit present.     Mental Status: He is alert and oriented to person, place, and time.     Cranial Nerves: No cranial nerve deficit.  Psychiatric:        Mood and Affect: Mood normal.        Behavior: Behavior normal.           Assessment & Plan:  Hyperkalemia- new.  Occurred earlier this month and pt had to go to ER for management.  K+ improved prior to d/c and subsequent labs showed normal level.  Will repeat today to ensure this remains normal/stable.

## 2022-11-24 NOTE — Assessment & Plan Note (Signed)
Chronic problem.  Cardiology recently increased Crestor from 20mg  --> 40mg  daily.  Due for repeat labs today.  Will continue to follow.

## 2022-11-25 ENCOUNTER — Telehealth: Payer: Self-pay

## 2022-11-25 ENCOUNTER — Other Ambulatory Visit: Payer: Medicare HMO

## 2022-11-25 NOTE — Telephone Encounter (Signed)
-----   Message from Neena Rhymes sent at 11/24/2022  8:32 PM EDT ----- Labs are stable.  No med changes at this time.  Increase your water intake and watch your carb intake (sugars and starches like bread/pasta/potatoes/rice)

## 2022-11-30 ENCOUNTER — Ambulatory Visit: Payer: Medicare HMO | Admitting: Physician Assistant

## 2023-01-06 DIAGNOSIS — R809 Proteinuria, unspecified: Secondary | ICD-10-CM | POA: Diagnosis not present

## 2023-01-06 DIAGNOSIS — N1832 Chronic kidney disease, stage 3b: Secondary | ICD-10-CM | POA: Diagnosis not present

## 2023-01-06 DIAGNOSIS — I480 Paroxysmal atrial fibrillation: Secondary | ICD-10-CM | POA: Diagnosis not present

## 2023-01-06 DIAGNOSIS — E1122 Type 2 diabetes mellitus with diabetic chronic kidney disease: Secondary | ICD-10-CM | POA: Diagnosis not present

## 2023-01-06 DIAGNOSIS — I129 Hypertensive chronic kidney disease with stage 1 through stage 4 chronic kidney disease, or unspecified chronic kidney disease: Secondary | ICD-10-CM | POA: Diagnosis not present

## 2023-01-06 DIAGNOSIS — E785 Hyperlipidemia, unspecified: Secondary | ICD-10-CM | POA: Diagnosis not present

## 2023-01-22 DIAGNOSIS — N1832 Chronic kidney disease, stage 3b: Secondary | ICD-10-CM | POA: Diagnosis not present

## 2023-01-22 DIAGNOSIS — I129 Hypertensive chronic kidney disease with stage 1 through stage 4 chronic kidney disease, or unspecified chronic kidney disease: Secondary | ICD-10-CM | POA: Diagnosis not present

## 2023-03-18 DIAGNOSIS — Z7982 Long term (current) use of aspirin: Secondary | ICD-10-CM | POA: Diagnosis not present

## 2023-03-18 DIAGNOSIS — I700 Atherosclerosis of aorta: Secondary | ICD-10-CM | POA: Diagnosis not present

## 2023-03-18 DIAGNOSIS — N1831 Chronic kidney disease, stage 3a: Secondary | ICD-10-CM | POA: Diagnosis not present

## 2023-03-18 DIAGNOSIS — E1122 Type 2 diabetes mellitus with diabetic chronic kidney disease: Secondary | ICD-10-CM | POA: Diagnosis not present

## 2023-03-18 DIAGNOSIS — M1990 Unspecified osteoarthritis, unspecified site: Secondary | ICD-10-CM | POA: Diagnosis not present

## 2023-03-18 DIAGNOSIS — E11620 Type 2 diabetes mellitus with diabetic dermatitis: Secondary | ICD-10-CM | POA: Diagnosis not present

## 2023-03-18 DIAGNOSIS — D6869 Other thrombophilia: Secondary | ICD-10-CM | POA: Diagnosis not present

## 2023-03-18 DIAGNOSIS — Z008 Encounter for other general examination: Secondary | ICD-10-CM | POA: Diagnosis not present

## 2023-03-18 DIAGNOSIS — I4891 Unspecified atrial fibrillation: Secondary | ICD-10-CM | POA: Diagnosis not present

## 2023-03-18 DIAGNOSIS — E1136 Type 2 diabetes mellitus with diabetic cataract: Secondary | ICD-10-CM | POA: Diagnosis not present

## 2023-03-18 DIAGNOSIS — Z8249 Family history of ischemic heart disease and other diseases of the circulatory system: Secondary | ICD-10-CM | POA: Diagnosis not present

## 2023-03-18 DIAGNOSIS — E1151 Type 2 diabetes mellitus with diabetic peripheral angiopathy without gangrene: Secondary | ICD-10-CM | POA: Diagnosis not present

## 2023-03-18 DIAGNOSIS — F325 Major depressive disorder, single episode, in full remission: Secondary | ICD-10-CM | POA: Diagnosis not present

## 2023-03-24 ENCOUNTER — Other Ambulatory Visit: Payer: Self-pay | Admitting: Cardiology

## 2023-03-25 NOTE — Telephone Encounter (Signed)
 Prescription refill request for Eliquis received. Indication: PAF Last office visit: 11/20/22  John Sievert MD Scr: 1.43 on 11/24/22 Age: 78 Weight: 68.9kg  Based on above findings Eliquis 5mg  twice daily is the appropriate dose.  Refill approved.

## 2023-03-27 ENCOUNTER — Other Ambulatory Visit: Payer: Self-pay | Admitting: Cardiology

## 2023-03-27 DIAGNOSIS — I739 Peripheral vascular disease, unspecified: Secondary | ICD-10-CM

## 2023-03-27 DIAGNOSIS — I6523 Occlusion and stenosis of bilateral carotid arteries: Secondary | ICD-10-CM

## 2023-04-25 ENCOUNTER — Other Ambulatory Visit: Payer: Self-pay | Admitting: Cardiology

## 2023-05-25 ENCOUNTER — Ambulatory Visit: Payer: Medicare HMO | Attending: Cardiology | Admitting: Cardiology

## 2023-05-25 ENCOUNTER — Encounter: Payer: Self-pay | Admitting: Cardiology

## 2023-05-25 VITALS — BP 122/61 | HR 62 | Resp 16 | Ht 71.0 in | Wt 153.6 lb

## 2023-05-25 DIAGNOSIS — I6523 Occlusion and stenosis of bilateral carotid arteries: Secondary | ICD-10-CM | POA: Diagnosis not present

## 2023-05-25 DIAGNOSIS — I739 Peripheral vascular disease, unspecified: Secondary | ICD-10-CM

## 2023-05-25 DIAGNOSIS — R011 Cardiac murmur, unspecified: Secondary | ICD-10-CM

## 2023-05-25 DIAGNOSIS — I10 Essential (primary) hypertension: Secondary | ICD-10-CM | POA: Diagnosis not present

## 2023-05-25 DIAGNOSIS — I480 Paroxysmal atrial fibrillation: Secondary | ICD-10-CM

## 2023-05-25 NOTE — Progress Notes (Signed)
 Cardiology Office Note:  .   Date:  05/25/2023  ID:  John Columbus., DOB 1945/02/14, MRN [id number] PCP: John Morita, MD  [facility] Providers Cardiologist:  John Ivy, MD PCP: John Morita, MD  Chief Complaint  Patient presents with   PAD   Atrial Fibrillation   Hypertension   Follow-up    6 months     John Heagerty. is a 78 y.o. male with hypertension, hyperlipidemia, type 2 diabetes mellitus, paroxysmal A-fib, PAD w/claudication, asymptomatic carotid artery disease, AAA 3.2 cm, COPD   Patient is here with his wife.  As previously noted, wife reports that patient has been lethargic, taking little interest in day-to-day activities.  Sleep pattern has also been altered, stays up to 4 AM, and sleeps during the daytime.  He has appointment with his PCP Dr. Paulla Woodward tomorrow.  From cardiac standpoint, denies any chest pain, shortness of breath.  Does not do much walking at all, but does report claudication with walking.  He does not have any open wounds or injuries on his feet.  It appears that he has been taking Eliquis  5 mg orally once daily as opposed to twice daily, primarily due to cost issues.    Vitals:   05/25/23 1116  BP: 122/61  Pulse: 62  Resp: 16  SpO2: 92%      ROS      Studies Reviewed: John Woodward        EKG 05/25/2023: Normal sinus rhythm Septal infarct (cited on or before 11-Nov-2022) When compared with ECG of 11-Nov-2022 12:49, Questionable change in initial forces of Septal leads Nonspecific T wave abnormality has replaced inverted T waves in Inferior leads  Independently interpreted 10/2022: Chol 140, TG 88, HDL 51, LDL 71 HbA1C 6.7% Hb 14.1 Cr 1.3 eGFR 47  Risk Assessment/Calculations:    CHA2DS2-VASc Score = 4   This indicates a 4.8% annual risk of stroke. The patient's score is based upon: CHF History: 0 HTN History: 1 Diabetes History: 0 Stroke History: 0 Vascular Disease History: 1 Age Score:  2 Gender Score: 0    Physical Exam Vitals and nursing note reviewed.  Constitutional:      General: He is not in acute distress. Neck:     Vascular: No JVD.  Cardiovascular:     Rate and Rhythm: Normal rate and regular rhythm.     Heart sounds: Murmur heard.     High-pitched blowing holosystolic murmur is present with a grade of 3/6 at the apex.  Pulmonary:     Effort: Pulmonary effort is normal.     Breath sounds: Normal breath sounds. No wheezing or rales.      VISIT DIAGNOSES:   ICD-10-CM   1. PAD (peripheral artery disease) (HCC)  I73.9 EKG 12-Lead    2. Paroxysmal atrial fibrillation (HCC)  I48.0     3. Bilateral carotid artery stenosis  I65.23     4. Essential hypertension  I10     5. Murmur  R01.1 ECHOCARDIOGRAM COMPLETE       John Ramsier. is a 78 y.o. male with hypertension, hyperlipidemia, type 2 diabetes mellitus, paroxysmal A-fib, PAD w/claudication, asymptomatic carotid artery disease, AAA 3.2 cm, COPD   Assessment & Plan  PAF: Maintaining sinus rhythm. Emphasized importance of taking Eliquis  5 mg twice daily, as prescribed and recommended. Offered patient assistance to help with cost issues.   PAD, AAA: Asymptomatic carotid artery disease, and symptomatic lower extremity PAD with claudication. No  resting leg ischemia or critical limb ischemia. Small incidentally found AAA 3.2 cm Continue medical management. Emphasized importance of increasing physical activity with regular walking. In addition to Eliquis  for A-fib, agree with aspirin  81 mg daily for PAD. Caution patient against use of any NSAIDs. Continue current 40 mg daily, he would like to get lipid panel checked with PCP Dr. Paulla Woodward tomorrow. Increase Crestor  from 20 mg daily to 40 mg daily.  He will have repeat lipid panel with Dr. Paulla Woodward next few months. Blood pressure is well-controlled.  He was previously on telmisartan , but was stopped due to hyperkalemia with K of 6.2. Congratulated  patient on success quitting smoking. Continue follow-up with Dr. Charlotte Woodward.  Murmur: Very prominent holosystolic murmur noted.  I suspect his mitral regurgitation has worsened.  Will check echocardiogram.  As such, he remains asymptomatic from presumed severe mitral regurgitation.   Generalized fatigue: While patient denies this, wife has noticed increasing sleep pattern changes, fatigue, generalized lack of energy.  Differentials should include depression as well as dementia.  I encouraged him to discuss this further with Dr. Paulla Woodward during his office visit tomorrow.      F/u in 6 months  Signed, John Das, MD

## 2023-05-25 NOTE — Patient Instructions (Signed)
 Medication Instructions:  Start patient assistance for eliquis    *If you need a refill on your cardiac medications before your next appointment, please call your pharmacy*  Testing/Procedures: Echo   Your physician has requested that you have an echocardiogram. Echocardiography is a painless test that uses sound waves to create images of your heart. It provides your doctor with information about the size and shape of your heart and how well your heart's chambers and valves are working. This procedure takes approximately one hour. There are no restrictions for this procedure. Please do NOT wear cologne, perfume, aftershave, or lotions (deodorant is allowed). Please arrive 15 minutes prior to your appointment time.  Please note: We ask at that you not bring children with you during ultrasound (echo/ vascular) testing. Due to room size and safety concerns, children are not allowed in the ultrasound rooms during exams. Our front office staff cannot provide observation of children in our lobby area while testing is being conducted. An adult accompanying a patient to their appointment will only be allowed in the ultrasound room at the discretion of the ultrasound technician under special circumstances. We apologize for any inconvenience.   Follow-Up: At [facility], you and your health needs are our priority.  As part of our continuing mission to provide you with exceptional heart care, our providers are all part of one team.  This team includes your primary Cardiologist (physician) and Advanced Practice Providers or APPs (Physician Assistants and Nurse Practitioners) who all work together to provide you with the care you need, when you need it.  Your next appointment:   6 month(s)  Provider:   Cody Das, MD    We recommend signing up for the patient portal called "MyChart".  Sign up information is provided on this After Visit Summary.  MyChart is used to connect with patients  for Virtual Visits (Telemedicine).  Patients are able to view lab/test results, encounter notes, upcoming appointments, etc.  Non-urgent messages can be sent to your provider as well.   To learn more about what you can do with MyChart, go to [email or web address].

## 2023-05-26 ENCOUNTER — Encounter: Payer: Self-pay | Admitting: Family Medicine

## 2023-05-26 ENCOUNTER — Ambulatory Visit (INDEPENDENT_AMBULATORY_CARE_PROVIDER_SITE_OTHER): Payer: Medicare HMO | Admitting: Family Medicine

## 2023-05-26 VITALS — BP 104/60 | HR 69 | Temp 98.3°F | Resp 20 | Ht 71.0 in | Wt 153.1 lb

## 2023-05-26 DIAGNOSIS — E785 Hyperlipidemia, unspecified: Secondary | ICD-10-CM | POA: Diagnosis not present

## 2023-05-26 DIAGNOSIS — Z125 Encounter for screening for malignant neoplasm of prostate: Secondary | ICD-10-CM

## 2023-05-26 DIAGNOSIS — Z0000 Encounter for general adult medical examination without abnormal findings: Secondary | ICD-10-CM

## 2023-05-26 DIAGNOSIS — E1169 Type 2 diabetes mellitus with other specified complication: Secondary | ICD-10-CM

## 2023-05-26 LAB — CBC WITH DIFFERENTIAL/PLATELET
Basophils Absolute: 0.1 10*3/uL (ref 0.0–0.1)
Basophils Relative: 1.1 % (ref 0.0–3.0)
Eosinophils Absolute: 0.3 10*3/uL (ref 0.0–0.7)
Eosinophils Relative: 5.4 % — ABNORMAL HIGH (ref 0.0–5.0)
HCT: 42.5 % (ref 39.0–52.0)
Hemoglobin: 14.3 g/dL (ref 13.0–17.0)
Lymphocytes Relative: 21.9 % (ref 12.0–46.0)
Lymphs Abs: 1.1 10*3/uL (ref 0.7–4.0)
MCHC: 33.8 g/dL (ref 30.0–36.0)
MCV: 94.8 fl (ref 78.0–100.0)
Monocytes Absolute: 0.3 10*3/uL (ref 0.1–1.0)
Monocytes Relative: 5.6 % (ref 3.0–12.0)
Neutro Abs: 3.2 10*3/uL (ref 1.4–7.7)
Neutrophils Relative %: 66 % (ref 43.0–77.0)
Platelets: 262 10*3/uL (ref 150.0–400.0)
RBC: 4.48 Mil/uL (ref 4.22–5.81)
RDW: 14.3 % (ref 11.5–15.5)
WBC: 4.9 10*3/uL (ref 4.0–10.5)

## 2023-05-26 LAB — BASIC METABOLIC PANEL WITH GFR
BUN: 20 mg/dL (ref 6–23)
CO2: 29 meq/L (ref 19–32)
Calcium: 8.7 mg/dL (ref 8.4–10.5)
Chloride: 104 meq/L (ref 96–112)
Creatinine, Ser: 1.22 mg/dL (ref 0.40–1.50)
GFR: 57.23 mL/min — ABNORMAL LOW (ref >60.00)
Glucose, Bld: 262 mg/dL — ABNORMAL HIGH (ref 70–99)
Potassium: 3.8 meq/L (ref 3.5–5.1)
Sodium: 141 meq/L (ref 135–145)

## 2023-05-26 LAB — POCT GLYCOSYLATED HEMOGLOBIN (HGB A1C): Hemoglobin A1C: 6 % — AB (ref 4.0–5.6)

## 2023-05-26 LAB — MICROALBUMIN / CREATININE URINE RATIO
Creatinine,U: 216.5 mg/dL
Microalb Creat Ratio: 285.8 mg/g — ABNORMAL HIGH (ref 0.0–30.0)
Microalb, Ur: 61.9 mg/dL — ABNORMAL HIGH (ref 0.0–1.9)

## 2023-05-26 LAB — LIPID PANEL
Cholesterol: 130 mg/dL (ref 0–200)
HDL: 43.8 mg/dL (ref >39.00)
LDL Cholesterol: 49 mg/dL (ref 0–99)
NonHDL: 85.76
Total CHOL/HDL Ratio: 3
Triglycerides: 185 mg/dL — ABNORMAL HIGH (ref 0.0–149.0)
VLDL: 37 mg/dL (ref 0.0–40.0)

## 2023-05-26 LAB — HEPATIC FUNCTION PANEL
ALT: 36 U/L (ref 0–53)
AST: 28 U/L (ref 0–37)
Albumin: 4.1 g/dL (ref 3.5–5.2)
Alkaline Phosphatase: 64 U/L (ref 39–117)
Bilirubin, Direct: 0.1 mg/dL (ref 0.0–0.3)
Total Bilirubin: 0.4 mg/dL (ref 0.2–1.2)
Total Protein: 6.2 g/dL (ref 6.0–8.3)

## 2023-05-26 LAB — TSH: TSH: 1.76 u[IU]/mL (ref 0.35–5.50)

## 2023-05-26 LAB — PSA, MEDICARE: PSA: 0.18 ng/mL (ref 0.10–4.00)

## 2023-05-26 NOTE — Progress Notes (Signed)
   Subjective:    Patient ID: John Woodward., male    DOB: May 06, 1945, 78 y.o.   MRN: [id number]  HPI CPE- UTD on colonoscopy, PNA.  Due for foot exam, microalbumin.  Due for eye exam.  Pt reports feeling 'pretty good'.    Patient Care Team    Relationship Specialty Notifications Start End  Jess Morita, MD PCP - General Family Medicine  01/12/22   Cody Das, MD PCP - Cardiology Cardiology  05/25/23   Clyde Darling, DO Consulting Physician Family Medicine  09/10/16   Jasmine Mesi, MD Consulting Physician Orthopedic Surgery  09/10/16   Rolando Cliche, [facility] Pharmacist Pharmacist Admissions 05/10/19    Comment: PHONE NUMBER [phone]     Health Maintenance  Topic Date Due   DTaP/Tdap/Td (1 - Tdap) Never done   COVID-19 Vaccine (4 - 2024-25 season) 09/27/2022   Diabetic kidney evaluation - Urine ACR  01/09/2023   OPHTHALMOLOGY EXAM  02/17/2023   Medicare Annual Wellness (AWV)  04/16/2023   HEMOGLOBIN A1C  05/25/2023   INFLUENZA VACCINE  08/27/2023   Diabetic kidney evaluation - eGFR measurement  11/24/2023   FOOT EXAM  05/25/2024   Colonoscopy  09/14/2030   Pneumonia Vaccine 4+ Years old  Completed   Hepatitis C Screening  Completed   HPV VACCINES  Aged Out   Meningococcal B Vaccine  Aged Out   Zoster Vaccines- Shingrix  Discontinued      Review of Systems Patient reports no vision/hearing changes, anorexia, fever ,adenopathy, persistant/recurrent hoarseness, swallowing issues, chest pain, palpitations, edema, persistant/recurrent cough, hemoptysis, dyspnea (rest,exertional, paroxysmal nocturnal), gastrointestinal  bleeding (melena, rectal bleeding), abdominal pain, excessive heart burn, GU symptoms (dysuria, hematuria, voiding/incontinence issues) syncope, focal weakness, memory loss, numbness & tingling, skin/hair/nail changes, depression, anxiety, abnormal bruising/bleeding, musculoskeletal symptoms/signs.     Objective:   Physical Exam General  Appearance:    Alert, cooperative, no distress, appears stated age  Head:    Normocephalic, without obvious abnormality, atraumatic  Eyes:    PERRL, conjunctiva/corneas clear, EOM's intact both eyes       Ears:    Normal TM's and external ear canals, both ears  Nose:   Nares normal, septum midline, mucosa normal, no drainage   or sinus tenderness  Throat:   Lips, mucosa, and tongue normal; teeth and gums normal  Neck:   Supple, symmetrical, trachea midline, no adenopathy;       thyroid :  No enlargement/tenderness/nodules  Back:     Symmetric, no curvature, ROM normal, no CVA tenderness  Lungs:     Clear to auscultation bilaterally, respirations unlabored  Chest wall:    No tenderness or deformity  Heart:    Regular rate and rhythm, S1 and S2 normal, + murmur  Abdomen:     Soft, non-tender, bowel sounds active all four quadrants,    no masses, no organomegaly  Genitalia:    deferred  Rectal:    Extremities:   Extremities normal, atraumatic, no cyanosis or edema  Pulses:   2+ and symmetric all extremities  Skin:   Skin color, texture, turgor normal, no rashes or lesions  Lymph nodes:   Cervical, supraclavicular, and axillary nodes normal  Neurologic:   CNII-XII intact. Normal strength, sensation and reflexes      throughout          Assessment & Plan:

## 2023-05-26 NOTE — Patient Instructions (Signed)
 Follow up in 6 months to recheck BP, cholesterol, and sugar We'll notify you of your lab results and make any changes if needed Continue to walk regularly.  Physical activity is great! Schedule your eye exam at your convenience Call with any questions or concerns Stay Safe!  Stay Healthy! Happy Spring!!!

## 2023-05-26 NOTE — Assessment & Plan Note (Signed)
 Pt's PE unchanged from previous and WNL w/ exception of known murmur.  UTD on colonoscopy, PNA.  Foot exam done today.  Microalbumin ordered.  Pt reports feeling well and has no complaints today.  Check labs.  Anticipatory guidance provided.

## 2023-05-27 ENCOUNTER — Encounter: Payer: Self-pay | Admitting: Family Medicine

## 2023-05-27 MED ORDER — LOSARTAN POTASSIUM 50 MG PO TABS: 50.0000 mg | ORAL_TABLET | Freq: Every day | ORAL | 3 refills | Status: DC

## 2023-05-27 NOTE — Telephone Encounter (Signed)
 Lab results have been discussed.   Verbalized understanding? Yes  Are there any questions? No   Patient's wife wanted to let Dr.Tabori know they are seeing the kidney Dr. Norvin Bees.  Follow up appointment made 06/16/2023 Medication has been sent to pharmacy     Which labs would you like me to order and the dx?

## 2023-05-27 NOTE — Telephone Encounter (Signed)
-----   Message from Laymon Priest sent at 05/27/2023  7:30 AM EDT ----- Labs are stable and look good w/ the exception of your urine microalbumin.  This has jumped considerably since stopping your Telmisartan  (which is a blood pressure medicine that protects your kidneys from the long term effects of diabetes).  Based on these labs, we need to restart a medication that will protect your kidneys.  We will START Losartan  50mg  daily (#30, 3 refills) and DECREASE your Amlodipine  to 1/2 tab daily.  I would like to see you back in office in 2-3 weeks to recheck blood pressure (make sure it's not too low) and also recheck your potassium and kidney function.

## 2023-05-28 DIAGNOSIS — J439 Emphysema, unspecified: Secondary | ICD-10-CM | POA: Diagnosis not present

## 2023-05-28 DIAGNOSIS — E1122 Type 2 diabetes mellitus with diabetic chronic kidney disease: Secondary | ICD-10-CM | POA: Diagnosis not present

## 2023-05-28 DIAGNOSIS — R809 Proteinuria, unspecified: Secondary | ICD-10-CM | POA: Diagnosis not present

## 2023-05-28 DIAGNOSIS — N179 Acute kidney failure, unspecified: Secondary | ICD-10-CM | POA: Diagnosis not present

## 2023-05-28 DIAGNOSIS — N1832 Chronic kidney disease, stage 3b: Secondary | ICD-10-CM | POA: Diagnosis not present

## 2023-05-28 DIAGNOSIS — I129 Hypertensive chronic kidney disease with stage 1 through stage 4 chronic kidney disease, or unspecified chronic kidney disease: Secondary | ICD-10-CM | POA: Diagnosis not present

## 2023-06-08 DIAGNOSIS — H5203 Hypermetropia, bilateral: Secondary | ICD-10-CM | POA: Diagnosis not present

## 2023-06-08 DIAGNOSIS — H2513 Age-related nuclear cataract, bilateral: Secondary | ICD-10-CM | POA: Diagnosis not present

## 2023-06-08 DIAGNOSIS — H524 Presbyopia: Secondary | ICD-10-CM | POA: Diagnosis not present

## 2023-06-08 DIAGNOSIS — E119 Type 2 diabetes mellitus without complications: Secondary | ICD-10-CM | POA: Diagnosis not present

## 2023-06-08 DIAGNOSIS — Z7984 Long term (current) use of oral hypoglycemic drugs: Secondary | ICD-10-CM | POA: Diagnosis not present

## 2023-06-08 DIAGNOSIS — H52223 Regular astigmatism, bilateral: Secondary | ICD-10-CM | POA: Diagnosis not present

## 2023-06-08 LAB — HM DIABETES EYE EXAM

## 2023-06-16 ENCOUNTER — Ambulatory Visit: Payer: Self-pay | Admitting: Family Medicine

## 2023-06-16 ENCOUNTER — Ambulatory Visit (INDEPENDENT_AMBULATORY_CARE_PROVIDER_SITE_OTHER): Admitting: Family Medicine

## 2023-06-16 ENCOUNTER — Encounter: Payer: Self-pay | Admitting: Family Medicine

## 2023-06-16 VITALS — BP 94/40 | HR 73 | Temp 97.8°F | Ht 71.0 in | Wt 151.0 lb

## 2023-06-16 DIAGNOSIS — I10 Essential (primary) hypertension: Secondary | ICD-10-CM | POA: Diagnosis not present

## 2023-06-16 LAB — BASIC METABOLIC PANEL WITH GFR
BUN: 18 mg/dL (ref 6–23)
CO2: 26 meq/L (ref 19–32)
Calcium: 8.8 mg/dL (ref 8.4–10.5)
Chloride: 104 meq/L (ref 96–112)
Creatinine, Ser: 1.29 mg/dL (ref 0.40–1.50)
GFR: 53.5 mL/min — ABNORMAL LOW (ref >60.00)
Glucose, Bld: 226 mg/dL — ABNORMAL HIGH (ref 70–99)
Potassium: 3.7 meq/L (ref 3.5–5.1)
Sodium: 141 meq/L (ref 135–145)

## 2023-06-16 NOTE — Patient Instructions (Signed)
 Follow up in 3 weeks to recheck blood pressure STOP the Hydralazine  CONTINUE the Amlodipine  1/2 tab daily CONTINUE Losartan  daily Call with any questions or concerns Stay Safe!  Stay Healthy! Happy Memorial Day!!!

## 2023-06-16 NOTE — Progress Notes (Signed)
   Subjective:    Patient ID: John Woodward., male    DOB: 1945-10-29, 78 y.o.   MRN: [id number]  HPI HTN- chronic problem.  At last visit, microalbumin was high which necessitated starting an ARB.  We added Losartan  50mg  daily and decreased Amlodipine  to 5mg  daily.  He is also on Hydralazine  50mg  TID- is only taking twice daily on most days.  Has been to Nephrology and they are in agreement w/ plan.  Today BP 94/40.  Denies dizziness.  No SOB.     Review of Systems For ROS see HPI     Objective:   Physical Exam Vitals reviewed.  Constitutional:      General: He is not in acute distress.    Appearance: Normal appearance. He is well-developed. He is not ill-appearing.  HENT:     Head: Normocephalic and atraumatic.  Eyes:     Extraocular Movements: Extraocular movements intact.     Conjunctiva/sclera: Conjunctivae normal.     Pupils: Pupils are equal, round, and reactive to light.  Neck:     Thyroid : No thyromegaly.  Cardiovascular:     Rate and Rhythm: Normal rate and regular rhythm.     Pulses: Normal pulses.     Heart sounds: Normal heart sounds. No murmur heard. Pulmonary:     Effort: Pulmonary effort is normal. No respiratory distress.     Breath sounds: Normal breath sounds.  Abdominal:     General: Bowel sounds are normal. There is no distension.     Palpations: Abdomen is soft.  Musculoskeletal:     Cervical back: Normal range of motion and neck supple.     Right lower leg: No edema.     Left lower leg: No edema.  Lymphadenopathy:     Cervical: No cervical adenopathy.  Skin:    General: Skin is warm and dry.  Neurological:     General: No focal deficit present.     Mental Status: He is alert and oriented to person, place, and time.     Cranial Nerves: No cranial nerve deficit.  Psychiatric:        Mood and Affect: Mood normal.        Behavior: Behavior normal.           Assessment & Plan:

## 2023-06-16 NOTE — Assessment & Plan Note (Signed)
 Chronic problem.  Currently hypotensive after starting Losartan  for his elevated microalbuminuria.  Will stop Hydralazine .  Continue 1/2 tab Amlodipine  and continue Losartan  for renal protection.  Check BMP.  Will have pt return in 3-4 weeks to recheck BP after stopping Hydralazine .  Pt expressed understanding and is in agreement w/ plan.

## 2023-06-30 ENCOUNTER — Ambulatory Visit (HOSPITAL_COMMUNITY)
Admission: RE | Admit: 2023-06-30 | Discharge: 2023-06-30 | Disposition: A | Discharge status: Home / Self Care | Source: Ambulatory Visit | Attending: Cardiology | Admitting: Cardiology

## 2023-06-30 DIAGNOSIS — R011 Cardiac murmur, unspecified: Secondary | ICD-10-CM | POA: Diagnosis not present

## 2023-06-30 LAB — ECHOCARDIOGRAM COMPLETE
Area-P 1/2: 2.26 cm2
S' Lateral: 2.4 cm

## 2023-07-06 ENCOUNTER — Ambulatory Visit: Payer: Self-pay | Admitting: Cardiology

## 2023-07-06 NOTE — Progress Notes (Signed)
 Thickening of part of heart muscle likely causing the murmur. MRI can provide more clues, but in absence of symptoms, we can check echocardiogram in 1 year.  Thanks MJP

## 2023-07-07 ENCOUNTER — Ambulatory Visit (INDEPENDENT_AMBULATORY_CARE_PROVIDER_SITE_OTHER): Admitting: Family Medicine

## 2023-07-07 ENCOUNTER — Encounter: Payer: Self-pay | Admitting: Family Medicine

## 2023-07-07 VITALS — BP 118/50 | HR 62 | Temp 98.0°F | Ht 71.0 in | Wt 152.0 lb

## 2023-07-07 DIAGNOSIS — I10 Essential (primary) hypertension: Secondary | ICD-10-CM

## 2023-07-07 NOTE — Progress Notes (Signed)
   Subjective:    Patient ID: John Columbus., male    DOB: 10-14-1945, 78 y.o.   MRN: [id number]  HPI HTN- chronic problem.  At last visit, BP was 94/40.  We stopped the Hydralazine  in order to continue the Losartan  for renal protection.  Amlodipine  is 1/2 tab daily (5mg ).  Pt feels that he has a bit more energy.  No CP, SOB, HA's, visual changes, edema.   Review of Systems For ROS see HPI     Objective:   Physical Exam Vitals reviewed.  Constitutional:      General: He is not in acute distress.    Appearance: Normal appearance. He is well-developed. He is not ill-appearing.  HENT:     Head: Normocephalic and atraumatic.  Eyes:     Extraocular Movements: Extraocular movements intact.     Conjunctiva/sclera: Conjunctivae normal.     Pupils: Pupils are equal, round, and reactive to light.  Neck:     Thyroid : No thyromegaly.  Cardiovascular:     Rate and Rhythm: Normal rate and regular rhythm.     Pulses: Normal pulses.     Heart sounds: Normal heart sounds.  Pulmonary:     Effort: Pulmonary effort is normal. No respiratory distress.     Breath sounds: Normal breath sounds.  Abdominal:     General: Bowel sounds are normal. There is no distension.     Palpations: Abdomen is soft.  Musculoskeletal:     Cervical back: Normal range of motion and neck supple.     Right lower leg: No edema.     Left lower leg: No edema.  Lymphadenopathy:     Cervical: No cervical adenopathy.  Skin:    General: Skin is warm and dry.  Neurological:     General: No focal deficit present.     Mental Status: He is alert and oriented to person, place, and time.     Cranial Nerves: No cranial nerve deficit.  Psychiatric:        Mood and Affect: Mood normal.        Behavior: Behavior normal.          Assessment & Plan:

## 2023-07-07 NOTE — Patient Instructions (Signed)
 Follow up in 3 months to recheck sugar No med changes at this time Get rid of the Hydralazine  Continue the Amlodipine  1/2 tab daily Continue the Losartan  daily Call with any questions or concerns Stay Safe!  Stay Healthy! Have a great summer!!!

## 2023-07-08 ENCOUNTER — Ambulatory Visit: Admitting: Family Medicine

## 2023-07-20 NOTE — Assessment & Plan Note (Signed)
 Improved.  At last visit, BP was overcontrolled and pt was dealing w/ fatigue.  Since stopping Hydralazine , he reports having a bit more energy.  BP is closer to normal range.  Currently asymptomatic.  No med changes at this time.

## 2023-07-21 ENCOUNTER — Other Ambulatory Visit (HOSPITAL_COMMUNITY): Payer: Self-pay

## 2023-07-21 ENCOUNTER — Telehealth: Payer: Self-pay

## 2023-07-21 ENCOUNTER — Telehealth: Payer: Self-pay | Admitting: Cardiology

## 2023-07-21 ENCOUNTER — Other Ambulatory Visit: Payer: Self-pay

## 2023-07-21 MED ORDER — APIXABAN 5 MG PO TABS: 5.0000 mg | ORAL_TABLET | Freq: Two times a day (BID) | ORAL | Status: DC

## 2023-07-21 NOTE — Telephone Encounter (Signed)
 Medication name/dosage: Samples List: Eliquis  5 mg  Administration instructions: 1 tablet by mouth twice daily  Reason for samples: Reason for samples: unable to afford medication  Ordering provider: Dr. Elmira   *Once above information entered, route the phone encounter to CV DIV MAG ST SAMPLES and send Teams message to team member assigned to Samples for the day.

## 2023-07-21 NOTE — Telephone Encounter (Signed)
 Pharmacy Patient Advocate Encounter  Insurance verification completed.   The patient is insured through [employer]   Ran test claim for PRADAXA. Currently a quantity of 60 is a 30 day supply and the co-pay is NA . DRUG NON FORM ON PLAN/PRODUCT EXCLUSION  This test claim was processed through [facility]- copay amounts may vary at other pharmacies due to pharmacy/plan contracts, or as the patient moves through the different stages of their insurance plan.

## 2023-07-21 NOTE — Telephone Encounter (Signed)
 Pt dropped off Patient assistance paperwork.  Location: Dr. Elmira mailbox.

## 2023-07-21 NOTE — Telephone Encounter (Signed)
 Paperwork is in [employer]

## 2023-07-21 NOTE — Telephone Encounter (Signed)
 Pharmacy Patient Advocate Encounter  Insurance verification completed.   The patient is insured through [employer]  Ran test claim for ELIQUIS . Currently a quantity of 60 is a 30 day supply and the co-pay is $152.71 . The current 30 day co-pay is, $152.71.  No PA needed at this time.  This test claim was processed through [facility]- copay amounts may vary at other pharmacies due to pharmacy/plan contracts, or as the patient moves through the different stages of their insurance plan.

## 2023-07-21 NOTE — Telephone Encounter (Signed)
 Pharmacy Patient Advocate Encounter  Insurance verification completed.   The patient is insured through [employer]  Ran test claim for XARELTO. Currently a quantity of 30 is a 30 day supply and the co-pay is NA . THERAPY DUPLICATION, CLAIM WON'T GO THROUGH EVEN WITH [employer] DUR OVERRIDE SINCE INSURANCE PAID ELIQUIS  CLAIM RECENTLY. XARELTO SAME TIER ON PLAN AS ELIQUIS  SO LIKELY APPROX SAME COST TILL DEDUCTIBLE HAS BEEN MET.   This test claim was processed through [facility]- copay amounts may vary at other pharmacies due to pharmacy/plan contracts, or as the patient moves through the different stages of their insurance plan.

## 2023-07-22 NOTE — Telephone Encounter (Signed)
 Pt assistance forms received in Dr. Gilda mailbox to sign/date.  Dr. Elmira did sign and date the MD portion of the application.  Will place all completed forms in our pt assistance teams mailbox for further management.  Will route this message to our pt assistance team and Dr. Gilda covering RN, to make them aware of this.

## 2023-07-22 NOTE — Telephone Encounter (Signed)
 Signed.  Thanks MJP

## 2023-07-23 ENCOUNTER — Other Ambulatory Visit (HOSPITAL_COMMUNITY): Payer: Self-pay

## 2023-07-23 ENCOUNTER — Telehealth: Payer: Self-pay | Admitting: Pharmacy Technician

## 2023-07-23 NOTE — Telephone Encounter (Addendum)
 PAP: Application for Eliquis  has been submitted to [employer] ([employer]), via fax    Faxed patient portion, provider portion and insurance card. Missing out of pocket and proof of income

## 2023-07-23 NOTE — Telephone Encounter (Signed)
 Faxed application new encounter made

## 2023-07-25 ENCOUNTER — Encounter: Payer: Self-pay | Admitting: Cardiology

## 2023-07-25 DIAGNOSIS — Z7901 Long term (current) use of anticoagulants: Secondary | ICD-10-CM

## 2023-07-26 NOTE — Telephone Encounter (Signed)
 That may work well as it is once a day anyway.  Thanks MJP

## 2023-07-26 NOTE — Telephone Encounter (Signed)
 Application for Eliquis  patient assistance submitted.

## 2023-07-27 NOTE — Telephone Encounter (Signed)
 I called [employer] they have what we faxed.

## 2023-07-27 NOTE — Telephone Encounter (Signed)
 [employer] faxed saying they need updated provider address. Sent in with 5th floor on address

## 2023-07-29 ENCOUNTER — Other Ambulatory Visit (HOSPITAL_COMMUNITY): Payer: Self-pay

## 2023-07-29 NOTE — Telephone Encounter (Signed)
 Would there me other cheaper options or is coumadin the only thing cheaper?

## 2023-07-29 NOTE — Telephone Encounter (Signed)
 Referral placed and MyChart message has been sent to pt advising.

## 2023-07-29 NOTE — Telephone Encounter (Signed)
   Per test claims:  Dabigatran 150mg  would be 136.34 for 30 days Xarelto 10mg  would be 338.13 for 30 days Eliquis  is 152.71 for 30 days  Warfarin would be 2.00 for 30 days   I called and spoke to the wife and explained the options and the copays and she said she would like to see if it is ok for the patient to be changed to warfarin. They are aware they would have to come in and have INR checked often. She said he still has about 2 weeks left of eliquis . See other encounter for message to provider

## 2023-07-29 NOTE — Telephone Encounter (Signed)
 Okay with me.  Thanks MJP

## 2023-07-29 NOTE — Telephone Encounter (Signed)
 Pt spouse called in stating she understands that patient assistance forms have been started but she makes too much to qualify. She would like to discuss other cheaper options if available.

## 2023-08-03 ENCOUNTER — Telehealth: Payer: Self-pay

## 2023-08-03 DIAGNOSIS — I480 Paroxysmal atrial fibrillation: Secondary | ICD-10-CM

## 2023-08-03 NOTE — Telephone Encounter (Signed)
 Received notification of pt being enrolled in anticoagulation therapy and needs to become established with coumadin  clinic due to the expense of other anticoagulants. I attempted to call pt and pt's daughter, no answer. Left message on voicemail.

## 2023-08-04 MED ORDER — WARFARIN SODIUM 5 MG PO TABS
5.0000 mg | ORAL_TABLET | Freq: Every day | ORAL | 0 refills | Status: DC
Start: 2023-08-04 — End: 2023-08-30

## 2023-08-04 NOTE — Addendum Note (Signed)
 Addended by: JOESPH CHROMAN B on: 08/04/2023 03:26 PM   Modules accepted: Orders

## 2023-08-04 NOTE — Telephone Encounter (Signed)
 Attempted to call pt and pt's daughter concerning stating Warfarin and becoming established with our anticoagulation clinic. No answer. Left message on voicemail with call back number.

## 2023-08-04 NOTE — Telephone Encounter (Signed)
 Pt's daughter, Lyle returned call. I educated her on Warfarin and the need for frequent and regular monitoring, precise dosage adjustment and compliance. Side effects of potential bleeding were discussed. The patient should avoid any OTC items containing aspirin  or ibuprofen, and should avoid great swings in general diet. He should Avoid alcohol consumption. Lyle states pt currently has 3-4 weeks of Eliquis  left. I made her aware she should call our coumadin  clinic when pt has 1 week of Warfarin left to schedule coumadin  clinic appt and receive instructions on how to transition from Eliquis  to Warfarin. Prescription for Warfarin 5mg  sent to pharmacy (she understands pt should NOT start this medication until he calls the coumadin  clinic and is ready to switch to Warfarin). Lyle did also state her dad was forgetful at times  and all information and education should be provided to her or her mom.

## 2023-08-11 ENCOUNTER — Other Ambulatory Visit: Payer: Self-pay | Admitting: Cardiology

## 2023-08-11 ENCOUNTER — Encounter

## 2023-08-17 NOTE — Telephone Encounter (Signed)
 Called and spoke with pt's wife, [place]  (on [place]). She confirmed pt has approximately 1 week of Eliquis  left and has already picked up Warfarin from pharmacy. I educated her on Warfarin and the need to transition from Eliquis  to Warfarin. Pt will START Warfarin on Sunday, 08/22/23 and continue Eliquis  for 3 days (7/27, 7/28, & 7/29) On 7/30, pt will stop Eliquis  and continue Warfarin 5mg  daily. Scheduled New Coumadin  appt at [facility] on 08/30/23 at 10:30am. Virginia  verbalized understanding and was very appreciative.

## 2023-08-21 ENCOUNTER — Other Ambulatory Visit: Payer: Self-pay | Admitting: Family Medicine

## 2023-08-29 ENCOUNTER — Other Ambulatory Visit: Payer: Self-pay | Admitting: Cardiology

## 2023-08-29 DIAGNOSIS — I480 Paroxysmal atrial fibrillation: Secondary | ICD-10-CM

## 2023-08-30 ENCOUNTER — Ambulatory Visit: Attending: Cardiovascular Disease

## 2023-08-30 ENCOUNTER — Other Ambulatory Visit: Payer: Self-pay

## 2023-08-30 DIAGNOSIS — I4891 Unspecified atrial fibrillation: Secondary | ICD-10-CM | POA: Insufficient documentation

## 2023-08-30 DIAGNOSIS — Z8673 Personal history of transient ischemic attack (TIA), and cerebral infarction without residual deficits: Secondary | ICD-10-CM

## 2023-08-30 DIAGNOSIS — I480 Paroxysmal atrial fibrillation: Secondary | ICD-10-CM

## 2023-08-30 DIAGNOSIS — Z7901 Long term (current) use of anticoagulants: Secondary | ICD-10-CM | POA: Diagnosis not present

## 2023-08-30 LAB — POCT INR: INR: 5.8 — AB (ref 2.0–3.0)

## 2023-08-30 NOTE — Progress Notes (Signed)
 INR 5.8 Please see anticoagulation encounter

## 2023-08-30 NOTE — Patient Instructions (Signed)
 Hold Tuesday, Wednesday, Thursday and Friday then Decrease to 0.5 tablet Daily, except 1 tablet every Monday, Wednesday and Friday. Drink Boost/Ensure today and tomorrow.  INR in 1 week.  [phone]  A full discussion of the nature of anticoagulants has been carried out.  A benefit risk analysis has been presented to the patient, so that they understand the justification for choosing anticoagulation at this time. The need for frequent and regular monitoring, precise dosage adjustment and compliance is stressed.  Side effects of potential bleeding are discussed.  The patient should avoid any OTC items containing aspirin  or ibuprofen, and should avoid great swings in general diet.  Avoid alcohol consumption.  Call if any signs of abnormal bleeding.

## 2023-09-06 ENCOUNTER — Ambulatory Visit: Attending: Cardiology

## 2023-09-06 DIAGNOSIS — Z8673 Personal history of transient ischemic attack (TIA), and cerebral infarction without residual deficits: Secondary | ICD-10-CM | POA: Diagnosis not present

## 2023-09-06 DIAGNOSIS — Z7901 Long term (current) use of anticoagulants: Secondary | ICD-10-CM

## 2023-09-06 DIAGNOSIS — I480 Paroxysmal atrial fibrillation: Secondary | ICD-10-CM | POA: Diagnosis not present

## 2023-09-06 LAB — POCT INR: INR: 1.3 — AB (ref 2.0–3.0)

## 2023-09-06 NOTE — Patient Instructions (Addendum)
 Description   START taking 1/2 tablet daily, except 1 tablet every Monday, Wednesday and Friday.  Stay consistent with protein drink each week (1/2 every day)  Recheck INR in 1 week.   [facility] [phone]

## 2023-09-06 NOTE — Progress Notes (Signed)
 INR 1.3 Please see anticoagulation encounter

## 2023-09-11 ENCOUNTER — Other Ambulatory Visit: Payer: Self-pay | Admitting: Cardiology

## 2023-09-13 ENCOUNTER — Ambulatory Visit: Attending: Cardiology

## 2023-09-13 DIAGNOSIS — I480 Paroxysmal atrial fibrillation: Secondary | ICD-10-CM | POA: Diagnosis not present

## 2023-09-13 DIAGNOSIS — Z7901 Long term (current) use of anticoagulants: Secondary | ICD-10-CM

## 2023-09-13 DIAGNOSIS — Z8673 Personal history of transient ischemic attack (TIA), and cerebral infarction without residual deficits: Secondary | ICD-10-CM

## 2023-09-13 LAB — POCT INR: INR: 1.8 — AB (ref 2.0–3.0)

## 2023-09-13 NOTE — Patient Instructions (Signed)
 Take 1.5 tablets today only then START taking 1 tablet daily, except 0.5 tablet every Monday, Wednesday and Friday.  Stay consistent with protein drink each week (1 per day)  Recheck INR in 1 week.   [facility] [phone]

## 2023-09-13 NOTE — Progress Notes (Signed)
 INR 1.8 Please see anticoagulation encounter   Take 1.5 tablets today only then START taking 1 tablet daily, except 0.5 tablet every Monday, Wednesday and Friday.  Stay consistent with protein drink each week (1 per day)  Recheck INR in 1 week.   [facility] [phone]

## 2023-09-14 ENCOUNTER — Ambulatory Visit (INDEPENDENT_AMBULATORY_CARE_PROVIDER_SITE_OTHER)

## 2023-09-14 DIAGNOSIS — Z0000 Encounter for general adult medical examination without abnormal findings: Secondary | ICD-10-CM | POA: Diagnosis not present

## 2023-09-14 NOTE — Patient Instructions (Signed)
 [name] , Thank you for taking time to come for your Medicare Wellness Visit. I appreciate your ongoing commitment to your health goals. Please review the following plan we discussed and let me know if I can assist you in the future.   Screening recommendations/referrals: Colonoscopy: Education provided Recommended yearly ophthalmology/optometry visit for glaucoma screening and checkup Recommended yearly dental visit for hygiene and checkup  Vaccinations: Influenza vaccine: Education provided Pneumococcal vaccine: up to date Tdap vaccine: Education provided Shingles vaccine: up to date      Preventive Care 65 Years and Older, Male Preventive care refers to lifestyle choices and visits with your health care provider that can promote health and wellness. What does preventive care include? A yearly physical exam. This is also called an annual well check. Dental exams once or twice a year. Routine eye exams. Ask your health care provider how often you should have your eyes checked. Personal lifestyle choices, including: Daily care of your teeth and gums. Regular physical activity. Eating a healthy diet. Avoiding tobacco and drug use. Limiting alcohol use. Practicing safe sex. Taking low doses of aspirin  every day. Taking vitamin and mineral supplements as recommended by your health care provider. What happens during an annual well check? The services and screenings done by your health care provider during your annual well check will depend on your age, overall health, lifestyle risk factors, and family history of disease. Counseling  Your health care provider may ask you questions about your: Alcohol use. Tobacco use. Drug use. Emotional well-being. Home and relationship well-being. Sexual activity. Eating habits. History of falls. Memory and ability to understand (cognition). Work and work Astronomer. Screening  You may have the following tests or measurements: Height,  weight, and BMI. Blood pressure. Lipid and cholesterol levels. These may be checked every 5 years, or more frequently if you are over 76 years old. Skin check. Lung cancer screening. You may have this screening every year starting at age 78 if you have a 30-pack-year history of smoking and currently smoke or have quit within the past 15 years. Fecal occult blood test (FOBT) of the stool. You may have this test every year starting at age 40. Flexible sigmoidoscopy or colonoscopy. You may have a sigmoidoscopy every 5 years or a colonoscopy every 10 years starting at age 11. Prostate cancer screening. Recommendations will vary depending on your family history and other risks. Hepatitis C blood test. Hepatitis B blood test. Sexually transmitted disease (STD) testing. Diabetes screening. This is done by checking your blood sugar (glucose) after you have not eaten for a while (fasting). You may have this done every 1-3 years. Abdominal aortic aneurysm (AAA) screening. You may need this if you are a current or former smoker. Osteoporosis. You may be screened starting at age 68 if you are at high risk. Talk with your health care provider about your test results, treatment options, and if necessary, the need for more tests. Vaccines  Your health care provider may recommend certain vaccines, such as: Influenza vaccine. This is recommended every year. Tetanus, diphtheria, and acellular pertussis (Tdap, Td) vaccine. You may need a Td booster every 10 years. Zoster vaccine. You may need this after age 34. Pneumococcal 13-valent conjugate (PCV13) vaccine. One dose is recommended after age 80. Pneumococcal polysaccharide (PPSV23) vaccine. One dose is recommended after age 57. Talk to your health care provider about which screenings and vaccines you need and how often you need them. This information is not intended to replace advice  given to you by your health care provider. Make sure you discuss any  questions you have with your health care provider. Document Released: 02/08/2015 Document Revised: 10/02/2015 Document Reviewed: 11/13/2014 Elsevier Interactive Patient Education  2017 [employer].  Fall Prevention in the Home Falls can cause injuries. They can happen to people of all ages. There are many things you can do to make your home safe and to help prevent falls. What can I do on the outside of my home? Regularly fix the edges of walkways and driveways and fix any cracks. Remove anything that might make you trip as you walk through a door, such as a raised step or threshold. Trim any bushes or trees on the path to your home. Use bright outdoor lighting. Clear any walking paths of anything that might make someone trip, such as rocks or tools. Regularly check to see if handrails are loose or broken. Make sure that both sides of any steps have handrails. Any raised decks and porches should have guardrails on the edges. Have any leaves, snow, or ice cleared regularly. Use sand or salt on walking paths during winter. Clean up any spills in your garage right away. This includes oil or grease spills. What can I do in the bathroom? Use night lights. Install grab bars by the toilet and in the tub and shower. Do not use towel bars as grab bars. Use non-skid mats or decals in the tub or shower. If you need to sit down in the shower, use a plastic, non-slip stool. Keep the floor dry. Clean up any water that spills on the floor as soon as it happens. Remove soap buildup in the tub or shower regularly. Attach bath mats securely with double-sided non-slip rug tape. Do not have throw rugs and other things on the floor that can make you trip. What can I do in the bedroom? Use night lights. Make sure that you have a light by your bed that is easy to reach. Do not use any sheets or blankets that are too big for your bed. They should not hang down onto the floor. Have a firm chair that has side  arms. You can use this for support while you get dressed. Do not have throw rugs and other things on the floor that can make you trip. What can I do in the kitchen? Clean up any spills right away. Avoid walking on wet floors. Keep items that you use a lot in easy-to-reach places. If you need to reach something above you, use a strong step stool that has a grab bar. Keep electrical cords out of the way. Do not use floor polish or wax that makes floors slippery. If you must use wax, use non-skid floor wax. Do not have throw rugs and other things on the floor that can make you trip. What can I do with my stairs? Do not leave any items on the stairs. Make sure that there are handrails on both sides of the stairs and use them. Fix handrails that are broken or loose. Make sure that handrails are as long as the stairways. Check any carpeting to make sure that it is firmly attached to the stairs. Fix any carpet that is loose or worn. Avoid having throw rugs at the top or bottom of the stairs. If you do have throw rugs, attach them to the floor with carpet tape. Make sure that you have a light switch at the top of the stairs and the bottom of  the stairs. If you do not have them, ask someone to add them for you. What else can I do to help prevent falls? Wear shoes that: Do not have high heels. Have rubber bottoms. Are comfortable and fit you well. Are closed at the toe. Do not wear sandals. If you use a stepladder: Make sure that it is fully opened. Do not climb a closed stepladder. Make sure that both sides of the stepladder are locked into place. Ask someone to hold it for you, if possible. Clearly mark and make sure that you can see: Any grab bars or handrails. First and last steps. Where the edge of each step is. Use tools that help you move around (mobility aids) if they are needed. These include: Canes. Walkers. Scooters. Crutches. Turn on the lights when you go into a dark area.  Replace any light bulbs as soon as they burn out. Set up your furniture so you have a clear path. Avoid moving your furniture around. If any of your floors are uneven, fix them. If there are any pets around you, be aware of where they are. Review your medicines with your doctor. Some medicines can make you feel dizzy. This can increase your chance of falling. Ask your doctor what other things that you can do to help prevent falls. This information is not intended to replace advice given to you by your health care provider. Make sure you discuss any questions you have with your health care provider. Document Released: 11/08/2008 Document Revised: 06/20/2015 Document Reviewed: 02/16/2014 Elsevier Interactive Patient Education  2017 [employer].

## 2023-09-14 NOTE — Progress Notes (Signed)
 Subjective:   John Woodward. is a 78 y.o. male who presents for Medicare Annual/Subsequent preventive examination.  Visit Complete: Virtual I connected with  John Woodward. on 09/14/23 by a audio enabled telemedicine application and verified that I am speaking with the correct person using two identifiers.  Patient Location: Home  Provider Location: Home Office  I discussed the limitations of evaluation and management by telemedicine. The patient expressed understanding and agreed to proceed.  Vital Signs: Because this visit was a virtual/telehealth visit, some criteria may be missing or patient reported. Any vitals not documented were not able to be obtained and vitals that have been documented are patient reported.   Cardiac Risk Factors include: advanced age (>45men, >18 women);male gender;hypertension     Objective:    There were no vitals filed for this visit. There is no height or weight on file to calculate BMI.     09/14/2023    3:19 PM 11/11/2022   12:39 PM 06/29/2022    9:28 PM 06/29/2022   10:17 AM 04/16/2022   11:38 AM 04/16/2022   11:32 AM 12/09/2020   11:42 AM  Advanced Directives  Does Patient Have a Medical Advance Directive? Yes No  No Yes Yes Yes  Type of Psychiatrist of [employer] Power of Attorney   Copy of Healthcare Power of Attorney in Chart? Yes - validated most recent copy scanned in chart (See row information)    Yes - validated most recent copy scanned in chart (See row information) Yes - validated most recent copy scanned in chart (See row information)   Would patient like information on creating a medical advance directive?  No - Patient declined No - Patient declined        Current Medications (verified) Outpatient Encounter Medications as of 09/14/2023  Medication Sig   acetaminophen  (TYLENOL ) 500 MG tablet Take 500 mg by mouth every 6 (six) hours as needed.   amLODipine   (NORVASC ) 10 MG tablet TAKE 1 TABLET BY MOUTH EVERY DAY   aspirin  EC 81 MG tablet Take 1 tablet (81 mg total) by mouth daily. Swallow whole.   escitalopram  (LEXAPRO ) 20 MG tablet TAKE 1 TABLET BY MOUTH EVERY DAY   losartan  (COZAAR ) 50 MG tablet TAKE 1 TABLET BY MOUTH EVERY DAY   nicotine  polacrilex (COMMIT) 4 MG lozenge Take 1 lozenge (4 mg total) by mouth as needed for up to 90 doses for smoking cessation.   rosuvastatin  (CRESTOR ) 40 MG tablet TAKE 1 TABLET BY MOUTH EVERY DAY   warfarin (COUMADIN ) 5 MG tablet Take 1-2 tablets daily or as prescribed by coumadin  clinic   [DISCONTINUED] metoprolol  tartrate (LOPRESSOR ) 25 MG tablet Take 1 tablet (25 mg total) by mouth 2 (two) times daily.   No facility-administered encounter medications on file as of 09/14/2023.    Allergies (verified) Antibacterial hand soap [triclosan]   History: Past Medical History:  Diagnosis Date   Arthritis    Carotid artery disease (HCC)    Closed fracture dislocation of right elbow    30 years ago -- fell out of theback of a truck   Diabetes mellitus without complication (HCC)    Hx of colonic polyps 02/23/2017   Hyperlipidemia    Hypertension    Diagnosed 10 years ago. No on medication. Denies history of CAD.    Memory loss    PAD (peripheral artery disease) (HCC)    Past Surgical History:  Procedure Laterality Date   OLECRANON BURSECTOMY Right 08/18/2016   Procedure: OLECRANON BURSECTOMY;  Surgeon: Addie Glendia Hacker, MD;  Location: [facility] OR;  Service: Orthopedics;  Laterality: Right;   Family History  Problem Relation Age of Onset   Hypertension Mother    Dementia Mother        77s   Transient ischemic attack Mother        3s   Kidney disease Father    COPD Father    Hypertension Brother    Lung cancer Maternal Aunt    Alcohol abuse Maternal Grandfather    Mental illness Paternal Grandmother    Hypertension Daughter    Healthy Son    Learning disabilities Son    Colon cancer Neg Hx    Colon  polyps Neg Hx    Esophageal cancer Neg Hx    Rectal cancer Neg Hx    Stomach cancer Neg Hx    Social History   Socioeconomic History   Marital status: Married    Spouse name: John Woodward   Number of children: 3   Years of education: Not on file   Highest education level: 12th grade  Occupational History   Occupation: Retired    Comment: Curator  Tobacco Use   Smoking status: Former    Types: Cigarettes    Start date: 06/27/2022    Quit date: 1968    Years since quitting: 57.6   Smokeless tobacco: Never   Tobacco comments:    Taking Nicotine  lozenges  Vaping Use   Vaping status: Never Used  Substance and Sexual Activity   Alcohol use: No   Drug use: No   Sexual activity: Yes  Other Topics Concern   Not on file  Social History Narrative   Social Review      Patient lives at home with. Spouse John Woodward   Pt lives in a one or two story home? One story home   Does patient lives in a facility? If so where? Private home   What is patient highest level of education? 12 grade   Pt is RIGHT handed            Daughter died in 03-Jan-2022  Social Drivers of Health   Financial Resource Strain: Low Risk  (09/14/2023)   Overall Financial Resource Strain (CARDIA)    Difficulty of Paying Living Expenses: Not hard at all  Food Insecurity: No Food Insecurity (09/14/2023)   Hunger Vital Sign    Worried About Running Out of Food in the Last Year: Never true    Ran Out of Food in the Last Year: Never true  Transportation Needs: No Transportation Needs (09/14/2023)   PRAPARE - Administrator, Civil Service (Medical): No    Lack of Transportation (Non-Medical): No  Physical Activity: Inactive (09/14/2023)   Exercise Vital Sign    Days of Exercise per Week: 0 days    Minutes of Exercise per Session: 0 min  Stress: No Stress Concern Present (09/14/2023)   [employer] of Occupational Health - Occupational Stress Questionnaire    Feeling of Stress: Not at all   Social Connections: Moderately Integrated (09/14/2023)   Social Connection and Isolation Panel    Frequency of Communication with Friends and Family: More than three times a week    Frequency of Social Gatherings with Friends and Family: More than three times a week    Attends Religious Services: More than 4 times per year    Active  Member of Clubs or Organizations: No    Attends Engineer, structural: Never    Marital Status: Married    Tobacco Counseling Counseling given: Not Answered Tobacco comments: Taking Nicotine  lozenges   Clinical Intake:  Pre-visit preparation completed: Yes  Pain : No/denies pain     Diabetes: Yes CBG done?: No Did pt. bring in CBG monitor from home?: No  How often do you need to have someone help you when you read instructions, pamphlets, or other written materials from your doctor or pharmacy?: 1 - Never  Interpreter Needed?: No  Information entered by :: Mliss Graff LPN   Activities of Daily Living    09/14/2023    3:33 PM  In your present state of health, do you have any difficulty performing the following activities:  Hearing? 0  Vision? 0  Difficulty concentrating or making decisions? 0  Walking or climbing stairs? 0  Dressing or bathing? 0  Doing errands, shopping? 0  Preparing Food and eating ? N  Using the Toilet? N  In the past six months, have you accidently leaked urine? N  Do you have problems with loss of bowel control? N  Managing your Medications? N  Managing your Finances? N  Housekeeping or managing your Housekeeping? N    Patient Care Team: Mahlon Comer BRAVO, MD as PCP - General (Family Medicine) Elmira Newman PARAS, MD as PCP - Cardiology (Cardiology) Marquette Ozell BIRCH, DO as Consulting Physician (Family Medicine) Addie Cordella Hamilton, MD as Consulting Physician (Orthopedic Surgery) Pandora Cadet, [facility] as Pharmacist (Pharmacist)  Indicate any recent Medical Services you may have received from other  than [facility] providers in the past year (date may be approximate).     Assessment:   This is a routine wellness examination for John Woodward.  Hearing/Vision screen Hearing Screening - Comments:: Trouble hearing Vision Screening - Comments:: [place] Up to date   Goals Addressed             This Visit's Progress    Patient Stated       Continue current lifestyle       Depression Screen    09/14/2023    3:17 PM 05/26/2023   10:45 AM 11/24/2022   10:16 AM 08/10/2022    9:42 AM 04/16/2022   11:37 AM 01/08/2022   12:46 PM 06/10/2021    9:04 AM  PHQ 2/9 Scores  PHQ - 2 Score 0 0 2 2 2 2  0  PHQ- 9 Score 0 0 8 7 3 7  0    Fall Risk    09/14/2023    3:14 PM 05/26/2023   10:45 AM 08/10/2022    9:42 AM 04/16/2022   11:32 AM 01/08/2022   12:46 PM  Fall Risk   Falls in the past year? 0 0 0 0 0  Number falls in past yr: 0 0 0 0   Injury with Fall? 0 0 0 0   Risk for fall due to :  No Fall Risks No Fall Risks  No Fall Risks  Follow up Falls evaluation completed;Education provided;Falls prevention discussed Falls evaluation completed;Education provided Falls evaluation completed Falls evaluation completed;Education provided;Falls prevention discussed Falls evaluation completed      Data saved with a previous flowsheet row definition    MEDICARE RISK AT HOME: Medicare Risk at Home Any stairs in or around the home?: Yes If so, are there any without handrails?: No Home free of loose throw rugs in walkways, pet beds, electrical cords, etc?: Yes  Adequate lighting in your home to reduce risk of falls?: Yes Life alert?: No Use of a cane, walker or w/c?: No Grab bars in the bathroom?: Yes Shower chair or bench in shower?: Yes Elevated toilet seat or a handicapped toilet?: No  TIMED UP AND GO:  Was the test performed?  No    Cognitive Function:    06/15/2018   12:48 PM  MMSE - Mini Mental State Exam  Orientation to time 4  Orientation to Place 5  Registration 3  Attention/  Calculation 3  Recall 2  Language- name 2 objects 2  Language- repeat 1  Language- follow 3 step command 3  Language- read & follow direction 1  Write a sentence 1  Copy design 1  Total score 26      12/09/2020    2:00 PM 08/19/2018    9:00 AM  Montreal Cognitive Assessment   Visuospatial/ Executive (0/5) 5 5  Naming (0/3) 3 3  Attention: Read list of digits (0/2) 2 0  Attention: Read list of letters (0/1) 1 1  Attention: Serial 7 subtraction starting at 100 (0/3) 0 1  Language: Repeat phrase (0/2) 1 0  Language : Fluency (0/1) 0 0  Abstraction (0/2) 2 0  Delayed Recall (0/5) 2 0  Orientation (0/6) 4 5  Total 20 15  Adjusted Score (based on education) 20 16      09/14/2023    3:15 PM 04/16/2022   11:44 AM  6CIT Screen  What Year? 4 points 4 points  What month? 0 points 3 points  What time? 0 points   Count back from 20 0 points   Months in reverse 2 points   Repeat phrase 10 points   Total Score 16 points     Immunizations Immunization History  Administered Date(s) Administered   Fluad Quad(high Dose 65+) 10/19/2018, 12/05/2020   Influenza, High Dose Seasonal PF 10/30/2016, 02/04/2018   PFIZER Comirnaty(Gray Top)Covid-19 Tri-Sucrose Vaccine 04/05/2020   PFIZER(Purple Top)SARS-COV-2 Vaccination 03/26/2019, 04/26/2019   Pneumococcal Conjugate-13 09/10/2016   Pneumococcal Polysaccharide-23 02/04/2018    TDAP status: Due, Education has been provided regarding the importance of this vaccine. Advised may receive this vaccine at local pharmacy or Health Dept. Aware to provide a copy of the vaccination record if obtained from local pharmacy or Health Dept. Verbalized acceptance and understanding.  Flu Vaccine status: Due, Education has been provided regarding the importance of this vaccine. Advised may receive this vaccine at local pharmacy or Health Dept. Aware to provide a copy of the vaccination record if obtained from local pharmacy or Health Dept. Verbalized  acceptance and understanding.  Pneumococcal vaccine status: Up to date  Covid-19 vaccine status: Declined, Education has been provided regarding the importance of this vaccine but patient still declined. Advised may receive this vaccine at local pharmacy or Health Dept.or vaccine clinic. Aware to provide a copy of the vaccination record if obtained from local pharmacy or Health Dept. Verbalized acceptance and understanding.  Qualifies for Shingles Vaccine? No   Zostavax completed Yes   Shingrix Completed?: Yes  Screening Tests Health Maintenance  Topic Date Due   DTaP/Tdap/Td (1 - Tdap) Never done   INFLUENZA VACCINE  08/27/2023   HEMOGLOBIN A1C  11/25/2023   Diabetic kidney evaluation - Urine ACR  05/25/2024   FOOT EXAM  05/25/2024   OPHTHALMOLOGY EXAM  06/07/2024   Diabetic kidney evaluation - eGFR measurement  06/15/2024   Medicare Annual Wellness (AWV)  09/13/2024   Colonoscopy  09/14/2030   Pneumococcal Vaccine: 50+ Years  Completed   Hepatitis C Screening  Completed   HPV VACCINES  Aged Out   Meningococcal B Vaccine  Aged Out   Pneumococcal Vaccine  Discontinued   COVID-19 Vaccine  Discontinued   Zoster Vaccines- Shingrix  Discontinued    Health Maintenance  Health Maintenance Due  Topic Date Due   DTaP/Tdap/Td (1 - Tdap) Never done   INFLUENZA VACCINE  08/27/2023    Colorectal cancer screening: No longer required.   Lung Cancer Screening: (Low Dose CT Chest recommended if Age 36-80 years, 20 pack-year currently smoking OR have quit w/in 15years.) does not qualify.   Lung Cancer Screening Referral:   Additional Screening:  Hepatitis C Screening: does not qualify; Completed 2018  Vision Screening: Recommended annual ophthalmology exams for early detection of glaucoma and other disorders of the eye. Is the patient up to date with their annual eye exam?  No  Who is the provider or what is the name of the office in which the patient attends annual eye exams?  [facility] If pt is not established with a provider, would they like to be referred to a provider to establish care? No .   Dental Screening: Recommended annual dental exams for proper oral hygiene  Nutrition Risk Assessment:  Has the patient had any N/V/D within the last 2 months?  No  Does the patient have any non-healing wounds?  No  Has the patient had any unintentional weight loss or weight gain?  No   Diabetes:  Is the patient diabetic?  Yes  If diabetic, was a CBG obtained today?  No  Did the patient bring in their glucometer from home?  No  How often do you monitor your CBG's? 1 x a day.   Financial Strains and Diabetes Management:  Are you having any financial strains with the device, your supplies or your medication? No .  Does the patient want to be seen by Chronic Care Management for management of their diabetes?  No  Would the patient like to be referred to a Nutritionist or for Diabetic Management?  No   Diabetic Exams:  Diabetic Eye Exam: . Pt has been advised about the importance in completing this exam  Diabetic Foot Exam: Pt has been advised about the importance in completing this exam.   Community Resource Referral / Chronic Care Management: CRR required this visit?  No   CCM required this visit?  No     Plan:     I have personally reviewed and noted the following in the patient's chart:   Medical and social history Use of alcohol, tobacco or illicit drugs  Current medications and supplements including opioid prescriptions. Patient is not currently taking opioid prescriptions. Functional ability and status Nutritional status Physical activity Advanced directives List of other physicians Hospitalizations, surgeries, and ER visits in previous 12 months Vitals Screenings to include cognitive, depression, and falls Referrals and appointments  In addition, I have reviewed and discussed with patient certain preventive protocols, quality  metrics, and best practice recommendations. A written personalized care plan for preventive services as well as general preventive health recommendations were provided to patient.     Mliss Graff, LPN   1/80/7974   After Visit Summary: (MyChart) Due to this being a telephonic visit, the after visit summary with patients personalized plan was offered to patient via MyChart   Nurse Notes:

## 2023-09-24 ENCOUNTER — Ambulatory Visit: Attending: Cardiology

## 2023-09-24 DIAGNOSIS — Z7901 Long term (current) use of anticoagulants: Secondary | ICD-10-CM | POA: Diagnosis not present

## 2023-09-24 DIAGNOSIS — I480 Paroxysmal atrial fibrillation: Secondary | ICD-10-CM

## 2023-09-24 DIAGNOSIS — Z8673 Personal history of transient ischemic attack (TIA), and cerebral infarction without residual deficits: Secondary | ICD-10-CM

## 2023-09-24 LAB — POCT INR: INR: 1.7 — AB (ref 2.0–3.0)

## 2023-09-24 NOTE — Progress Notes (Signed)
 INR 1.7 Please see anticoagulation encounter START taking 1 tablet daily, except 0.5 tablet every Wednesday.  Stay consistent with protein drink each week (1 per day)  Recheck INR in 3 week.   Coumadin  Clinic [phone]

## 2023-09-24 NOTE — Patient Instructions (Signed)
 START taking 1 tablet daily, except 0.5 tablet every Wednesday.  Stay consistent with protein drink each week (1 per day)  Recheck INR in 3 week.   Coumadin  Clinic [phone]

## 2023-10-05 ENCOUNTER — Encounter: Payer: Self-pay | Admitting: Family Medicine

## 2023-10-05 ENCOUNTER — Ambulatory Visit: Admitting: Family Medicine

## 2023-10-05 VITALS — BP 104/62 | HR 68 | Temp 98.0°F | Ht 71.0 in | Wt 155.5 lb

## 2023-10-05 DIAGNOSIS — E119 Type 2 diabetes mellitus without complications: Secondary | ICD-10-CM | POA: Diagnosis not present

## 2023-10-05 DIAGNOSIS — F321 Major depressive disorder, single episode, moderate: Secondary | ICD-10-CM | POA: Diagnosis not present

## 2023-10-05 DIAGNOSIS — G479 Sleep disorder, unspecified: Secondary | ICD-10-CM

## 2023-10-05 LAB — BASIC METABOLIC PANEL WITH GFR
BUN: 17 mg/dL (ref 6–23)
CO2: 30 meq/L (ref 19–32)
Calcium: 9 mg/dL (ref 8.4–10.5)
Chloride: 104 meq/L (ref 96–112)
Creatinine, Ser: 1.31 mg/dL (ref 0.40–1.50)
GFR: 52.41 mL/min — ABNORMAL LOW (ref >60.00)
Glucose, Bld: 210 mg/dL — ABNORMAL HIGH (ref 70–99)
Potassium: 4.6 meq/L (ref 3.5–5.1)
Sodium: 142 meq/L (ref 135–145)

## 2023-10-05 LAB — HEMOGLOBIN A1C: Hgb A1c MFr Bld: 7.2 % — ABNORMAL HIGH (ref 4.6–6.5)

## 2023-10-05 MED ORDER — ESCITALOPRAM OXALATE 20 MG PO TABS: 20.0000 mg | ORAL_TABLET | Freq: Every day | ORAL | 3 refills | Status: AC

## 2023-10-05 NOTE — Patient Instructions (Signed)
 Follow up in 3-4 months to recheck blood pressure, cholesterol, sugar We'll notify you of your lab results and make any changes if needed Keep up the good work on healthy diet and regular physical activity- you can do it! Call with any questions or concerns Stay Safe!  Stay Healthy! Happy Fall!!!

## 2023-10-05 NOTE — Progress Notes (Unsigned)
   Subjective:    Patient ID: John Woodward., male    DOB: May 25, 1945, 78 y.o.   MRN: [id number]  HPI DM- ongoing issue.  Diet controlled.  Last A1C 6%.  UTD on eye exam, foot exam, microalbumin.  No CP, SOB, HA's, visual changes, abd pain, N/V.  Denies numbness/tingling of hands/feet.  Fatigue- wife reports he slept for '24 straight hrs last week'.  Will spend most of the night up and watching TV and at times will come to bed as wife is waking up.  Wife reports days and nights have been flipped since Christmas.  Wife states she just wants some answers as to 'why he's so different'- 'it's like I'm single'.   Review of Systems For ROS see HPI     Objective:   Physical Exam Vitals reviewed.  Constitutional:      General: He is not in acute distress.    Appearance: Normal appearance. He is well-developed. He is not ill-appearing.  HENT:     Head: Normocephalic and atraumatic.  Eyes:     Extraocular Movements: Extraocular movements intact.     Conjunctiva/sclera: Conjunctivae normal.     Pupils: Pupils are equal, round, and reactive to light.  Neck:     Thyroid : No thyromegaly.  Cardiovascular:     Rate and Rhythm: Normal rate and regular rhythm.     Pulses: Normal pulses.     Heart sounds: Normal heart sounds. No murmur heard. Pulmonary:     Effort: Pulmonary effort is normal. No respiratory distress.     Breath sounds: Normal breath sounds.  Abdominal:     General: Bowel sounds are normal. There is no distension.     Palpations: Abdomen is soft.  Musculoskeletal:     Cervical back: Normal range of motion and neck supple.     Right lower leg: No edema.     Left lower leg: No edema.  Lymphadenopathy:     Cervical: No cervical adenopathy.  Skin:    General: Skin is warm and dry.  Neurological:     General: No focal deficit present.     Mental Status: He is alert and oriented to person, place, and time.     Cranial Nerves: No cranial nerve deficit.  Psychiatric:         Mood and Affect: Mood normal.        Behavior: Behavior normal.           Assessment & Plan:

## 2023-10-06 ENCOUNTER — Ambulatory Visit: Payer: Self-pay | Admitting: Family Medicine

## 2023-10-10 DIAGNOSIS — G479 Sleep disorder, unspecified: Secondary | ICD-10-CM | POA: Insufficient documentation

## 2023-10-10 NOTE — Assessment & Plan Note (Signed)
 New.  After long discussion, it's not that pt is excessively fatigued- he still does what he wants to do (car shows, etc)- it's more that his days and nights are switched.  He is not necessarily interested in correcting this b/c he enjoys watching TV by himself at night.  Wife states that if he's happy, she'll adjust now that she knows there's nothing 'wrong'.

## 2023-10-10 NOTE — Assessment & Plan Note (Signed)
 Chronic problem.  Currently diet controlled.  UTD on eye exam, foot exam, microalbumin.  Last A1C 6%.  Currently asymptomatic.  Check labs and determine if meds are needed

## 2023-10-11 ENCOUNTER — Ambulatory Visit: Attending: Cardiology | Admitting: Pharmacist

## 2023-10-11 DIAGNOSIS — Z7901 Long term (current) use of anticoagulants: Secondary | ICD-10-CM

## 2023-10-11 DIAGNOSIS — I480 Paroxysmal atrial fibrillation: Secondary | ICD-10-CM

## 2023-10-11 DIAGNOSIS — I4891 Unspecified atrial fibrillation: Secondary | ICD-10-CM

## 2023-10-11 DIAGNOSIS — Z8673 Personal history of transient ischemic attack (TIA), and cerebral infarction without residual deficits: Secondary | ICD-10-CM

## 2023-10-11 LAB — POCT INR: INR: 3 (ref 2.0–3.0)

## 2023-10-11 NOTE — Patient Instructions (Signed)
 Description   INR 3.0 Continue taking 1 tablet daily, except 0.5 tablet every Wednesday.  Stay consistent with protein drink each week (1 per day)  Recheck INR in 4 weeks.   Coumadin  Clinic [phone]

## 2023-10-11 NOTE — Progress Notes (Signed)
 Description   INR 3.0 Continue taking 1 tablet daily, except 0.5 tablet every Wednesday.  Stay consistent with protein drink each week (1 per day)  Recheck INR in 4 weeks.   Coumadin  Clinic [phone]

## 2023-10-27 ENCOUNTER — Other Ambulatory Visit: Payer: Self-pay | Admitting: Surgery

## 2023-10-27 DIAGNOSIS — I6523 Occlusion and stenosis of bilateral carotid arteries: Secondary | ICD-10-CM

## 2023-10-27 DIAGNOSIS — I7143 Infrarenal abdominal aortic aneurysm, without rupture: Secondary | ICD-10-CM

## 2023-11-01 ENCOUNTER — Inpatient Hospital Stay (HOSPITAL_COMMUNITY)
Admission: EM | Admit: 2023-11-01 | Discharge: 2023-11-05 | DRG: 813 | Disposition: A | Discharge status: Rehabilitation Facility | Attending: Neurology | Admitting: Neurology

## 2023-11-01 ENCOUNTER — Encounter (HOSPITAL_COMMUNITY): Payer: Self-pay

## 2023-11-01 ENCOUNTER — Other Ambulatory Visit: Payer: Self-pay

## 2023-11-01 ENCOUNTER — Emergency Department (HOSPITAL_COMMUNITY)

## 2023-11-01 ENCOUNTER — Inpatient Hospital Stay (HOSPITAL_COMMUNITY)

## 2023-11-01 DIAGNOSIS — Z9109 Other allergy status, other than to drugs and biological substances: Secondary | ICD-10-CM

## 2023-11-01 DIAGNOSIS — E1165 Type 2 diabetes mellitus with hyperglycemia: Secondary | ICD-10-CM | POA: Diagnosis not present

## 2023-11-01 DIAGNOSIS — R29810 Facial weakness: Secondary | ICD-10-CM | POA: Diagnosis present

## 2023-11-01 DIAGNOSIS — Z860100 Personal history of colon polyps, unspecified: Secondary | ICD-10-CM | POA: Diagnosis not present

## 2023-11-01 DIAGNOSIS — I6523 Occlusion and stenosis of bilateral carotid arteries: Secondary | ICD-10-CM | POA: Diagnosis present

## 2023-11-01 DIAGNOSIS — I610 Nontraumatic intracerebral hemorrhage in hemisphere, subcortical: Principal | ICD-10-CM | POA: Diagnosis present

## 2023-11-01 DIAGNOSIS — I69128 Other speech and language deficits following nontraumatic intracerebral hemorrhage: Secondary | ICD-10-CM | POA: Diagnosis not present

## 2023-11-01 DIAGNOSIS — R29701 NIHSS score 1: Secondary | ICD-10-CM | POA: Diagnosis not present

## 2023-11-01 DIAGNOSIS — R29704 NIHSS score 4: Secondary | ICD-10-CM | POA: Diagnosis not present

## 2023-11-01 DIAGNOSIS — E1122 Type 2 diabetes mellitus with diabetic chronic kidney disease: Secondary | ICD-10-CM | POA: Diagnosis not present

## 2023-11-01 DIAGNOSIS — I10 Essential (primary) hypertension: Secondary | ICD-10-CM | POA: Diagnosis not present

## 2023-11-01 DIAGNOSIS — R260 Ataxic gait: Secondary | ICD-10-CM | POA: Diagnosis not present

## 2023-11-01 DIAGNOSIS — E1151 Type 2 diabetes mellitus with diabetic peripheral angiopathy without gangrene: Secondary | ICD-10-CM | POA: Diagnosis present

## 2023-11-01 DIAGNOSIS — N1831 Chronic kidney disease, stage 3a: Secondary | ICD-10-CM | POA: Diagnosis present

## 2023-11-01 DIAGNOSIS — J449 Chronic obstructive pulmonary disease, unspecified: Secondary | ICD-10-CM | POA: Diagnosis not present

## 2023-11-01 DIAGNOSIS — Z794 Long term (current) use of insulin: Secondary | ICD-10-CM | POA: Diagnosis not present

## 2023-11-01 DIAGNOSIS — I619 Nontraumatic intracerebral hemorrhage, unspecified: Secondary | ICD-10-CM | POA: Diagnosis present

## 2023-11-01 DIAGNOSIS — Z87891 Personal history of nicotine dependence: Secondary | ICD-10-CM | POA: Diagnosis not present

## 2023-11-01 DIAGNOSIS — T45515A Adverse effect of anticoagulants, initial encounter: Secondary | ICD-10-CM | POA: Diagnosis present

## 2023-11-01 DIAGNOSIS — Z8419 Family history of other disorders of kidney and ureter: Secondary | ICD-10-CM

## 2023-11-01 DIAGNOSIS — E1169 Type 2 diabetes mellitus with other specified complication: Secondary | ICD-10-CM | POA: Diagnosis not present

## 2023-11-01 DIAGNOSIS — I25810 Atherosclerosis of coronary artery bypass graft(s) without angina pectoris: Secondary | ICD-10-CM | POA: Diagnosis not present

## 2023-11-01 DIAGNOSIS — I613 Nontraumatic intracerebral hemorrhage in brain stem: Secondary | ICD-10-CM | POA: Diagnosis not present

## 2023-11-01 DIAGNOSIS — Z8673 Personal history of transient ischemic attack (TIA), and cerebral infarction without residual deficits: Secondary | ICD-10-CM

## 2023-11-01 DIAGNOSIS — R7989 Other specified abnormal findings of blood chemistry: Secondary | ICD-10-CM | POA: Diagnosis present

## 2023-11-01 DIAGNOSIS — R4189 Other symptoms and signs involving cognitive functions and awareness: Secondary | ICD-10-CM | POA: Diagnosis present

## 2023-11-01 DIAGNOSIS — Z79899 Other long term (current) drug therapy: Secondary | ICD-10-CM | POA: Diagnosis not present

## 2023-11-01 DIAGNOSIS — R531 Weakness: Secondary | ICD-10-CM | POA: Diagnosis not present

## 2023-11-01 DIAGNOSIS — Z634 Disappearance and death of family member: Secondary | ICD-10-CM

## 2023-11-01 DIAGNOSIS — I4891 Unspecified atrial fibrillation: Secondary | ICD-10-CM | POA: Diagnosis present

## 2023-11-01 DIAGNOSIS — I129 Hypertensive chronic kidney disease with stage 1 through stage 4 chronic kidney disease, or unspecified chronic kidney disease: Secondary | ICD-10-CM | POA: Diagnosis present

## 2023-11-01 DIAGNOSIS — G2581 Restless legs syndrome: Secondary | ICD-10-CM | POA: Diagnosis not present

## 2023-11-01 DIAGNOSIS — G8194 Hemiplegia, unspecified affecting left nondominant side: Secondary | ICD-10-CM | POA: Diagnosis present

## 2023-11-01 DIAGNOSIS — D6832 Hemorrhagic disorder due to extrinsic circulating anticoagulants: Secondary | ICD-10-CM | POA: Diagnosis not present

## 2023-11-01 DIAGNOSIS — I671 Cerebral aneurysm, nonruptured: Secondary | ICD-10-CM | POA: Diagnosis not present

## 2023-11-01 DIAGNOSIS — R270 Ataxia, unspecified: Secondary | ICD-10-CM | POA: Diagnosis present

## 2023-11-01 DIAGNOSIS — I2510 Atherosclerotic heart disease of native coronary artery without angina pectoris: Secondary | ICD-10-CM | POA: Diagnosis not present

## 2023-11-01 DIAGNOSIS — M1990 Unspecified osteoarthritis, unspecified site: Secondary | ICD-10-CM | POA: Diagnosis present

## 2023-11-01 DIAGNOSIS — Z8249 Family history of ischemic heart disease and other diseases of the circulatory system: Secondary | ICD-10-CM

## 2023-11-01 DIAGNOSIS — Z7901 Long term (current) use of anticoagulants: Secondary | ICD-10-CM | POA: Diagnosis not present

## 2023-11-01 DIAGNOSIS — I482 Chronic atrial fibrillation, unspecified: Secondary | ICD-10-CM | POA: Diagnosis not present

## 2023-11-01 DIAGNOSIS — I77810 Thoracic aortic ectasia: Secondary | ICD-10-CM | POA: Diagnosis not present

## 2023-11-01 DIAGNOSIS — R471 Dysarthria and anarthria: Secondary | ICD-10-CM | POA: Diagnosis not present

## 2023-11-01 DIAGNOSIS — I69151 Hemiplegia and hemiparesis following nontraumatic intracerebral hemorrhage affecting right dominant side: Secondary | ICD-10-CM | POA: Diagnosis not present

## 2023-11-01 DIAGNOSIS — E785 Hyperlipidemia, unspecified: Secondary | ICD-10-CM | POA: Diagnosis not present

## 2023-11-01 DIAGNOSIS — N1832 Chronic kidney disease, stage 3b: Secondary | ICD-10-CM | POA: Diagnosis not present

## 2023-11-01 DIAGNOSIS — W19XXXA Unspecified fall, initial encounter: Principal | ICD-10-CM | POA: Diagnosis present

## 2023-11-01 DIAGNOSIS — Z825 Family history of asthma and other chronic lower respiratory diseases: Secondary | ICD-10-CM | POA: Diagnosis not present

## 2023-11-01 DIAGNOSIS — R451 Restlessness and agitation: Secondary | ICD-10-CM | POA: Diagnosis not present

## 2023-11-01 DIAGNOSIS — G479 Sleep disorder, unspecified: Secondary | ICD-10-CM | POA: Diagnosis not present

## 2023-11-01 DIAGNOSIS — R29818 Other symptoms and signs involving the nervous system: Secondary | ICD-10-CM | POA: Diagnosis not present

## 2023-11-01 DIAGNOSIS — R5381 Other malaise: Secondary | ICD-10-CM | POA: Diagnosis not present

## 2023-11-01 DIAGNOSIS — I480 Paroxysmal atrial fibrillation: Secondary | ICD-10-CM

## 2023-11-01 DIAGNOSIS — I612 Nontraumatic intracerebral hemorrhage in hemisphere, unspecified: Secondary | ICD-10-CM | POA: Diagnosis not present

## 2023-11-01 DIAGNOSIS — Z7982 Long term (current) use of aspirin: Secondary | ICD-10-CM

## 2023-11-01 DIAGNOSIS — I69192 Facial weakness following nontraumatic intracerebral hemorrhage: Secondary | ICD-10-CM | POA: Diagnosis not present

## 2023-11-01 DIAGNOSIS — E781 Pure hyperglyceridemia: Secondary | ICD-10-CM | POA: Diagnosis not present

## 2023-11-01 DIAGNOSIS — N179 Acute kidney failure, unspecified: Secondary | ICD-10-CM | POA: Diagnosis not present

## 2023-11-01 HISTORY — DX: Diverticulosis of intestine, part unspecified, without perforation or abscess without bleeding: K57.90

## 2023-11-01 HISTORY — DX: Unspecified hemorrhoids: K64.9

## 2023-11-01 LAB — CBC
HCT: 45.5 % (ref 39.0–52.0)
Hemoglobin: 14.9 g/dL (ref 13.0–17.0)
MCH: 31.4 pg (ref 26.0–34.0)
MCHC: 32.7 g/dL (ref 30.0–36.0)
MCV: 96 fL (ref 80.0–100.0)
Platelets: 212 K/uL (ref 150–400)
RBC: 4.74 MIL/uL (ref 4.22–5.81)
RDW: 13 % (ref 11.5–15.5)
WBC: 4.9 K/uL (ref 4.0–10.5)
nRBC: 0 % (ref 0.0–0.2)

## 2023-11-01 LAB — COMPREHENSIVE METABOLIC PANEL WITH GFR
ALT: 24 U/L (ref 0–44)
AST: 24 U/L (ref 15–41)
Albumin: 3.4 g/dL — ABNORMAL LOW (ref 3.5–5.0)
Alkaline Phosphatase: 61 U/L (ref 38–126)
Anion gap: 11 (ref 5–15)
BUN: 18 mg/dL (ref 8–23)
CO2: 26 mmol/L (ref 22–32)
Calcium: 8.5 mg/dL — ABNORMAL LOW (ref 8.9–10.3)
Chloride: 104 mmol/L (ref 98–111)
Creatinine, Ser: 1.3 mg/dL — ABNORMAL HIGH (ref 0.61–1.24)
GFR, Estimated: 57 mL/min — ABNORMAL LOW (ref >60)
Glucose, Bld: 198 mg/dL — ABNORMAL HIGH (ref 70–99)
Potassium: 3.9 mmol/L (ref 3.5–5.1)
Sodium: 141 mmol/L (ref 135–145)
Total Bilirubin: 0.5 mg/dL (ref 0.0–1.2)
Total Protein: 6.2 g/dL — ABNORMAL LOW (ref 6.5–8.1)

## 2023-11-01 LAB — URINALYSIS, ROUTINE W REFLEX MICROSCOPIC
Bacteria, UA: NONE SEEN
Bilirubin Urine: NEGATIVE
Glucose, UA: NEGATIVE mg/dL
Hgb urine dipstick: NEGATIVE
Ketones, ur: NEGATIVE mg/dL
Leukocytes,Ua: NEGATIVE
Nitrite: NEGATIVE
Protein, ur: 100 mg/dL — AB
Specific Gravity, Urine: 1.02 (ref 1.005–1.030)
pH: 5 (ref 5.0–8.0)

## 2023-11-01 LAB — CBG MONITORING, ED
Glucose-Capillary: 179 mg/dL — ABNORMAL HIGH (ref 70–99)
Glucose-Capillary: 164 mg/dL — ABNORMAL HIGH (ref 70–99)

## 2023-11-01 LAB — PROTIME-INR
INR: 3.4 — ABNORMAL HIGH (ref 0.8–1.2)
Prothrombin Time: 36.3 s — ABNORMAL HIGH (ref 11.4–15.2)

## 2023-11-01 MED ORDER — ACETAMINOPHEN 650 MG RE SUPP: 650.0000 mg | RECTAL | Status: DC | PRN

## 2023-11-01 MED ORDER — VITAMIN K1 10 MG/ML IJ SOLN
10.0000 mg | Freq: Once | INTRAVENOUS | Status: AC
  Administered 2023-11-01: 10 mg via INTRAVENOUS
  Filled 2023-11-01: qty 1

## 2023-11-01 MED ORDER — STROKE: EARLY STAGES OF RECOVERY BOOK
Freq: Once | Status: AC
  Filled 2023-11-01: qty 1

## 2023-11-01 MED ORDER — PANTOPRAZOLE SODIUM 40 MG IV SOLR
40.0000 mg | Freq: Every day | INTRAVENOUS | Status: DC
  Administered 2023-11-02: 40 mg via INTRAVENOUS
  Filled 2023-11-01 (×2): qty 10

## 2023-11-01 MED ORDER — INSULIN ASPART 100 UNIT/ML IJ SOLN
0.0000 [IU] | Freq: Every day | INTRAMUSCULAR | Status: DC
  Administered 2023-11-04: 3 [IU] via SUBCUTANEOUS

## 2023-11-01 MED ORDER — CHLORHEXIDINE GLUCONATE CLOTH 2 % EX PADS
6.0000 | MEDICATED_PAD | Freq: Every day | CUTANEOUS | Status: DC
  Administered 2023-11-02 – 2023-11-03 (×2): 6 via TOPICAL

## 2023-11-01 MED ORDER — NICOTINE POLACRILEX 2 MG MT LOZG: 2.0000 mg | LOZENGE | OROMUCOSAL | Status: DC | PRN

## 2023-11-01 MED ORDER — INSULIN ASPART 100 UNIT/ML IJ SOLN
0.0000 [IU] | Freq: Three times a day (TID) | INTRAMUSCULAR | Status: DC
  Administered 2023-11-02 – 2023-11-04 (×4): 1 [IU] via SUBCUTANEOUS
  Administered 2023-11-05: 2 [IU] via SUBCUTANEOUS

## 2023-11-01 MED ORDER — ACETAMINOPHEN 160 MG/5ML PO SOLN: 650.0000 mg | ORAL | Status: DC | PRN

## 2023-11-01 MED ORDER — EMPTY CONTAINERS FLEXIBLE MISC
1709.0000 [IU] | Status: AC
  Administered 2023-11-01: 1709 [IU] via INTRAVENOUS
  Filled 2023-11-01: qty 1709

## 2023-11-01 MED ORDER — ACETAMINOPHEN 325 MG PO TABS: 650.0000 mg | ORAL_TABLET | ORAL | Status: DC | PRN

## 2023-11-01 MED ORDER — SENNOSIDES-DOCUSATE SODIUM 8.6-50 MG PO TABS
1.0000 | ORAL_TABLET | Freq: Two times a day (BID) | ORAL | Status: DC
  Administered 2023-11-03 – 2023-11-04 (×3): 1 via ORAL
  Filled 2023-11-01 (×7): qty 1

## 2023-11-01 NOTE — ED Triage Notes (Signed)
 BIB EMS for weakness, difficulty balancing, and had a fall yesterday around 2pm. Pt is experiencing increased weakness and difficulty balancing today. EMS noted left sided facial droop and slurred speech.

## 2023-11-01 NOTE — Progress Notes (Signed)
 eLink Physician-Brief Progress Note Patient Name: John Woodward. DOB: 1945/06/14 MRN: [id number]   Date of Service  11/01/2023  HPI/Events of Note  78 y.o. male with hx of CAD, PAD, memory loss, former smoker, HTN, HLD, afibb on coumadin  who presents with unstable gait and to have an intraparenchymal hemorrhage centered over the internal capsule.  ICH 0  On examination, the patient has normal vitals saturating 95% on room air.  Status post PCC and vitamin K.  Results consistent with mild hyper glycemia, elevated creatinine, INR 3.4 before meds.  CT and MRI imaging reviewed with multifocal chronic microhemorrhage and stable ICH.  eICU Interventions  Standard neuroprotective measures with frequent neurological checks, interval imaging, tight blood pressure control less than 150 systolic, head of bed elevation, tight glycemic control 140-180  Status post PCC and vitamin K  DVT prophylaxis with SCDs GI prophylaxis with pantoprazole      Intervention Category Evaluation Type: New Patient Evaluation  John Woodward 11/01/2023, 11:47 PM

## 2023-11-01 NOTE — Consult Note (Signed)
 NEUROLOGY H&P NOTE   Date of service: November 01, 2023 Patient Name: Garry Nicolini. MRN:  [id number] DOB:  [birth year] Chief Complaint: dizzy, off balance, fall  History of Present Illness  Keavon Sensing. is a 78 y.o. male with hx of CAD, PAD, memory loss, former smoker, HTN, HLD, afibb on coumadin  who presents with unstable gait.  He usually stays up most of the night. Went to bed at 0400 on 11/01/23 and walked over to the bed without any issues. Around 1400, wife noted that he was sleeping sideways in the bed. When he got up around 1430, he was unstable, slid next to the bed and wife had to help him back.  He was noted to have a L facial droop and slurred speech and brought in to the ED. He is on coumadin  as he is unable to afford eliquis .  CT head demonstrated a small RR BG ICH.  He took his warfarin this afternoon at 1430.  Wife at bedside reports memory issues. He does not eat vegetables.  Last known well: 0400 on 11/01/23. Modified rankin score: 0-Completely asymptomatic and back to baseline post- stroke ICH Score: 0 tNKASE: Not offered due to ICH Thrombectomy: not offered due to ICH NIHSS components Score: Comment  1a Level of Conscious 0[x]  1[]  2[]  3[]      1b LOC Questions 0[x]  1[]  2[]       1c LOC Commands 0[x]  1[]  2[]       2 Best Gaze 0[x]  1[]  2[]       3 Visual 0[x]  1[]  2[]  3[]      4 Facial Palsy 0[]  1[x]  2[]  3[]    Slight left facial droop  5a Motor Arm - left 0[x]  1[]  2[]  3[]  4[]  UN[]    5b Motor Arm - Right 0[x]  1[]  2[]  3[]  4[]  UN[]    6a Motor Leg - Left 0[x]  1[]  2[]  3[]  4[]  UN[]    6b Motor Leg - Right 0[x]  1[]  2[]  3[]  4[]  UN[]    7 Limb Ataxia 0[]  1[x]  2[]  UN[]    Ataxia in left lower extremity  8 Sensory 0[]  1[x]  2[]  UN[]    Chronically decreased in left foot secondary to prior fracture  9 Best Language 0[x]  1[]  2[]  3[]      10 Dysarthria 0[]  1[x]  2[]  UN[]    Slight slurred speech  11 Extinct. and Inattention 0[x]  1[]  2[]       TOTAL: 4      ROS  Comprehensive  ROS performed and pertinent positives documented in the HPI.  Past History   Past Medical History:  Diagnosis Date   Arthritis    Carotid artery disease    Closed fracture dislocation of right elbow    30 years ago -- fell out of theback of a truck   Diabetes mellitus without complication (HCC)    Hx of colonic polyps 02/23/2017   Hyperlipidemia    Hypertension    Diagnosed 10 years ago. No on medication. Denies history of CAD.    Memory loss    PAD (peripheral artery disease)    Past Surgical History:  Procedure Laterality Date   OLECRANON BURSECTOMY Right 08/18/2016   Procedure: OLECRANON BURSECTOMY;  Surgeon: Addie Glendia Hacker, MD;  Location: [facility] OR;  Service: Orthopedics;  Laterality: Right;   Family History  Problem Relation Age of Onset   Hypertension Mother    Dementia Mother        39s   Transient ischemic attack Mother        91s   Kidney  disease Father    COPD Father    Hypertension Brother    Lung cancer Maternal Aunt    Alcohol abuse Maternal Grandfather    Mental illness Paternal Grandmother    Hypertension Daughter    Healthy Son    Learning disabilities Son    Colon cancer Neg Hx    Colon polyps Neg Hx    Esophageal cancer Neg Hx    Rectal cancer Neg Hx    Stomach cancer Neg Hx    Social History   Socioeconomic History   Marital status: Married    Spouse name: Ginger   Number of children: 3   Years of education: Not on file   Highest education level: 12th grade  Occupational History   Occupation: Retired    Comment: Curator  Tobacco Use   Smoking status: Former    Types: Cigarettes    Start date: 06/27/2022    Quit date: 1968    Years since quitting: 57.8   Smokeless tobacco: Never   Tobacco comments:    Taking Nicotine  lozenges  Vaping Use   Vaping status: Never Used  Substance and Sexual Activity   Alcohol use: No   Drug use: No   Sexual activity: Yes  Other Topics Concern   Not on file  Social History Narrative   Social Review       Patient lives at home with. Spouse Brace Welte   Pt lives in a one or two story home? One story home   Does patient lives in a facility? If so where? Private home   What is patient highest level of education? 12 grade   Pt is RIGHT handed            Daughter died in 12-31-2021  Social Drivers of Health   Financial Resource Strain: Low Risk  (09/14/2023)   Overall Financial Resource Strain (CARDIA)    Difficulty of Paying Living Expenses: Not hard at all  Food Insecurity: No Food Insecurity (09/14/2023)   Hunger Vital Sign    Worried About Running Out of Food in the Last Year: Never true    Ran Out of Food in the Last Year: Never true  Transportation Needs: No Transportation Needs (09/14/2023)   PRAPARE - Administrator, Civil Service (Medical): No    Lack of Transportation (Non-Medical): No  Physical Activity: Inactive (09/14/2023)   Exercise Vital Sign    Days of Exercise per Week: 0 days    Minutes of Exercise per Session: 0 min  Stress: No Stress Concern Present (09/14/2023)   [employer] of Occupational Health - Occupational Stress Questionnaire    Feeling of Stress: Not at all  Social Connections: Moderately Integrated (09/14/2023)   Social Connection and Isolation Panel    Frequency of Communication with Friends and Family: More than three times a week    Frequency of Social Gatherings with Friends and Family: More than three times a week    Attends Religious Services: More than 4 times per year    Active Member of Clubs or Organizations: No    Attends Banker Meetings: Never    Marital Status: Married   Allergies  Allergen Reactions   Antibacterial Hand Soap [Triclosan] Other (See Comments)    Caused water blisters    Medications  (Not in a hospital admission)    Vitals   Vitals:   11/01/23 1751 11/01/23 1830 11/01/23 1845 11/01/23 1950  BP:  130/66 (!) 139/98 ROLLEN)  168/87  Pulse:  (!) 56 (!) 53 91  Resp:  17 16 14    Temp:      SpO2:  98% 98% 94%  Weight: 71.2 kg     Height: 5' 11 (1.803 m)        Body mass index is 21.9 kg/m.  Physical Exam   General: Laying comfortably in bed; in no acute distress.  HENT: Normal oropharynx and mucosa. Normal external appearance of ears and nose.  Neck: Supple, no pain or tenderness  CV: No JVD. No peripheral edema.  Pulmonary: Symmetric Chest rise. Normal respiratory effort.  Abdomen: Soft to touch, non-tender.  Ext: No cyanosis, edema, hammertoe deformity in left 2nd toe. Skin: No rash. Normal palpation of skin.   Musculoskeletal: Normal digits and nails by inspection. No clubbing.   Neurologic Examination  Mental status/Cognition: Alert, oriented to self, place, month but not to year. Good attention and able to do simple calculations.  Speech/language: Slightly slurred speech, slightly hypophonic, fluent, comprehension intact, object naming intact, repetition intact.  Cranial nerves:   CN II Pupils equal and reactive to light, no VF deficits    CN III,IV,VI EOM intact, no gaze preference or deviation, no nystagmus    CN V normal sensation in V1, V2, and V3 segments bilaterally    CN VII Slight left facial droop   CN VIII normal hearing to speech    CN IX & X normal palatal elevation, no uvular deviation    CN XI 5/5 head turn and 5/5 shoulder shrug bilaterally    CN XII midline tongue protrusion    Motor:  Muscle bulk: Fair, tone normal, pronator drift noted in left upper extremity.  Tremor none Mvmt Root Nerve  Muscle Right Left Comments  SA C5/6 Ax Deltoid 5 5   EF C5/6 Mc Biceps 5 5   EE C6/7/8 Rad Triceps 5 5   WF C6/7 Med FCR     WE C7/8 PIN ECU     F Ab C8/T1 U ADM/FDI 5 5   HF L1/2/3 Fem Illopsoas 5 4+   KE L2/3/4 Fem Quad 5 5   DF L4/5 D Peron Tib Ant 5 5   PF S1/2 Tibial Grc/Sol 5 5    Sensation:  Light touch Chronically decreased in left foot secondary to chronic fracture   Pin prick    Temperature    Vibration    Proprioception    Coordination/Complex Motor:  - Finger to Nose intact bilaterally - Heel to shin with ataxia and left lower extremity. - Rapid alternating movement are slowed on the left - Gait: Deferred for patient's safety. Labs   CBC:  Recent Labs  Lab 11/01/23 1800  WBC 4.9  HGB 14.9  HCT 45.5  MCV 96.0  PLT 212    Basic Metabolic Panel:  Lab Results  Component Value Date   NA 141 11/01/2023   K 3.9 11/01/2023   CO2 26 11/01/2023   GLUCOSE 198 (H) 11/01/2023   BUN 18 11/01/2023   CREATININE 1.30 (H) 11/01/2023   CALCIUM  8.5 (L) 11/01/2023   GFRNONAA 57 (L) 11/01/2023   GFRAA >60 08/18/2016   Lipid Panel:  Lab Results  Component Value Date   LDLCALC 49 05/26/2023   HgbA1c:  Lab Results  Component Value Date   HGBA1C 7.2 (H) 10/05/2023   Urine Drug Screen: No results found for: LABOPIA, COCAINSCRNUR, LABBENZ, AMPHETMU, THCU, LABBARB  Alcohol Level No results found for: [facility] INR  Lab Results  Component Value Date   INR 3.4 (H) 11/01/2023   APTT No results found for: APTT   CT Head without contrast(Personally reviewed): R internal capsule hemorrhage.  CT angio Head and Neck with contrast(Personally reviewed): Pending  MRI Brain(Personally reviewed): Pending  Impression   Damein Gaunce. is a 78 y.o. male with hx of CAD, PAD, memory loss, former smoker, HTN, HLD, afibb on coumadin  who presents with unstable gait, slight left facial droop and slurred speech.  He was found to have a 1.8 cm intraparenchymal hemorrhage centered in the right internal capsule.  His last dose of warfarin was this afternoon at 1430 and his INR is 3.4.  Primary Diagnosis:  Nontraumatic intracerebral hemorrhage in hemisphere, subcortical  Secondary Diagnosis: Paroxysmal atrial fibrillation  Recommendations  Right BG IPH with ICH score of 0: - Admit to ICU - Stability scan in 6-8 hours or STAT with any neurological decline - Frequent neuro  checks; q66min for 1 hour, then q1hour - No antiplatelets or anticoagulants due to ICH - SCD for DVT prophylaxis, pharmacological DVT ppx at 24 hours if ICH is stable - Blood pressure control with goal systolic 130 - 150, cleverplex and labetalol  PRN - Stroke labs, HgbA1c, fasting lipid panel - MRI brain with and without contrast when stabilized to evaluate for underlying mass - CTA head and neck to evaluate for underlying vascular abnormality. - Risk factor modification - Echocardiogram - PT consult, OT consult, Speech consult. - Stroke team to follow - Kcentra and Vit K.  DM2: Sliding scale insulin  with carb modified diet.  HTN: Hold home meds. Will use PRN Cleviprex/labetalol  as needed.  HLD: - continue home statin. - LDL pending.  Former smoker: - on nicotine  lozenges 2mg  TID. Will continue from home supply. ______________________________________________________________________   Plan discussed with patient, his wife, his son and daughter-in-law at the bedside.  Allergies also verified and patient is allergic to antibacterial hand soap.  CODE STATUS verified and patient is full code.  Signed,  Ayiana Winslett, MD [facility]

## 2023-11-01 NOTE — ED Notes (Addendum)
 Pt transported to MRI

## 2023-11-01 NOTE — ED Notes (Signed)
 Pt back from MRI

## 2023-11-01 NOTE — ED Provider Notes (Signed)
 [place] EMERGENCY DEPARTMENT AT [facility] Provider Note   CSN: [id number] Arrival date & time: 11/01/23  1721     Patient presents with: Weakness and Dizziness   John Prevo. is a 78 y.o. male.   Pt w hx afib on coumadin , hx pvd, hx carotid disease, presents with c/o fall yesterday afternoon around 2 pm. Pt unsure what caused fall. No faintness or dizziness or loc with fall. Denies head injury. Is on coumadin . Today, son had went to check on and pt was kind of slumped over bar height counter, c/o feel weak and dizzy/off balance today. Pt last seen/known at baseline yesterday around 1 or 130 by daughter. Pt denies new numbness/weakness, but family member noted left facial weakness today and mildly slurred speech. Pt denies headache. No neck or back pain. No chest pain or sob. No abd pain or nv. Denies extremity pain or injury.   The history is provided by the patient, medical records, a relative and the EMS personnel. The history is limited by the condition of the patient.  Weakness Associated symptoms: dizziness   Associated symptoms: no abdominal pain, no chest pain, no dysuria, no fever, no headaches, no nausea, no shortness of breath and no vomiting   Dizziness Associated symptoms: weakness   Associated symptoms: no chest pain, no headaches, no nausea, no shortness of breath and no vomiting        Prior to Admission medications   Medication Sig Start Date End Date Taking? Authorizing Provider  acetaminophen  (TYLENOL ) 500 MG tablet Take 500 mg by mouth every 6 (six) hours as needed.    [provider]  amLODipine  (NORVASC ) 10 MG tablet TAKE 1 TABLET BY MOUTH EVERY DAY 09/13/23   Tolia, Sunit, DO  aspirin  EC 81 MG tablet Take 1 tablet (81 mg total) by mouth daily. Swallow whole. 11/20/22   Patwardhan, Newman PARAS, MD  escitalopram  (LEXAPRO ) 20 MG tablet Take 1 tablet (20 mg total) by mouth daily. 10/05/23   Tabori, Katherine E, MD  losartan  (COZAAR ) 50 MG  tablet TAKE 1 TABLET BY MOUTH EVERY DAY 08/23/23   Tabori, Katherine E, MD  nicotine  polacrilex (COMMIT) 4 MG lozenge Take 1 lozenge (4 mg total) by mouth as needed for up to 90 doses for smoking cessation. 02/13/22   Tolia, Sunit, DO  rosuvastatin  (CRESTOR ) 40 MG tablet TAKE 1 TABLET BY MOUTH EVERY DAY 08/12/23   Patwardhan, Manish J, MD  warfarin (COUMADIN ) 5 MG tablet Take 1-2 tablets daily or as prescribed by coumadin  clinic 08/30/23   Patwardhan, Newman PARAS, MD  metoprolol  tartrate (LOPRESSOR ) 25 MG tablet Take 1 tablet (25 mg total) by mouth 2 (two) times daily. 02/01/20 02/16/20  Cantwell, Celeste C, PA-C    Allergies: Antibacterial hand soap [triclosan]    Review of Systems  Constitutional:  Negative for chills and fever.  HENT:  Negative for sore throat and trouble swallowing.   Eyes:  Negative for visual disturbance.  Respiratory:  Negative for shortness of breath.   Cardiovascular:  Negative for chest pain.  Gastrointestinal:  Negative for abdominal pain, nausea and vomiting.  Genitourinary:  Negative for dysuria and flank pain.  Musculoskeletal:  Negative for back pain and neck pain.  Neurological:  Positive for dizziness and weakness. Negative for headaches.    Updated Vital Signs BP 130/66   Pulse (!) 56   Temp 97.7 F (36.5 C)   Resp 17   Ht 1.803 m (5' 11)   Wt  71.2 kg   SpO2 98%   BMI 21.90 kg/m   Physical Exam Vitals and nursing note reviewed.  Constitutional:      Appearance: Normal appearance. He is well-developed.  HENT:     Head: Atraumatic.     Nose: Nose normal.     Mouth/Throat:     Mouth: Mucous membranes are moist.     Pharynx: Oropharynx is clear.  Eyes:     General: No scleral icterus.    Extraocular Movements: Extraocular movements intact.     Conjunctiva/sclera: Conjunctivae normal.     Pupils: Pupils are equal, round, and reactive to light.  Neck:     Vascular: No carotid bruit.     Trachea: No tracheal deviation.  Cardiovascular:     Rate  and Rhythm: Normal rate and regular rhythm.     Pulses: Normal pulses.     Heart sounds: Normal heart sounds. No murmur heard.    No friction rub. No gallop.  Pulmonary:     Effort: Pulmonary effort is normal. No accessory muscle usage or respiratory distress.     Breath sounds: Normal breath sounds.  Abdominal:     General: Bowel sounds are normal. There is no distension.     Palpations: Abdomen is soft.     Tenderness: There is no abdominal tenderness.  Musculoskeletal:        General: No swelling.     Cervical back: Normal range of motion and neck supple. No rigidity.     Comments: CTLS spine, non tender, aligned, no step off. Good rom bil extremities without pain or focal bony tenderness.   Skin:    General: Skin is warm and dry.     Findings: No rash.  Neurological:     Mental Status: He is alert.     Comments: Alert, speech relatively clear, ?mildly slurred. No aphasia. Left facial weakness. Motor intact bil ext stre 5/5. Sens grossly intact.   Psychiatric:        Mood and Affect: Mood normal.     (all labs ordered are listed, but only abnormal results are displayed) Results for orders placed or performed during the hospital encounter of 11/01/23  CBG monitoring, ED   Collection Time: 11/01/23  5:58 PM  Result Value Ref Range   Glucose-Capillary 179 (H) 70 - 99 mg/dL  Comprehensive metabolic panel   Collection Time: 11/01/23  6:00 PM  Result Value Ref Range   Sodium 141 135 - 145 mmol/L   Potassium 3.9 3.5 - 5.1 mmol/L   Chloride 104 98 - 111 mmol/L   CO2 26 22 - 32 mmol/L   Glucose, Bld 198 (H) 70 - 99 mg/dL   BUN 18 8 - 23 mg/dL   Creatinine, Ser 8.69 (H) 0.61 - 1.24 mg/dL   Calcium  8.5 (L) 8.9 - 10.3 mg/dL   Total Protein 6.2 (L) 6.5 - 8.1 g/dL   Albumin 3.4 (L) 3.5 - 5.0 g/dL   AST 24 15 - 41 U/L   ALT 24 0 - 44 U/L   Alkaline Phosphatase 61 38 - 126 U/L   Total Bilirubin 0.5 0.0 - 1.2 mg/dL   GFR, Estimated 57 (L) >60 mL/min   Anion gap 11 5 - 15  CBC    Collection Time: 11/01/23  6:00 PM  Result Value Ref Range   WBC 4.9 4.0 - 10.5 K/uL   RBC 4.74 4.22 - 5.81 MIL/uL   Hemoglobin 14.9 13.0 - 17.0 g/dL   HCT 45.5  39.0 - 52.0 %   MCV 96.0 80.0 - 100.0 fL   MCH 31.4 26.0 - 34.0 pg   MCHC 32.7 30.0 - 36.0 g/dL   RDW 86.9 88.4 - 84.4 %   Platelets 212 150 - 400 K/uL   nRBC 0.0 0.0 - 0.2 %  Protime-INR - (order if patient is taking Coumadin  / Warfarin)   Collection Time: 11/01/23  6:00 PM  Result Value Ref Range   Prothrombin Time 36.3 (H) 11.4 - 15.2 seconds   INR 3.4 (H) 0.8 - 1.2      EKG: EKG Interpretation Date/Time:  Monday November 01 2023 17:49:41 EDT Ventricular Rate:  53 PR Interval:  165 QRS Duration:  92 QT Interval:  459 QTC Calculation: 431 R Axis:   45  Text Interpretation: Sinus bradycardia Non-specific ST-t changes Confirmed by John Woodward ([id number]) on 11/01/2023 5:54:03 PM  Radiology: No results found.   Procedures   Medications Ordered in the ED - No data to display                                  Medical Decision Making Problems Addressed: Accidental fall, initial encounter: acute illness or injury with systemic symptoms that poses a threat to life or bodily functions Anticoagulated on Coumadin : chronic illness or injury with exacerbation, progression, or side effects of treatment that poses a threat to life or bodily functions Basal ganglia hemorrhage (HCC): acute illness or injury with systemic symptoms that poses a threat to life or bodily functions  Amount and/or Complexity of Data Reviewed Independent Historian: EMS    Details: Ems/fam, hx External Data Reviewed: notes. Labs: ordered. Decision-making details documented in ED Course. Radiology: ordered and independent interpretation performed. Decision-making details documented in ED Course. ECG/medicine tests: ordered and independent interpretation performed. Decision-making details documented in ED Course. Discussion of management or test  interpretation with external provider(s): Neurology, neurosurgery  Risk Prescription drug management. Decision regarding hospitalization.   Iv ns. Continuous pulse ox and cardiac monitoring. Labs ordered/sent. Imaging ordered.   Differential diagnosis includes cva, head injury, etc. Dispo decision including potential need for admission considered - will get labs and imaging and reassess.   Reviewed nursing notes and prior charts for additional history. External reports reviewed. Additional history from: ems, family.   Cardiac monitor: sinus rhythm, rate 55.  Labs reviewed/interpreted by me - chem w glucose elev, mild/mod, hco3 normal. Wbc and hgb normal. INR 3.4.   CT reviewed/interpreted by me - 1.8 cm basal ganglia hemorrhage.   Neurosurgery consulted - discussed briefly w Dr Gillie and showed ct imaging - he indicates would go to neurology/consult neurology, from his standpoint, no need for emergent coumadin  reversal. Discussed w neurology, Dr Vanessa - he indicates would give vitamin k dose now, he will see and admit.   Neurology consulted, discussed as above. Plan for admission.  Recheck pt, no change in exam. Fall ~ 30 hrs ago.   CRITICAL CARE RE: basal ganglia hemorrhage, intraparenchymal hemorrhage.  Performed by: Kiyomi Pallo E Shantell Belongia Total critical care time: 45 minutes Critical care time was exclusive of separately billable procedures and treating other patients. Critical care was necessary to treat or prevent imminent or life-threatening deterioration. Critical care was time spent personally by me on the following activities: development of treatment plan with patient and/or surrogate as well as nursing, discussions with consultants, evaluation of patient's response to treatment, examination of patient, obtaining history  from patient or surrogate, ordering and performing treatments and interventions, ordering and review of laboratory studies, ordering and review of radiographic  studies, pulse oximetry and re-evaluation of patient's condition.  Recheck, no change in neuro exam from initial, no headache.        Final diagnoses:  None    ED Discharge Orders     None          John Drivers, MD 11/01/23 1921

## 2023-11-02 ENCOUNTER — Inpatient Hospital Stay (HOSPITAL_COMMUNITY)

## 2023-11-02 DIAGNOSIS — E1151 Type 2 diabetes mellitus with diabetic peripheral angiopathy without gangrene: Secondary | ICD-10-CM

## 2023-11-02 DIAGNOSIS — I482 Chronic atrial fibrillation, unspecified: Secondary | ICD-10-CM

## 2023-11-02 DIAGNOSIS — R29701 NIHSS score 1: Secondary | ICD-10-CM | POA: Diagnosis not present

## 2023-11-02 DIAGNOSIS — T45515A Adverse effect of anticoagulants, initial encounter: Secondary | ICD-10-CM | POA: Diagnosis not present

## 2023-11-02 DIAGNOSIS — I10 Essential (primary) hypertension: Secondary | ICD-10-CM

## 2023-11-02 DIAGNOSIS — I610 Nontraumatic intracerebral hemorrhage in hemisphere, subcortical: Secondary | ICD-10-CM | POA: Diagnosis not present

## 2023-11-02 LAB — PROTIME-INR
INR: 1.2 (ref 0.8–1.2)
INR: 1.4 — ABNORMAL HIGH (ref 0.8–1.2)
Prothrombin Time: 16 s — ABNORMAL HIGH (ref 11.4–15.2)
Prothrombin Time: 17.6 s — ABNORMAL HIGH (ref 11.4–15.2)

## 2023-11-02 LAB — HEMOGLOBIN A1C
Hgb A1c MFr Bld: 6.4 % — ABNORMAL HIGH (ref 4.8–5.6)
Mean Plasma Glucose: 136.98 mg/dL

## 2023-11-02 LAB — GLUCOSE, CAPILLARY
Glucose-Capillary: 112 mg/dL — ABNORMAL HIGH (ref 70–99)
Glucose-Capillary: 133 mg/dL — ABNORMAL HIGH (ref 70–99)
Glucose-Capillary: 111 mg/dL — ABNORMAL HIGH (ref 70–99)
Glucose-Capillary: 161 mg/dL — ABNORMAL HIGH (ref 70–99)

## 2023-11-02 LAB — LIPID PANEL
Cholesterol: 150 mg/dL (ref 0–200)
HDL: 44 mg/dL (ref >40)
LDL Cholesterol: 52 mg/dL (ref 0–99)
Total CHOL/HDL Ratio: 3.4 ratio
Triglycerides: 272 mg/dL — ABNORMAL HIGH (ref <150)
VLDL: 54 mg/dL — ABNORMAL HIGH (ref 0–40)

## 2023-11-02 LAB — FOLATE: Folate: 18.8 ng/mL (ref >5.9)

## 2023-11-02 LAB — MRSA NEXT GEN BY PCR, NASAL: MRSA by PCR Next Gen: NOT DETECTED

## 2023-11-02 LAB — VITAMIN B12: Vitamin B-12: 572 pg/mL (ref 180–914)

## 2023-11-02 MED ORDER — ROSUVASTATIN CALCIUM 20 MG PO TABS
40.0000 mg | ORAL_TABLET | Freq: Every day | ORAL | Status: DC
  Administered 2023-11-02 – 2023-11-05 (×4): 40 mg via ORAL
  Filled 2023-11-02 (×4): qty 2

## 2023-11-02 MED ORDER — ESCITALOPRAM OXALATE 10 MG PO TABS
20.0000 mg | ORAL_TABLET | Freq: Every day | ORAL | Status: DC
  Administered 2023-11-02 – 2023-11-05 (×4): 20 mg via ORAL
  Filled 2023-11-02 (×4): qty 2

## 2023-11-02 MED ORDER — IOHEXOL 350 MG/ML SOLN
75.0000 mL | Freq: Once | INTRAVENOUS | Status: AC | PRN
  Administered 2023-11-02: 75 mL via INTRAVENOUS

## 2023-11-02 NOTE — Progress Notes (Signed)

## 2023-11-02 NOTE — Evaluation (Signed)
 Speech Language Pathology Evaluation Patient Details Name: John Woodward. MRN: [id number] DOB: [birth year] Today's Date: 11/02/2023 Time: 8863-8847 SLP Time Calculation (min) (ACUTE ONLY): 16 min  Problem List:  Patient Active Problem List   Diagnosis Date Noted   ICH (intracerebral hemorrhage) (HCC) 11/01/2023   Sleep disturbance 10/10/2023   Atrial fibrillation (HCC) 08/30/2023   Murmur 05/25/2023   PAD (peripheral artery disease) 11/20/2022   Bilateral carotid artery stenosis 11/20/2022   Pneumonia due to COVID-19 virus 06/29/2022   Transient hypotension 06/29/2022   Chronic anticoagulation 06/29/2022   Acute kidney injury superimposed on chronic kidney disease 06/29/2022   Hypokalemia 06/29/2022   History of CVA (cerebrovascular accident) 06/29/2022   Depression, recurrent 01/08/2022   Well controlled diabetes mellitus (HCC) 06/10/2021   Memory deficit 06/10/2021   Paroxysmal atrial fibrillation (HCC) 12/03/2019   Pulmonary emphysema (HCC) 09/22/2018   Hyperlipidemia associated with type 2 diabetes mellitus (HCC) 10/02/2016   Annual physical exam 09/16/2016   Prostate cancer screening 09/16/2016   Tobacco abuse disorder 08/11/2016   Essential hypertension 08/11/2016   Past Medical History:  Past Medical History:  Diagnosis Date   Arthritis    Carotid artery disease    Closed fracture dislocation of right elbow    30 years ago -- fell out of theback of a truck   Diabetes mellitus without complication (HCC)    Hx of colonic polyps 02/23/2017   Hyperlipidemia    Hypertension    Diagnosed 10 years ago. No on medication. Denies history of CAD.    Memory loss    PAD (peripheral artery disease)    Past Surgical History:  Past Surgical History:  Procedure Laterality Date   OLECRANON BURSECTOMY Right 08/18/2016   Procedure: OLECRANON BURSECTOMY;  Surgeon: Addie Glendia Hacker, MD;  Location: [facility] OR;  Service: Orthopedics;  Laterality: Right;   HPI:  78 y.o. male  presents to [facility] from home on 11/01/2023 with gait instability. CT head with findings of small R basal ganglia ICH. PMH includes CAD, PAD, memory loss, HTN, HLD, afib. Per his wife's report, baseline short-term memory is poor and pt has really slowed down in the last year, sleeping more during the day with wakefulness at night.   Assessment / Plan / Recommendation Clinical Impression  Pt presents with acute on chronic cognitive deficits s/p new R BG stroke.  Function is c/b impaired attention, awareness, and working memory.  Speech is mildly dysarthric with occasional distortion of consonants during running speech.  Expressive and receptive language are WNL. Agree with recs for post-acute therapy for >three hours therapy/day to include speech tx.    SLP Assessment  SLP Recommendation/Assessment: Patient needs continued Speech Language Pathology Services SLP Visit Diagnosis: Dysarthria and anarthria (R47.1);Cognitive communication deficit (R41.841)     Assistance Recommended at Discharge  Frequent or constant Supervision/Assistance  Functional Status Assessment Patient has had a recent decline in their functional status and demonstrates the ability to make significant improvements in function in a reasonable and predictable amount of time.  Frequency and Duration min 2x/week  2 weeks      SLP Evaluation Cognition  Overall Cognitive Status: Impaired/Different from baseline Orientation Level: Oriented to person;Oriented to place;Oriented to situation;Disoriented to time (Did not know the year) Attention: Sustained;Selective Sustained Attention: Appears intact Selective Attention: Impaired Selective Attention Impairment: Verbal basic Memory: Impaired Memory Impairment: Storage deficit;Decreased recall of new information Awareness: Impaired Awareness Impairment: Intellectual impairment Safety/Judgment: Impaired       Comprehension  Auditory Comprehension  Overall Auditory Comprehension:  Appears within functional limits for tasks assessed Commands: Within Functional Limits Visual Recognition/Discrimination Discrimination: Within Function Limits Reading Comprehension Reading Status: Within funtional limits    Expression Expression Primary Mode of Expression: Verbal Verbal Expression Overall Verbal Expression: Appears within functional limits for tasks assessed Written Expression Dominant Hand: Right Written Expression: Not tested   Oral / Motor  Oral Motor/Sensory Function Overall Oral Motor/Sensory Function: Mild impairment Facial ROM: Reduced left;Suspected CN VII (facial) dysfunction Facial Symmetry: Abnormal symmetry left;Suspected CN VII (facial) dysfunction Facial Strength: Reduced left;Other (Comment) Motor Speech Overall Motor Speech: Impaired Respiration: Within functional limits Phonation: Normal Resonance: Within functional limits Articulation: Impaired Level of Impairment: Conversation Intelligibility: Intelligibility reduced Conversation: 75-100% accurate Motor Speech Errors: Not applicable            Vona Palma Laurice 11/02/2023, 1:11 PM Rohil Lesch L. Vona, MA CCC/SLP Clinical Specialist - Acute Care SLP Acute Rehabilitation Services Office number [phone]

## 2023-11-02 NOTE — Progress Notes (Addendum)
 STROKE TEAM PROGRESS NOTE    SIGNIFICANT HOSPITAL EVENTS: 10/6 - 78 y.o. male admitted with c/o fall yesterday afternoon around 2 PM. At the time of fall, patient denied faintness, dizziness, head injury or LOC, however began to feel dizzy and off balance the following day. He also had some L facial weakness and mildly slurred speech.   CT Head showed 1.8 cm, right intraparenchymal hemorrhage centered in the posterior limb of the internal capsule.   MRI showed right basal ganglia intraparenchymal hemorrhage, and multiple chronic hemorrhages in the pons, cerebellum, thalami, and supratentorial white matter.  INTERIM HISTORY/SUBJECTIVE: Patient seen at bedside. Reports he was was laying down in bed at home, attempted to get up and had unsteady gait, which caused him to slide down next to bed. At that time, he also had dysarthria and L facial droop. He shows several bruises on his left arm from the fall and mentions he takes Warfarin at home.  His INR was 3.2 it was reversed with Kcentra.  Last INR is down to 1.2.  His blood pressure is adequately controlled.  OBJECTIVE:  CBC    Component Value Date/Time   WBC 4.9 11/01/2023 1800   RBC 4.74 11/01/2023 1800   HGB 14.9 11/01/2023 1800   HGB 14.3 11/10/2022 1305   HCT 45.5 11/01/2023 1800   HCT 43.4 11/10/2022 1305   PLT 212 11/01/2023 1800   MCV 96.0 11/01/2023 1800   MCH 31.4 11/01/2023 1800   MCHC 32.7 11/01/2023 1800   RDW 13.0 11/01/2023 1800   LYMPHSABS 1.1 05/26/2023 1114   MONOABS 0.3 05/26/2023 1114   EOSABS 0.3 05/26/2023 1114   BASOSABS 0.1 05/26/2023 1114    BMET    Component Value Date/Time   NA 141 11/01/2023 1800   NA 143 11/18/2022 1022   K 3.9 11/01/2023 1800   CL 104 11/01/2023 1800   CO2 26 11/01/2023 1800   GLUCOSE 198 (H) 11/01/2023 1800   BUN 18 11/01/2023 1800   BUN 19 11/18/2022 1022   CREATININE 1.30 (H) 11/01/2023 1800   CREATININE 1.16 02/02/2020 1109   CALCIUM  8.5 (L) 11/01/2023 1800   EGFR 53  (L) 11/18/2022 1022   GFRNONAA 57 (L) 11/01/2023 1800    IMAGING past 24 hours CT ANGIO HEAD NECK W WO CM Result Date: 11/02/2023 EXAM: CTA HEAD AND NECK WITH AND WITHOUT 11/02/2023 04:32:40 AM TECHNIQUE: CTA of the head and neck was performed with and without the administration of 75 mL of iohexol  (OMNIPAQUE ) 350 MG/ML injection. Multiplanar 2D and/or 3D reformatted images are provided for review. Automated exposure control, iterative reconstruction, and/or weight based adjustment of the mA/kV was utilized to reduce the radiation dose to as low as reasonably achievable. Stenosis of the internal carotid arteries measured using NASCET criteria. COMPARISON: Brain MRI yesterday, head CT yesterday, and earlier. CLINICAL HISTORY: 78 year old male with acute neuro deficit, stroke suspected. FINDINGS: CTA NECK: AORTIC ARCH AND ARCH VESSELS: Partially visible advanced aortic arch atherosclerosis. Arch is not completely visible, probable 3 vessel arch configuration. Proximal subclavian artery atherosclerosis without significant stenosis. No dissection or arterial injury. CERVICAL CAROTID ARTERIES: Mild right CCA atherosclerosis without stenosis. Bulky soft and calcified plaque at the right ICA origin results in narrowing with 80 percent stenosis. Mild right ICA atherosclerosis below the skull base. Patent left CCA with atherosclerosis but no significant stenosis. Bulky soft and calcified plaque at the left ICA origin with 76 percent stenosis. Left ICA remains patent with minor calcified plaque below the skull  base. No dissection or arterial injury. CERVICAL VERTEBRAL ARTERIES: Right vertebral artery is patent, mildly dominant, and mildly tortuous to the skull base with mild plaque and no stenosis. Left vertebral artery origin soft and calcified plaque resulting in mild stenosis. Nondominant left vertebral artery is patent with mild additional plaque to the skull base. No dissection or arterial injury. LUNGS AND  MEDIASTINUM: Centrilobular Emphysema and apical lung scarring in the visible upper chest. SOFT TISSUES: Nonvascular neck soft tissue spaces are within normal limits. BONES: Largely absent dentition. Advanced chronic cervical spine degeneration with degenerative appearing interbody ankylosis at C4-C5, bulky disc and endplate degeneration elsewhere including vacuum disc at C5-C6. No acute osseous abnormality. CTA HEAD: ANTERIOR CIRCULATION: Both ICA siphons are patent. Left siphon moderate calcified plaque, right siphon similar moderate calcified plaque. Both ICA siphons also are tortuous; only mild siphon stenosis results. There is a distal left ICA 4 x 2 mm triangular aneurysm vs infundibulum of a diminutive left posterior communicating artery. Patent carotid terminus. Ectatic anterior communicating artery and anteriorly directed 3 mm anterior communicating artery aneurysm on series 5 image 95 and series 9 image 89. Tortuous bilateral ACA branches are otherwise within normal limits. Left MCA M1 segment is patent but with mild to moderate distal M1 irregularity and stenosis on series 11 image 46. Left MCA trifurcation, is ectatic as seen on series 10 image 21. No discrete saccular aneurysm there. Contralateral right MCA M1 segment is patent with mild ectasia. Right MCA bifurcation is patent without stenosis. Bilateral MCA branches are patent without stenosis. POSTERIOR CIRCULATION: Distal vertebral arteries and vertebrobasilar junction are patent without stenosis. Both PICA origins are patent. Right vertebral V4 segment is dominant with mild to moderate calcified plaque. Patent basilar artery without stenosis. Patent SCA and PCA origins. Diminutive or absent posterior communicating arteries. Left PCA branches are patent with up to moderate distal P2 segment irregularity and stenosis on series 12 image 22. Right PCA branches are patent with mild to moderate distal P2 segment irregularity on series 12 image 16. OTHER:  Major dural venous sinuses appear grossly patent with early intracranial venous contrast timing. No CTA spot sign of the right deep gray matter hyperdense hemorrhage; no abnormal vascularity there. IMPRESSION: 1. Positive for advanced atherosclerosis and evidence of small intracranial aneurysms (see #3). Negative for large vessel occlusion or CTA spot sign associated with small acute intra-axial hemorrhage 2. High grade Bilateral ICA origin stenosis: 80 percent stenosis on the right, 76 percent stenosis on the left. 3. 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 Electronically signed by: Helayne Hurst MD 11/02/2023 05:56 AM EDT RP Workstation: [id number]   MR BRAIN WO CONTRAST Result Date: 11/01/2023 EXAM: MRI BRAIN WITHOUT CONTRAST 11/01/2023 10:08:51 PM TECHNIQUE: Multiplanar multisequence MRI of the head/brain was performed without the administration of intravenous contrast. COMPARISON: 12/04/2019 and 11/01/2023. CLINICAL HISTORY: Neuro deficit, acute, stroke suspected. FINDINGS: BRAIN AND VENTRICLES: No acute infarct. Intraparenchymal hemorrhage centered at the right basal ganglia, unchanged in size. Multiple chronic micro hemorrhages in the pons and cerebellum, as well as within the thalami and supratentorial white matter. Early confluent hyperintense T2-weighted signal within the cerebral white matter, most commonly due to chronic small vessel disease. No mass. No midline shift. No hydrocephalus. The sella is unremarkable. Normal flow voids. ORBITS: No acute abnormality. SINUSES AND MASTOIDS: No acute abnormality. BONES AND SOFT TISSUES: Normal marrow signal. No acute soft tissue abnormality. IMPRESSION: 1. Unchanged right basal  ganglia intraparenchymal hemorrhage. 2. Multiple chronic microhemorrhages in the pons, cerebellum, thalami, and supratentorial white matter. 3. Early confluent chronic small vessel disease. Electronically  signed by: Franky Stanford MD 11/01/2023 10:18 PM EDT RP Workstation: [id number]   CT Head Wo Contrast Result Date: 11/01/2023 CLINICAL DATA:  Neuro deficit, acute, stroke suspected EXAM: CT HEAD WITHOUT CONTRAST TECHNIQUE: Contiguous axial images were obtained from the base of the skull through the vertex without intravenous contrast. RADIATION DOSE REDUCTION: This exam was performed according to the departmental dose-optimization program which includes automated exposure control, adjustment of the mA and/or kV according to patient size and/or use of iterative reconstruction technique. COMPARISON:  MR head 12/04/2019 FINDINGS: Brain: No evidence of large-territorial acute infarction. Acute, at least 1.8 cm, right intraparenchymal hemorrhage centered in the posterior limb of the internal capsule. No mass lesion. No extra-axial collection. No mass effect or midline shift. No hydrocephalus. Basilar cisterns are patent. Vascular: No hyperdense vessel. Ca Atherosclerotic calcifications are present within the cavernous internal carotid and vertebral arteries. Skull: No acute fracture or focal lesion. Sinuses/Orbits: Paranasal sinuses and mastoid air cells are clear. The orbits are unremarkable. Other: None. IMPRESSION: Acute, at least 1.8 cm, right intraparenchymal hemorrhage centered in the posterior limb of the internal capsule. These results were called by telephone at the time of interpretation on 11/01/2023 at 7:07 pm to provider KEVIN STEINL , who verbally acknowledged these results. Electronically Signed   By: Morgane  Naveau M.D.   On: 11/01/2023 19:12    Vitals:   11/02/23 0905 11/02/23 1005 11/02/23 1100 11/02/23 1200  BP: (!) 133/90 134/83 131/74 (!) 151/70  Pulse: (!) 55 72 (!) 57 (!) 56  Resp: 18 17 18 11   Temp:      TempSrc:      SpO2: 96% 93% 95% 97%  Weight:      Height:        PHYSICAL EXAM: General:  Alert, well-nourished, well-developed patient in no acute distress Psych:  Mood and  affect appropriate for situation CV: Regular rate and rhythm on monitor Respiratory:  Regular, unlabored respirations on room air  NEURO:  Mental Status: AA&Ox3, patient is able to give clear and coherent history Speech/Language: speech is without dysarthria or aphasia. Naming, repetition, fluency, and comprehension intact.  Cranial Nerves:  II: PERRL. Visual fields full.  III, IV, VI: EOMI. Eyelids elevate symmetrically.  V: normal sensation in V1, V2, and V3 segments bilaterally  VII: Face is symmetrical resting and smiling VIII: hearing intact to voice. IX, X: Palate elevates symmetrically. Phonation is normal.  XI: Shoulder shrug 5/5. XII: tongue is midline without fasciculations. Motor: Strength - RUE 4/5; RLE 4/5; LUE 5/5; LLE 5/5. R hand grip strength < L hand Tone: is normal and bulk is normal Sensation - Diminished sensation in R foot, however patient reports this is from a past surgery. Otherwise, intact to light touch bilaterally. Extinction absent to light touch to DSS.   Coordination: FTN intact bilaterally, HKS: no ataxia in BLE.No drift.  Gait- deferred  Most Recent NIH: 1   ASSESSMENT/PLAN:  [name]. is a 78 y.o. male with history of HTN, HLD, Afib on Coumadin , CAD, PAD, memory loss, and former tobacco use presenting with unstable gait, found to have right basal ganglia intraparenchymal hemorrhage. NIH on Admission 4.  On assessment today, patient with residual right-sided arm and leg weakness. He is okay to ambulate with PT. Stable from neurological perspective to transfer out of ICU after 24  hours. Most likely etiology of hemorrhage is chronic uncontrolled hypertension.  Intraparenchymal Hemorrhage: right basal ganglia Etiology:  Hypertensive and warfarin related in the patient with A-fib on anticoagulation Code Stroke CT head: Acute, at least 1.8 cm, right intraparenchymal hemorrhage centered in the posterior limb of the internal capsule. CTA head  & neck:  Positive for advanced atherosclerosis and evidence of small intracranial aneurysms (see #3). Negative for large vessel occlusion or CTA spot sign associated with small acute intra-axial hemorrhage. High grade Bilateral ICA origin stenosis: 80 percent stenosis on the right, 76 percent stenosis on the left. 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 MRI: Unchanged right basal ganglia intraparenchymal hemorrhage. Multiple chronic microhemorrhages in the pons, cerebellum, thalami, and supratentorial white matter. Early confluent chronic small vessel disease.  2D Echo in 06/2023 - EF 60-65%, normal MV, normal LA size, no LVH LDL 52 HgbA1c 6.4 VTE prophylaxis - SCD's Patient on Warfarin daily prior to admission, now holding antithrombotics d/t hemorrhagic stroke Therapy recommendations:  CIR Disposition:  Pending  Hx of Stroke/TIA Hx of chronic lacunar infarcts dating back to 2020 as seen on MRI  Atrial fibrillation Home Meds: hold home Warfarin Continue telemetry monitoring  Hypertension Home meds: Amlodipine  10 mg daily Stable Blood Pressure Goal: SBP between 130-150 for 24 hours and then less than 160   Hyperlipidemia Home meds:  Rosuvastatin  40 mg daily, resumed in hospital LDL 52, goal < 70 Continue statin at discharge  Diabetes type II Controlled Home meds:  None HgbA1c 6.4, goal < 7.0 CBGs SSI Recommend close follow-up with PCP for better DM control  Other Stroke Risk Factors Coronary artery disease   Hospital day # 1  I have personally obtained history,examined this patient, reviewed notes, independently viewed imaging studies, participated in medical decision making and plan of care.ROS completed by me personally and pertinent positives fully documented  I have made any additions or clarifications directly to the above note. Agree with note above.  Patient presented with dysarthria and  left hemiparesis due to small right basal ganglia hemorrhage likely related to warfarin anticoagulation for chronic A-fib and possibly an uncontrolled hypertension.  Recommend close neurological monitoring and strict blood pressure control with systolic goal between 130-150 for the first 24 hours and then below 160.  Repeat CT head tomorrow morning.  Mobilize out of bed.  Physical occupational and speech therapy consults.  We are between a rock and a hard place as patient needs anticoagulation for his A-fib to prevent thromboembolism and strokes and given his intracerebral hemorrhage as well as cerebral microhemorrhages on MRI he is not a good long-term anticoagulation candidate.  Discussed treatment options with the patient and his wife including switching to Eliquis  which she cannot afford, considering Watchman device or participating in ASPIRE stroke prevention study(Eliquis  versus aspirin  in patients with intracerebral hemorrhage and A-fib) . This patient is critically ill and at significant risk of neurological worsening, death and care requires constant monitoring of vital signs, hemodynamics,respiratory and cardiac monitoring, extensive review of multiple databases, frequent neurological assessment, discussion with family, other specialists and medical decision making of high complexity.I have made any additions or clarifications directly to the above note.This critical care time does not reflect procedure time, or teaching time or supervisory time of PA/NP/Med Resident etc but could involve care discussion time.  I spent 30 minutes of neurocritical care time  in the care of  this patient.  Eather Popp, MD Medical Director [facility] Stroke Center Pager: [phone] 11/02/2023 4:33 PM   To contact Stroke Continuity provider, please refer to [email or web address]. After hours, contact General Neurology

## 2023-11-02 NOTE — TOC CM/SW Note (Signed)
 Transition of Care [facility]) - Inpatient Brief Assessment   Patient Details  Name: John Woodward. MRN: [id number] Date of Birth: 01-Jul-1945  Transition of Care [facility]) CM/SW Contact:    Josepha Mliss HERO, RN Phone Number: 11/02/2023, 3:55 PM   Clinical Narrative: 78 y.o. male presents to [facility] hospital on 11/01/2023 with gait instability. CT head with findings of small R basal ganglia ICH.  PT/OT recommending CIR and consult requested; Inpatient Care Manager will continue to follow as patient progresses.    Transition of Care Asessment: Insurance and Status: Insurance coverage has been reviewed Patient has primary care physician: Yes Sharlett Greet) Home environment has been reviewed: lives with spouse Prior level of function:: Independent Prior/Current Home Services: No current home services Social Drivers of Health Review: SDOH reviewed no interventions necessary Readmission risk has been reviewed: Yes Transition of care needs: no transition of care needs at this time  Mliss MICAEL Josepha, RN, BSN  Trauma/Neuro ICU Case Manager [phone]

## 2023-11-02 NOTE — Progress Notes (Signed)
  Inpatient Rehabilitation Admissions Coordinator   Met with patient at bedside for rehab assessment. We discussed goals and expectations of a possible CIR admit. He prefers CIR for rehab. Family can provide expected caregiver support that is recommended. I will request Rehab MD to see him tomorrow to assist with obtaining Auth with [facility]. Please call me with any questions.   Heron Leavell, RN, MSN Rehab Admissions Coordinator [phone]

## 2023-11-02 NOTE — Evaluation (Signed)
 Physical Therapy Evaluation Patient Details Name: John Woodward. MRN: [id number] DOB: March 21, 1945 Today's Date: 11/02/2023  History of Present Illness  78 y.o. male presents to [facility] hospital on 11/01/2023 with gait instability. CT head with findings of small R basal ganglia ICH. PMH includes CAD, PAD, memory loss, HTN, HLD, afib.  Clinical Impression  Pt presents to PT with deficits in gait, balance, coordination. Pt with a tendency for leftward stagger and multiple losses of balance. Pt is aware of leftward drift and staggering but is unable to consistently prevent this from occurring. PT also notes LUE coordination deficits and pt reports chronic LLE sensation deficits. Pt is very high functioning at baseline and enjoys working on old cars. Pt demonstrates the potential to return to independence and his prior level of function with high intensity inpatient therapies. Patient will benefit from intensive inpatient follow-up therapy, >3 hours/day.        If plan is discharge home, recommend the following: A little help with walking and/or transfers;A little help with bathing/dressing/bathroom;Assistance with cooking/housework;Assist for transportation;Help with stairs or ramp for entrance   Can travel by private vehicle        Equipment Recommendations BSC/3in1  Recommendations for Other Services  Rehab consult    Functional Status Assessment Patient has had a recent decline in their functional status and demonstrates the ability to make significant improvements in function in a reasonable and predictable amount of time.     Precautions / Restrictions Precautions Precautions: Fall Recall of Precautions/Restrictions: Intact Restrictions Weight Bearing Restrictions Per Provider Order: No      Mobility  Bed Mobility Overal bed mobility: Needs Assistance Bed Mobility: Supine to Sit     Supine to sit: Supervision          Transfers Overall transfer level: Needs  assistance Equipment used: None Transfers: Sit to/from Stand Sit to Stand: Contact guard assist                Ambulation/Gait Ambulation/Gait assistance: Mod assist Gait Distance (Feet): 300 Feet Assistive device: None Gait Pattern/deviations: Step-through pattern, Staggering left Gait velocity: reduced Gait velocity interpretation: 1.31 - 2.62 ft/sec, indicative of limited community ambulator   General Gait Details: pt with multiple leftward losses of balance, often drifting to left side and correcting with stepping strategy but continuing to drift left and eventually needing physical assistance to correct  Stairs Stairs: Yes Stairs assistance: Min assist Stair Management: One rail Right, Step to pattern, Forwards Number of Stairs: 5    Wheelchair Mobility     Tilt Bed    Modified Rankin (Stroke Patients Only) Modified Rankin (Stroke Patients Only) Pre-Morbid Rankin Score: No symptoms Modified Rankin: Moderately severe disability     Balance Overall balance assessment: Needs assistance Sitting-balance support: No upper extremity supported, Feet supported Sitting balance-Leahy Scale: Poor Sitting balance - Comments: posterior lean with dynamic sitting tasks Postural control: Posterior lean Standing balance support: No upper extremity supported, During functional activity Standing balance-Leahy Scale: Poor                               Pertinent Vitals/Pain Pain Assessment Pain Assessment: No/denies pain    Home Living Family/patient expects to be discharged to:: Private residence Living Arrangements: Spouse/significant other;Children Available Help at Discharge: Family;Available 24 hours/day Type of Home: House Home Access: Stairs to enter Entrance Stairs-Rails: Doctor, general practice of Steps: 5   Home Layout: One level Home Equipment:  Rolling Walker (2 wheels);Cane - single point;Shower seat      Prior Function Prior Level  of Function : Independent/Modified Independent;Driving             Mobility Comments: ambulatory without DME, enjoys working on old cars       Extremity/Trunk Assessment   Upper Extremity Assessment Upper Extremity Assessment: LUE deficits/detail LUE Deficits / Details: ROM and strength WFL LUE Coordination: decreased fine motor    Lower Extremity Assessment Lower Extremity Assessment: LLE deficits/detail LLE Deficits / Details: pt with old L ankle fx, reports chronic sensation deficits at foot and shin LLE Sensation: decreased light touch    Cervical / Trunk Assessment Cervical / Trunk Assessment: Normal  Communication   Communication Communication: No apparent difficulties    Cognition Arousal: Alert Behavior During Therapy: WFL for tasks assessed/performed   PT - Cognitive impairments: History of cognitive impairments (memory deficits per chart, no significant deficits noted during this assessment)                         Following commands: Intact       Cueing Cueing Techniques: Verbal cues     General Comments General comments (skin integrity, edema, etc.): VSS on RA    Exercises     Assessment/Plan    PT Assessment Patient needs continued PT services  PT Problem List Decreased activity tolerance;Decreased balance;Decreased mobility;Decreased knowledge of use of DME;Decreased knowledge of precautions;Impaired sensation       PT Treatment Interventions DME instruction;Gait training;Stair training;Functional mobility training;Therapeutic activities;Therapeutic exercise;Balance training;Neuromuscular re-education;Cognitive remediation;Patient/family education    PT Goals (Current goals can be found in the Care Plan section)  Acute Rehab PT Goals Patient Stated Goal: to return to independence PT Goal Formulation: With patient Time For Goal Achievement: 11/16/23 Potential to Achieve Goals: Good Additional Goals Additional Goal #1: Pt will  score >19/24 on the DGI to indicate a reduced risk for falls Additional Goal #2: Pt will score >45/56 on the BERG to indicate a reduced risk for falls    Frequency Min 3X/week     Co-evaluation               AM-PAC PT 6 Clicks Mobility  Outcome Measure Help needed turning from your back to your side while in a flat bed without using bedrails?: A Little Help needed moving from lying on your back to sitting on the side of a flat bed without using bedrails?: A Little Help needed moving to and from a bed to a chair (including a wheelchair)?: A Little Help needed standing up from a chair using your arms (e.g., wheelchair or bedside chair)?: A Little Help needed to walk in hospital room?: A Lot Help needed climbing 3-5 steps with a railing? : A Little 6 Click Score: 17    End of Session Equipment Utilized During Treatment: Gait belt Activity Tolerance: Patient tolerated treatment well Patient left: in chair;with call bell/phone within reach;with chair alarm set Nurse Communication: Mobility status PT Visit Diagnosis: Other abnormalities of gait and mobility (R26.89);Other symptoms and signs involving the nervous system (R29.898)    Time: 9079-9052 PT Time Calculation (min) (ACUTE ONLY): 27 min   Charges:   PT Evaluation $PT Eval Low Complexity: 1 Low   PT General Charges $$ ACUTE PT VISIT: 1 Visit         Bernardino JINNY Ruth, PT, DPT Acute Rehabilitation Office [phone]   Bernardino JINNY Ruth 11/02/2023, 10:03 AM

## 2023-11-03 ENCOUNTER — Inpatient Hospital Stay (HOSPITAL_COMMUNITY)

## 2023-11-03 DIAGNOSIS — I25810 Atherosclerosis of coronary artery bypass graft(s) without angina pectoris: Secondary | ICD-10-CM | POA: Diagnosis not present

## 2023-11-03 DIAGNOSIS — I482 Chronic atrial fibrillation, unspecified: Secondary | ICD-10-CM | POA: Diagnosis not present

## 2023-11-03 DIAGNOSIS — E785 Hyperlipidemia, unspecified: Secondary | ICD-10-CM

## 2023-11-03 DIAGNOSIS — Z794 Long term (current) use of insulin: Secondary | ICD-10-CM

## 2023-11-03 DIAGNOSIS — I10 Essential (primary) hypertension: Secondary | ICD-10-CM | POA: Diagnosis not present

## 2023-11-03 DIAGNOSIS — R29701 NIHSS score 1: Secondary | ICD-10-CM | POA: Diagnosis not present

## 2023-11-03 DIAGNOSIS — E1169 Type 2 diabetes mellitus with other specified complication: Secondary | ICD-10-CM

## 2023-11-03 DIAGNOSIS — I610 Nontraumatic intracerebral hemorrhage in hemisphere, subcortical: Secondary | ICD-10-CM | POA: Diagnosis not present

## 2023-11-03 DIAGNOSIS — T45515A Adverse effect of anticoagulants, initial encounter: Secondary | ICD-10-CM | POA: Diagnosis not present

## 2023-11-03 LAB — PROTIME-INR
INR: 1.1 (ref 0.8–1.2)
Prothrombin Time: 14.7 s (ref 11.4–15.2)

## 2023-11-03 LAB — GLUCOSE, CAPILLARY
Glucose-Capillary: 105 mg/dL — ABNORMAL HIGH (ref 70–99)
Glucose-Capillary: 133 mg/dL — ABNORMAL HIGH (ref 70–99)
Glucose-Capillary: 112 mg/dL — ABNORMAL HIGH (ref 70–99)
Glucose-Capillary: 151 mg/dL — ABNORMAL HIGH (ref 70–99)

## 2023-11-03 MED ORDER — ENOXAPARIN SODIUM 40 MG/0.4ML IJ SOSY
40.0000 mg | PREFILLED_SYRINGE | INTRAMUSCULAR | Status: DC
Start: 2023-11-03 — End: 2023-11-05
  Administered 2023-11-03 – 2023-11-05 (×3): 40 mg via SUBCUTANEOUS
  Filled 2023-11-03 (×3): qty 0.4

## 2023-11-03 NOTE — PMR Pre-admission (Signed)
 PMR Admission Coordinator Pre-Admission Assessment  Patient: John Woodward. is an 78 y.o., male MRN: [id number] DOB: [birth year] Height: 5' 11 (180.3 cm) Weight: 71.2 kg  Insurance Information HMO: HMO/PPO:      PCP:      IPA:      80/20:      OTHER:  PRIMARY: [employer] Medicare HMO/PPO      Policy#: [id number]      Subscriber: pt CM Name: Annabella Baars      Phone#: [phone]     Fax#: [phone] Pre-Cert#: [id number]  Auth for CIR from Tiffany with [employer] for admit 11/04/23 with next review date 10/15.  Updates due to Tiffany at fax listed above.        Employer:  Benefits:  Phone #: [phone]     Name: 10/8 Eff. Date: 01/27/23     Deduct: none      Out of Pocket Max: $5900      Life Max: none CIR: $300 co pay per day days 1 until 6      SNF: $10 co pay per day days 1 until 20; $214 co pay per day days 21 until 100 Outpatient: $30 per visit     Co-Pay:  Home Health: 100%      Co-Pay:  DME: 80%     Co-Pay: 20% Providers: in network  SECONDARY: none      Policy#:      Phone#:   Artist:       Phone#:   The Data processing manager" for patients in Inpatient Rehabilitation Facilities with attached "Privacy Act Statement-Health Care Records" was provided and verbally reviewed with: Patient and Family  Emergency Contact Information Contact Information     Name Relation Home Work Mobile   [place]  Spouse [phone]      Other Contacts     Name Relation Home Work Mobile   Keene,Brooke Daughter   [phone]      Current Medical History  Patient Admitting Diagnosis: ICH  History of Present Illness: 78 yo male with history of afib on coumadin , PVD, carotid disease,DM type 2,  memory loss, former smoker, HTN, HLD,who presented on 11/01/23 to [facility] with a fall the day before and with unstable gait.  Left facial weakness and mildly slurred speech and dizziness.   CT head showed 1.8 cm  right intraparenchymal hemorrhage centered in the posterior limb of the internal capsule. MRI showed right basal ganglia intraparenchymal hemorrhage, and multiple chronic hemorrhages in the pons, cerebellum, thalami and supratentorial white matter. Etiology felt likely due to HTN and [place] related as patient with afib on anticoagulation. CTA head an neck positive for advanced atherosclerosis and evidence of small intracranial aneurysms (see #3). Negative for large vessel occlusion or CTA spot sign associated with small acute intra-axial hemorrhage. High grade Bilateral ICA origin stenosis: 80 percent stenosis on the right, 76 percent stenosis on the left. 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 Now holding all antithrombotics due to hemorrhagic stroked.   Home meds amlodipine . Stable BP> LDL 52 on home med of Rosuvastatin . Hgb A1c 6.4 on no meds at home. CBGS, SSI and recommend close follow up with PCP for better DM control.   Complete NIHSS TOTAL: 2  [facility] has been reviewed by the rehabilitation admission coordinator and physician.  Past Medical History  Past Medical History:  Diagnosis Date  Arthritis    Carotid artery disease    Closed fracture dislocation of right elbow    30 years ago -- fell out of theback of a truck   Diabetes mellitus without complication (HCC)    Hx of colonic polyps 02/23/2017   Hyperlipidemia    Hypertension    Diagnosed 10 years ago. No on medication. Denies history of CAD.    Memory loss    PAD (peripheral artery disease)    Has the patient had major surgery during 100 days prior to admission? No  Family History   family history includes Alcohol abuse in his maternal grandfather; COPD in his father; Dementia in his mother; Healthy in his son; Hypertension in his brother, daughter, and mother; Kidney disease in his father; Learning  disabilities in his son; Lung cancer in his maternal aunt; Mental illness in his paternal grandmother; Transient ischemic attack in his mother.  Current Medications  Current Facility-Administered Medications:    acetaminophen  (TYLENOL ) tablet 650 mg, 650 mg, Oral, Q4H PRN **OR** acetaminophen  (TYLENOL ) 160 MG/5ML solution 650 mg, 650 mg, Per Tube, Q4H PRN **OR** acetaminophen  (TYLENOL ) suppository 650 mg, 650 mg, Rectal, Q4H PRN, Khaliqdina, Salman, MD   Chlorhexidine Gluconate Cloth 2 % PADS 6 each, 6 each, Topical, Daily, Khaliqdina, Salman, MD, 6 each at 11/03/23 0944   enoxaparin  (LOVENOX ) injection 40 mg, 40 mg, Subcutaneous, Q24H, Sethi, Pramod S, MD, 40 mg at 11/03/23 1016   escitalopram  (LEXAPRO ) tablet 20 mg, 20 mg, Oral, Daily, Sethi, Pramod S, MD, 20 mg at 11/03/23 9057   insulin  aspart (novoLOG ) injection 0-5 Units, 0-5 Units, Subcutaneous, QHS, Khaliqdina, Salman, MD   insulin  aspart (novoLOG ) injection 0-9 Units, 0-9 Units, Subcutaneous, TID WC, Khaliqdina, Salman, MD, 1 Units at 11/03/23 1149   nicotine  polacrilex (COMMIT) lozenge 2 mg, 2 mg, Oral, PRN, Khaliqdina, Salman, MD   rosuvastatin  (CRESTOR ) tablet 40 mg, 40 mg, Oral, Daily, Sethi, Pramod S, MD, 40 mg at 11/03/23 9057   senna-docusate (Senokot-S) tablet 1 tablet, 1 tablet, Oral, BID, Khaliqdina, Salman, MD  Patients Current Diet:  Diet Order             Diet Carb Modified Fluid consistency: Thin; Room service appropriate? Yes with Assist  Diet effective now                  Precautions / Restrictions Precautions Precautions: Fall Restrictions Weight Bearing Restrictions Per Provider Order: No   Has the patient had 2 or more falls or a fall with injury in the past year? No  Prior Activity Level Community (5-7x/wk): independent and driving with no AD  Prior Functional Level Self Care: Did the patient need help bathing, dressing, using the toilet or eating? Independent  Indoor Mobility: Did the patient  need assistance with walking from room to room (with or without device)? Independent  Stairs: Did the patient need assistance with internal or external stairs (with or without device)? Independent  Functional Cognition: Did the patient need help planning regular tasks such as shopping or remembering to take medications? Independent  Patient Information Are you of Hispanic, Latino/a,or Spanish origin?: A. No, not of Hispanic, Latino/a, or Spanish origin What is your race?: A. White Do you need or want an interpreter to communicate with a doctor or health care staff?: 0. No  Patient's Response To:  Health Literacy and Transportation Is the patient able to respond to health literacy and transportation needs?: Yes Health Literacy - How often do you need to have  someone help you when you read instructions, pamphlets, or other written material from your doctor or pharmacy?: Never In the past 12 months, has lack of transportation kept you from medical appointments or from getting medications?: No In the past 12 months, has lack of transportation kept you from meetings, work, or from getting things needed for daily living?: No  Home Assistive Devices / Equipment Home Equipment: Agricultural consultant (2 wheels), [employer] - single point, Personnel officer Use: Indicate devices/aids used by the patient prior to current illness, exacerbation or injury? None of the above  Current Functional Level Cognition  Overall Cognitive Status: Impaired/Different from baseline Orientation Level: Oriented X4 Attention: Sustained, Selective Sustained Attention: Appears intact Selective Attention: Impaired Selective Attention Impairment: Verbal basic Memory: Impaired Memory Impairment: Storage deficit, Decreased recall of new information Awareness: Impaired Awareness Impairment: Intellectual impairment Safety/Judgment: Impaired    Extremity Assessment (includes Sensation/Coordination)  Upper Extremity  Assessment: Right hand dominant, LUE deficits/detail LUE Deficits / Details: ROM and strength WFL, reports decreased sensation. movements are bigger than they need to be due to decreased sensation and proprioception LUE Sensation: decreased proprioception, decreased light touch LUE Coordination: decreased fine motor  Lower Extremity Assessment: Defer to PT evaluation LLE Deficits / Details: pt with old L ankle fx, reports chronic sensation deficits at foot and shin LLE Sensation: decreased light touch    ADLs  Overall ADL's : Needs assistance/impaired Eating/Feeding: Supervision/ safety Grooming: Oral care, Wash/dry face, Contact guard assist, Standing Grooming Details (indicate cue type and reason): LOB x1 to the posterior left without awareness Upper Body Bathing: Supervision/ safety, Sitting Upper Body Bathing Details (indicate cue type and reason): unsafe from standing position Lower Body Bathing: Contact guard assist, Sitting/lateral leans Lower Body Bathing Details (indicate cue type and reason): unsafe from standing position Upper Body Dressing : Sitting, Minimal assistance Upper Body Dressing Details (indicate cue type and reason): extra time required, decreased propioception of LUE Lower Body Dressing: Minimal assistance, Sit to/from stand Lower Body Dressing Details (indicate cue type and reason): able to perform figure 4 for Bil socks. extra time and effort for LLE. Pt required assist for balance and transfer for sit<>stand dressing Toilet Transfer: Minimal assistance, Ambulation, Rolling walker (2 wheels) Toileting- Clothing Manipulation and Hygiene: Minimal assistance, Sit to/from stand Functional mobility during ADLs: Minimal assistance, Cueing for safety, Rolling walker (2 wheels) General ADL Comments: decreased sensation and proprioception of L side    Mobility  Overal bed mobility: Needs Assistance Bed Mobility: Supine to Sit Supine to sit: Supervision    Transfers   Overall transfer level: Needs assistance Equipment used: Rolling walker (2 wheels) Transfers: Sit to/from Stand Sit to Stand: Contact guard assist General transfer comment: watch initial balance for safety and LOB to the L posterior    Ambulation / Gait / Stairs / Wheelchair Mobility  Ambulation/Gait Ambulation/Gait assistance: Mod assist Gait Distance (Feet): 300 Feet Assistive device: None Gait Pattern/deviations: Step-through pattern, Staggering left General Gait Details: pt with multiple leftward losses of balance, often drifting to left side and correcting with stepping strategy but continuing to drift left and eventually needing physical assistance to correct Gait velocity: reduced Gait velocity interpretation: 1.31 - 2.62 ft/sec, indicative of limited community ambulator Stairs: Yes Stairs assistance: Min assist Stair Management: One rail Right, Step to pattern, Forwards Number of Stairs: 5    Posture / Balance Dynamic Sitting Balance Sitting balance - Comments: posterior lean with dynamic sitting tasks Balance Overall balance assessment: Needs assistance Sitting-balance  support: No upper extremity supported, Feet supported Sitting balance-Leahy Scale: Poor Sitting balance - Comments: posterior lean with dynamic sitting tasks Postural control: Posterior lean Standing balance support: No upper extremity supported, During functional activity Standing balance-Leahy Scale: Poor    Special considerations/life events  Hgb A1c 6.4   Previous Home Environment  Living Arrangements: Spouse/significant other, Children  Lives With: Spouse Available Help at Discharge: Family, Available 24 hours/day Type of Home: House Home Layout: One level Home Access: Stairs to enter Entrance Stairs-Rails: Right, Left Entrance Stairs-Number of Steps: 5 Bathroom Shower/Tub: Psychologist, counselling, Engineer, manufacturing systems: Standard Bathroom Accessibility: Yes How Accessible: Accessible via  walker Home Care Services: No  Discharge Living Setting Plans for Discharge Living Setting: Patient's home, House, Lives with (comment) (wife) Type of Home at Discharge: House Discharge Home Layout: One level Discharge Home Access: Stairs to enter Entrance Stairs-Rails: Right, Left, Can reach both Entrance Stairs-Number of Steps: 5 Discharge Bathroom Shower/Tub: Walk-in shower, Tub/shower unit Discharge Bathroom Toilet: Standard Discharge Bathroom Accessibility: Yes How Accessible: Accessible via walker Does the patient have any problems obtaining your medications?: No  Social/Family/Support Systems Patient Roles: Spouse, Parent Contact Information: wife Anticipated Caregiver: wife Anticipated Caregiver's Contact Information: see contacts Ability/Limitations of Caregiver: no limitations stated Caregiver Availability: 24/7 Discharge Plan Discussed with Primary Caregiver: Yes Is Caregiver In Agreement with Plan?: Yes Does Caregiver/Family have Issues with Lodging/Transportation while Pt is in Rehab?: No  Goals Patient/Family Goal for Rehab: supervision PT, supervision to min OT, supervision SLP Expected length of stay: ELOS 10 to 12 days Additional Information: Patient has classic racing cars, used to drag race as a hobby Pt/Family Agrees to Admission and willing to participate: Yes Program Orientation Provided & Reviewed with Pt/Caregiver Including Roles  & Responsibilities: Yes  Decrease burden of Care through IP rehab admission: n/a  Possible need for SNF placement upon discharge: not anticipated  Patient Condition: I have reviewed medical records from [facility], spoken with patient and spouse. I met with patient at the bedside for inpatient rehabilitation assessment.  Patient will benefit from ongoing PT, OT, and SLP, can actively participate in 3 hours of therapy a day 5 days of the week, and can make measurable gains during the admission.  Patient will also benefit  from the coordinated team approach during an Inpatient Acute Rehabilitation admission.  The patient will receive intensive therapy as well as Rehabilitation physician, nursing, social worker, and care management interventions.  Due to bladder management, bowel management, safety, skin/wound care, disease management, medication administration, pain management, and patient education the patient requires 24 hour a day rehabilitation nursing.  The patient is currently *** with mobility and basic ADLs.  Discharge setting and therapy post discharge at home with home health is anticipated.  Patient has agreed to participate in the Acute Inpatient Rehabilitation Program and will admit today.  Preadmission Screen Completed By:  Alison Heron Lot RN MSN 11/03/2023 1:00 PM ______________________________________________________________________   Discussed status with Dr. PIERRETTE on *** at *** and received approval for admission today.  Admission Coordinator:  Alison Heron Lot, RN MSN, time PIERRETTEPattricia ***   Assessment/Plan: Diagnosis: *** Does the need for close, 24 hr/day Medical supervision in concert with the patient's rehab needs make it unreasonable for this patient to be served in a less intensive setting? {yes_no_potentially:3041433} Co-Morbidities requiring supervision/potential complications: *** Due to {due un:6958565}, does the patient require 24 hr/day rehab nursing? {yes_no_potentially:3041433} Does the patient require coordinated care of a physician, rehab nurse, PT,  OT, and SLP to address physical and functional deficits in the context of the above medical diagnosis(es)? {yes_no_potentially:3041433} Addressing deficits in the following areas: {deficits:3041436} Can the patient actively participate in an intensive therapy program of at least 3 hrs of therapy 5 days a week? {yes_no_potentially:3041433} The potential for patient to make measurable gains while on inpatient rehab is  {potential:3041437} Anticipated functional outcomes upon discharge from inpatient rehab: {functional outcomes:304600100} PT, {functional outcomes:304600100} OT, {functional outcomes:304600100} SLP Estimated rehab length of stay to reach the above functional goals is: *** Anticipated discharge destination: {anticipated dc setting:21604} 10. Overall Rehab/Functional Prognosis: {potential:3041437}   MD Signature: ***

## 2023-11-03 NOTE — Progress Notes (Addendum)
   Inpatient Rehabilitation Admissions Coordinator   I met with patient and his wife at bedside. Discussed goals and expectations of a possible CIR admit. They are in agreement. I await Rehab MD consultation to begin insurance Auth for possible admit. I have begun Auth.  Heron Leavell, RN, MSN Rehab Admissions Coordinator [phone] 11/03/2023 12:19 PM

## 2023-11-03 NOTE — Progress Notes (Signed)
 Ok for SBP to be less than 160 since it is outside the first 24 hours per neurology, Dr. Aron.  Neuro exam remains unchanged.

## 2023-11-03 NOTE — Progress Notes (Addendum)
 STROKE TEAM PROGRESS NOTE    SIGNIFICANT HOSPITAL EVENTS: 10/6 - 78 y.o. male admitted with c/o fall yesterday afternoon around 2 PM. At the time of fall, patient denied faintness, dizziness, head injury or LOC, however began to feel dizzy and off balance the following day. He also had some L facial weakness and mildly slurred speech.   CT Head showed 1.8 cm, right intraparenchymal hemorrhage centered in the posterior limb of the internal capsule.    MRI showed right basal ganglia intraparenchymal hemorrhage, and multiple chronic hemorrhages in the pons, cerebellum, thalami, and supratentorial white matter.   INTERIM HISTORY/SUBJECTIVE: Patient seen at bedside and reports he is doing well today. His INR is stable at 1.1. His blood pressure is adequately controlled. He is awaiting CIR admission.  He and his family are interested in considering ASPIRE trial(Eliquis  versus aspirin  following intracerebral hemorrhage on anticoagulation and A-fib patient's).   OBJECTIVE:  CBC    Component Value Date/Time   WBC 4.9 11/01/2023 1800   RBC 4.74 11/01/2023 1800   HGB 14.9 11/01/2023 1800   HGB 14.3 11/10/2022 1305   HCT 45.5 11/01/2023 1800   HCT 43.4 11/10/2022 1305   PLT 212 11/01/2023 1800   MCV 96.0 11/01/2023 1800   MCH 31.4 11/01/2023 1800   MCHC 32.7 11/01/2023 1800   RDW 13.0 11/01/2023 1800   LYMPHSABS 1.1 05/26/2023 1114   MONOABS 0.3 05/26/2023 1114   EOSABS 0.3 05/26/2023 1114   BASOSABS 0.1 05/26/2023 1114    BMET    Component Value Date/Time   NA 141 11/01/2023 1800   NA 143 11/18/2022 1022   K 3.9 11/01/2023 1800   CL 104 11/01/2023 1800   CO2 26 11/01/2023 1800   GLUCOSE 198 (H) 11/01/2023 1800   BUN 18 11/01/2023 1800   BUN 19 11/18/2022 1022   CREATININE 1.30 (H) 11/01/2023 1800   CREATININE 1.16 02/02/2020 1109   CALCIUM  8.5 (L) 11/01/2023 1800   EGFR 53 (L) 11/18/2022 1022   GFRNONAA 57 (L) 11/01/2023 1800    IMAGING past 24 hours CT HEAD WO CONTRAST  ( ) Result Date: 11/03/2023 EXAM: CT HEAD WITHOUT CONTRAST 11/03/2023 06:26:37 AM TECHNIQUE: CT of the head was performed without the administration of intravenous contrast. Automated exposure control, iterative reconstruction, and/or weight based adjustment of the mA/kV was utilized to reduce the radiation dose to as low as reasonably achievable. COMPARISON: Head CT and brain MRI dated 11/01/2023. CLINICAL HISTORY: 78 year old male with stroke, follow up. Right thalamic/posterior limb internal capsule hemorrhage 2 days ago. FINDINGS: BRAIN AND VENTRICLES: Oval hyperdense hemorrhage at the anterior and lateral right thalamus, likely involving some of the posterior limb right internal capsule, now measures 11 x 15 x 16 mm. Estimated volume: 1.4 mL. This has not significantly changed since presentation. No intraventricular or extra axial extension of blood. No significant regional mass effect. Elsewhere stable gray white matter differentiation with advanced chronic small vessel disease changes in the deep white matter and deep gray matter nuclei. No evidence of acute infarct. No hydrocephalus. No midline shift. ORBITS: No acute abnormality. SINUSES: No acute abnormality. SOFT TISSUES AND SKULL: No acute soft tissue abnormality. No skull fracture. IMPRESSION: 1. Stable right thalamic/posterior limb internal capsule hemorrhage (estimated volume 1.4 mL), without significant regional mass effect or intraventricular/extra-axial extension. 2. Underlying Advanced chronic small vessel disease. Electronically signed by: Helayne Hurst MD 11/03/2023 06:44 AM EDT RP Workstation: [id number]    Vitals:   11/03/23 0800 11/03/23 0900 11/03/23 1030 11/03/23  1200  BP: (!) 149/69 (!) 148/77 (!) 121/99 108/72  Pulse: (!) 49 (!) 51 60 62  Resp: 15 18 18    Temp: 97.9 F (36.6 C)  98.1 F (36.7 C)   TempSrc: Oral  Oral   SpO2: 96% 96% 95%   Weight:      Height:         PHYSICAL EXAM: General:  Alert, well-nourished,  well-developed patient in no acute distress Psych:  Mood and affect appropriate for situation CV: Regular rate and rhythm on monitor Respiratory: Regular, unlabored respirations on room air   NEURO:  Mental Status: AA&Ox3, patient is able to give clear and coherent history Speech/Language: Speech is without dysarthria or aphasia. Naming, repetition, fluency, and comprehension intact.  Cranial Nerves:  II: PERRL. Visual fields full.  III, IV, VI: EOMI. Eyelids elevate symmetrically.  V: Sensation is intact to light touch and symmetrical to face.  VII: Face is symmetrical resting and smiling VIII: hearing intact to voice. IX, X: Palate elevates symmetrically. Phonation is normal.  KP:Dynloizm shrug 5/5. XII: tongue is midline without fasciculations. Motor: Strength - RUE 4/5; RLE 4/5; LUE 5/5; LLE 5/5. Tone: is normal and bulk is normal Sensation- Diminished sensation in R foot, however patient reports this is from a past surgery. Otherwise, intact to light touch bilaterally. Extinction absent to light touch to DSS.   Coordination: FTN intact bilaterally, HKS: no ataxia in BLE. No drift.  Gait- deferred  Most Recent NIH: 1   ASSESSMENT/PLAN:  Mr. Arvie Bartholomew. is a 78 y.o. male with history of HTN, HLD, Afib on Coumadin , CAD, PAD, memory loss, and former tobacco use presenting with unstable gait, found to have right basal ganglia intraparenchymal hemorrhage likely related to warfarin anticoagulation for chronic A-fib and possibly an uncontrolled hypertension. NIH on Admission 4.   On assessment today, patient with residual right-sided arm and leg weakness, but improving. Repeat CT head this morning showed stable R thalamic / posterior limb hemorrhage without significant mass effect or interventricular/extra-axial extension. He was transferred out of the ICU to progressive today. He is awaiting CIR admission.   Intraparenchymal Hemorrhage: right basal ganglia Etiology:   Hypertensive and warfarin related as patient with A-fib on anticoagulation Code Stroke CT head: Acute, at least 1.8 cm, right intraparenchymal hemorrhage centered in the posterior limb of the internal capsule. Repeat CT head (10/8): Stable right thalamic/posterior limb internal capsule hemorrhage (estimated volume 1.4 mL), without significant regional mass effect or intraventricular/extra-axial extension. CTA head & neck:  Positive for advanced atherosclerosis and evidence of small intracranial aneurysms (see #3). Negative for large vessel occlusion or CTA spot sign associated with small acute intra-axial hemorrhage. High grade Bilateral ICA origin stenosis: 80 percent stenosis on the right, 76 percent stenosis on the left. 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 MRI: Unchanged right basal ganglia intraparenchymal hemorrhage. Multiple chronic microhemorrhages in the pons, cerebellum, thalami, and supratentorial white matter. Early confluent chronic small vessel disease.  2D Echo in 06/2023 - EF 60-65%, normal MV, normal LA size, no LVH LDL 52 HgbA1c 6.4 VTE prophylaxis - SCD's Patient on Warfarin daily prior to admission, now holding antithrombotics d/t hemorrhagic stroke Therapy recommendations: CIR Disposition: Pending   Hx of Stroke/TIA Hx of chronic lacunar infarcts dating back to 2020 as seen on MRI   Atrial fibrillation Home Meds: hold home Warfarin Continue telemetry monitoring   Hypertension Home meds: Amlodipine  10  mg daily Stable Blood Pressure Goal: SBP between 130-150 for 24 hours and then less than 160    Hyperlipidemia Home meds:  Rosuvastatin  40 mg daily, resumed in hospital LDL 52, goal < 70 Continue statin at discharge   Diabetes type II Controlled Home meds: None HgbA1c 6.4, goal < 7.0 CBGs SSI Recommend close follow-up with PCP for better DM control   Other Stroke Risk  Factors Coronary artery disease   Hospital day # 2  I have personally obtained history,examined this patient, reviewed notes, independently viewed imaging studies, participated in medical decision making and plan of care.ROS completed by me personally and pertinent positives fully documented  I have made any additions or clarifications directly to the above note. Agree with note above.  Patient remains neurologically stable and making good improvement.  Transfer out of ICU to neurology floor bed.  Last medical hospitalist team to resume care.  Physical occupational and rehab consults.  Patient and family considering possible participation in ASPIRE trial.   I personally spent a total of 50 minutes in the care of the patient today including getting/reviewing separately obtained history, performing a medically appropriate exam/evaluation, counseling and educating, placing orders, referring and communicating with other health care professionals, documenting clinical information in the EHR, independently interpreting results, and coordinating care.        Eather Popp, MD Medical Director [facility] Pager: [phone] 11/03/2023 4:34 PM   To contact Stroke Continuity provider, please refer to [email or web address]. After hours, contact General Neurology

## 2023-11-03 NOTE — Evaluation (Signed)
 Occupational Therapy Evaluation Patient Details Name: John Woodward. MRN: [id number] DOB: 08-30-1945 Today's Date: 11/03/2023   History of Present Illness   78 y.o. male presents to [facility] hospital on 11/01/2023 with gait instability. CT head with findings of small R basal ganglia ICH. PMH includes CAD, PAD, memory loss, HTN, HLD, afib.     Clinical Impressions Pt is typically independent in ADL and mobility. 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 He likes to work on his own '76 cobra. Pt today is very pleasant, motivated and demonstrates deficits in balance, coordination (L), sensation (LUE), cognition, and strength. Today he is mod A for LB ADL (although he can perform figure 4 with extra time for LB access), min A to set up for UB ADL, he utilized a RW for balance and was min A for transfers and mobility in hallway today with LOB x2 to left posterior. During seated conversation he slowly leaned more and more posterior with no awareness. LUE movements are bigger (almost ataxic) due to decreased proprioception and decreased sensation. Pt will benefit from continued skilled OT in the acute setting as well as afterwards at intensive multidisciplinary rehab of >3 hours daily to maximize safety and independence in ADL and transfers.     If plan is discharge home, recommend the following:   A little help with walking and/or transfers;A little help with bathing/dressing/bathroom;Assist for transportation;Help with stairs or ramp for entrance     Functional Status Assessment   Patient has had a recent decline in their functional status and demonstrates the ability to make significant improvements in function in a reasonable and predictable amount of time.     Equipment Recommendations   Other (comment) (defer to next venue of care)     Recommendations for Other Services   Rehab consult;PT consult;Speech consult     Precautions/Restrictions    Precautions Precautions: Fall Recall of Precautions/Restrictions: Intact Restrictions Weight Bearing Restrictions Per Provider Order: No     Mobility Bed Mobility Overal bed mobility: Needs Assistance Bed Mobility: Supine to Sit     Supine to sit: Supervision          Transfers Overall transfer level: Needs assistance Equipment used: Rolling walker (2 wheels) Transfers: Sit to/from Stand Sit to Stand: Contact guard assist           General transfer comment: watch initial balance for safety and LOB to the L posterior      Balance Overall balance assessment: Needs assistance Sitting-balance support: No upper extremity supported, Feet supported Sitting balance-Leahy Scale: Poor Sitting balance - Comments: posterior lean with dynamic sitting tasks Postural control: Posterior lean Standing balance support: No upper extremity supported, During functional activity Standing balance-Leahy Scale: Poor                             ADL either performed or assessed with clinical judgement   ADL Overall ADL's : Needs assistance/impaired Eating/Feeding: Supervision/ safety   Grooming: Oral care;Wash/dry face;Contact guard assist;Standing Grooming Details (indicate cue type and reason): LOB x1 to the posterior left without awareness Upper Body Bathing: Supervision/ safety;Sitting Upper Body Bathing Details (indicate cue type and reason): unsafe from standing position Lower Body Bathing: Contact guard assist;Sitting/lateral leans Lower Body Bathing Details (indicate cue type and reason): unsafe from standing position Upper Body Dressing : Sitting;Minimal assistance Upper Body Dressing Details (indicate cue type and reason): extra time required, decreased  propioception of LUE Lower Body Dressing: Minimal assistance;Sit to/from stand Lower Body Dressing Details (indicate cue type and reason): able to perform figure 4 for Bil socks. extra time and effort for LLE. Pt  required assist for balance and transfer for sit<>stand dressing Toilet Transfer: Minimal assistance;Ambulation;Rolling walker (2 wheels)   Toileting- Clothing Manipulation and Hygiene: Minimal assistance;Sit to/from stand       Functional mobility during ADLs: Minimal assistance;Cueing for safety;Rolling walker (2 wheels) General ADL Comments: decreased sensation and proprioception of L side     Vision Baseline Vision/History: 1 Wears glasses Ability to See in Adequate Light: 0 Adequate Patient Visual Report: No change from baseline       Perception Perception: Impaired Preception Impairment Details: Spatial orientation Perception-Other Comments: placement of LUE for functional tasks   Praxis         Pertinent Vitals/Pain Pain Assessment Pain Assessment: No/denies pain     Extremity/Trunk Assessment Upper Extremity Assessment Upper Extremity Assessment: Right hand dominant;LUE deficits/detail LUE Deficits / Details: ROM and strength WFL, reports decreased sensation. movements are bigger than they need to be due to decreased sensation and proprioception LUE Sensation: decreased proprioception;decreased light touch LUE Coordination: decreased fine motor   Lower Extremity Assessment Lower Extremity Assessment: Defer to PT evaluation   Cervical / Trunk Assessment Cervical / Trunk Assessment: Normal   Communication Communication Communication: No apparent difficulties   Cognition Arousal: Alert Behavior During Therapy: WFL for tasks assessed/performed Cognition: Cognition impaired     Awareness: Intellectual awareness intact, Online awareness impaired Memory impairment (select all impairments): Short-term memory   Executive functioning impairment (select all impairments): Problem solving, Reasoning OT - Cognition Comments: Pt pleasant and cooperative throughout session. Pt with decreased awareness of deficits and decreased problem solving for compensatory strategies.  very funny, likes humor - but  also suspect he uses it as a decoy to try and cover up deficits                 Following commands: Intact       Cueing  General Comments   Cueing Techniques: Verbal cues;Tactile cues  VSS on RA   Exercises     Shoulder Instructions      Home Living Family/patient expects to be discharged to:: Private residence Living Arrangements: Spouse/significant other;Children Available Help at Discharge: Family;Available 24 hours/day Type of Home: House Home Access: Stairs to enter [employer] of Steps: 5 Entrance Stairs-Rails: Right;Left Home Layout: One level     Bathroom Shower/Tub: Walk-in shower;Tub/shower unit   Bathroom Toilet: Standard     Home Equipment: Agricultural consultant (2 wheels);Cane - single point;Shower seat      Lives With: Spouse    Prior Functioning/Environment Prior Level of Function : Independent/Modified Independent;Driving             Mobility Comments: ambulatory without DME, enjoys working on old cars ADLs Comments: independent, wife does most of the cooking    OT Problem List: Impaired balance (sitting and/or standing);Decreased coordination;Decreased cognition;Decreased safety awareness;Decreased knowledge of use of DME or AE;Impaired sensation;Impaired UE functional use   OT Treatment/Interventions: Self-care/ADL training;Therapeutic exercise;Neuromuscular education;DME and/or AE instruction;Therapeutic activities;Patient/family education;Balance training      OT Goals(Current goals can be found in the care plan section)   Acute Rehab OT Goals Patient Stated Goal: get back to tinkering on cars OT Goal Formulation: With patient Time For Goal Achievement: 11/17/23 Potential to Achieve Goals: Good ADL Goals Pt Will Perform Grooming: with modified independence;standing Pt Will Perform  Upper Body Dressing: with modified independence;sitting Pt Will Perform Lower Body Dressing: with modified  independence;sit to/from stand Pt Will Transfer to Toilet: with modified independence;ambulating Pt Will Perform Toileting - Clothing Manipulation and hygiene: with modified independence;sit to/from stand Pt/caregiver will Perform Home Exercise Program: Left upper extremity;With written HEP provided Additional ADL Goal #1: Pt will demonstrate fine motor coordination exercises at independent level for LUE   OT Frequency:  Min 2X/week    Co-evaluation              AM-PAC OT 6 Clicks Daily Activity     Outcome Measure Help from another person eating meals?: None Help from another person taking care of personal grooming?: A Little Help from another person toileting, which includes using toliet, bedpan, or urinal?: A Little Help from another person bathing (including washing, rinsing, drying)?: A Little Help from another person to put on and taking off regular upper body clothing?: A Little Help from another person to put on and taking off regular lower body clothing?: A Little 6 Click Score: 19   End of Session Equipment Utilized During Treatment: Gait belt;Rolling walker (2 wheels) Nurse Communication: Mobility status;Precautions  Activity Tolerance: Patient tolerated treatment well Patient left: in chair;with chair alarm set;with call bell/phone within reach;with nursing/sitter in room  OT Visit Diagnosis: Unsteadiness on feet (R26.81);Other abnormalities of gait and mobility (R26.89);Ataxia, unspecified (R27.0);Other symptoms and signs involving the nervous system (R29.898)                Time: 8985-8953 OT Time Calculation (min): 32 min Charges:  OT General Charges $OT Visit: 1 Visit OT Evaluation $OT Eval Moderate Complexity: 1 Mod OT Treatments $Self Care/Home Management : 8-22 mins  Leita DEL OTR/L Acute Rehabilitation Services Office: [phone]  Leita PARAS [facility] 11/03/2023, 12:30 PM

## 2023-11-03 NOTE — Consult Note (Addendum)
 Physical Medicine and Rehabilitation Consult Reason for Consult: Rehab Referring Physician: Dr. Rosemarie    HPI: John Woodward. is a 78 y.o. male with past medical history of diabetes mellitus, arthritis, CAD, hypertension, PAD, A-fib on Coumadin  who presented to the hospital after he was noted to have weakness, dizziness and balance issues. He also had left facial droop and slurred speech. On the day prior to admission patient had a fall but not noted to have head injury.  CT head 10/6 showed right intraparenchymal hemorrhage centered in the posterior limb of the internal capsule.  INR 3.2 reversed with Kcentra. Hemorrhage felt to be hypertensive and warfarin related.  MRI of the brain showed unchanged right basal ganglia intraparenchymal hemorrhage and multiple chronic microhemorrhages in pons, cerebellum, thalami and supratentorial white matter.  CT of the brain positive for advanced atherosclerosis and evidence of small intracranial aneurysms, high-grade bilateral ICA origin stenosis, distal left ICA saccular aneurysm versus infundibulum and anterior communicating artery aneurysm.  Patient was seen by SLP for cognitive deficits and speech.  Patient seen by PT requiring mod assist for gait.  Acute inpatient rehabilitation recommended.  Per chart review patient lives in a home with 5 steps to enter.  Home is 1 level and family can assist 24 hours a day after discharge.  Patient lives with the spouse.  Home: Home Living Family/patient expects to be discharged to:: Private residence Living Arrangements: Spouse/significant other, Children Available Help at Discharge: Family, Available 24 hours/day Type of Home: House Home Access: Stairs to enter [employer] of Steps: 5 Entrance Stairs-Rails: Right, Left Home Layout: One level Bathroom Shower/Tub: Psychologist, counselling, Engineer, manufacturing systems: Standard Home Equipment: Agricultural consultant (2 wheels), [employer] - single point, Psychiatric nurse  Lives With: Spouse  Functional History: Prior Function Prior Level of Function : Independent/Modified Independent, Driving Mobility Comments: ambulatory without DME, enjoys working on old cars Functional Status:  Mobility: Bed Mobility Overal bed mobility: Needs Assistance Bed Mobility: Supine to Sit Supine to sit: Supervision Transfers Overall transfer level: Needs assistance Equipment used: None Transfers: Sit to/from Stand Sit to Stand: Contact guard assist Ambulation/Gait Ambulation/Gait assistance: Mod assist Gait Distance (Feet): 300 Feet Assistive device: None Gait Pattern/deviations: Step-through pattern, Staggering left General Gait Details: pt with multiple leftward losses of balance, often drifting to left side and correcting with stepping strategy but continuing to drift left and eventually needing physical assistance to correct Gait velocity: reduced Gait velocity interpretation: 1.31 - 2.62 ft/sec, indicative of limited community ambulator Stairs: Yes Stairs assistance: Min assist Stair Management: One rail Right, Step to pattern, Forwards Number of Stairs: 5    ADL:    Cognition: Cognition Overall Cognitive Status: Impaired/Different from baseline Orientation Level: Oriented X4 Attention: Sustained, Selective Sustained Attention: Appears intact Selective Attention: Impaired Selective Attention Impairment: Verbal basic Memory: Impaired Memory Impairment: Storage deficit, Decreased recall of new information Awareness: Impaired Awareness Impairment: Intellectual impairment Safety/Judgment: Impaired Cognition Arousal: Alert Behavior During Therapy: WFL for tasks assessed/performed Overall Cognitive Status: Impaired/Different from baseline   Review of Systems  Constitutional:  Negative for chills and fever.  HENT:  Negative for hearing loss.   Eyes:  Negative for blurred vision and double vision.  Respiratory:  Negative for cough and shortness  of breath.   Cardiovascular:  Negative for chest pain and leg swelling.  Gastrointestinal:  Negative for abdominal pain, blood in stool, constipation, diarrhea, nausea and vomiting.  Genitourinary: Negative.   Musculoskeletal:  Positive for  back pain and joint pain (ankle surgery about 40 years ago). Negative for neck pain.  Skin:  Negative for itching and rash.  Neurological:  Positive for speech change and weakness. Negative for tingling, sensory change (chronic L ankle numbness) and headaches.  Psychiatric/Behavioral:  Negative for depression. The patient has insomnia.    Past Medical History:  Diagnosis Date   Arthritis    Carotid artery disease    Closed fracture dislocation of right elbow    30 years ago -- fell out of theback of a truck   Diabetes mellitus without complication (HCC)    Hx of colonic polyps 02/23/2017   Hyperlipidemia    Hypertension    Diagnosed 10 years ago. No on medication. Denies history of CAD.    Memory loss    PAD (peripheral artery disease)    Past Surgical History:  Procedure Laterality Date   OLECRANON BURSECTOMY Right 08/18/2016   Procedure: OLECRANON BURSECTOMY;  Surgeon: Addie Glendia Hacker, MD;  Location: [facility];  Service: Orthopedics;  Laterality: Right;   Family History  Problem Relation Age of Onset   Hypertension Mother    Dementia Mother        81s   Transient ischemic attack Mother        38s   Kidney disease Father    COPD Father    Hypertension Brother    Lung cancer Maternal Aunt    Alcohol abuse Maternal Grandfather    Mental illness Paternal Grandmother    Hypertension Daughter    Healthy Son    Learning disabilities Son    Colon cancer Neg Hx    Colon polyps Neg Hx    Esophageal cancer Neg Hx    Rectal cancer Neg Hx    Stomach cancer Neg Hx    Social History:  reports that he quit smoking about 57 years ago. His smoking use included cigarettes. He started smoking about 16 months ago. He has never used smokeless tobacco.  He reports that he does not drink alcohol and does not use drugs. Allergies:  Allergies  Allergen Reactions   Antibacterial Hand Soap [Triclosan] Other (See Comments)    Caused water blisters   Medications Prior to Admission  Medication Sig Dispense Refill   amLODipine  (NORVASC ) 10 MG tablet TAKE 1 TABLET BY MOUTH EVERY DAY (Patient taking differently: Take 5 mg by mouth daily. Take one-half tablet (5mg ) by mouth every afternoon.) 90 tablet 1   aspirin  EC 81 MG tablet Take 1 tablet (81 mg total) by mouth daily. Swallow whole.     escitalopram  (LEXAPRO ) 20 MG tablet Take 1 tablet (20 mg total) by mouth daily. 90 tablet 3   losartan  (COZAAR ) 50 MG tablet TAKE 1 TABLET BY MOUTH EVERY DAY 90 tablet 1   nicotine  polacrilex (COMMIT) 2 MG lozenge Take 2 mg by mouth 4 (four) times daily as needed for smoking cessation.     rosuvastatin  (CRESTOR ) 40 MG tablet TAKE 1 TABLET BY MOUTH EVERY DAY 90 tablet 2   warfarin (COUMADIN ) 5 MG tablet Take 1-2 tablets daily or as prescribed by coumadin  clinic (Patient taking differently: Take 2.5-5 mg by mouth daily in the afternoon. Taken one tablet (5mg ) by mouth every day except Wednesday.  ON WEDNESDAY ONLY, take one-half tablet (2.5mg ) as currently prescribed by coumadin  clinic.) 90 tablet 1     Blood pressure (!) 148/77, pulse (!) 51, temperature 97.9 F (36.6 C), temperature source Oral, resp. rate 18, height 5' 11 (1.803 m),  weight 71.2 kg, SpO2 96%. Physical Exam  General: No apparent distress HEENT: Head is normocephalic, atraumatic, sclera anicteric, oral mucosa pink and moist Neck: Supple without JVD or lymphadenopathy Heart: Reg rate and rhythm. No murmurs rubs or gallops Chest: CTA bilaterally without wheezes, rales, or rhonchi; no distress Abdomen: Soft, non-tender, non-distended, bowel sounds positive. Extremities: No clubbing, cyanosis, or edema.  Psych: Pt's affect is appropriate. Pt is cooperative. Very pleasant Skin: Clean and intact  without signs of breakdown Neuro:    Mental Status: AAOx3, memory intact, fund of knowledge appropriate Speech/Languate: Naming and repetition intact, dysarthria, follows simple commands +recalls 0/3 words about 5 min later CRANIAL NERVES: II: PERRL. Visual fields full III, IV, VI: EOM intact, no gaze preference or deviation V: normal sensation bilaterally VII: Slight left facial droop VIII: a little HOH IX, X: normal palatal elevation XI: 5/5 head turn and 5/5 shoulder shrug bilaterally XII: Tongue midline   MOTOR: RUE: 5/5 Deltoid, 5/5 Biceps, 5/5 Triceps,5/5 Grip LUE: 4+/5 Deltoid, 4+/5 Biceps, 5/5 Triceps, 5/5 Grip RLE: HF 5/5, KE 5/5, ADF 5/5, APF 5/5 LLE: HF 4/5, KE 4+/5, ADF 5/5, APF 5/5   REFLEXES: No ankle clonus  SENSORY: Altered in distal LUE and LLE- reports chronic  Coordination:  finger to nose altered on the left  Musculoskeletal: hammertoe 2nd toe left   Results for orders placed or performed during the hospital encounter of 11/01/23 (from the past 24 hours)  Glucose, capillary     Status: Abnormal   Collection Time: 11/02/23 12:17 PM  Result Value Ref Range   Glucose-Capillary 133 (H) 70 - 99 mg/dL  Glucose, capillary     Status: Abnormal   Collection Time: 11/02/23  5:04 PM  Result Value Ref Range   Glucose-Capillary 111 (H) 70 - 99 mg/dL  Glucose, capillary     Status: Abnormal   Collection Time: 11/02/23  9:30 PM  Result Value Ref Range   Glucose-Capillary 161 (H) 70 - 99 mg/dL  Protime-INR     Status: None   Collection Time: 11/03/23  2:48 AM  Result Value Ref Range   Prothrombin Time 14.7 11.4 - 15.2 seconds   INR 1.1 0.8 - 1.2  Glucose, capillary     Status: Abnormal   Collection Time: 11/03/23  7:23 AM  Result Value Ref Range   Glucose-Capillary 105 (H) 70 - 99 mg/dL   CT HEAD WO CONTRAST ( ) Result Date: 11/03/2023 EXAM: CT HEAD WITHOUT CONTRAST 11/03/2023 06:26:37 AM TECHNIQUE: CT of the head was performed without the  administration of intravenous contrast. Automated exposure control, iterative reconstruction, and/or weight based adjustment of the mA/kV was utilized to reduce the radiation dose to as low as reasonably achievable. COMPARISON: Head CT and brain MRI dated 11/01/2023. CLINICAL HISTORY: 78 year old male with stroke, follow up. Right thalamic/posterior limb internal capsule hemorrhage 2 days ago. FINDINGS: BRAIN AND VENTRICLES: Oval hyperdense hemorrhage at the anterior and lateral right thalamus, likely involving some of the posterior limb right internal capsule, now measures 11 x 15 x 16 mm. Estimated volume: 1.4 mL. This has not significantly changed since presentation. No intraventricular or extra axial extension of blood. No significant regional mass effect. Elsewhere stable gray white matter differentiation with advanced chronic small vessel disease changes in the deep white matter and deep gray matter nuclei. No evidence of acute infarct. No hydrocephalus. No midline shift. ORBITS: No acute abnormality. SINUSES: No acute abnormality. SOFT TISSUES AND SKULL: No acute soft tissue abnormality. No skull  fracture. IMPRESSION: 1. Stable right thalamic/posterior limb internal capsule hemorrhage (estimated volume 1.4 mL), without significant regional mass effect or intraventricular/extra-axial extension. 2. Underlying Advanced chronic small vessel disease. Electronically signed by: Helayne Hurst MD 11/03/2023 06:44 AM EDT RP Workstation: [id number]   CT ANGIO HEAD NECK W WO CM Result Date: 11/02/2023 EXAM: CTA HEAD AND NECK WITH AND WITHOUT 11/02/2023 04:32:40 AM TECHNIQUE: CTA of the head and neck was performed with and without the administration of 75 mL of iohexol  (OMNIPAQUE ) 350 MG/ML injection. Multiplanar 2D and/or 3D reformatted images are provided for review. Automated exposure control, iterative reconstruction, and/or weight based adjustment of the mA/kV was utilized to reduce the radiation dose to as low as  reasonably achievable. Stenosis of the internal carotid arteries measured using NASCET criteria. COMPARISON: Brain MRI yesterday, head CT yesterday, and earlier. CLINICAL HISTORY: 78 year old male with acute neuro deficit, stroke suspected. FINDINGS: CTA NECK: AORTIC ARCH AND ARCH VESSELS: Partially visible advanced aortic arch atherosclerosis. Arch is not completely visible, probable 3 vessel arch configuration. Proximal subclavian artery atherosclerosis without significant stenosis. No dissection or arterial injury. CERVICAL CAROTID ARTERIES: Mild right CCA atherosclerosis without stenosis. Bulky soft and calcified plaque at the right ICA origin results in narrowing with 80 percent stenosis. Mild right ICA atherosclerosis below the skull base. Patent left CCA with atherosclerosis but no significant stenosis. Bulky soft and calcified plaque at the left ICA origin with 76 percent stenosis. Left ICA remains patent with minor calcified plaque below the skull base. No dissection or arterial injury. CERVICAL VERTEBRAL ARTERIES: Right vertebral artery is patent, mildly dominant, and mildly tortuous to the skull base with mild plaque and no stenosis. Left vertebral artery origin soft and calcified plaque resulting in mild stenosis. Nondominant left vertebral artery is patent with mild additional plaque to the skull base. No dissection or arterial injury. LUNGS AND MEDIASTINUM: Centrilobular Emphysema and apical lung scarring in the visible upper chest. SOFT TISSUES: Nonvascular neck soft tissue spaces are within normal limits. BONES: Largely absent dentition. Advanced chronic cervical spine degeneration with degenerative appearing interbody ankylosis at C4-C5, bulky disc and endplate degeneration elsewhere including vacuum disc at C5-C6. No acute osseous abnormality. CTA HEAD: ANTERIOR CIRCULATION: Both ICA siphons are patent. Left siphon moderate calcified plaque, right siphon similar moderate calcified plaque. Both ICA  siphons also are tortuous; only mild siphon stenosis results. There is a distal left ICA 4 x 2 mm triangular aneurysm vs infundibulum of a diminutive left posterior communicating artery. Patent carotid terminus. Ectatic anterior communicating artery and anteriorly directed 3 mm anterior communicating artery aneurysm on series 5 image 95 and series 9 image 89. Tortuous bilateral ACA branches are otherwise within normal limits. Left MCA M1 segment is patent but with mild to moderate distal M1 irregularity and stenosis on series 11 image 46. Left MCA trifurcation, is ectatic as seen on series 10 image 21. No discrete saccular aneurysm there. Contralateral right MCA M1 segment is patent with mild ectasia. Right MCA bifurcation is patent without stenosis. Bilateral MCA branches are patent without stenosis. POSTERIOR CIRCULATION: Distal vertebral arteries and vertebrobasilar junction are patent without stenosis. Both PICA origins are patent. Right vertebral V4 segment is dominant with mild to moderate calcified plaque. Patent basilar artery without stenosis. Patent SCA and PCA origins. Diminutive or absent posterior communicating arteries. Left PCA branches are patent with up to moderate distal P2 segment irregularity and stenosis on series 12 image 22. Right PCA branches are patent with mild to moderate distal P2 segment  irregularity on series 12 image 16. OTHER: Major dural venous sinuses appear grossly patent with early intracranial venous contrast timing. No CTA spot sign of the right deep gray matter hyperdense hemorrhage; no abnormal vascularity there. IMPRESSION: 1. Positive for advanced atherosclerosis and evidence of small intracranial aneurysms (see #3). Negative for large vessel occlusion or CTA spot sign associated with small acute intra-axial hemorrhage 2. High grade Bilateral ICA origin stenosis: 80 percent stenosis on the right, 76 percent stenosis on the left. 3. Distal left ICA 4 x 2 mm saccular aneurysm  versus infundibulum and Anterior communicating artery aneurysm measuring 3 mm. Electronically signed by: Helayne Hurst MD 11/02/2023 05:56 AM EDT RP Workstation: [id number]   MR BRAIN WO CONTRAST Result Date: 11/01/2023 EXAM: MRI BRAIN WITHOUT CONTRAST 11/01/2023 10:08:51 PM TECHNIQUE: Multiplanar multisequence MRI of the head/brain was performed without the administration of intravenous contrast. COMPARISON: 12/04/2019 and 11/01/2023. CLINICAL HISTORY: Neuro deficit, acute, stroke suspected. FINDINGS: BRAIN AND VENTRICLES: No acute infarct. Intraparenchymal hemorrhage centered at the right basal ganglia, unchanged in size. Multiple chronic micro hemorrhages in the pons and cerebellum, as well as within the thalami and supratentorial white matter. Early confluent hyperintense T2-weighted signal within the cerebral white matter, most commonly due to chronic small vessel disease. No mass. No midline shift. No hydrocephalus. The sella is unremarkable. Normal flow voids. ORBITS: No acute abnormality. SINUSES AND MASTOIDS: No acute abnormality. BONES AND SOFT TISSUES: Normal marrow signal. No acute soft tissue abnormality. IMPRESSION: 1. Unchanged right basal ganglia intraparenchymal hemorrhage. 2. Multiple chronic microhemorrhages in the pons, cerebellum, thalami, and supratentorial white matter. 3. Early confluent chronic small vessel disease. Electronically signed by: Franky Stanford MD 11/01/2023 10:18 PM EDT RP Workstation: [id number]   CT Head Wo Contrast Result Date: 11/01/2023 CLINICAL DATA:  Neuro deficit, acute, stroke suspected EXAM: CT HEAD WITHOUT CONTRAST TECHNIQUE: Contiguous axial images were obtained from the base of the skull through the vertex without intravenous contrast. RADIATION DOSE REDUCTION: This exam was performed according to the departmental dose-optimization program which includes automated exposure control, adjustment of the mA and/or kV according to patient size and/or use of iterative  reconstruction technique. COMPARISON:  MR head 12/04/2019 FINDINGS: Brain: No evidence of large-territorial acute infarction. Acute, at least 1.8 cm, right intraparenchymal hemorrhage centered in the posterior limb of the internal capsule. No mass lesion. No extra-axial collection. No mass effect or midline shift. No hydrocephalus. Basilar cisterns are patent. Vascular: No hyperdense vessel. Ca Atherosclerotic calcifications are present within the cavernous internal carotid and vertebral arteries. Skull: No acute fracture or focal lesion. Sinuses/Orbits: Paranasal sinuses and mastoid air cells are clear. The orbits are unremarkable. Other: None. IMPRESSION: Acute, at least 1.8 cm, right intraparenchymal hemorrhage centered in the posterior limb of the internal capsule. These results were called by telephone at the time of interpretation on 11/01/2023 at 7:07 pm to provider KEVIN STEINL , who verbally acknowledged these results. Electronically Signed   By: Morgane  Naveau M.D.   On: 11/01/2023 19:12    Assessment/Plan: Diagnosis: Intraparenchymal Hemorrhage of the right basal ganglia  Does the need for close, 24 hr/day medical supervision in concert with the patient's rehab needs make it unreasonable for this patient to be served in a less intensive setting? Yes Co-Morbidities requiring supervision/potential complications:  - A-fib on chronic anticoagulation, hypertension, hyperlipidemia, diabetes mellitus type 2, coronary artery disease, history of stroke/TIA Due to bladder management, bowel management, safety, skin/wound care, disease management, medication administration, pain management, and patient education, does the patient  require 24 hr/day rehab nursing? Yes Does the patient require coordinated care of a physician, rehab nurse, therapy disciplines of PT, OT, SLP to address physical and functional deficits in the context of the above medical diagnosis(es)? Yes Addressing deficits in the following  areas: balance, endurance, locomotion, strength, transferring, bowel/bladder control, bathing, dressing, feeding, grooming, toileting, cognition, speech, language, swallowing, and psychosocial support Can the patient actively participate in an intensive therapy program of at least 3 hrs of therapy per day at least 5 days per week? Yes The potential for patient to make measurable gains while on inpatient rehab is excellent Anticipated functional outcomes upon discharge from inpatient rehab are modified independent and supervision  with PT, modified independent and supervision with OT, modified independent and supervision with SLP. Estimated rehab length of stay to reach the above functional goals is: 7 to 10 days Anticipated discharge destination: Home Overall Rehab/Functional Prognosis: excellent  POST ACUTE RECOMMENDATIONS: This patient's condition is appropriate for continued rehabilitative care in the following setting: CIR Patient has agreed to participate in recommended program. Yes Note that insurance prior authorization may be required for reimbursement for recommended care.  Comment: I think patient would be a great candidate for CIR. Rehab admissions coordinator to f/u.      I have personally performed a face to face diagnostic evaluation of this patient. Additionally, I have examined the patient's medical record including any pertinent labs and radiographic images.    Thanks,  Murray Collier, MD 11/03/2023

## 2023-11-03 NOTE — Progress Notes (Signed)
 Physical Therapy Treatment Patient Details Name: John Woodward. MRN: [id number] DOB: 1945-06-17 Today's Date: 11/03/2023   History of Present Illness 78 y.o. male presents to [facility] hospital on 11/01/2023 with gait instability. CT head with findings of small R basal ganglia ICH. PMH includes CAD, PAD, memory loss, HTN, HLD, afib.    PT Comments  Pt tolerates treatment well but continues to demonstrate a persistent leftward stagger during ambulation. Pt has poor recall of this tendency from PT evaluation yesterday, and demonstrates no recall of OT evaluation this morning. Pt remains at a high risk for falls due to impaired balance and poor memory. Patient will benefit from intensive inpatient follow-up therapy, >3 hours/day.    If plan is discharge home, recommend the following: A little help with walking and/or transfers;A little help with bathing/dressing/bathroom;Assistance with cooking/housework;Assist for transportation;Help with stairs or ramp for entrance   Can travel by private vehicle        Equipment Recommendations  BSC/3in1    Recommendations for Other Services       Precautions / Restrictions Precautions Precautions: Fall Recall of Precautions/Restrictions: Impaired Restrictions Weight Bearing Restrictions Per Provider Order: No     Mobility  Bed Mobility Overal bed mobility: Needs Assistance Bed Mobility: Supine to Sit, Sit to Supine     Supine to sit: Supervision Sit to supine: Supervision        Transfers Overall transfer level: Needs assistance Equipment used: None Transfers: Sit to/from Stand Sit to Stand: Contact guard assist                Ambulation/Gait Ambulation/Gait assistance: Mod assist Gait Distance (Feet): 300 Feet Assistive device: None Gait Pattern/deviations: Step-through pattern, Staggering left Gait velocity: reduced Gait velocity interpretation: <1.8 ft/sec, indicate of risk for recurrent falls   General Gait Details: pt  continues to demonstrate leftward loss of balance. Pt has impaired attention during session and experiences more frequent losses of balance when not attending to gait pattern. Pt consistently loses balance with attempts at dynamic gait tasks, including changing stride length and backward stepping   Stairs             Wheelchair Mobility     Tilt Bed    Modified Rankin (Stroke Patients Only) Modified Rankin (Stroke Patients Only) Pre-Morbid Rankin Score: No symptoms Modified Rankin: Moderately severe disability     Balance Overall balance assessment: Needs assistance Sitting-balance support: No upper extremity supported, Feet supported Sitting balance-Leahy Scale: Fair     Standing balance support: No upper extremity supported, During functional activity Standing balance-Leahy Scale: Poor Standing balance comment: CGA                            Communication Communication Communication: No apparent difficulties  Cognition Arousal: Alert Behavior During Therapy: WFL for tasks assessed/performed   PT - Cognitive impairments: History of cognitive impairments, Memory, Awareness, Safety/Judgement                         Following commands: Intact      Cueing Cueing Techniques: Verbal cues, Tactile cues  Exercises      General Comments General comments (skin integrity, edema, etc.): VSS on RA      Pertinent Vitals/Pain Pain Assessment Pain Assessment: No/denies pain    Home Living  Prior Function            PT Goals (current goals can now be found in the care plan section) Acute Rehab PT Goals Patient Stated Goal: to return to independence Progress towards PT goals: Progressing toward goals    Frequency    Min 3X/week      PT Plan      Co-evaluation              AM-PAC PT 6 Clicks Mobility   Outcome Measure  Help needed turning from your back to your side while in a flat bed  without using bedrails?: A Little Help needed moving from lying on your back to sitting on the side of a flat bed without using bedrails?: A Little Help needed moving to and from a bed to a chair (including a wheelchair)?: A Little Help needed standing up from a chair using your arms (e.g., wheelchair or bedside chair)?: A Little Help needed to walk in hospital room?: A Lot Help needed climbing 3-5 steps with a railing? : A Lot 6 Click Score: 16    End of Session Equipment Utilized During Treatment: Gait belt Activity Tolerance: Patient tolerated treatment well Patient left: in bed;with call bell/phone within reach;with bed alarm set;with family/visitor present Nurse Communication: Mobility status PT Visit Diagnosis: Other abnormalities of gait and mobility (R26.89);Other symptoms and signs involving the nervous system (R29.898)     Time: 1333-1350 PT Time Calculation (min) (ACUTE ONLY): 17 min  Charges:    $Gait Training: 8-22 mins PT General Charges $$ ACUTE PT VISIT: 1 Visit                     Bernardino JINNY Ruth, PT, DPT Acute Rehabilitation Office [phone]    Bernardino JINNY Ruth 11/03/2023, 3:11 PM

## 2023-11-04 ENCOUNTER — Encounter (HOSPITAL_COMMUNITY): Payer: Self-pay | Admitting: Neurology

## 2023-11-04 LAB — GLUCOSE, CAPILLARY
Glucose-Capillary: 131 mg/dL — ABNORMAL HIGH (ref 70–99)
Glucose-Capillary: 138 mg/dL — ABNORMAL HIGH (ref 70–99)
Glucose-Capillary: 94 mg/dL (ref 70–99)
Glucose-Capillary: 260 mg/dL — ABNORMAL HIGH (ref 70–99)

## 2023-11-04 NOTE — Progress Notes (Signed)
 STROKE TEAM PROGRESS NOTE    SIGNIFICANT HOSPITAL EVENTS: 10/6 - 78 y.o. male admitted with c/o fall yesterday afternoon around 2 PM. At the time of fall, patient denied faintness, dizziness, head injury or LOC, however began to feel dizzy and off balance the following day. He also had some L facial weakness and mildly slurred speech.   CT Head showed 1.8 cm, right intraparenchymal hemorrhage centered in the posterior limb of the internal capsule.    MRI showed right basal ganglia intraparenchymal hemorrhage, and multiple chronic hemorrhages in the pons, cerebellum, thalami, and supratentorial white matter.   INTERIM HISTORY/SUBJECTIVE: Patient seen at bedside and reports he is doing well today.  His blood pressure is adequately controlled. He is awaiting CIR admission.  He and his family are interested in considering ASPIRE trial(Eliquis  versus aspirin  following intracerebral hemorrhage on anticoagulation and A-fib patient's).   OBJECTIVE:  CBC    Component Value Date/Time   WBC 4.9 11/01/2023 1800   RBC 4.74 11/01/2023 1800   HGB 14.9 11/01/2023 1800   HGB 14.3 11/10/2022 1305   HCT 45.5 11/01/2023 1800   HCT 43.4 11/10/2022 1305   PLT 212 11/01/2023 1800   MCV 96.0 11/01/2023 1800   MCH 31.4 11/01/2023 1800   MCHC 32.7 11/01/2023 1800   RDW 13.0 11/01/2023 1800   LYMPHSABS 1.1 05/26/2023 1114   MONOABS 0.3 05/26/2023 1114   EOSABS 0.3 05/26/2023 1114   BASOSABS 0.1 05/26/2023 1114    BMET    Component Value Date/Time   NA 141 11/01/2023 1800   NA 143 11/18/2022 1022   K 3.9 11/01/2023 1800   CL 104 11/01/2023 1800   CO2 26 11/01/2023 1800   GLUCOSE 198 (H) 11/01/2023 1800   BUN 18 11/01/2023 1800   BUN 19 11/18/2022 1022   CREATININE 1.30 (H) 11/01/2023 1800   CREATININE 1.16 02/02/2020 1109   CALCIUM  8.5 (L) 11/01/2023 1800   EGFR 53 (L) 11/18/2022 1022   GFRNONAA 57 (L) 11/01/2023 1800    IMAGING past 24 hours No results found.   Vitals:   11/04/23  0043 11/04/23 0446 11/04/23 0847 11/04/23 1154  BP: 138/65 138/62 (!) 113/57 (!) 118/56  Pulse: 60 (!) 55 60 (!) 57  Resp: 18 19 18 18   Temp: 98.6 F (37 C) 98.3 F (36.8 C) 97.6 F (36.4 C) 98.3 F (36.8 C)  TempSrc: Oral Oral Oral Oral  SpO2: 95% 96% 95% 99%  Weight:      Height:         PHYSICAL EXAM: General:  Alert, well-nourished, well-developed patient in no acute distress Psych:  Mood and affect appropriate for situation CV: Regular rate and rhythm on monitor Respiratory: Regular, unlabored respirations on room air   NEURO:  Mental Status: AA&Ox3, patient is able to give clear and coherent history Speech/Language: Speech is without dysarthria or aphasia. Naming, repetition, fluency, and comprehension intact.  Cranial Nerves:  II: PERRL. Visual fields full.  III, IV, VI: EOMI. Eyelids elevate symmetrically.  V: Sensation is intact to light touch and symmetrical to face.  VII: Face is symmetrical resting and smiling VIII: hearing intact to voice. IX, X: Palate elevates symmetrically. Phonation is normal.  KP:Dynloizm shrug 5/5. XII: tongue is midline without fasciculations. Motor: Strength - RUE 4/5; RLE 4/5; LUE 5/5; LLE 5/5. Tone: is normal and bulk is normal Sensation- Diminished sensation in R foot, however patient reports this is from a past surgery. Otherwise, intact to light touch bilaterally. Extinction absent to  light touch to DSS.   Coordination: FTN intact bilaterally, HKS: no ataxia in BLE. No drift.  Gait- deferred  Most Recent NIH: 1   ASSESSMENT/PLAN:  [name]. is a 78 y.o. male with history of HTN, HLD, Afib on Coumadin , CAD, PAD, memory loss, and former tobacco use presenting with unstable gait, found to have right basal ganglia intraparenchymal hemorrhage likely related to warfarin anticoagulation for chronic A-fib and possibly an uncontrolled hypertension. NIH on Admission 4.   On assessment today, patient with residual  right-sided arm and leg weakness, but improving. Repeat CT head this morning showed stable R thalamic / posterior limb hemorrhage without significant mass effect or interventricular/extra-axial extension. He was transferred out of the ICU to progressive today. He is awaiting CIR admission.   Intraparenchymal Hemorrhage: right basal ganglia Etiology:  Hypertensive and warfarin related as patient with A-fib on anticoagulation Code Stroke CT head: Acute, at least 1.8 cm, right intraparenchymal hemorrhage centered in the posterior limb of the internal capsule. Repeat CT head (10/8): Stable right thalamic/posterior limb internal capsule hemorrhage (estimated volume 1.4 mL), without significant regional mass effect or intraventricular/extra-axial extension. CTA head & neck:  Positive for advanced atherosclerosis and evidence of small intracranial aneurysms (see #3). Negative for large vessel occlusion or CTA spot sign associated with small acute intra-axial hemorrhage. High grade Bilateral ICA origin stenosis: 80 percent stenosis on the right, 76 percent stenosis on the left. 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 MRI: Unchanged right basal ganglia intraparenchymal hemorrhage. Multiple chronic microhemorrhages in the pons, cerebellum, thalami, and supratentorial white matter. Early confluent chronic small vessel disease.  2D Echo in 06/2023 - EF 60-65%, normal MV, normal LA size, no LVH LDL 52 HgbA1c 6.4 VTE prophylaxis - SCD's Patient on Warfarin daily prior to admission, now holding antithrombotics d/t hemorrhagic stroke Therapy recommendations: CIR Disposition: Pending   Hx of Stroke/TIA Hx of chronic lacunar infarcts dating back to 2020 as seen on MRI   Atrial fibrillation Home Meds: hold home Warfarin Continue telemetry monitoring   Hypertension Home meds: Amlodipine  10 mg daily Stable Blood Pressure Goal: SBP  between 130-150 for 24 hours and then less than 160    Hyperlipidemia Home meds:  Rosuvastatin  40 mg daily, resumed in hospital LDL 52, goal < 70 Continue statin at discharge   Diabetes type II Controlled Home meds: None HgbA1c 6.4, goal < 7.0 CBGs SSI Recommend close follow-up with PCP for better DM control   Other Stroke Risk Factors Coronary artery disease   Hospital day # 3   Patient remains neurologically stable and making good improvement.  Transfer out of ICU to neurology floor bed.  Last medical hospitalist team to resume care.  Physical occupational and rehab consults.  Patient and family considering possible participation in ASPIRE trial. Medically stable to be transferred to inpatient rehab when bed available Eather Popp, MD Medical Director [facility] Pager: [phone] 11/04/2023 1:17 PM   To contact Stroke Continuity provider, please refer to [email or web address]. After hours, contact General Neurology

## 2023-11-04 NOTE — Progress Notes (Signed)
 Physical Therapy Treatment Patient Details Name: John Woodward. MRN: [id number] DOB: 23-Jan-1946 Today's Date: 11/04/2023   History of Present Illness 78 y.o. male presents to [facility] on 11/01/2023 with gait instability. CT head with findings of small R basal ganglia ICH. PMH includes CAD, PAD, memory loss, HTN, HLD, afib.    PT Comments  Focused session on trying to improve pt's midline alignment and gait stability as he tends to not shift his weight enough to his R due to a L bias, resulting in L staggering and LOB. Pt often needed min-modA to recover during these LOB bouts. Tactile and verbal cues to push his R hip into therapist's hand on his R lateral hip did help in facilitating R weight shift to improve midline balance when stepping over tiles. He displayed difficulty balancing when ambulating while performing a cognitive dual task as well, favoring the cognitive task over the motor task. He remains at high risk for falls. Will continue to follow acutely.    If plan is discharge home, recommend the following: A little help with walking and/or transfers;A little help with bathing/dressing/bathroom;Assistance with cooking/housework;Assist for transportation;Help with stairs or ramp for entrance;Direct supervision/assist for medications management;Direct supervision/assist for financial management;Supervision due to cognitive status   Can travel by private vehicle        Equipment Recommendations  BSC/3in1    Recommendations for Other Services       Precautions / Restrictions Precautions Precautions: Fall Recall of Precautions/Restrictions: Impaired Restrictions Weight Bearing Restrictions Per Provider Order: No     Mobility  Bed Mobility Overal bed mobility: Needs Assistance Bed Mobility: Supine to Sit     Supine to sit: Supervision, HOB elevated     General bed mobility comments: Supervision for safety exiting L EOB    Transfers Overall transfer level: Needs  assistance Equipment used: None Transfers: Sit to/from Stand Sit to Stand: Contact guard assist           General transfer comment: No LOB, CGA for safety standing from EOB    Ambulation/Gait Ambulation/Gait assistance: Mod assist, Min assist, Contact guard assist Gait Distance (Feet): 260 Feet Assistive device: None Gait Pattern/deviations: Step-through pattern, Staggering left, Decreased stride length, Decreased weight shift to right Gait velocity: reduced Gait velocity interpretation: <1.8 ft/sec, indicate of risk for recurrent falls   General Gait Details: Pt ambulates walking on the lateral aspect of his L foot, reportedly due to an old injury. He has a L bias, often stepping over obstacles landing towards the L of the obstacle, even when approached directly straight on. The pt easily staggers to the L when distracted by cognitive or motor tasks, needing min-modA to regain balance. When focused on his gait balance, he is able to ambulate at a CGA-minA level though. Cued pt to step over blue tiles to focus on midline alignment by stepping directly from center of one edge to the center of the other edge, but pt would often veer or stagger L, despite max cues to weight shift more to the R as pt often has decreased R weight shift. Pt needed cues to avoid stepping on blue tiles with L foot when weaving in and out of them.   Stairs             Wheelchair Mobility     Tilt Bed    Modified Rankin (Stroke Patients Only) Modified Rankin (Stroke Patients Only) Pre-Morbid Rankin Score: No symptoms Modified Rankin: Moderately severe disability  Balance Overall balance assessment: Needs assistance Sitting-balance support: No upper extremity supported, Feet supported Sitting balance-Leahy Scale: Fair     Standing balance support: No upper extremity supported, During functional activity Standing balance-Leahy Scale: Poor Standing balance comment: multiple LOB bouts to the L  when ambulating, especially when facing motor obstacles or cognitive dual tasking, needing min-modA to recover                            Communication Communication Communication: No apparent difficulties  Cognition Arousal: Alert Behavior During Therapy: WFL for tasks assessed/performed   PT - Cognitive impairments: History of cognitive impairments, Memory, Awareness, Safety/Judgement, Sequencing, Problem solving                       PT - Cognition Comments: Pt with difficulty dual tasking, favoring the cognitive task over the motor, as pt would often stagger to the L or slow his gait to count backwards by 2s from 40. Pt with decreased awareness of his L bias, needing repeated multi-modal cues to sequence weight shifting to R to reduce L LOB. Following commands: Intact      Cueing Cueing Techniques: Verbal cues, Tactile cues  Exercises Other Exercises Other Exercises: balance training walkiing weaving in and out of x3 blue tiles, x2 reps, minA for balance, no UE support Other Exercises: stepping over blue tiles, aiming to land centrally on other side but pt tends to stagger and land to L, leading with L foot, cuing pt to shift more weight to the R, no UE support, min-modA, x > 7 reps Other Exercises: Dual tasking counting backwards by 2s from 40 while ambulating to challenge gait balance, pt lost balance to L or slowed gait, needing minA for balance, no UE support, slow but no mistakes noted cognitively    General Comments        Pertinent Vitals/Pain Pain Assessment Pain Assessment: Faces Faces Pain Scale: No hurt Pain Intervention(s): Monitored during session    Home Living                          Prior Function            PT Goals (current goals can now be found in the care plan section) Acute Rehab PT Goals Patient Stated Goal: to return to independence PT Goal Formulation: With patient/family Time For Goal Achievement:  11/16/23 Potential to Achieve Goals: Good Progress towards PT goals: Progressing toward goals    Frequency    Min 3X/week      PT Plan      Co-evaluation              AM-PAC PT 6 Clicks Mobility   Outcome Measure  Help needed turning from your back to your side while in a flat bed without using bedrails?: A Little Help needed moving from lying on your back to sitting on the side of a flat bed without using bedrails?: A Little Help needed moving to and from a bed to a chair (including a wheelchair)?: A Little Help needed standing up from a chair using your arms (e.g., wheelchair or bedside chair)?: A Little Help needed to walk in hospital room?: A Lot Help needed climbing 3-5 steps with a railing? : A Lot 6 Click Score: 16    End of Session Equipment Utilized During Treatment: Gait belt Activity Tolerance: Patient tolerated treatment well  Patient left: with call bell/phone within reach;with family/visitor present;in chair;with chair alarm set   PT Visit Diagnosis: Other abnormalities of gait and mobility (R26.89);Other symptoms and signs involving the nervous system (R29.898);Unsteadiness on feet (R26.81);Difficulty in walking, not elsewhere classified (R26.2)     Time: 8770-8753 PT Time Calculation (min) (ACUTE ONLY): 17 min  Charges:    $Gait Training: 8-22 mins PT General Charges $$ ACUTE PT VISIT: 1 Visit                     Theo Ferretti, PT, DPT Acute Rehabilitation Services  Office: [phone]    Theo CHRISTELLA Ferretti 11/04/2023, 1:50 PM

## 2023-11-04 NOTE — Plan of Care (Signed)
  Problem: Education: Goal: Knowledge of disease or condition will improve Outcome: Progressing Goal: Knowledge of secondary prevention will improve (MUST DOCUMENT ALL) Outcome: Progressing Goal: Knowledge of patient specific risk factors will improve (DELETE if not current risk factor) Outcome: Progressing   Problem: Intracerebral Hemorrhage Tissue Perfusion: Goal: Complications of Intracerebral Hemorrhage will be minimized Outcome: Progressing   Problem: Coping: Goal: Will verbalize positive feelings about self Outcome: Progressing Goal: Will identify appropriate support needs Outcome: Progressing   Problem: Health Behavior/Discharge Planning: Goal: Ability to manage health-related needs will improve Outcome: Progressing Goal: Goals will be collaboratively established with patient/family Outcome: Progressing   Problem: Self-Care: Goal: Ability to participate in self-care as condition permits will improve Outcome: Progressing Goal: Verbalization of feelings and concerns over difficulty with self-care will improve Outcome: Progressing Goal: Ability to communicate needs accurately will improve Outcome: Progressing   Problem: Nutrition: Goal: Risk of aspiration will decrease Outcome: Progressing Goal: Dietary intake will improve Outcome: Progressing   Problem: Education: Goal: Ability to describe self-care measures that may prevent or decrease complications (Diabetes Survival Skills Education) will improve Outcome: Progressing Goal: Individualized Educational Video(s) Outcome: Progressing   Problem: Coping: Goal: Ability to adjust to condition or change in health will improve Outcome: Progressing   Problem: Fluid Volume: Goal: Ability to maintain a balanced intake and output will improve Outcome: Progressing   Problem: Health Behavior/Discharge Planning: Goal: Ability to identify and utilize available resources and services will improve Outcome: Progressing Goal:  Ability to manage health-related needs will improve Outcome: Progressing   Problem: Metabolic: Goal: Ability to maintain appropriate glucose levels will improve Outcome: Progressing   Problem: Nutritional: Goal: Maintenance of adequate nutrition will improve Outcome: Progressing Goal: Progress toward achieving an optimal weight will improve Outcome: Progressing   Problem: Skin Integrity: Goal: Risk for impaired skin integrity will decrease Outcome: Progressing   Problem: Tissue Perfusion: Goal: Adequacy of tissue perfusion will improve Outcome: Progressing   Problem: Education: Goal: Knowledge of General Education information will improve Description: Including pain rating scale, medication(s)/side effects and non-pharmacologic comfort measures Outcome: Progressing   Problem: Health Behavior/Discharge Planning: Goal: Ability to manage health-related needs will improve Outcome: Progressing   Problem: Clinical Measurements: Goal: Ability to maintain clinical measurements within normal limits will improve Outcome: Progressing Goal: Will remain free from infection Outcome: Progressing Goal: Diagnostic test results will improve Outcome: Progressing Goal: Respiratory complications will improve Outcome: Progressing Goal: Cardiovascular complication will be avoided Outcome: Progressing   Problem: Activity: Goal: Risk for activity intolerance will decrease Outcome: Progressing   Problem: Nutrition: Goal: Adequate nutrition will be maintained Outcome: Progressing   Problem: Coping: Goal: Level of anxiety will decrease Outcome: Progressing   Problem: Elimination: Goal: Will not experience complications related to bowel motility Outcome: Progressing Goal: Will not experience complications related to urinary retention Outcome: Progressing   Problem: Pain Managment: Goal: General experience of comfort will improve and/or be controlled Outcome: Progressing   Problem:  Safety: Goal: Ability to remain free from injury will improve Outcome: Progressing   Problem: Skin Integrity: Goal: Risk for impaired skin integrity will decrease Outcome: Progressing

## 2023-11-04 NOTE — Progress Notes (Signed)
   Inpatient Rehabilitation Admissions Coordinator   I have insurance approval and likely CIR bed to admit him to Friday. I met with patient and wife and they are in agreement. Acute team made aware. I will follow up in the am.  Heron Leavell, RN, MSN Rehab Admissions Coordinator [phone] 11/04/2023 12:36 PM

## 2023-11-04 NOTE — H&P (Incomplete)
 Physical Medicine and Rehabilitation Admission H&P    Chief Complaint  Patient presents with   Functional decline due to ICH    HPI:  John Woodward. John Woodward is a 78 year old male with history of T2DM, MVR, CKD 3b (Dr. Norine), COPD, AAA/B-CAS, PAD w/claudication, PAF- on coumadin , TIA, memory loss who was admitted on 11/01/23 with fall day prior followed by increased weakness, difficulty with balance, facial droop and slurred speech. INR 3.4 at admission and CT head done revealing 1.8 cm right BG hemorrhage. CTA head/neck showed advanced atherosclerosis, no LVO, 80% R-ICA and 76% L-ICA stenosis and small intra-axial hemorrhage with distal L-ICA saccular aneurysm v/s infundibulum and ACA aneurysm 3 mm. Coumadin  reversed and cleveprex used to keep BP 130-150 range. Patient noted to have right sided weakness and Dr. Rosemarie felt that IPH hypertensive and in setting of warfarin.    2 D echo 06/30/23 showed EF 60-65%, moderate calcification aortic valve, mild ascending aortic dilatation 39 mm and no LVH. CT head repeated on 10/08 showing stable right thalamic/posterior limb internal capsule hemorrhage. Participation in Aspire study v/s ASA discussed with patient and family.    PT/OT has been working with patient who is noted to have staggering gait w/leftward stagger,balance deficits,  poor recall, decreased awareness of deficits    Review of Systems  Constitutional:  Negative for chills and fever.  HENT:  Negative for hearing loss.   Eyes:  Negative for blurred vision and photophobia.  Respiratory:  Negative for cough and shortness of breath.   Cardiovascular:  Negative for chest pain and palpitations.  Gastrointestinal:  Negative for constipation, heartburn and nausea.  Genitourinary:  Negative for dysuria.  Musculoskeletal:  Negative for myalgias.     Past Medical History:  Diagnosis Date   Arthritis    Carotid artery disease    Closed fracture dislocation of right elbow    30 years  ago -- fell out of theback of a truck   Diabetes mellitus without complication (HCC)    Diverticulosis    Hemorrhoids    Hx of colonic polyps 02/23/2017   Hyperlipidemia    Hypertension    Diagnosed 10 years ago. No on medication. Denies history of CAD.    Memory loss    PAD (peripheral artery disease)     Past Surgical History:  Procedure Laterality Date   OLECRANON BURSECTOMY Right 08/18/2016   Procedure: OLECRANON BURSECTOMY;  Surgeon: Addie Glendia Hacker, MD;  Location: [facility] OR;  Service: Orthopedics;  Laterality: Right;    Family History  Problem Relation Age of Onset   Hypertension Mother    Dementia Mother        48s   Transient ischemic attack Mother        52s   Kidney disease Father    COPD Father    Hypertension Brother    Lung cancer Maternal Aunt    Alcohol abuse Maternal Grandfather    Mental illness Paternal Grandmother    Hypertension Daughter    Healthy Son    Learning disabilities Son    Colon cancer Neg Hx    Colon polyps Neg Hx    Esophageal cancer Neg Hx    Rectal cancer Neg Hx    Stomach cancer Neg Hx     Social History:  reports that he quit smoking about 57 years ago. His smoking use included cigarettes. He started smoking about 16 months ago. He has never used smokeless tobacco. He reports that he  does not drink alcohol and does not use drugs.   Allergies  Allergen Reactions   Antibacterial Hand Soap [Triclosan] Other (See Comments)    Caused water blisters    Medications Prior to Admission  Medication Sig Dispense Refill   amLODipine  (NORVASC ) 10 MG tablet TAKE 1 TABLET BY MOUTH EVERY DAY (Patient taking differently: Take 5 mg by mouth daily. Take one-half tablet (5mg ) by mouth every afternoon.) 90 tablet 1   aspirin  EC 81 MG tablet Take 1 tablet (81 mg total) by mouth daily. Swallow whole.     escitalopram  (LEXAPRO ) 20 MG tablet Take 1 tablet (20 mg total) by mouth daily. 90 tablet 3   losartan  (COZAAR ) 50 MG tablet TAKE 1 TABLET BY MOUTH  EVERY DAY 90 tablet 1   nicotine  polacrilex (COMMIT) 2 MG lozenge Take 2 mg by mouth 4 (four) times daily as needed for smoking cessation.     rosuvastatin  (CRESTOR ) 40 MG tablet TAKE 1 TABLET BY MOUTH EVERY DAY 90 tablet 2   warfarin (COUMADIN ) 5 MG tablet Take 1-2 tablets daily or as prescribed by coumadin  clinic (Patient taking differently: Take 2.5-5 mg by mouth daily in the afternoon. Taken one tablet (5mg ) by mouth every day except Wednesday.  ON WEDNESDAY ONLY, take one-half tablet (2.5mg ) as currently prescribed by coumadin  clinic.) 90 tablet 1      Home: Home Living Family/patient expects to be discharged to:: Private residence Living Arrangements: Spouse/significant other, Children Available Help at Discharge: Family, Available 24 hours/day Type of Home: House Home Access: Stairs to enter [employer] of Steps: 5 Entrance Stairs-Rails: Right, Left Home Layout: One level Bathroom Shower/Tub: Psychologist, counselling, Engineer, manufacturing systems: Standard Bathroom Accessibility: Yes Home Equipment: Agricultural consultant (2 wheels), [employer] - single point, Information systems manager  Lives With: Spouse   Functional History: Prior Function Prior Level of Function : Independent/Modified Independent, Driving Mobility Comments: ambulatory without DME, enjoys working on old cars ADLs Comments: independent, wife does most of the cooking  Functional Status:  Mobility: Bed Mobility Overal bed mobility: Needs Assistance Bed Mobility: Supine to Sit Supine to sit: Supervision, HOB elevated Sit to supine: Supervision General bed mobility comments: Supervision for safety exiting L EOB Transfers Overall transfer level: Needs assistance Equipment used: None Transfers: Sit to/from Stand Sit to Stand: Contact guard assist General transfer comment: No LOB, CGA for safety standing from EOB Ambulation/Gait Ambulation/Gait assistance: Mod assist, Min assist, Contact guard assist Gait Distance (Feet): 260  Feet Assistive device: None Gait Pattern/deviations: Step-through pattern, Staggering left, Decreased stride length, Decreased weight shift to right General Gait Details: Pt ambulates walking on the lateral aspect of his L foot, reportedly due to an old injury. He has a L bias, often stepping over obstacles landing towards the L of the obstacle, even when approached directly straight on. The pt easily staggers to the L when distracted by cognitive or motor tasks, needing min-modA to regain balance. When focused on his gait balance, he is able to ambulate at a CGA-minA level though. Cued pt to step over blue tiles to focus on midline alignment by stepping directly from center of one edge to the center of the other edge, but pt would often veer or stagger L, despite max cues to weight shift more to the R as pt often has decreased R weight shift. Pt needed cues to avoid stepping on blue tiles with L foot when weaving in and out of them. Gait velocity: reduced Gait velocity interpretation: <1.8 ft/sec, indicate  of risk for recurrent falls Stairs: Yes Stairs assistance: Min assist Stair Management: One rail Right, Step to pattern, Forwards Number of Stairs: 5    ADL: ADL Overall ADL's : Needs assistance/impaired Eating/Feeding: Supervision/ safety Grooming: Oral care, Wash/dry face, Contact guard assist, Standing Grooming Details (indicate cue type and reason): LOB x1 to the posterior left without awareness Upper Body Bathing: Supervision/ safety, Sitting Upper Body Bathing Details (indicate cue type and reason): unsafe from standing position Lower Body Bathing: Contact guard assist, Sitting/lateral leans Lower Body Bathing Details (indicate cue type and reason): unsafe from standing position Upper Body Dressing : Sitting, Minimal assistance Upper Body Dressing Details (indicate cue type and reason): extra time required, decreased propioception of LUE Lower Body Dressing: Minimal assistance, Sit  to/from stand Lower Body Dressing Details (indicate cue type and reason): able to perform figure 4 for Bil socks. extra time and effort for LLE. Pt required assist for balance and transfer for sit<>stand dressing Toilet Transfer: Minimal assistance, Ambulation, Rolling walker (2 wheels) Toileting- Clothing Manipulation and Hygiene: Minimal assistance, Sit to/from stand Functional mobility during ADLs: Minimal assistance, Cueing for safety, Rolling walker (2 wheels) General ADL Comments: decreased sensation and proprioception of L side  Cognition: Cognition Overall Cognitive Status: Impaired/Different from baseline Orientation Level: Oriented X4 Attention: Sustained, Selective Sustained Attention: Appears intact Selective Attention: Impaired Selective Attention Impairment: Verbal basic Memory: Impaired Memory Impairment: Storage deficit, Decreased recall of new information Awareness: Impaired Awareness Impairment: Intellectual impairment Safety/Judgment: Impaired Cognition Arousal: Alert Behavior During Therapy: WFL for tasks assessed/performed Overall Cognitive Status: Impaired/Different from baseline  Physical Exam: Blood pressure (!) 153/67, pulse 63, temperature 98 F (36.7 C), temperature source Oral, resp. rate 20, height 5' 11 (1.803 m), weight 71.2 kg, SpO2 100%. Physical Exam Neurological:     Mental Status: He is alert.     Results for orders placed or performed during the hospital encounter of 11/01/23 (from the past 48 hours)  Glucose, capillary     Status: Abnormal   Collection Time: 11/03/23 11:38 AM  Result Value Ref Range   Glucose-Capillary 133 (H) 70 - 99 mg/dL    Comment: Glucose reference range applies only to samples taken after fasting for at least 8 hours.  Glucose, capillary     Status: Abnormal   Collection Time: 11/03/23  4:33 PM  Result Value Ref Range   Glucose-Capillary 112 (H) 70 - 99 mg/dL    Comment: Glucose reference range applies only to  samples taken after fasting for at least 8 hours.  Glucose, capillary     Status: Abnormal   Collection Time: 11/03/23  9:20 PM  Result Value Ref Range   Glucose-Capillary 151 (H) 70 - 99 mg/dL    Comment: Glucose reference range applies only to samples taken after fasting for at least 8 hours.   Comment 1 Notify RN    Comment 2 Document in Chart   Glucose, capillary     Status: Abnormal   Collection Time: 11/04/23  6:14 AM  Result Value Ref Range   Glucose-Capillary 131 (H) 70 - 99 mg/dL    Comment: Glucose reference range applies only to samples taken after fasting for at least 8 hours.   Comment 1 Notify RN    Comment 2 Document in Chart   Glucose, capillary     Status: Abnormal   Collection Time: 11/04/23 11:52 AM  Result Value Ref Range   Glucose-Capillary 138 (H) 70 - 99 mg/dL    Comment: Glucose reference  range applies only to samples taken after fasting for at least 8 hours.  Glucose, capillary     Status: None   Collection Time: 11/04/23  4:53 PM  Result Value Ref Range   Glucose-Capillary 94 70 - 99 mg/dL    Comment: Glucose reference range applies only to samples taken after fasting for at least 8 hours.  Glucose, capillary     Status: Abnormal   Collection Time: 11/04/23  9:13 PM  Result Value Ref Range   Glucose-Capillary 260 (H) 70 - 99 mg/dL    Comment: Glucose reference range applies only to samples taken after fasting for at least 8 hours.   Comment 1 Notify RN    Comment 2 Document in Chart   Glucose, capillary     Status: Abnormal   Collection Time: 11/05/23  6:09 AM  Result Value Ref Range   Glucose-Capillary 157 (H) 70 - 99 mg/dL    Comment: Glucose reference range applies only to samples taken after fasting for at least 8 hours.   Comment 1 Notify RN    Comment 2 Document in Chart   Glucose, capillary     Status: Abnormal   Collection Time: 11/05/23 11:24 AM  Result Value Ref Range   Glucose-Capillary 100 (H) 70 - 99 mg/dL    Comment: Glucose reference  range applies only to samples taken after fasting for at least 8 hours.   No results found.     Blood pressure (!) 153/67, pulse 63, temperature 98 F (36.7 C), temperature source Oral, resp. rate 20, height 5' 11 (1.803 m), weight 71.2 kg, SpO2 100%.  Medical Problem List and Plan: 1. Functional deficits secondary to ***  -patient may *** shower  -ELOS/Goals: *** 2.  Antithrombotics: -DVT/anticoagulation:  Pharmaceutical: Lovenox   -antiplatelet therapy: N/A at this time.  3. Pain Management: Tylenol  prn.  4. Mood/Behavior/Sleep: LCSW to follow for evaluation and support.   -antipsychotic agents: N/A 5. Neuropsych/cognition: This patient is not capable of making decisions on his own behalf. 6. Skin/Wound Care: Routine pressure relief measures.  7. Fluids/Electrolytes/Nutrition: Monitor I/O. Check CMET in am  --SCr @ 1.31. Encourage fluid intake.  8. PAF: Monitor HR TID--off coumadin  due to ICH  --family to decide on Aspire trial (Eliquis  v/s ASA) 9. T2DM: HGb A1C- 6.4 and well controlled on diet alone.  --CM diet. Will monitor BS ac/hs for  now 10. HTN: Monitor BP TID. SBP goal , 160. 11. Mild cognitive impairment: Last seen by Neuro 11/22 12. Elevated Lipids: Trig-272. VLDL-54. ON   --On Crestor  40 mg   ***  Sharlet GORMAN Schmitz, PA-C 11/05/2023

## 2023-11-04 NOTE — Plan of Care (Signed)
 Wife at his side and he sat up in the chair for a few hours.  [place] he feels better since he has been up.  No signs of distress.  No complaints.    Problem: Education: Goal: Knowledge of secondary prevention will improve (MUST DOCUMENT ALL) Outcome: Progressing   Problem: Education: Goal: Knowledge of patient specific risk factors will improve (DELETE if not current risk factor) Outcome: Progressing   Problem: Coping: Goal: Will verbalize positive feelings about self Outcome: Progressing   Problem: Nutrition: Goal: Risk of aspiration will decrease Outcome: Progressing   Problem: Nutrition: Goal: Dietary intake will improve Outcome: Progressing   Problem: Metabolic: Goal: Ability to maintain appropriate glucose levels will improve Outcome: Progressing

## 2023-11-05 ENCOUNTER — Other Ambulatory Visit: Payer: Self-pay

## 2023-11-05 ENCOUNTER — Inpatient Hospital Stay (HOSPITAL_COMMUNITY)
Admission: AD | Admit: 2023-11-05 | Discharge: 2023-11-16 | DRG: 057 | Disposition: A | Discharge status: Home Health | Source: Intra-hospital | Attending: Physical Medicine and Rehabilitation | Admitting: Physical Medicine and Rehabilitation

## 2023-11-05 ENCOUNTER — Encounter (HOSPITAL_COMMUNITY): Payer: Self-pay | Admitting: Physical Medicine and Rehabilitation

## 2023-11-05 DIAGNOSIS — I129 Hypertensive chronic kidney disease with stage 1 through stage 4 chronic kidney disease, or unspecified chronic kidney disease: Secondary | ICD-10-CM | POA: Diagnosis not present

## 2023-11-05 DIAGNOSIS — Z860100 Personal history of colon polyps, unspecified: Secondary | ICD-10-CM | POA: Diagnosis not present

## 2023-11-05 DIAGNOSIS — R5381 Other malaise: Secondary | ICD-10-CM | POA: Diagnosis not present

## 2023-11-05 DIAGNOSIS — I77810 Thoracic aortic ectasia: Secondary | ICD-10-CM | POA: Diagnosis not present

## 2023-11-05 DIAGNOSIS — Z79899 Other long term (current) drug therapy: Secondary | ICD-10-CM

## 2023-11-05 DIAGNOSIS — I959 Hypotension, unspecified: Secondary | ICD-10-CM | POA: Diagnosis not present

## 2023-11-05 DIAGNOSIS — Z8249 Family history of ischemic heart disease and other diseases of the circulatory system: Secondary | ICD-10-CM

## 2023-11-05 DIAGNOSIS — I69151 Hemiplegia and hemiparesis following nontraumatic intracerebral hemorrhage affecting right dominant side: Secondary | ICD-10-CM | POA: Diagnosis not present

## 2023-11-05 DIAGNOSIS — R451 Restlessness and agitation: Secondary | ICD-10-CM | POA: Diagnosis not present

## 2023-11-05 DIAGNOSIS — I612 Nontraumatic intracerebral hemorrhage in hemisphere, unspecified: Secondary | ICD-10-CM | POA: Diagnosis not present

## 2023-11-05 DIAGNOSIS — I771 Stricture of artery: Secondary | ICD-10-CM | POA: Diagnosis not present

## 2023-11-05 DIAGNOSIS — E781 Pure hyperglyceridemia: Secondary | ICD-10-CM | POA: Diagnosis not present

## 2023-11-05 DIAGNOSIS — E1151 Type 2 diabetes mellitus with diabetic peripheral angiopathy without gangrene: Secondary | ICD-10-CM | POA: Diagnosis not present

## 2023-11-05 DIAGNOSIS — R260 Ataxic gait: Secondary | ICD-10-CM | POA: Diagnosis present

## 2023-11-05 DIAGNOSIS — I6523 Occlusion and stenosis of bilateral carotid arteries: Secondary | ICD-10-CM | POA: Diagnosis present

## 2023-11-05 DIAGNOSIS — E1122 Type 2 diabetes mellitus with diabetic chronic kidney disease: Secondary | ICD-10-CM | POA: Diagnosis not present

## 2023-11-05 DIAGNOSIS — I619 Nontraumatic intracerebral hemorrhage, unspecified: Principal | ICD-10-CM | POA: Diagnosis present

## 2023-11-05 DIAGNOSIS — I69192 Facial weakness following nontraumatic intracerebral hemorrhage: Secondary | ICD-10-CM

## 2023-11-05 DIAGNOSIS — Z8419 Family history of other disorders of kidney and ureter: Secondary | ICD-10-CM

## 2023-11-05 DIAGNOSIS — N179 Acute kidney failure, unspecified: Secondary | ICD-10-CM | POA: Diagnosis not present

## 2023-11-05 DIAGNOSIS — G2581 Restless legs syndrome: Secondary | ICD-10-CM | POA: Diagnosis not present

## 2023-11-05 DIAGNOSIS — Z87891 Personal history of nicotine dependence: Secondary | ICD-10-CM

## 2023-11-05 DIAGNOSIS — R001 Bradycardia, unspecified: Secondary | ICD-10-CM | POA: Diagnosis not present

## 2023-11-05 DIAGNOSIS — M1990 Unspecified osteoarthritis, unspecified site: Secondary | ICD-10-CM | POA: Diagnosis present

## 2023-11-05 DIAGNOSIS — I480 Paroxysmal atrial fibrillation: Secondary | ICD-10-CM | POA: Diagnosis present

## 2023-11-05 DIAGNOSIS — I613 Nontraumatic intracerebral hemorrhage in brain stem: Secondary | ICD-10-CM | POA: Diagnosis not present

## 2023-11-05 DIAGNOSIS — Z7901 Long term (current) use of anticoagulants: Secondary | ICD-10-CM | POA: Diagnosis not present

## 2023-11-05 DIAGNOSIS — E785 Hyperlipidemia, unspecified: Secondary | ICD-10-CM | POA: Diagnosis not present

## 2023-11-05 DIAGNOSIS — G8929 Other chronic pain: Secondary | ICD-10-CM | POA: Diagnosis present

## 2023-11-05 DIAGNOSIS — I69128 Other speech and language deficits following nontraumatic intracerebral hemorrhage: Secondary | ICD-10-CM

## 2023-11-05 DIAGNOSIS — N1832 Chronic kidney disease, stage 3b: Secondary | ICD-10-CM | POA: Diagnosis present

## 2023-11-05 DIAGNOSIS — G8194 Hemiplegia, unspecified affecting left nondominant side: Secondary | ICD-10-CM | POA: Diagnosis not present

## 2023-11-05 DIAGNOSIS — I610 Nontraumatic intracerebral hemorrhage in hemisphere, subcortical: Secondary | ICD-10-CM | POA: Diagnosis not present

## 2023-11-05 DIAGNOSIS — Z91048 Other nonmedicinal substance allergy status: Secondary | ICD-10-CM

## 2023-11-05 DIAGNOSIS — E119 Type 2 diabetes mellitus without complications: Secondary | ICD-10-CM

## 2023-11-05 DIAGNOSIS — J449 Chronic obstructive pulmonary disease, unspecified: Secondary | ICD-10-CM | POA: Diagnosis present

## 2023-11-05 DIAGNOSIS — G479 Sleep disorder, unspecified: Secondary | ICD-10-CM | POA: Diagnosis not present

## 2023-11-05 DIAGNOSIS — R4189 Other symptoms and signs involving cognitive functions and awareness: Secondary | ICD-10-CM | POA: Diagnosis present

## 2023-11-05 DIAGNOSIS — M25572 Pain in left ankle and joints of left foot: Secondary | ICD-10-CM | POA: Diagnosis present

## 2023-11-05 DIAGNOSIS — I10 Essential (primary) hypertension: Secondary | ICD-10-CM | POA: Diagnosis present

## 2023-11-05 DIAGNOSIS — K5900 Constipation, unspecified: Secondary | ICD-10-CM | POA: Diagnosis not present

## 2023-11-05 DIAGNOSIS — R059 Cough, unspecified: Secondary | ICD-10-CM | POA: Diagnosis not present

## 2023-11-05 DIAGNOSIS — Z825 Family history of asthma and other chronic lower respiratory diseases: Secondary | ICD-10-CM

## 2023-11-05 DIAGNOSIS — R413 Other amnesia: Secondary | ICD-10-CM | POA: Diagnosis present

## 2023-11-05 DIAGNOSIS — Z7982 Long term (current) use of aspirin: Secondary | ICD-10-CM

## 2023-11-05 LAB — GLUCOSE, CAPILLARY
Glucose-Capillary: 157 mg/dL — ABNORMAL HIGH (ref 70–99)
Glucose-Capillary: 100 mg/dL — ABNORMAL HIGH (ref 70–99)
Glucose-Capillary: 87 mg/dL (ref 70–99)
Glucose-Capillary: 178 mg/dL — ABNORMAL HIGH (ref 70–99)

## 2023-11-05 MED ORDER — SENNOSIDES-DOCUSATE SODIUM 8.6-50 MG PO TABS
2.0000 | ORAL_TABLET | Freq: Every day | ORAL | Status: DC
  Administered 2023-11-09 – 2023-11-16 (×8): 2 via ORAL
  Filled 2023-11-05 (×10): qty 2

## 2023-11-05 MED ORDER — PROCHLORPERAZINE EDISYLATE 10 MG/2ML IJ SOLN: 5.0000 mg | Freq: Four times a day (QID) | INTRAMUSCULAR | Status: DC | PRN

## 2023-11-05 MED ORDER — TRAZODONE HCL 50 MG PO TABS
25.0000 mg | ORAL_TABLET | Freq: Every evening | ORAL | Status: DC | PRN
  Administered 2023-11-07 – 2023-11-14 (×6): 50 mg via ORAL
  Filled 2023-11-05 (×6): qty 1

## 2023-11-05 MED ORDER — PROCHLORPERAZINE MALEATE 5 MG PO TABS: 5.0000 mg | ORAL_TABLET | Freq: Four times a day (QID) | ORAL | Status: DC | PRN

## 2023-11-05 MED ORDER — DIPHENHYDRAMINE HCL 25 MG PO CAPS: 25.0000 mg | ORAL_CAPSULE | Freq: Four times a day (QID) | ORAL | Status: DC | PRN

## 2023-11-05 MED ORDER — FLEET ENEMA RE ENEM: 1.0000 | ENEMA | Freq: Once | RECTAL | Status: DC | PRN

## 2023-11-05 MED ORDER — INSULIN ASPART 100 UNIT/ML IJ SOLN
0.0000 [IU] | Freq: Three times a day (TID) | INTRAMUSCULAR | Status: DC
  Administered 2023-11-07: 1 [IU] via SUBCUTANEOUS

## 2023-11-05 MED ORDER — ROSUVASTATIN CALCIUM 20 MG PO TABS
40.0000 mg | ORAL_TABLET | Freq: Every day | ORAL | Status: DC
  Administered 2023-11-06 – 2023-11-15 (×10): 40 mg via ORAL
  Filled 2023-11-05 (×11): qty 2

## 2023-11-05 MED ORDER — CHLORHEXIDINE GLUCONATE CLOTH 2 % EX PADS: 6.0000 | MEDICATED_PAD | Freq: Every day | CUTANEOUS | Status: DC

## 2023-11-05 MED ORDER — PROCHLORPERAZINE 25 MG RE SUPP: 12.5000 mg | Freq: Four times a day (QID) | RECTAL | Status: DC | PRN

## 2023-11-05 MED ORDER — ACETAMINOPHEN 325 MG PO TABS
325.0000 mg | ORAL_TABLET | ORAL | Status: DC | PRN
  Administered 2023-11-06 – 2023-11-15 (×5): 650 mg via ORAL
  Filled 2023-11-05 (×5): qty 2

## 2023-11-05 MED ORDER — INSULIN ASPART 100 UNIT/ML IJ SOLN: 0.0000 [IU] | Freq: Every day | INTRAMUSCULAR | Status: DC

## 2023-11-05 MED ORDER — NICOTINE POLACRILEX 2 MG MT LOZG: 2.0000 mg | LOZENGE | OROMUCOSAL | Status: DC | PRN

## 2023-11-05 MED ORDER — GUAIFENESIN-DM 100-10 MG/5ML PO SYRP: 5.0000 mL | ORAL_SOLUTION | Freq: Four times a day (QID) | ORAL | Status: DC | PRN

## 2023-11-05 MED ORDER — BISACODYL 10 MG RE SUPP: 10.0000 mg | Freq: Every day | RECTAL | Status: DC | PRN

## 2023-11-05 MED ORDER — ALUM & MAG HYDROXIDE-SIMETH 200-200-20 MG/5ML PO SUSP: 30.0000 mL | ORAL | Status: DC | PRN

## 2023-11-05 MED ORDER — ENOXAPARIN SODIUM 40 MG/0.4ML IJ SOSY
40.0000 mg | PREFILLED_SYRINGE | INTRAMUSCULAR | Status: DC
  Administered 2023-11-06 – 2023-11-16 (×11): 40 mg via SUBCUTANEOUS
  Filled 2023-11-05 (×11): qty 0.4

## 2023-11-05 MED ORDER — ESCITALOPRAM OXALATE 10 MG PO TABS
20.0000 mg | ORAL_TABLET | Freq: Every day | ORAL | Status: DC
  Administered 2023-11-06 – 2023-11-16 (×11): 20 mg via ORAL
  Filled 2023-11-05 (×11): qty 2

## 2023-11-05 NOTE — Care Management Important Message (Signed)
 Important Message  Patient Details  Name: John Woodward. MRN: [id number] Date of Birth: [birth year]   Important Message Given:  Yes - Medicare IM     Claretta Deed 11/05/2023, 3:18 PM

## 2023-11-05 NOTE — Progress Notes (Signed)
 Occupational Therapy Treatment Patient Details Name: John Woodward. MRN: [id number] DOB: Jun 02, 1945 Today's Date: 11/05/2023   History of present illness 78 y.o. male presents to [facility] hospital on 11/01/2023 with gait instability. CT head with findings of small R basal ganglia ICH. PMH includes CAD, PAD, memory loss, HTN, HLD, afib.   OT comments  Patient received in supine and address LUE with squeeze toy. Patient demonstrating good gains with bed mobility, transfers, and LB dressing. Patient performed LUE reaching tasks while standing to address balance and LUE AROM.  Red therapy putty exercises performed to address [facility].  Patient will benefit from intensive inpatient follow-up therapy, >3 hours/day.  Acute OT to continue to follow to address established goals to facilitate DC to next venue of care.        If plan is discharge home, recommend the following:  A little help with walking and/or transfers;A little help with bathing/dressing/bathroom;Assist for transportation;Help with stairs or ramp for entrance   Equipment Recommendations  Other (comment) (defer to next venue of care)    Recommendations for Other Services      Precautions / Restrictions Precautions Precautions: Fall Recall of Precautions/Restrictions: Impaired Restrictions Weight Bearing Restrictions Per Provider Order: No       Mobility Bed Mobility Overal bed mobility: Needs Assistance Bed Mobility: Supine to Sit     Supine to sit: Supervision, HOB elevated     General bed mobility comments: supervision to exit bed on right side    Transfers Overall transfer level: Needs assistance Equipment used: None Transfers: Sit to/from Stand, Bed to chair/wheelchair/BSC Sit to Stand: Contact guard assist           General transfer comment: cues for hand placement     Balance Overall balance assessment: Needs assistance Sitting-balance support: No upper extremity supported, Feet supported Sitting  balance-Leahy Scale: Fair     Standing balance support: No upper extremity supported, During functional activity Standing balance-Leahy Scale: Poor Standing balance comment: able to pull up pants while standing with CGA                           ADL either performed or assessed with clinical judgement   ADL Overall ADL's : Needs assistance/impaired                     Lower Body Dressing: Minimal assistance;Sit to/from stand Lower Body Dressing Details (indicate cue type and reason): assistance for balance while pulling up pants                    Extremity/Trunk Assessment Upper Extremity Assessment LUE Deficits / Details: ROM WFL, difficulty with fine motor tasks LUE Sensation: decreased proprioception;decreased light touch LUE Coordination: decreased fine motor            Vision       Perception     Praxis     Communication Communication Communication: No apparent difficulties   Cognition Arousal: Alert Behavior During Therapy: WFL for tasks assessed/performed Cognition: Cognition impaired     Awareness: Intellectual awareness intact, Online awareness impaired Memory impairment (select all impairments): Short-term memory   Executive functioning impairment (select all impairments): Problem solving, Reasoning OT - Cognition Comments: pleasant and jovial during session                 Following commands: Intact        Cueing   Cueing Techniques: Verbal cues,  Tactile cues  Exercises Exercises: Other exercises Other Exercises Other Exercises: Standing reaching tasks with LUE Other Exercises: red therapy putty exercises to address [facility]    Shoulder Instructions       General Comments      Pertinent Vitals/ Pain       Pain Assessment Pain Assessment: Faces Faces Pain Scale: No hurt  Home Living                                          Prior Functioning/Environment              Frequency  Min  2X/week        Progress Toward Goals  OT Goals(current goals can now be found in the care plan section)  Progress towards OT goals: Progressing toward goals  Acute Rehab OT Goals Patient Stated Goal: to go to rehab OT Goal Formulation: With patient Time For Goal Achievement: 11/17/23 Potential to Achieve Goals: Good ADL Goals Pt Will Perform Grooming: with modified independence;standing Pt Will Perform Upper Body Dressing: with modified independence;sitting Pt Will Perform Lower Body Dressing: with modified independence;sit to/from stand Pt Will Transfer to Toilet: with modified independence;ambulating Pt Will Perform Toileting - Clothing Manipulation and hygiene: with modified independence;sit to/from stand Pt/caregiver will Perform Home Exercise Program: Left upper extremity;With written HEP provided Additional ADL Goal #1: Pt will demonstrate fine motor coordination exercises at independent level for LUE  Plan      Co-evaluation                 AM-PAC OT 6 Clicks Daily Activity     Outcome Measure   Help from another person eating meals?: None Help from another person taking care of personal grooming?: A Little Help from another person toileting, which includes using toliet, bedpan, or urinal?: A Little Help from another person bathing (including washing, rinsing, drying)?: A Little Help from another person to put on and taking off regular upper body clothing?: A Little Help from another person to put on and taking off regular lower body clothing?: A Little 6 Click Score: 19    End of Session Equipment Utilized During Treatment: Gait belt  OT Visit Diagnosis: Unsteadiness on feet (R26.81);Other abnormalities of gait and mobility (R26.89);Ataxia, unspecified (R27.0);Other symptoms and signs involving the nervous system (R29.898)   Activity Tolerance Patient tolerated treatment well   Patient Left in chair;with call bell/phone within reach;with chair alarm  set   Nurse Communication Mobility status;Precautions        Time: 9067-9042 OT Time Calculation (min): 25 min  Charges: OT General Charges $OT Visit: 1 Visit OT Treatments $Therapeutic Exercise: 23-37 mins  John Woodward, OTA Acute Rehabilitation Services  Office [phone]   John Woodward 11/05/2023, 11:41 AM

## 2023-11-05 NOTE — Evaluation (Signed)
 Occupational Therapy Assessment and Plan  Patient Details  Name: John Woodward. MRN: [id number] Date of Birth: February 05, 1945  OT Diagnosis: ataxia, hemiplegia affecting non-dominant side, and muscle weakness (generalized) Rehab Potential: Rehab Potential (ACUTE ONLY): Excellent ELOS: 10-12   Today's Date: 11/06/2023 OT Individual Time: 1045-1200 OT Individual Time Calculation (min): 75 min     Hospital Problem: Principal Problem:   ICH (intracerebral hemorrhage) (HCC)   Past Medical History:  Past Medical History:  Diagnosis Date   Arthritis    Carotid artery disease    Closed fracture dislocation of right elbow    30 years ago -- fell out of theback of a truck   Diabetes mellitus without complication (HCC)    Diverticulosis    Hemorrhoids    Hx of colonic polyps 02/23/2017   Hyperlipidemia    Hypertension    Diagnosed 10 years ago. No on medication. Denies history of CAD.    Memory loss    PAD (peripheral artery disease)    Past Surgical History:  Past Surgical History:  Procedure Laterality Date   OLECRANON BURSECTOMY Right 08/18/2016   Procedure: OLECRANON BURSECTOMY;  Surgeon: Addie Glendia Hacker, MD;  Location: [facility] OR;  Service: Orthopedics;  Laterality: Right;    Assessment & Plan Clinical Impression: Patient is a 78 y.o. year old male with recent admission to the hospital on 11/01/2023 with gait instability. CT head with findings of small R basal ganglia ICH. PMH includes CAD, PAD, memory loss, HTN, HLD, afib.  Patient transferred to CIR on 11/05/2023 .    Patient currently requires min with basic self-care skills secondary to muscle weakness, impaired timing and sequencing, unbalanced muscle activation, ataxia, and decreased coordination, and decreased standing balance, decreased postural control, hemiplegia, and decreased balance strategies.  Prior to hospitalization, patient could complete BADL with independent .  Patient will benefit from skilled intervention  to increase independence with basic self-care skills prior to discharge home with care partner.  Anticipate patient will require 24 hour supervision and follow up outpatient.  OT - End of Session Endurance Deficit: Yes Endurance Deficit Description: rest breaks within BADL tasks OT Assessment Rehab Potential (ACUTE ONLY): Excellent OT Barriers to Discharge: None OT Patient demonstrates impairments in the following area(s): Balance;Cognition;Endurance;Motor;Safety OT Basic ADL's Functional Problem(s): Eating;Grooming;Bathing;Dressing;Toileting OT Advanced ADL's Functional Problem(s): None OT Transfers Functional Problem(s): Toilet;Tub/Shower OT Additional Impairment(s): Fuctional Use of Upper Extremity OT Plan OT Intensity: Minimum of 1-2 x/day, 45 to 90 minutes OT Frequency: 5 out of 7 days OT Duration/Estimated Length of Stay: 10-12 OT Treatment/Interventions: Balance/vestibular training;Cognitive remediation/compensation;Community reintegration;Discharge planning;Disease mangement/prevention;DME/adaptive equipment instruction;Functional electrical stimulation;Functional mobility training;Neuromuscular re-education;Pain management;Patient/family education;Psychosocial support;Self Care/advanced ADL retraining;Skin care/wound managment;Splinting/orthotics;Therapeutic Activities;Therapeutic Exercise;UE/LE Strength taining/ROM;UE/LE Coordination activities;Visual/perceptual remediation/compensation;Wheelchair propulsion/positioning OT Self Feeding Anticipated Outcome(s): Mod I OT Basic Self-Care Anticipated Outcome(s): Mod I/supervision OT Toileting Anticipated Outcome(s): Supervision OT Bathroom Transfers Anticipated Outcome(s): Supervision OT Recommendation Recommendations for Other Services: None Patient destination: Home Follow Up Recommendations: Outpatient OT Equipment Recommended: To be determined Equipment Details: TBD   OT Evaluation Precautions/Restrictions   Precautions Precautions: Fall Recall of Precautions/Restrictions: Impaired Restrictions Weight Bearing Restrictions Per Provider Order: No General   Vital Signs   Pain Pain Assessment Pain Scale: 0-10 Pain Score: 0-No pain Pain Type: Chronic pain Pain Location: Ankle Pain Orientation: Left Home Living/Prior Functioning Home Living Family/patient expects to be discharged to:: Private residence Living Arrangements: Spouse/significant other, Children Available Help at Discharge: Family, Available 24 hours/day Type of Home: House Home Access: Stairs to enter  Entrance Stairs-Number of Steps: 5 Entrance Stairs-Rails: Left Home Layout: One level Bathroom Shower/Tub: Psychologist, counselling, Associate Professor: Yes  Lives With: Spouse, Son IADL History Homemaking Responsibilities: Yes Occupation: Retired 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 Level of Independence: Independent with basic ADLs, Independent with homemaking with ambulation  Able to Take Stairs?: Yes Driving: Yes 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 Baseline Vision/History: 1 Wears glasses (bifoclas) Ability to See in Adequate Light: 0 Adequate Patient Visual Report: No change from baseline Vision Assessment?: No apparent visual deficits Additional Comments: no changes from baseline Perception  Perception: Impaired Perception-Other Comments: mild inattention L visual field and L hemibody Praxis Praxis: Impaired Praxis Impairment Details: Motor planning Cognition Cognition Overall Cognitive Status: Impaired/Different from baseline Arousal/Alertness: Awake/alert Orientation Level: Person;Situation;Place Person: Oriented Place: Oriented Situation: Oriented Memory: Impaired Memory Impairment: Storage deficit;Decreased recall of new information Attention: Sustained;Selective Sustained Attention: Appears intact Selective  Attention: Impaired Selective Attention Impairment: Verbal basic Awareness: Impaired Awareness Impairment: Intellectual impairment Problem Solving: Impaired Safety/Judgment: Impaired Comments: poor awareness of deficits Brief Interview for Mental Status (BIMS) Repetition of Three Words (First Attempt): 3 Temporal Orientation: Year: Correct Temporal Orientation: Month: Accurate within 5 days Temporal Orientation: Day: Incorrect Recall: Sock: Yes, after cueing (something to wear) Recall: Blue: Yes, no cue required Recall: Bed: No, could not recall BIMS Summary Score: 11 Sensation Sensation Light Touch: Impaired Detail Light Touch Impaired Details: Impaired LLE;Impaired LUE;Impaired RLE Proprioception: Impaired Detail Proprioception Impaired Details: Impaired LLE Additional Comments: L LE tingling has been present since back injury 40 years ago Coordination Gross Motor Movements are Fluid and Coordinated: No Fine Motor Movements are Fluid and Coordinated: No Coordination and Movement Description: decreased smoothness and accuracy with L UE Finger Nose Finger Test: dysmetria Heel Shin Test: impaired L worse than R Motor  Motor Motor: Ataxia (hemiparesis) Motor - Skilled Clinical Observations: L hemiparesis  Trunk/Postural Assessment  Cervical Assessment Cervical Assessment: Exceptions to [facility] (forward head posture) Thoracic Assessment Thoracic Assessment: Exceptions to [facility] (B shoulders) Lumbar Assessment Lumbar Assessment: Exceptions to [facility] Postural Control Postural Control: Deficits on evaluation  Balance Balance Balance Assessed: Yes Static Sitting Balance Static Sitting - Balance Support: Feet supported Static Sitting - Level of Assistance: 5: Stand by assistance Dynamic Sitting Balance Dynamic Sitting - Balance Support: Feet supported Dynamic Sitting - Level of Assistance: 5: Stand by assistance;4: Min assist (CGA) Static Standing Balance Static Standing -  Balance Support: During functional activity Static Standing - Level of Assistance: 4: Min assist;5: Stand by assistance Dynamic Standing Balance Dynamic Standing - Balance Support: During functional activity Dynamic Standing - Level of Assistance: 4: Min assist Extremity/Trunk Assessment RUE Assessment RUE Assessment: Within Functional Limits LUE Assessment LUE Assessment: Exceptions to [facility] Strength Comments: L UE 4-/5, defcitis in coordination  Care Tool Care Tool Self Care Eating   Eating Assist Level: Set up assist    Oral Care    Oral Care Assist Level: Contact Guard/Toucning assist    Bathing   Body parts bathed by patient: Right arm;Left arm;Abdomen;Chest;Front perineal area;Buttocks;Right upper leg;Left upper leg;Right lower leg;Left lower leg;Face     Assist Level: Minimal Assistance - Patient > 75%    Upper Body Dressing(including orthotics)   What is the patient wearing?: Pull over shirt   Assist Level: Contact Guard/Touching assist    Lower Body Dressing (excluding footwear)   What is the patient wearing?: Pants Assist for lower body dressing: Minimal Assistance -  Patient > 75%    Putting on/Taking off footwear   What is the patient wearing?: Non-skid slipper socks Assist for footwear: Minimal Assistance - Patient > 75%       Care Tool Toileting Toileting activity   Assist for toileting: Minimal Assistance - Patient > 75%     Care Tool Bed Mobility Roll left and right activity   Roll left and right assist level: Supervision/Verbal cueing    Sit to lying activity   Sit to lying assist level: Supervision/Verbal cueing    Lying to sitting on side of bed activity   Lying to sitting on side of bed assist level: the ability to move from lying on the back to sitting on the side of the bed with no back support.: Supervision/Verbal cueing     Care Tool Transfers Sit to stand transfer   Sit to stand assist level: Minimal Assistance - Patient > 75%     Chair/bed transfer   Chair/bed transfer assist level: Minimal Assistance - Patient > 75%     Toilet transfer   Assist Level: Minimal Assistance - Patient > 75%     Care Tool Cognition  Expression of Ideas and Wants Expression of Ideas and Wants: 3. Some difficulty - exhibits some difficulty with expressing needs and ideas (e.g, some words or finishing thoughts) or speech is not clear  Understanding Verbal and Non-Verbal Content Understanding Verbal and Non-Verbal Content: 3. Usually understands - understands most conversations, but misses some part/intent of message. Requires cues at times to understand   Memory/Recall Ability Memory/Recall Ability : Current season;Staff names and faces;That he or she is in a hospital/hospital unit   Refer to Care Plan for Long Term Goals  SHORT TERM GOAL WEEK 1 OT Short Term Goal 1 (Week 1): Patient will complete 3/3 toileting tasks with CGA OT Short Term Goal 2 (Week 1): Patient will demonstrate improved safety awareness by locking wc breaks prior to stand with min cues OT Short Term Goal 3 (Week 1): Patient will complete LB dressing with CGA.  Recommendations for other services: None    Skilled Therapeutic Intervention OT eval completed addressing rehab process, OT purpose, POC, ELOS, and goals.  Pt ambulated without RW and min HHA. Pt with successful BM with min/CGA for balance during toileting tasks. Overall min/CGA for BADL tasks at shower level. L hand ataxia and coordination deficits, poor safety awareness, decreased awareness of deficits.  Pt left seated in recliner with chair alarm on, call bell in reach, and needs met. See below for further details regarding BADL performance.    ADL ADL Eating: Set up Grooming: Setup Upper Body Bathing: Supervision/safety Lower Body Bathing: Minimal assistance Upper Body Dressing: Contact guard Lower Body Dressing: Minimal assistance Toileting: Minimal assistance Toilet Transfer: Minimal  assistance Walk-In Shower Transfer: Minimal assistance Mobility  Bed Mobility Bed Mobility: Rolling Left;Rolling Right;Supine to Sit;Sit to Supine Rolling Right: Supervision/verbal cueing Rolling Left: Supervision/Verbal cueing Supine to Sit: Supervision/Verbal cueing Sit to Supine: Supervision/Verbal cueing Transfers Sit to Stand: Contact Guard/Touching assist Stand to Sit: Contact Guard/Touching assist   Discharge Criteria: Patient will be discharged from OT if patient refuses treatment 3 consecutive times without medical reason, if treatment goals not met, if there is a change in medical status, if patient makes no progress towards goals or if patient is discharged from hospital.  The above assessment, treatment plan, treatment alternatives and goals were discussed and mutually agreed upon: by patient and by family  Sharyle GORMAN Pert 11/06/2023,  12:35 PM

## 2023-11-05 NOTE — Progress Notes (Signed)
 Inpatient Rehabilitation Admission Medication Review by a Pharmacist   A complete drug regimen review was completed for this patient to identify any potential clinically significant medication issues.   High Risk Drug Classes Is patient taking? Indication by Medication  Antipsychotic Yes Compazine prn N/V  Anticoagulant Yes Lovenox  - VTE ppx  Antibiotic No    Opioid No    Antiplatelet No    Hypoglycemics/insulin  Yes Insulin  - DM   Vasoactive Medication No    Chemotherapy No    Other Yes Trazodone prn sleep Escitalopram  - mood Rosuvastatin  - HLD        Type of Medication Issue Identified Description of Issue Recommendation(s)  Drug Interaction(s) (clinically significant)        Duplicate Therapy        Allergy        No Medication Administration End Date        Incorrect Dose        Additional Drug Therapy Needed        Significant med changes from prior encounter (inform family/care partners about these prior to discharge).      Other            Clinically significant medication issues were identified that warrant physician communication and completion of prescribed/recommended actions by midnight of the next day:  No   Name of provider notified for urgent issues identified:    Provider Method of Notification:        Pharmacist comments: Warfarin on hold for ICH,  Losartan , amlodipine  held for permissive HTN   Time spent performing this drug regimen review (minutes):  20 minutes   Thank you. Rocky Slade, PharmD, BCPS 11/05/2023 6:06 PM

## 2023-11-05 NOTE — TOC Transition Note (Signed)
 Transition of Care [facility]) - Discharge Note   Patient Details  Name: John Woodward. MRN: [id number] Date of Birth: Jun 14, 1945  [facility]) CM/SW Contact:  Andrez JULIANNA George, RN Phone Number: 11/05/2023, 11:54 AM   Clinical Narrative:     Pt is discharging to CIR today. IP care management is signing off.   Final next level of care: IP Rehab Facility Barriers to Discharge: No Barriers Identified   Patient Goals and CMS Choice   CMS Medicare.gov Compare Post Acute Care list provided to:: Patient Choice offered to / list presented to : Patient      Discharge Placement                       Discharge Plan and Services Additional resources added to the After Visit Summary for                                       Social Drivers of Health (SDOH) Interventions SDOH Screenings   Food Insecurity: No Food Insecurity (11/02/2023)  Housing: Low Risk  (11/02/2023)  Transportation Needs: No Transportation Needs (11/02/2023)  Utilities: Not At Risk (11/02/2023)  Alcohol Screen: Low Risk  (09/14/2023)  Depression (PHQ2-9): Low Risk  (09/14/2023)  Financial Resource Strain: Low Risk  (09/14/2023)  Physical Activity: Inactive (09/14/2023)  Social Connections: Moderately Isolated (11/02/2023)  Stress: No Stress Concern Present (09/14/2023)  Tobacco Use: Medium Risk (11/01/2023)  Health Literacy: Adequate Health Literacy (09/14/2023)     Readmission Risk Interventions     No data to display

## 2023-11-05 NOTE — Progress Notes (Signed)
 Urbano Albright, MD  Physician Physical Medicine and Rehabilitation   Consult Note    Addendum   Date of Service: 11/03/2023 10:24 AM  Related encounter: ED to Hosp-Admission (Discharged) from 11/01/2023 in [place] [facility]   Expand All Collapse All  Show:Clear all [x] Written[x] Templated[x] Copied  Added by: [x] Urbano Albright, MD  [] Hover for details          Physical Medicine and Rehabilitation Consult Reason for Consult: Rehab Referring Physician: Dr. Rosemarie      HPI: John Woodward. is a 78 y.o. male with past medical history of diabetes mellitus, arthritis, CAD, hypertension, PAD, A-fib on Coumadin  who presented to the hospital after he was noted to have weakness, dizziness and balance issues. He also had left facial droop and slurred speech. On the day prior to admission patient had a fall but not noted to have head injury.  CT head 10/6 showed right intraparenchymal hemorrhage centered in the posterior limb of the internal capsule.  INR 3.2 reversed with Kcentra. Hemorrhage felt to be hypertensive and warfarin related.  MRI of the brain showed unchanged right basal ganglia intraparenchymal hemorrhage and multiple chronic microhemorrhages in pons, cerebellum, thalami and supratentorial white matter.  CT of the brain positive for advanced atherosclerosis and evidence of small intracranial aneurysms, high-grade bilateral ICA origin stenosis, distal left ICA saccular aneurysm versus infundibulum and anterior communicating artery aneurysm.  Patient was seen by SLP for cognitive deficits and speech.  Patient seen by PT requiring mod assist for gait.  Acute inpatient rehabilitation recommended.   Per chart review patient lives in a home with 5 steps to enter.  Home is 1 level and family can assist 24 hours a day after discharge.  Patient lives with the spouse.   Home: Home Living Family/patient expects to be discharged to:: Private residence Living  Arrangements: Spouse/significant other, Children Available Help at Discharge: Family, Available 24 hours/day Type of Home: House Home Access: Stairs to enter [employer] of Steps: 5 Entrance Stairs-Rails: Right, Left Home Layout: One level Bathroom Shower/Tub: Psychologist, counselling, Engineer, manufacturing systems: Standard Home Equipment: Agricultural consultant (2 wheels), [employer] - single point, Information systems manager  Lives With: Spouse  Functional History: Prior Function Prior Level of Function : Independent/Modified Independent, Driving Mobility Comments: ambulatory without DME, enjoys working on old cars Functional Status:  Mobility: Bed Mobility Overal bed mobility: Needs Assistance Bed Mobility: Supine to Sit Supine to sit: Supervision Transfers Overall transfer level: Needs assistance Equipment used: None Transfers: Sit to/from Stand Sit to Stand: Contact guard assist Ambulation/Gait Ambulation/Gait assistance: Mod assist Gait Distance (Feet): 300 Feet Assistive device: None Gait Pattern/deviations: Step-through pattern, Staggering left General Gait Details: pt with multiple leftward losses of balance, often drifting to left side and correcting with stepping strategy but continuing to drift left and eventually needing physical assistance to correct Gait velocity: reduced Gait velocity interpretation: 1.31 - 2.62 ft/sec, indicative of limited community ambulator Stairs: Yes Stairs assistance: Min assist Stair Management: One rail Right, Step to pattern, Forwards Number of Stairs: 5   ADL:   Cognition: Cognition Overall Cognitive Status: Impaired/Different from baseline Orientation Level: Oriented X4 Attention: Sustained, Selective Sustained Attention: Appears intact Selective Attention: Impaired Selective Attention Impairment: Verbal basic Memory: Impaired Memory Impairment: Storage deficit, Decreased recall of new information Awareness: Impaired Awareness Impairment:  Intellectual impairment Safety/Judgment: Impaired Cognition Arousal: Alert Behavior During Therapy: WFL for tasks assessed/performed Overall Cognitive Status: Impaired/Different from baseline     Review of Systems  Constitutional:  Negative for chills and fever.  HENT:  Negative for hearing loss.   Eyes:  Negative for blurred vision and double vision.  Respiratory:  Negative for cough and shortness of breath.   Cardiovascular:  Negative for chest pain and leg swelling.  Gastrointestinal:  Negative for abdominal pain, blood in stool, constipation, diarrhea, nausea and vomiting.  Genitourinary: Negative.   Musculoskeletal:  Positive for back pain and joint pain (ankle surgery about 40 years ago). Negative for neck pain.  Skin:  Negative for itching and rash.  Neurological:  Positive for speech change and weakness. Negative for tingling, sensory change (chronic L ankle numbness) and headaches.  Psychiatric/Behavioral:  Negative for depression. The patient has insomnia.        Past Medical History:  Diagnosis Date   Arthritis     Carotid artery disease     Closed fracture dislocation of right elbow      30 years ago -- fell out of theback of a truck   Diabetes mellitus without complication (HCC)     Hx of colonic polyps 02/23/2017   Hyperlipidemia     Hypertension      Diagnosed 10 years ago. No on medication. Denies history of CAD.    Memory loss     PAD (peripheral artery disease)               Past Surgical History:  Procedure Laterality Date   OLECRANON BURSECTOMY Right 08/18/2016    Procedure: OLECRANON BURSECTOMY;  Surgeon: Addie Glendia Hacker, MD;  Location: [facility] OR;  Service: Orthopedics;  Laterality: Right;             Family History  Problem Relation Age of Onset   Hypertension Mother     Dementia Mother          42s   Transient ischemic attack Mother          14s   Kidney disease Father     COPD Father     Hypertension Brother     Lung cancer Maternal Aunt      Alcohol abuse Maternal Grandfather     Mental illness Paternal Grandmother     Hypertension Daughter     Healthy Son     Learning disabilities Son     Colon cancer Neg Hx     Colon polyps Neg Hx     Esophageal cancer Neg Hx     Rectal cancer Neg Hx     Stomach cancer Neg Hx          Social History:  reports that he quit smoking about 57 years ago. His smoking use included cigarettes. He started smoking about 16 months ago. He has never used smokeless tobacco. He reports that he does not drink alcohol and does not use drugs. Allergies:  Allergies       Allergies  Allergen Reactions   Antibacterial Hand Soap [Triclosan] Other (See Comments)      Caused water blisters            Medications Prior to Admission  Medication Sig Dispense Refill   amLODipine  (NORVASC ) 10 MG tablet TAKE 1 TABLET BY MOUTH EVERY DAY (Patient taking differently: Take 5 mg by mouth daily. Take one-half tablet (5mg ) by mouth every afternoon.) 90 tablet 1   aspirin  EC 81 MG tablet Take 1 tablet (81 mg total) by mouth daily. Swallow whole.       escitalopram  (LEXAPRO ) 20 MG tablet Take 1 tablet (20  mg total) by mouth daily. 90 tablet 3   losartan  (COZAAR ) 50 MG tablet TAKE 1 TABLET BY MOUTH EVERY DAY 90 tablet 1   nicotine  polacrilex (COMMIT) 2 MG lozenge Take 2 mg by mouth 4 (four) times daily as needed for smoking cessation.       rosuvastatin  (CRESTOR ) 40 MG tablet TAKE 1 TABLET BY MOUTH EVERY DAY 90 tablet 2   warfarin (COUMADIN ) 5 MG tablet Take 1-2 tablets daily or as prescribed by coumadin  clinic (Patient taking differently: Take 2.5-5 mg by mouth daily in the afternoon. Taken one tablet (5mg ) by mouth every day except Wednesday.  ON WEDNESDAY ONLY, take one-half tablet (2.5mg ) as currently prescribed by coumadin  clinic.) 90 tablet 1            Blood pressure (!) 148/77, pulse (!) 51, temperature 97.9 F (36.6 C), temperature source Oral, resp. rate 18, height 5' 11 (1.803 m), weight 71.2 kg, SpO2  96%. Physical Exam   General: No apparent distress HEENT: Head is normocephalic, atraumatic, sclera anicteric, oral mucosa pink and moist Neck: Supple without JVD or lymphadenopathy Heart: Reg rate and rhythm. No murmurs rubs or gallops Chest: CTA bilaterally without wheezes, rales, or rhonchi; no distress Abdomen: Soft, non-tender, non-distended, bowel sounds positive. Extremities: No clubbing, cyanosis, or edema.  Psych: Pt's affect is appropriate. Pt is cooperative. Very pleasant Skin: Clean and intact without signs of breakdown Neuro:     Mental Status: AAOx3, memory intact, fund of knowledge appropriate Speech/Languate: Naming and repetition intact, dysarthria, follows simple commands +recalls 0/3 words about 5 min later CRANIAL NERVES: II: PERRL. Visual fields full III, IV, VI: EOM intact, no gaze preference or deviation V: normal sensation bilaterally VII: Slight left facial droop VIII: a little HOH IX, X: normal palatal elevation XI: 5/5 head turn and 5/5 shoulder shrug bilaterally XII: Tongue midline     MOTOR: RUE: 5/5 Deltoid, 5/5 Biceps, 5/5 Triceps,5/5 Grip LUE: 4+/5 Deltoid, 4+/5 Biceps, 5/5 Triceps, 5/5 Grip RLE: HF 5/5, KE 5/5, ADF 5/5, APF 5/5 LLE: HF 4/5, KE 4+/5, ADF 5/5, APF 5/5     REFLEXES: No ankle clonus   SENSORY: Altered in distal LUE and LLE- reports chronic   Coordination:  finger to nose altered on the left   Musculoskeletal: hammertoe 2nd toe left     Lab Results Last 24 Hours       Results for orders placed or performed during the hospital encounter of 11/01/23 (from the past 24 hours)  Glucose, capillary     Status: Abnormal    Collection Time: 11/02/23 12:17 PM  Result Value Ref Range    Glucose-Capillary 133 (H) 70 - 99 mg/dL  Glucose, capillary     Status: Abnormal    Collection Time: 11/02/23  5:04 PM  Result Value Ref Range    Glucose-Capillary 111 (H) 70 - 99 mg/dL  Glucose, capillary     Status: Abnormal    Collection  Time: 11/02/23  9:30 PM  Result Value Ref Range    Glucose-Capillary 161 (H) 70 - 99 mg/dL  Protime-INR     Status: None    Collection Time: 11/03/23  2:48 AM  Result Value Ref Range    Prothrombin Time 14.7 11.4 - 15.2 seconds    INR 1.1 0.8 - 1.2  Glucose, capillary     Status: Abnormal    Collection Time: 11/03/23  7:23 AM  Result Value Ref Range    Glucose-Capillary 105 (H) 70 - 99 mg/dL  Imaging Results (Last 48 hours)  CT HEAD WO CONTRAST ( ) Result Date: 11/03/2023 EXAM: CT HEAD WITHOUT CONTRAST 11/03/2023 06:26:37 AM TECHNIQUE: CT of the head was performed without the administration of intravenous contrast. Automated exposure control, iterative reconstruction, and/or weight based adjustment of the mA/kV was utilized to reduce the radiation dose to as low as reasonably achievable. COMPARISON: Head CT and brain MRI dated 11/01/2023. CLINICAL HISTORY: 78 year old male with stroke, follow up. Right thalamic/posterior limb internal capsule hemorrhage 2 days ago. FINDINGS: BRAIN AND VENTRICLES: Oval hyperdense hemorrhage at the anterior and lateral right thalamus, likely involving some of the posterior limb right internal capsule, now measures 11 x 15 x 16 mm. Estimated volume: 1.4 mL. This has not significantly changed since presentation. No intraventricular or extra axial extension of blood. No significant regional mass effect. Elsewhere stable gray white matter differentiation with advanced chronic small vessel disease changes in the deep white matter and deep gray matter nuclei. No evidence of acute infarct. No hydrocephalus. No midline shift. ORBITS: No acute abnormality. SINUSES: No acute abnormality. SOFT TISSUES AND SKULL: No acute soft tissue abnormality. No skull fracture. IMPRESSION: 1. Stable right thalamic/posterior limb internal capsule hemorrhage (estimated volume 1.4 mL), without significant regional mass effect or intraventricular/extra-axial extension. 2. Underlying  Advanced chronic small vessel disease. Electronically signed by: Helayne Hurst MD 11/03/2023 06:44 AM EDT RP Workstation: [id number]    CT ANGIO HEAD NECK W WO CM Result Date: 11/02/2023 EXAM: CTA HEAD AND NECK WITH AND WITHOUT 11/02/2023 04:32:40 AM TECHNIQUE: CTA of the head and neck was performed with and without the administration of 75 mL of iohexol  (OMNIPAQUE ) 350 MG/ML injection. Multiplanar 2D and/or 3D reformatted images are provided for review. Automated exposure control, iterative reconstruction, and/or weight based adjustment of the mA/kV was utilized to reduce the radiation dose to as low as reasonably achievable. Stenosis of the internal carotid arteries measured using NASCET criteria. COMPARISON: Brain MRI yesterday, head CT yesterday, and earlier. CLINICAL HISTORY: 78 year old male with acute neuro deficit, stroke suspected. FINDINGS: CTA NECK: AORTIC ARCH AND ARCH VESSELS: Partially visible advanced aortic arch atherosclerosis. Arch is not completely visible, probable 3 vessel arch configuration. Proximal subclavian artery atherosclerosis without significant stenosis. No dissection or arterial injury. CERVICAL CAROTID ARTERIES: Mild right CCA atherosclerosis without stenosis. Bulky soft and calcified plaque at the right ICA origin results in narrowing with 80 percent stenosis. Mild right ICA atherosclerosis below the skull base. Patent left CCA with atherosclerosis but no significant stenosis. Bulky soft and calcified plaque at the left ICA origin with 76 percent stenosis. Left ICA remains patent with minor calcified plaque below the skull base. No dissection or arterial injury. CERVICAL VERTEBRAL ARTERIES: Right vertebral artery is patent, mildly dominant, and mildly tortuous to the skull base with mild plaque and no stenosis. Left vertebral artery origin soft and calcified plaque resulting in mild stenosis. Nondominant left vertebral artery is patent with mild additional plaque to the skull base.  No dissection or arterial injury. LUNGS AND MEDIASTINUM: Centrilobular Emphysema and apical lung scarring in the visible upper chest. SOFT TISSUES: Nonvascular neck soft tissue spaces are within normal limits. BONES: Largely absent dentition. Advanced chronic cervical spine degeneration with degenerative appearing interbody ankylosis at C4-C5, bulky disc and endplate degeneration elsewhere including vacuum disc at C5-C6. No acute osseous abnormality. CTA HEAD: ANTERIOR CIRCULATION: Both ICA siphons are patent. Left siphon moderate calcified plaque, right siphon similar moderate calcified plaque. Both ICA siphons also are tortuous; only mild siphon stenosis  results. There is a distal left ICA 4 x 2 mm triangular aneurysm vs infundibulum of a diminutive left posterior communicating artery. Patent carotid terminus. Ectatic anterior communicating artery and anteriorly directed 3 mm anterior communicating artery aneurysm on series 5 image 95 and series 9 image 89. Tortuous bilateral ACA branches are otherwise within normal limits. Left MCA M1 segment is patent but with mild to moderate distal M1 irregularity and stenosis on series 11 image 46. Left MCA trifurcation, is ectatic as seen on series 10 image 21. No discrete saccular aneurysm there. Contralateral right MCA M1 segment is patent with mild ectasia. Right MCA bifurcation is patent without stenosis. Bilateral MCA branches are patent without stenosis. POSTERIOR CIRCULATION: Distal vertebral arteries and vertebrobasilar junction are patent without stenosis. Both PICA origins are patent. Right vertebral V4 segment is dominant with mild to moderate calcified plaque. Patent basilar artery without stenosis. Patent SCA and PCA origins. Diminutive or absent posterior communicating arteries. Left PCA branches are patent with up to moderate distal P2 segment irregularity and stenosis on series 12 image 22. Right PCA branches are patent with mild to moderate distal P2 segment  irregularity on series 12 image 16. OTHER: Major dural venous sinuses appear grossly patent with early intracranial venous contrast timing. No CTA spot sign of the right deep gray matter hyperdense hemorrhage; no abnormal vascularity there. IMPRESSION: 1. Positive for advanced atherosclerosis and evidence of small intracranial aneurysms (see #3). Negative for large vessel occlusion or CTA spot sign associated with small acute intra-axial hemorrhage 2. High grade Bilateral ICA origin stenosis: 80 percent stenosis on the right, 76 percent stenosis on the left. 3. 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 Electronically signed by: Helayne Hurst MD 11/02/2023 05:56 AM EDT RP Workstation: [id number]    MR BRAIN WO CONTRAST Result Date: 11/01/2023 EXAM: MRI BRAIN WITHOUT CONTRAST 11/01/2023 10:08:51 PM TECHNIQUE: Multiplanar multisequence MRI of the head/brain was performed without the administration of intravenous contrast. COMPARISON: 12/04/2019 and 11/01/2023. CLINICAL HISTORY: Neuro deficit, acute, stroke suspected. FINDINGS: BRAIN AND VENTRICLES: No acute infarct. Intraparenchymal hemorrhage centered at the right basal ganglia, unchanged in size. Multiple chronic micro hemorrhages in the pons and cerebellum, as well as within the thalami and supratentorial white matter. Early confluent hyperintense T2-weighted signal within the cerebral white matter, most commonly due to chronic small vessel disease. No mass. No midline shift. No hydrocephalus. The sella is unremarkable. Normal flow voids. ORBITS: No acute abnormality. SINUSES AND MASTOIDS: No acute abnormality. BONES AND SOFT TISSUES: Normal marrow signal. No acute soft tissue abnormality. IMPRESSION: 1. Unchanged right basal ganglia intraparenchymal hemorrhage. 2. Multiple chronic microhemorrhages in the pons, cerebellum, thalami, and supratentorial white matter. 3. Early confluent  chronic small vessel disease. Electronically signed by: Franky Stanford MD 11/01/2023 10:18 PM EDT RP Workstation: [id number]    CT Head Wo Contrast Result Date: 11/01/2023 CLINICAL DATA:  Neuro deficit, acute, stroke suspected EXAM: CT HEAD WITHOUT CONTRAST TECHNIQUE: Contiguous axial images were obtained from the base of the skull through the vertex without intravenous contrast. RADIATION DOSE REDUCTION: This exam was performed according to the departmental dose-optimization program which includes automated exposure control, adjustment of the mA and/or kV according to patient size and/or use of iterative reconstruction technique. COMPARISON:  MR head 12/04/2019 FINDINGS: Brain: No evidence of large-territorial acute infarction. Acute, at least 1.8 cm, right intraparenchymal hemorrhage centered in the posterior limb of the internal capsule. No mass lesion. No  extra-axial collection. No mass effect or midline shift. No hydrocephalus. Basilar cisterns are patent. Vascular: No hyperdense vessel. Ca Atherosclerotic calcifications are present within the cavernous internal carotid and vertebral arteries. Skull: No acute fracture or focal lesion. Sinuses/Orbits: Paranasal sinuses and mastoid air cells are clear. The orbits are unremarkable. Other: None. IMPRESSION: Acute, at least 1.8 cm, right intraparenchymal hemorrhage centered in the posterior limb of the internal capsule. These results were called by telephone at the time of interpretation on 11/01/2023 at 7:07 pm to provider KEVIN STEINL , who verbally acknowledged these results. Electronically Signed   By: Morgane  Naveau M.D.   On: 11/01/2023 19:12       Assessment/Plan: Diagnosis: Intraparenchymal Hemorrhage of the right basal ganglia  Does the need for close, 24 hr/day medical supervision in concert with the patient's rehab needs make it unreasonable for this patient to be served in a less intensive setting? Yes Co-Morbidities requiring  supervision/potential complications:  - A-fib on chronic anticoagulation, hypertension, hyperlipidemia, diabetes mellitus type 2, coronary artery disease, history of stroke/TIA Due to bladder management, bowel management, safety, skin/wound care, disease management, medication administration, pain management, and patient education, does the patient require 24 hr/day rehab nursing? Yes Does the patient require coordinated care of a physician, rehab nurse, therapy disciplines of PT, OT, SLP to address physical and functional deficits in the context of the above medical diagnosis(es)? Yes Addressing deficits in the following areas: balance, endurance, locomotion, strength, transferring, bowel/bladder control, bathing, dressing, feeding, grooming, toileting, cognition, speech, language, swallowing, and psychosocial support Can the patient actively participate in an intensive therapy program of at least 3 hrs of therapy per day at least 5 days per week? Yes The potential for patient to make measurable gains while on inpatient rehab is excellent Anticipated functional outcomes upon discharge from inpatient rehab are modified independent and supervision  with PT, modified independent and supervision with OT, modified independent and supervision with SLP. Estimated rehab length of stay to reach the above functional goals is: 7 to 10 days Anticipated discharge destination: Home Overall Rehab/Functional Prognosis: excellent   POST ACUTE RECOMMENDATIONS: This patient's condition is appropriate for continued rehabilitative care in the following setting: CIR Patient has agreed to participate in recommended program. Yes Note that insurance prior authorization may be required for reimbursement for recommended care.   Comment: I think patient would be a great candidate for CIR. Rehab admissions coordinator to f/u.          I have personally performed a face to face diagnostic evaluation of this patient.  Additionally, I have examined the patient's medical record including any pertinent labs and radiographic images.     Thanks,   Murray Collier, MD 11/03/2023       Revision History  Routing History

## 2023-11-05 NOTE — Inpatient Diabetes Management (Signed)
  Inpatient Rehabilitation Admissions Coordinator   I have insurance approval and CIR bed to admit him to today. Acute team and TOC made aware. Dr Rosemarie in agreement. I will make the arrangements.  Heron Leavell, RN, MSN Rehab Admissions Coordinator [phone] 11/05/2023 11:23 AM

## 2023-11-05 NOTE — Discharge Summary (Addendum)
 Stroke Discharge Summary  Patient ID: John Woodward   MRN: [id number]      DOB: 03-02-1945  Date of Admission: 11/01/2023 Date of Discharge: 11/05/2023  Attending Physician:  Stroke, Md, MD Consultant(s):    pulmonary/intensive care  Patient's PCP:  Mahlon Comer BRAVO, MD  DISCHARGE PRIMARY DIAGNOSIS: Intraparenchymal hemorrhage of R basal ganglia  Patient Active Problem List   Diagnosis Date Noted   ICH (intracerebral hemorrhage) (HCC) Cytotoxic cerebral edema Anticoagulation related brain hemorrhage-eliquis  11/01/2023   Sleep disturbance 10/10/2023   Atrial fibrillation (HCC) 08/30/2023   Murmur 05/25/2023   PAD (peripheral artery disease) 11/20/2022   Bilateral carotid artery stenosis 11/20/2022   Pneumonia due to COVID-19 virus 06/29/2022   Transient hypotension 06/29/2022   Chronic anticoagulation 06/29/2022   Acute kidney injury superimposed on chronic kidney disease 06/29/2022   Hypokalemia 06/29/2022   History of CVA (cerebrovascular accident) 06/29/2022   Depression, recurrent 01/08/2022   Well controlled diabetes mellitus (HCC) 06/10/2021   Memory deficit 06/10/2021   Paroxysmal atrial fibrillation (HCC) 12/03/2019   Pulmonary emphysema (HCC) 09/22/2018   Hyperlipidemia associated with type 2 diabetes mellitus (HCC) 10/02/2016   Annual physical exam 09/16/2016   Prostate cancer screening 09/16/2016   Tobacco abuse disorder 08/11/2016   Essential hypertension 08/11/2016     Allergies as of 11/05/2023       Reactions   Antibacterial Hand Soap [triclosan] Other (See Comments)   Caused water blisters        Medication List     PAUSE taking these medications    amLODipine  10 MG tablet Wait to take this until your doctor or other care provider tells you to start again. Commonly known as: NORVASC  TAKE 1 TABLET BY MOUTH EVERY DAY What changed:  how much to take additional instructions   losartan  50 MG tablet Wait to take this until your  doctor or other care provider tells you to start again. Commonly known as: COZAAR  TAKE 1 TABLET BY MOUTH EVERY DAY   warfarin 5 MG tablet Wait to take this until your doctor or other care provider tells you to start again. Commonly known as: COUMADIN  Take as directed. If you are unsure how to take this medication, talk to your nurse or doctor. Original instructions: Take 1-2 tablets daily or as prescribed by coumadin  clinic What changed:  how much to take how to take this when to take this additional instructions       STOP taking these medications    aspirin  EC 81 MG tablet       TAKE these medications    escitalopram  20 MG tablet Commonly known as: LEXAPRO  Take 1 tablet (20 mg total) by mouth daily.   nicotine  polacrilex 2 MG lozenge Commonly known as: COMMIT Take 2 mg by mouth 4 (four) times daily as needed for smoking cessation.   rosuvastatin  40 MG tablet Commonly known as: CRESTOR  TAKE 1 TABLET BY MOUTH EVERY DAY        LABORATORY STUDIES CBC    Component Value Date/Time   WBC 4.9 11/01/2023 1800   RBC 4.74 11/01/2023 1800   HGB 14.9 11/01/2023 1800   HGB 14.3 11/10/2022 1305   HCT 45.5 11/01/2023 1800   HCT 43.4 11/10/2022 1305   PLT 212 11/01/2023 1800   MCV 96.0 11/01/2023 1800   MCH 31.4 11/01/2023 1800   MCHC 32.7 11/01/2023 1800   RDW 13.0 11/01/2023 1800   LYMPHSABS 1.1 05/26/2023 1114   MONOABS  0.3 05/26/2023 1114   EOSABS 0.3 05/26/2023 1114   BASOSABS 0.1 05/26/2023 1114   CMP    Component Value Date/Time   NA 141 11/01/2023 1800   NA 143 11/18/2022 1022   K 3.9 11/01/2023 1800   CL 104 11/01/2023 1800   CO2 26 11/01/2023 1800   GLUCOSE 198 (H) 11/01/2023 1800   BUN 18 11/01/2023 1800   BUN 19 11/18/2022 1022   CREATININE 1.30 (H) 11/01/2023 1800   CREATININE 1.16 02/02/2020 1109   CALCIUM  8.5 (L) 11/01/2023 1800   PROT 6.2 (L) 11/01/2023 1800   PROT 6.8 05/14/2022 0919   ALBUMIN 3.4 (L) 11/01/2023 1800   ALBUMIN 4.4  05/14/2022 0919   AST 24 11/01/2023 1800   ALT 24 11/01/2023 1800   ALKPHOS 61 11/01/2023 1800   BILITOT 0.5 11/01/2023 1800   BILITOT 0.3 05/14/2022 0919   GFRNONAA 57 (L) 11/01/2023 1800   GFRAA >60 08/18/2016 0624   COAGS Lab Results  Component Value Date   INR 1.1 11/03/2023   INR 1.2 11/02/2023   INR 1.4 (H) 11/01/2023   Lipid Panel    Component Value Date/Time   CHOL 150 11/02/2023 0337   CHOL 175 05/14/2022 0919   TRIG 272 (H) 11/02/2023 0337   HDL 44 11/02/2023 0337   HDL 43 05/14/2022 0919   CHOLHDL 3.4 11/02/2023 0337   VLDL 54 (H) 11/02/2023 0337   LDLCALC 52 11/02/2023 0337   LDLCALC 104 (H) 05/14/2022 0919   HgbA1C  Lab Results  Component Value Date   HGBA1C 6.4 (H) 11/02/2023   Alcohol Level No results found for: ETH   SIGNIFICANT DIAGNOSTIC STUDIES CT HEAD WO CONTRAST ( ) Result Date: 11/03/2023 EXAM: CT HEAD WITHOUT CONTRAST 11/03/2023 06:26:37 AM TECHNIQUE: CT of the head was performed without the administration of intravenous contrast. Automated exposure control, iterative reconstruction, and/or weight based adjustment of the mA/kV was utilized to reduce the radiation dose to as low as reasonably achievable. COMPARISON: Head CT and brain MRI dated 11/01/2023. CLINICAL HISTORY: 78 year old male with stroke, follow up. Right thalamic/posterior limb internal capsule hemorrhage 2 days ago. FINDINGS: BRAIN AND VENTRICLES: Oval hyperdense hemorrhage at the anterior and lateral right thalamus, likely involving some of the posterior limb right internal capsule, now measures 11 x 15 x 16 mm. Estimated volume: 1.4 mL. This has not significantly changed since presentation. No intraventricular or extra axial extension of blood. No significant regional mass effect. Elsewhere stable gray white matter differentiation with advanced chronic small vessel disease changes in the deep white matter and deep gray matter nuclei. No evidence of acute infarct. No hydrocephalus. No  midline shift. ORBITS: No acute abnormality. SINUSES: No acute abnormality. SOFT TISSUES AND SKULL: No acute soft tissue abnormality. No skull fracture. IMPRESSION: 1. Stable right thalamic/posterior limb internal capsule hemorrhage (estimated volume 1.4 mL), without significant regional mass effect or intraventricular/extra-axial extension. 2. Underlying Advanced chronic small vessel disease. Electronically signed by: Helayne Hurst MD 11/03/2023 06:44 AM EDT RP Workstation: [id number]   CT ANGIO HEAD NECK W WO CM Result Date: 11/02/2023 EXAM: CTA HEAD AND NECK WITH AND WITHOUT 11/02/2023 04:32:40 AM TECHNIQUE: CTA of the head and neck was performed with and without the administration of 75 mL of iohexol  (OMNIPAQUE ) 350 MG/ML injection. Multiplanar 2D and/or 3D reformatted images are provided for review. Automated exposure control, iterative reconstruction, and/or weight based adjustment of the mA/kV was utilized to reduce the radiation dose to as low as reasonably achievable. Stenosis of  the internal carotid arteries measured using NASCET criteria. COMPARISON: Brain MRI yesterday, head CT yesterday, and earlier. CLINICAL HISTORY: 78 year old male with acute neuro deficit, stroke suspected. FINDINGS: CTA NECK: AORTIC ARCH AND ARCH VESSELS: Partially visible advanced aortic arch atherosclerosis. Arch is not completely visible, probable 3 vessel arch configuration. Proximal subclavian artery atherosclerosis without significant stenosis. No dissection or arterial injury. CERVICAL CAROTID ARTERIES: Mild right CCA atherosclerosis without stenosis. Bulky soft and calcified plaque at the right ICA origin results in narrowing with 80 percent stenosis. Mild right ICA atherosclerosis below the skull base. Patent left CCA with atherosclerosis but no significant stenosis. Bulky soft and calcified plaque at the left ICA origin with 76 percent stenosis. Left ICA remains patent with minor calcified plaque below the skull base. No  dissection or arterial injury. CERVICAL VERTEBRAL ARTERIES: Right vertebral artery is patent, mildly dominant, and mildly tortuous to the skull base with mild plaque and no stenosis. Left vertebral artery origin soft and calcified plaque resulting in mild stenosis. Nondominant left vertebral artery is patent with mild additional plaque to the skull base. No dissection or arterial injury. LUNGS AND MEDIASTINUM: Centrilobular Emphysema and apical lung scarring in the visible upper chest. SOFT TISSUES: Nonvascular neck soft tissue spaces are within normal limits. BONES: Largely absent dentition. Advanced chronic cervical spine degeneration with degenerative appearing interbody ankylosis at C4-C5, bulky disc and endplate degeneration elsewhere including vacuum disc at C5-C6. No acute osseous abnormality. CTA HEAD: ANTERIOR CIRCULATION: Both ICA siphons are patent. Left siphon moderate calcified plaque, right siphon similar moderate calcified plaque. Both ICA siphons also are tortuous; only mild siphon stenosis results. There is a distal left ICA 4 x 2 mm triangular aneurysm vs infundibulum of a diminutive left posterior communicating artery. Patent carotid terminus. Ectatic anterior communicating artery and anteriorly directed 3 mm anterior communicating artery aneurysm on series 5 image 95 and series 9 image 89. Tortuous bilateral ACA branches are otherwise within normal limits. Left MCA M1 segment is patent but with mild to moderate distal M1 irregularity and stenosis on series 11 image 46. Left MCA trifurcation, is ectatic as seen on series 10 image 21. No discrete saccular aneurysm there. Contralateral right MCA M1 segment is patent with mild ectasia. Right MCA bifurcation is patent without stenosis. Bilateral MCA branches are patent without stenosis. POSTERIOR CIRCULATION: Distal vertebral arteries and vertebrobasilar junction are patent without stenosis. Both PICA origins are patent. Right vertebral V4 segment is  dominant with mild to moderate calcified plaque. Patent basilar artery without stenosis. Patent SCA and PCA origins. Diminutive or absent posterior communicating arteries. Left PCA branches are patent with up to moderate distal P2 segment irregularity and stenosis on series 12 image 22. Right PCA branches are patent with mild to moderate distal P2 segment irregularity on series 12 image 16. OTHER: Major dural venous sinuses appear grossly patent with early intracranial venous contrast timing. No CTA spot sign of the right deep gray matter hyperdense hemorrhage; no abnormal vascularity there. IMPRESSION: 1. Positive for advanced atherosclerosis and evidence of small intracranial aneurysms (see #3). Negative for large vessel occlusion or CTA spot sign associated with small acute intra-axial hemorrhage 2. High grade Bilateral ICA origin stenosis: 80 percent stenosis on the right, 76 percent stenosis on the left. 3. 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 Electronically signed by: Helayne Hurst MD 11/02/2023 05:56 AM EDT RP Workstation: [id number]   MR BRAIN WO CONTRAST Result Date:  11/01/2023 EXAM: MRI BRAIN WITHOUT CONTRAST 11/01/2023 10:08:51 PM TECHNIQUE: Multiplanar multisequence MRI of the head/brain was performed without the administration of intravenous contrast. COMPARISON: 12/04/2019 and 11/01/2023. CLINICAL HISTORY: Neuro deficit, acute, stroke suspected. FINDINGS: BRAIN AND VENTRICLES: No acute infarct. Intraparenchymal hemorrhage centered at the right basal ganglia, unchanged in size. Multiple chronic micro hemorrhages in the pons and cerebellum, as well as within the thalami and supratentorial white matter. Early confluent hyperintense T2-weighted signal within the cerebral white matter, most commonly due to chronic small vessel disease. No mass. No midline shift. No hydrocephalus. The sella is unremarkable. Normal flow  voids. ORBITS: No acute abnormality. SINUSES AND MASTOIDS: No acute abnormality. BONES AND SOFT TISSUES: Normal marrow signal. No acute soft tissue abnormality. IMPRESSION: 1. Unchanged right basal ganglia intraparenchymal hemorrhage. 2. Multiple chronic microhemorrhages in the pons, cerebellum, thalami, and supratentorial white matter. 3. Early confluent chronic small vessel disease. Electronically signed by: Franky Stanford MD 11/01/2023 10:18 PM EDT RP Workstation: [id number]   CT Head Wo Contrast Result Date: 11/01/2023 CLINICAL DATA:  Neuro deficit, acute, stroke suspected EXAM: CT HEAD WITHOUT CONTRAST TECHNIQUE: Contiguous axial images were obtained from the base of the skull through the vertex without intravenous contrast. RADIATION DOSE REDUCTION: This exam was performed according to the departmental dose-optimization program which includes automated exposure control, adjustment of the mA and/or kV according to patient size and/or use of iterative reconstruction technique. COMPARISON:  MR head 12/04/2019 FINDINGS: Brain: No evidence of large-territorial acute infarction. Acute, at least 1.8 cm, right intraparenchymal hemorrhage centered in the posterior limb of the internal capsule. No mass lesion. No extra-axial collection. No mass effect or midline shift. No hydrocephalus. Basilar cisterns are patent. Vascular: No hyperdense vessel. Ca Atherosclerotic calcifications are present within the cavernous internal carotid and vertebral arteries. Skull: No acute fracture or focal lesion. Sinuses/Orbits: Paranasal sinuses and mastoid air cells are clear. The orbits are unremarkable. Other: None. IMPRESSION: Acute, at least 1.8 cm, right intraparenchymal hemorrhage centered in the posterior limb of the internal capsule. These results were called by telephone at the time of interpretation on 11/01/2023 at 7:07 pm to provider KEVIN STEINL , who verbally acknowledged these results. Electronically Signed   By: Morgane   Naveau M.D.   On: 11/01/2023 19:12       HISTORY OF PRESENT ILLNESS 78 y.o. patient with history of HTN, HLD, Afib on Coumadin , CAD, PAD, memory loss, and former tobacco use presenting with unstable gait, found to have right basal ganglia intraparenchymal hemorrhage likely related to warfarin anticoagulation for chronic A-fib and possibly an uncontrolled hypertension. Patient does have a history of chronic lacunar infarcts dating back to 2020 as seen on previous MRI. NIH on Admission 4.   HOSPITAL COURSE Patient admitted with c/o fall on 10/31/2023 around 2 PM. At the time of fall, patient denied faintness, dizziness, head injury or LOC, however began to feel dizzy and off balance the following day. He also had some left-sided facial weakness and mildly slurred speech. CT Head showed 1.8 cm, right intraparenchymal hemorrhage centered in the posterior limb of the internal capsule. MRI showed right basal ganglia intraparenchymal hemorrhage, and multiple chronic hemorrhages in the pons, cerebellum, thalami, and supratentorial white matter. On exam, he had mild left-sided upper and lower extremity weakness. He was admitted to the Neuro ICU for observation.  Patient's home warfarin, amlodipine , and losartan  were held during admission. He was continued on home rosuvastatin . His initial INR was 3.2, it was reversed with Kcentra, and last measured  at 1.1. His blood pressure was adequately controlled. Repeat CT on 10/8 showed stable right thalamic/posterior limb internal capsule hemorrhage without significant mass effect. Normal echo. Patient was transferred from the ICU to progressive unit on 10/8. At time of discharge, patient demonstrated improvement in LUE and LLE strength. He was discharged to inpatient rehab at [facility].  Although, patient was on anticoagulation for his A-fib to prevent thromboembolism and strokes, given his intracerebral hemorrhage as well as cerebral microhemorrhages on MRI he is not a good  long-term anticoagulation candidate. Discussed treatment options with the patient and his wife including switching to Eliquis  which she cannot afford, considering Watchman device or participating in ASPIRE stroke prevention study(Eliquis  versus aspirin  in patients with intracerebral hemorrhage and A-fib).   RN Pressure Injury Documentation: None   DISCHARGE EXAM  PHYSICAL EXAM: General:  Alert, well-nourished, well-developed patient in no acute distress Psych:  Mood and affect appropriate for situation CV: Regular rate and rhythm on monitor Respiratory:  Regular, unlabored respirations on room air  Mental Status: AA&Ox3, patient is able to give clear and coherent history Speech/Language: Speech is without dysarthria or aphasia. Naming, repetition, fluency, and comprehension intact.   Cranial Nerves:  II: PERRL. Visual fields full.  III, IV, VI: EOMI. Eyelids elevate symmetrically.  V: Sensation is intact to light touch and symmetrical to face.  VII: Face is symmetrical resting and smiling VIII: hearing intact to voice. IX, X: Palate elevates symmetrically. Phonation is normal.  KP:Dynloizm shrug 5/5. XII: tongue is midline without fasciculations. Motor: Strength - LUE 4/5; LLE 4/5; RUE 5/5; RLE 5/5. Tone: is normal and bulk is normal Sensation- Diminished sensation in L foot, however patient reports this is from a past surgery. Otherwise, intact to light touch bilaterally. Extinction absent to light touch to DSS.   Coordination: FTN intact bilaterally, HKS: no ataxia in BLE. No drift.  Gait- deferred  1a Level of Conscious.: 0 1b LOC Questions: 0 1c LOC Commands: 0 2 Best Gaze: 0 3 Visual: 0 4 Facial Palsy: 1 5a Motor Arm - left: 0 5b Motor Arm - Right: 0 6a Motor Leg - Left: 0 6b Motor Leg - Right: 0 7 Limb Ataxia: 0 8 Sensory: 0 9 Best Language: 0 10 Dysarthria: 0 11 Extinct. and Inatten.: 0 TOTAL: 1   Discharge Diet       Diet   Diet Carb Modified Fluid  consistency: Thin; Room service appropriate? Yes with Assist   liquids  DISCHARGE PLAN Disposition: Rehab Hold home Warfarin given recent ICH Patient interested in participating in ASPIRE stroke prevention study(Eliquis  versus aspirin  following intracerebral hemorrhage in patients with atrial fibrillation) but advised to wait at least 2 weeks from the hemorrhage before Ongoing stroke risk factor control by Primary Care Physician at time of discharge Follow-up PCP Mahlon Comer BRAVO, MD after rehab Follow-up in [facility] in 8 weeks, office to schedule an appointment. Able to see NP in clinic.  60 minutes were spent preparing discharge.  I have personally obtained history,examined this patient, reviewed notes, independently viewed imaging studies, participated in medical decision making and plan of care.ROS completed by me personally and pertinent positives fully documented  I have made any additions or clarifications directly to the above note. Agree with note above.    Eather Popp, MD Medical Director [facility] Pager: [phone] 11/05/2023 2:08 PM

## 2023-11-05 NOTE — H&P (Signed)
 Physical Medicine and Rehabilitation Admission H&P        Chief Complaint  Patient presents with   Functional decline due to ICH      HPI:  John Woodward. John Woodward is a 78 year old male with history of T2DM, MVR, CKD 3b (Dr. Norine), COPD, AAA/B-CAS, PAD w/claudication, PAF- on coumadin , TIA, memory loss who was admitted on 11/01/23 with fall day prior followed by increased weakness, difficulty with balance, facial droop and slurred speech. INR 3.4 at admission and CT head done revealing 1.8 cm right BG hemorrhage. CTA head/neck showed advanced atherosclerosis, no LVO, 80% R-ICA and 76% L-ICA stenosis and small intra-axial hemorrhage with distal L-ICA saccular aneurysm v/s infundibulum and ACA aneurysm 3 mm. Coumadin  reversed and cleveprex used to keep BP 130-150 range. Patient noted to have right sided weakness and Dr. Rosemarie felt that IPH hypertensive and in setting of warfarin.     2 D echo 06/30/23 showed EF 60-65%, moderate calcification aortic valve, mild ascending aortic dilatation 39 mm and no LVH. CT head repeated on 10/08 showing stable right thalamic/posterior limb internal capsule hemorrhage. Participation in Aspire study v/s ASA discussed with patient and family.      PT/OT has been working with patient who is noted to have staggering gait w/leftward stagger,balance deficits,  poor recall, decreased awareness of deficits     Review of Systems  Constitutional:  Negative for chills and fever.  HENT:  Negative for hearing loss.   Eyes:  Negative for blurred vision and photophobia.  Respiratory:  Negative for cough and shortness of breath.   Cardiovascular:  Negative for chest pain and palpitations.  Gastrointestinal:  Negative for constipation, heartburn and nausea.  Genitourinary:  Negative for dysuria.  Musculoskeletal:  Negative for myalgias.           Past Medical History:  Diagnosis Date   Arthritis     Carotid artery disease     Closed fracture dislocation of right  elbow      30 years ago -- fell out of theback of a truck   Diabetes mellitus without complication (HCC)     Diverticulosis     Hemorrhoids     Hx of colonic polyps 02/23/2017   Hyperlipidemia     Hypertension      Diagnosed 10 years ago. No on medication. Denies history of CAD.    Memory loss     PAD (peripheral artery disease)                 Past Surgical History:  Procedure Laterality Date   OLECRANON BURSECTOMY Right 08/18/2016    Procedure: OLECRANON BURSECTOMY;  Surgeon: Addie Glendia Hacker, MD;  Location: [facility] OR;  Service: Orthopedics;  Laterality: Right;               Family History  Problem Relation Age of Onset   Hypertension Mother     Dementia Mother          37s   Transient ischemic attack Mother          70s   Kidney disease Father     COPD Father     Hypertension Brother     Lung cancer Maternal Aunt     Alcohol abuse Maternal Grandfather     Mental illness Paternal Grandmother     Hypertension Daughter     Healthy Son     Learning disabilities Son     Colon cancer Neg Hx  Colon polyps Neg Hx     Esophageal cancer Neg Hx     Rectal cancer Neg Hx     Stomach cancer Neg Hx            Social History:  reports that he quit smoking about 57 years ago. His smoking use included cigarettes. He started smoking about 16 months ago. He has never used smokeless tobacco. He reports that he does not drink alcohol and does not use drugs.     Allergies       Allergies  Allergen Reactions   Antibacterial Hand Soap [Triclosan] Other (See Comments)      Caused water blisters              Medications Prior to Admission  Medication Sig Dispense Refill   amLODipine  (NORVASC ) 10 MG tablet TAKE 1 TABLET BY MOUTH EVERY DAY (Patient taking differently: Take 5 mg by mouth daily. Take one-half tablet (5mg ) by mouth every afternoon.) 90 tablet 1   aspirin  EC 81 MG tablet Take 1 tablet (81 mg total) by mouth daily. Swallow whole.       escitalopram  (LEXAPRO ) 20  MG tablet Take 1 tablet (20 mg total) by mouth daily. 90 tablet 3   losartan  (COZAAR ) 50 MG tablet TAKE 1 TABLET BY MOUTH EVERY DAY 90 tablet 1   nicotine  polacrilex (COMMIT) 2 MG lozenge Take 2 mg by mouth 4 (four) times daily as needed for smoking cessation.       rosuvastatin  (CRESTOR ) 40 MG tablet TAKE 1 TABLET BY MOUTH EVERY DAY 90 tablet 2   warfarin (COUMADIN ) 5 MG tablet Take 1-2 tablets daily or as prescribed by coumadin  clinic (Patient taking differently: Take 2.5-5 mg by mouth daily in the afternoon. Taken one tablet (5mg ) by mouth every day except Wednesday.  ON WEDNESDAY ONLY, take one-half tablet (2.5mg ) as currently prescribed by coumadin  clinic.) 90 tablet 1              Home: Home Living Family/patient expects to be discharged to:: Private residence Living Arrangements: Spouse/significant other, Children Available Help at Discharge: Family, Available 24 hours/day Type of Home: House Home Access: Stairs to enter [employer] of Steps: 5 Entrance Stairs-Rails: Right, Left Home Layout: One level Bathroom Shower/Tub: Psychologist, counselling, Engineer, manufacturing systems: Standard Bathroom Accessibility: Yes Home Equipment: Agricultural consultant (2 wheels), [employer] - single point, Information systems manager  Lives With: Spouse   Functional History: Prior Function Prior Level of Function : Independent/Modified Independent, Driving Mobility Comments: ambulatory without DME, enjoys working on old cars ADLs Comments: independent, wife does most of the cooking   Functional Status:  Mobility: Bed Mobility Overal bed mobility: Needs Assistance Bed Mobility: Supine to Sit Supine to sit: Supervision, HOB elevated Sit to supine: Supervision General bed mobility comments: Supervision for safety exiting L EOB Transfers Overall transfer level: Needs assistance Equipment used: None Transfers: Sit to/from Stand Sit to Stand: Contact guard assist General transfer comment: No LOB, CGA for safety  standing from EOB Ambulation/Gait Ambulation/Gait assistance: Mod assist, Min assist, Contact guard assist Gait Distance (Feet): 260 Feet Assistive device: None Gait Pattern/deviations: Step-through pattern, Staggering left, Decreased stride length, Decreased weight shift to right General Gait Details: Pt ambulates walking on the lateral aspect of his L foot, reportedly due to an old injury. He has a L bias, often stepping over obstacles landing towards the L of the obstacle, even when approached directly straight on. The pt easily staggers to the L when distracted  by cognitive or motor tasks, needing min-modA to regain balance. When focused on his gait balance, he is able to ambulate at a CGA-minA level though. Cued pt to step over blue tiles to focus on midline alignment by stepping directly from center of one edge to the center of the other edge, but pt would often veer or stagger L, despite max cues to weight shift more to the R as pt often has decreased R weight shift. Pt needed cues to avoid stepping on blue tiles with L foot when weaving in and out of them. Gait velocity: reduced Gait velocity interpretation: <1.8 ft/sec, indicate of risk for recurrent falls Stairs: Yes Stairs assistance: Min assist Stair Management: One rail Right, Step to pattern, Forwards Number of Stairs: 5   ADL: ADL Overall ADL's : Needs assistance/impaired Eating/Feeding: Supervision/ safety Grooming: Oral care, Wash/dry face, Contact guard assist, Standing Grooming Details (indicate cue type and reason): LOB x1 to the posterior left without awareness Upper Body Bathing: Supervision/ safety, Sitting Upper Body Bathing Details (indicate cue type and reason): unsafe from standing position Lower Body Bathing: Contact guard assist, Sitting/lateral leans Lower Body Bathing Details (indicate cue type and reason): unsafe from standing position Upper Body Dressing : Sitting, Minimal assistance Upper Body Dressing  Details (indicate cue type and reason): extra time required, decreased propioception of LUE Lower Body Dressing: Minimal assistance, Sit to/from stand Lower Body Dressing Details (indicate cue type and reason): able to perform figure 4 for Bil socks. extra time and effort for LLE. Pt required assist for balance and transfer for sit<>stand dressing Toilet Transfer: Minimal assistance, Ambulation, Rolling walker (2 wheels) Toileting- Clothing Manipulation and Hygiene: Minimal assistance, Sit to/from stand Functional mobility during ADLs: Minimal assistance, Cueing for safety, Rolling walker (2 wheels) General ADL Comments: decreased sensation and proprioception of L side   Cognition: Cognition Overall Cognitive Status: Impaired/Different from baseline Orientation Level: Oriented X4 Attention: Sustained, Selective Sustained Attention: Appears intact Selective Attention: Impaired Selective Attention Impairment: Verbal basic Memory: Impaired Memory Impairment: Storage deficit, Decreased recall of new information Awareness: Impaired Awareness Impairment: Intellectual impairment Safety/Judgment: Impaired Cognition Arousal: Alert Behavior During Therapy: WFL for tasks assessed/performed Overall Cognitive Status: Impaired/Different from baseline   Physical Exam: Blood pressure (!) 153/67, pulse 63, temperature 98 F (36.7 C), temperature source Oral, resp. rate 20, height 5' 11 (1.803 m), weight 71.2 kg, SpO2 100%.  Constitutional: No apparent distress. Appropriate appearance for age.  Sitting up in bedside chair. HENT: No JVD. Neck Supple. Trachea midline. Atraumatic, normocephalic.  Glasses donned Eyes: PERRLA. EOMI. Visual fields grossly intact.  Cardiovascular: RRR, no murmurs/rub/gallops. No Edema. Peripheral pulses 2+  Respiratory: CTAB. No rales, rhonchi, or wheezing. On RA.  Abdomen: + bowel sounds, normoactive. No distention or tenderness.  Skin: C/D/I. No apparent lesions.   Peripheral IV intact MSK:      No apparent deformity.       Neurologic exam:  Cognition: AAO to person, place, time and event.  + Restless, poor awareness in left upper and lower extremity. Language: Fluent, mild dysarthria. Names 3/3 objects correctly.  Memory: Recalls 0/3 objects at 5 minutes.  Insight: Poor insight into current condition.  Mood: Pleasant affect, appropriate mood.  Sensation: To light touch intact in BL UEs and LEs  Reflexes: 3+ reflexes left upper extremity, 1+ reflexes left lower extremity, 2+ reflexes right upper and lower extremity . negative Hoffman's and babinski signs bilaterally.  CN: Mild right facial droop.  Otherwise intact Coordination: Left upper and  lower extremity ataxia. Spasticity: MAS 0 in all extremities.       Strength:                RUE: 5/5 SA, 5/5 EF, 5/5 EE, 5/5 WE, 5/5 FF, 5/5 FA                LUE:  5/5 SA, 5/5 EF, 5/5 EE, 5/5 WE, 5/5 FF, 5/5 FA                RLE: 5/5 HF, 5/5 KE, 5/5  DF, 5/5  EHL, 5/5  PF                 LLE:  4/5 HF, 4/5 KE, 5/5  DF, 5/5  EHL, 5/5  PF          Lab Results Last 48 Hours        Results for orders placed or performed during the hospital encounter of 11/01/23 (from the past 48 hours)  Glucose, capillary     Status: Abnormal    Collection Time: 11/03/23 11:38 AM  Result Value Ref Range    Glucose-Capillary 133 (H) 70 - 99 mg/dL      Comment: Glucose reference range applies only to samples taken after fasting for at least 8 hours.  Glucose, capillary     Status: Abnormal    Collection Time: 11/03/23  4:33 PM  Result Value Ref Range    Glucose-Capillary 112 (H) 70 - 99 mg/dL      Comment: Glucose reference range applies only to samples taken after fasting for at least 8 hours.  Glucose, capillary     Status: Abnormal    Collection Time: 11/03/23  9:20 PM  Result Value Ref Range    Glucose-Capillary 151 (H) 70 - 99 mg/dL      Comment: Glucose reference range applies only to samples taken after  fasting for at least 8 hours.    Comment 1 Notify RN      Comment 2 Document in Chart    Glucose, capillary     Status: Abnormal    Collection Time: 11/04/23  6:14 AM  Result Value Ref Range    Glucose-Capillary 131 (H) 70 - 99 mg/dL      Comment: Glucose reference range applies only to samples taken after fasting for at least 8 hours.    Comment 1 Notify RN      Comment 2 Document in Chart    Glucose, capillary     Status: Abnormal    Collection Time: 11/04/23 11:52 AM  Result Value Ref Range    Glucose-Capillary 138 (H) 70 - 99 mg/dL      Comment: Glucose reference range applies only to samples taken after fasting for at least 8 hours.  Glucose, capillary     Status: None    Collection Time: 11/04/23  4:53 PM  Result Value Ref Range    Glucose-Capillary 94 70 - 99 mg/dL      Comment: Glucose reference range applies only to samples taken after fasting for at least 8 hours.  Glucose, capillary     Status: Abnormal    Collection Time: 11/04/23  9:13 PM  Result Value Ref Range    Glucose-Capillary 260 (H) 70 - 99 mg/dL      Comment: Glucose reference range applies only to samples taken after fasting for at least 8 hours.    Comment 1 Notify RN      Comment 2 Document  in Chart    Glucose, capillary     Status: Abnormal    Collection Time: 11/05/23  6:09 AM  Result Value Ref Range    Glucose-Capillary 157 (H) 70 - 99 mg/dL      Comment: Glucose reference range applies only to samples taken after fasting for at least 8 hours.    Comment 1 Notify RN      Comment 2 Document in Chart    Glucose, capillary     Status: Abnormal    Collection Time: 11/05/23 11:24 AM  Result Value Ref Range    Glucose-Capillary 100 (H) 70 - 99 mg/dL      Comment: Glucose reference range applies only to samples taken after fasting for at least 8 hours.      Imaging Results (Last 48 hours)  No results found.           Blood pressure (!) 153/67, pulse 63, temperature 98 F (36.7 C), temperature  source Oral, resp. rate 20, height 5' 11 (1.803 m), weight 71.2 kg, SpO2 100%.   Medical Problem List and Plan: 1. Functional deficits secondary to right basal ganglia IPH             -patient may  shower             -ELOS/Goals: 10 to 12 days, supervision PT, min assist OT, supervision SLP  - Stable to admit to inpatient rehab  2.  Antithrombotics: -DVT/anticoagulation:  Pharmaceutical: Lovenox              -antiplatelet therapy: N/A at this time.  3. Pain Management: Tylenol  prn.  4. Mood/Behavior/Sleep: LCSW to follow for evaluation and support.              -antipsychotic agents: N/A  5. Neuropsych/cognition: This patient is not capable of making decisions on his own behalf.  - 10-10: Restlessness without agitation.  No unsafe impulsive behaviors seen  6. Skin/Wound Care: Routine pressure relief measures.  7. Fluids/Electrolytes/Nutrition: Monitor I/O. Check CMET in am             --SCr @ 1.31. Encourage fluid intake.  8. PAF: Monitor HR TID--off coumadin  due to ICH             --family to decide on Aspire trial (Eliquis  v/s ASA) 9. T2DM: HGb A1C- 6.4 and well controlled on diet alone.             --CM diet. Will monitor BS ac/hs for  now 10. HTN: Monitor BP TID. SBP goal , 160. 11. Mild cognitive impairment: Last seen by Neuro 11/22 12. Elevated Lipids: Trig-272. VLDL-54. ON              --On Crestor  40 mg    Pamela S Love, PA-C 11/05/2023  I have examined the patient independently and edited the note for HPI, ROS, exam, assessment, and plan as appropriate. I am in agreement with the above recommendations.   Joesph JAYSON Likes, DO 11/05/2023

## 2023-11-05 NOTE — Progress Notes (Signed)
 [name], DO  Physician Physical Medicine and Rehabilitation   PMR Pre-admission    Signed   Date of Service: 11/03/2023  1:00 PM  Related encounter: ED to Hosp-Admission (Discharged) from 11/01/2023 in [facility]   Signed     Expand All Collapse All  Show:Clear all [x] Written[x] Templated[x] Copied  Added by: [x] Alison Heron MATSU, RN[x] [name], DO  [] Hover for details PMR Admission Coordinator Pre-Admission Assessment   Patient: John Woodward. is an 78 y.o., male MRN: [id number] DOB: 12/11/1945 Height: 5' 11 (180.3 cm) Weight: 71.2 kg   Insurance Information HMO: HMO/PPO:      PCP:      IPA:      80/20:      OTHER:  PRIMARY: [employer] Medicare HMO/PPO      Policy#: [id number]      Subscriber: pt CM Name: John Woodward      Phone#: [phone]     Fax#: [phone] Pre-Cert#: [id number]  Auth for CIR from Tiffany with [employer] for admit 11/04/23 with next review date 10/15.  Updates due to Tiffany at fax listed above.        Employer:  Benefits:  Phone #: [phone]     Name: 10/8 Eff. Date: 01/27/23     Deduct: none      Out of Pocket Max: $5900      Life Max: none CIR: $300 co pay per day days 1 until 6      SNF: $10 co pay per day days 1 until 20; $214 co pay per day days 21 until 100 Outpatient: $30 per visit     Co-Pay:  Home Health: 100%      Co-Pay:  DME: 80%     Co-Pay: 20% Providers: in network  SECONDARY: none      Policy#:      Phone#:    Artist:       Phone#:    The Data processing manager" for patients in Inpatient Rehabilitation Facilities with attached "Privacy Act Statement-Health Care Records" was provided and verbally reviewed with: Patient and Family   Emergency Contact Information Contact Information       Name Relation Home Work Mobile    John Woodward  Spouse [phone]         Other Contacts       Name Relation Home Work Mobile     John Woodward Daughter     [phone]         Current Medical History  Patient Admitting Diagnosis: ICH   History of Present Illness: 78 yo male with history of afib on coumadin , PVD, carotid disease,DM type 2,  memory loss, former smoker, HTN, HLD,who presented on 11/01/23 to [facility] with a fall the day before and with unstable gait.  Left facial weakness and mildly slurred speech and dizziness.    CT head showed 1.8 cm right intraparenchymal hemorrhage centered in the posterior limb of the internal capsule. MRI showed right basal ganglia intraparenchymal hemorrhage, and multiple chronic hemorrhages in the pons, cerebellum, thalami and supratentorial white matter. Etiology felt likely due to HTN and [place] related as patient with afib on anticoagulation. CTA head an neck positive for advanced atherosclerosis and evidence of small intracranial aneurysms (see #3). Negative for large vessel occlusion or CTA spot sign associated with small acute intra-axial hemorrhage. High grade Bilateral ICA origin stenosis: 80 percent stenosis on the right, 76 percent stenosis on the left. 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 Now holding all antithrombotics due to hemorrhagic stroked.    Home meds amlodipine . Stable BP> LDL 52 on home med of Rosuvastatin . Hgb A1c 6.4 on no meds at home. CBGS, SSI and recommend close follow up with PCP for better DM control.    Complete NIHSS TOTAL: 1   Patient's medical record from [facility] has been reviewed by the rehabilitation admission coordinator and physician.   Past Medical History      Past Medical History:  Diagnosis Date   Arthritis     Carotid artery disease     Closed fracture dislocation of right elbow      30 years ago -- fell out of theback of a truck   Diabetes mellitus without complication (HCC)     Diverticulosis     Hemorrhoids     Hx of  colonic polyps 02/23/2017   Hyperlipidemia     Hypertension      Diagnosed 10 years ago. No on medication. Denies history of CAD.    Memory loss     PAD (peripheral artery disease)          Has the patient had major surgery during 100 days prior to admission? No   Family History   family history includes Alcohol abuse in his maternal grandfather; COPD in his father; Dementia in his mother; Healthy in his son; Hypertension in his brother, daughter, and mother; Kidney disease in his father; Learning disabilities in his son; Lung cancer in his maternal aunt; Mental illness in his paternal grandmother; Transient ischemic attack in his mother.   Current Medications  Current Medications    Current Facility-Administered Medications:    acetaminophen  (TYLENOL ) tablet 650 mg, 650 mg, Oral, Q4H PRN **OR** acetaminophen  (TYLENOL ) 160 MG/5ML solution 650 mg, 650 mg, Per Tube, Q4H PRN **OR** acetaminophen  (TYLENOL ) suppository 650 mg, 650 mg, Rectal, Q4H PRN, Khaliqdina, Salman, MD   Chlorhexidine Gluconate Cloth 2 % PADS 6 each, 6 each, Topical, Daily, Khaliqdina, Salman, MD, 6 each at 11/03/23 0944   enoxaparin  (LOVENOX ) injection 40 mg, 40 mg, Subcutaneous, Q24H, Rosemarie, Pramod S, MD, 40 mg at 11/05/23 9163   escitalopram  (LEXAPRO ) tablet 20 mg, 20 mg, Oral, Daily, Rosemarie, Pramod S, MD, 20 mg at 11/05/23 9164   insulin  aspart (novoLOG ) injection 0-5 Units, 0-5 Units, Subcutaneous, QHS, Khaliqdina, Salman, MD, 3 Units at 11/04/23 2155   insulin  aspart (novoLOG ) injection 0-9 Units, 0-9 Units, Subcutaneous, TID WC, Khaliqdina, Salman, MD, 2 Units at 11/05/23 0708   nicotine  polacrilex (COMMIT) lozenge 2 mg, 2 mg, Oral, PRN, Khaliqdina, Salman, MD   rosuvastatin  (CRESTOR ) tablet 40 mg, 40 mg, Oral, Daily, Sethi, Pramod S, MD, 40 mg at 11/05/23 9164   senna-docusate (Senokot-S) tablet 1 tablet, 1 tablet, Oral, BID, Khaliqdina, Salman, MD, 1 tablet at 11/04/23 2153     Patients Current Diet:  Diet  Order                  Diet Carb Modified Fluid consistency: Thin; Room service appropriate? Yes with Assist  Diet effective now                       Precautions / Restrictions Precautions Precautions: Fall Restrictions Weight Bearing Restrictions Per Provider Order: No    Has the patient had 2 or more falls or a fall with injury in the past year? No  Prior Activity Level Community (5-7x/wk): independent and driving with no AD   Prior Functional Level Self Care: Did the patient need help bathing, dressing, using the toilet or eating? Independent   Indoor Mobility: Did the patient need assistance with walking from room to room (with or without device)? Independent   Stairs: Did the patient need assistance with internal or external stairs (with or without device)? Independent   Functional Cognition: Did the patient need help planning regular tasks such as shopping or remembering to take medications? Independent   Patient Information Are you of Hispanic, Latino/a,or Spanish origin?: A. No, not of Hispanic, Latino/a, or Spanish origin What is your race?: A. White Do you need or want an interpreter to communicate with a doctor or health care staff?: 0. No   Patient's Response To:  Health Literacy and Transportation Is the patient able to respond to health literacy and transportation needs?: Yes Health Literacy - How often do you need to have someone help you when you read instructions, pamphlets, or other written material from your doctor or pharmacy?: Never In the past 12 months, has lack of transportation kept you from medical appointments or from getting medications?: No In the past 12 months, has lack of transportation kept you from meetings, work, or from getting things needed for daily living?: No   Home Assistive Devices / Equipment Home Equipment: Agricultural consultant (2 wheels), [employer] - single point, Special educational needs teacher Use: Indicate devices/aids used by the  patient prior to current illness, exacerbation or injury? None of the above   Current Functional Level Cognition   Overall Cognitive Status: Impaired/Different from baseline Orientation Level: Oriented X4 Attention: Sustained, Selective Sustained Attention: Appears intact Selective Attention: Impaired Selective Attention Impairment: Verbal basic Memory: Impaired Memory Impairment: Storage deficit, Decreased recall of new information Awareness: Impaired Awareness Impairment: Intellectual impairment Safety/Judgment: Impaired    Extremity Assessment (includes Sensation/Coordination)   Upper Extremity Assessment: Right hand dominant, LUE deficits/detail LUE Deficits / Details: ROM and strength WFL, reports decreased sensation. movements are bigger than they need to be due to decreased sensation and proprioception LUE Sensation: decreased proprioception, decreased light touch LUE Coordination: decreased fine motor  Lower Extremity Assessment: Defer to PT evaluation LLE Deficits / Details: pt with old L ankle fx, reports chronic sensation deficits at foot and shin LLE Sensation: decreased light touch     ADLs   Overall ADL's : Needs assistance/impaired Eating/Feeding: Supervision/ safety Grooming: Oral care, Wash/dry face, Contact guard assist, Standing Grooming Details (indicate cue type and reason): LOB x1 to the posterior left without awareness Upper Body Bathing: Supervision/ safety, Sitting Upper Body Bathing Details (indicate cue type and reason): unsafe from standing position Lower Body Bathing: Contact guard assist, Sitting/lateral leans Lower Body Bathing Details (indicate cue type and reason): unsafe from standing position Upper Body Dressing : Sitting, Minimal assistance Upper Body Dressing Details (indicate cue type and reason): extra time required, decreased propioception of LUE Lower Body Dressing: Minimal assistance, Sit to/from stand Lower Body Dressing Details  (indicate cue type and reason): able to perform figure 4 for Bil socks. extra time and effort for LLE. Pt required assist for balance and transfer for sit<>stand dressing Toilet Transfer: Minimal assistance, Ambulation, Rolling walker (2 wheels) Toileting- Clothing Manipulation and Hygiene: Minimal assistance, Sit to/from stand Functional mobility during ADLs: Minimal assistance, Cueing for safety, Rolling walker (2 wheels) General ADL Comments: decreased sensation and proprioception of L side     Mobility   Overal  bed mobility: Needs Assistance Bed Mobility: Supine to Sit Supine to sit: Supervision, HOB elevated Sit to supine: Supervision General bed mobility comments: Supervision for safety exiting L EOB     Transfers   Overall transfer level: Needs assistance Equipment used: None Transfers: Sit to/from Stand Sit to Stand: Contact guard assist General transfer comment: No LOB, CGA for safety standing from EOB     Ambulation / Gait / Stairs / Wheelchair Mobility   Ambulation/Gait Ambulation/Gait assistance: Mod assist, Min assist, Contact guard assist Gait Distance (Feet): 260 Feet Assistive device: None Gait Pattern/deviations: Step-through pattern, Staggering left, Decreased stride length, Decreased weight shift to right General Gait Details: Pt ambulates walking on the lateral aspect of his L foot, reportedly due to an old injury. He has a L bias, often stepping over obstacles landing towards the L of the obstacle, even when approached directly straight on. The pt easily staggers to the L when distracted by cognitive or motor tasks, needing min-modA to regain balance. When focused on his gait balance, he is able to ambulate at a CGA-minA level though. Cued pt to step over blue tiles to focus on midline alignment by stepping directly from center of one edge to the center of the other edge, but pt would often veer or stagger L, despite max cues to weight shift more to the R as pt often  has decreased R weight shift. Pt needed cues to avoid stepping on blue tiles with L foot when weaving in and out of them. Gait velocity: reduced Gait velocity interpretation: <1.8 ft/sec, indicate of risk for recurrent falls Stairs: Yes Stairs assistance: Min assist Stair Management: One rail Right, Step to pattern, Forwards Number of Stairs: 5     Posture / Balance Dynamic Sitting Balance Sitting balance - Comments: posterior lean with dynamic sitting tasks Balance Overall balance assessment: Needs assistance Sitting-balance support: No upper extremity supported, Feet supported Sitting balance-Leahy Scale: Fair Sitting balance - Comments: posterior lean with dynamic sitting tasks Postural control: Posterior lean Standing balance support: No upper extremity supported, During functional activity Standing balance-Leahy Scale: Poor Standing balance comment: multiple LOB bouts to the L when ambulating, especially when facing motor obstacles or cognitive dual tasking, needing min-modA to recover     Special considerations/life events  Hgb A1c 6.4    Previous Home Environment  Living Arrangements: Spouse/significant other, Children  Lives With: Spouse Available Help at Discharge: Family, Available 24 hours/day Type of Home: House Home Layout: One level Home Access: Stairs to enter Entrance Stairs-Rails: Right, Left Entrance Stairs-Number of Steps: 5 Bathroom Shower/Tub: Psychologist, counselling, Engineer, manufacturing systems: Standard Bathroom Accessibility: Yes How Accessible: Accessible via walker Home Care Services: No   Discharge Living Setting Plans for Discharge Living Setting: Patient's home, House, Lives with (comment) (wife) Type of Home at Discharge: House Discharge Home Layout: One level Discharge Home Access: Stairs to enter Entrance Stairs-Rails: Right, Left, Can reach both Entrance Stairs-Number of Steps: 5 Discharge Bathroom Shower/Tub: Walk-in shower, Tub/shower  unit Discharge Bathroom Toilet: Standard Discharge Bathroom Accessibility: Yes How Accessible: Accessible via walker Does the patient have any problems obtaining your medications?: No   Social/Family/Support Systems Patient Roles: Spouse, Parent Contact Information: wife Anticipated Caregiver: wife Anticipated Caregiver's Contact Information: see contacts Ability/Limitations of Caregiver: no limitations stated Caregiver Availability: 24/7 Discharge Plan Discussed with Primary Caregiver: Yes Is Caregiver In Agreement with Plan?: Yes Does Caregiver/Family have Issues with Lodging/Transportation while Pt is in Rehab?: No   Goals Patient/Family Goal for  Rehab: supervision PT, supervision to min OT, supervision SLP Expected length of stay: ELOS 10 to 12 days Additional Information: Patient has classic racing cars, used to drag race as a hobby Pt/Family Agrees to Admission and willing to participate: Yes Program Orientation Provided & Reviewed with Pt/Caregiver Including Roles  & Responsibilities: Yes   Decrease burden of Care through IP rehab admission: n/a   Possible need for SNF placement upon discharge: not anticipated   Patient Condition: I have reviewed medical records from [facility], spoken with patient and spouse. I met with patient at the bedside for inpatient rehabilitation assessment.  Patient will benefit from ongoing PT, OT, and SLP, can actively participate in 3 hours of therapy a day 5 days of the week, and can make measurable gains during the admission.  Patient will also benefit from the coordinated team approach during an Inpatient Acute Rehabilitation admission.  The patient will receive intensive therapy as well as Rehabilitation physician, nursing, social worker, and care management interventions.  Due to bladder management, bowel management, safety, skin/wound care, disease management, medication administration, pain management, and patient education the patient  requires 24 hour a day rehabilitation nursing.  The patient is currently min assist overall with mobility and basic ADLs.  Discharge setting and therapy post discharge at home with home health is anticipated.  Patient has agreed to participate in the Acute Inpatient Rehabilitation Program and will admit today.   Preadmission Screen Completed By:  Alison Heron Lot RN MSN 11/05/2023 11:27 AM ______________________________________________________________________   Discussed status with Dr. Emeline on 11/05/23 at 1129 and received approval for admission today.   Admission Coordinator:  Alison Heron Lot, RN MSN, time 1129 Date 11/05/23    Assessment/Plan: Diagnosis: R basal ganglia IPH Does the need for close, 24 hr/day Medical supervision in concert with the patient's rehab needs make it unreasonable for this patient to be served in a less intensive setting? Yes Co-Morbidities requiring supervision/potential complications: hypertension, a fib on coumadin , hypertension, diabetes, CAD, AKI, bradycardia, and constipation Due to bladder management, bowel management, safety, skin/wound care, disease management, medication administration, pain management, and patient education, does the patient require 24 hr/day rehab nursing? Yes Does the patient require coordinated care of a physician, rehab nurse, PT, OT, and SLP to address physical and functional deficits in the context of the above medical diagnosis(es)? Yes Addressing deficits in the following areas: balance, endurance, locomotion, strength, transferring, bowel/bladder control, bathing, dressing, feeding, grooming, toileting, cognition, speech, and language Can the patient actively participate in an intensive therapy program of at least 3 hrs of therapy 5 days a week? Yes The potential for patient to make measurable gains while on inpatient rehab is good Anticipated functional outcomes upon discharge from inpatient rehab: supervision PT,  min assist OT, supervision SLP Estimated rehab length of stay to reach the above functional goals is: 10-12 days Anticipated discharge destination: Home 10. Overall Rehab/Functional Prognosis: good     MD Signature:   John JAYSON Emeline, DO 11/05/2023          Revision History

## 2023-11-06 DIAGNOSIS — G8194 Hemiplegia, unspecified affecting left nondominant side: Secondary | ICD-10-CM | POA: Diagnosis not present

## 2023-11-06 DIAGNOSIS — I610 Nontraumatic intracerebral hemorrhage in hemisphere, subcortical: Secondary | ICD-10-CM | POA: Diagnosis not present

## 2023-11-06 DIAGNOSIS — E781 Pure hyperglyceridemia: Secondary | ICD-10-CM | POA: Diagnosis not present

## 2023-11-06 LAB — COMPREHENSIVE METABOLIC PANEL WITH GFR
ALT: 21 U/L (ref 0–44)
AST: 22 U/L (ref 15–41)
Albumin: 3.5 g/dL (ref 3.5–5.0)
Alkaline Phosphatase: 60 U/L (ref 38–126)
Anion gap: 11 (ref 5–15)
BUN: 20 mg/dL (ref 8–23)
CO2: 25 mmol/L (ref 22–32)
Calcium: 8.8 mg/dL — ABNORMAL LOW (ref 8.9–10.3)
Chloride: 104 mmol/L (ref 98–111)
Creatinine, Ser: 1.23 mg/dL (ref 0.61–1.24)
GFR, Estimated: >60 mL/min (ref >60)
Glucose, Bld: 107 mg/dL — ABNORMAL HIGH (ref 70–99)
Potassium: 3.8 mmol/L (ref 3.5–5.1)
Sodium: 140 mmol/L (ref 135–145)
Total Bilirubin: 0.8 mg/dL (ref 0.0–1.2)
Total Protein: 6.3 g/dL — ABNORMAL LOW (ref 6.5–8.1)

## 2023-11-06 LAB — CBC WITH DIFFERENTIAL/PLATELET
Abs Immature Granulocytes: 0.01 K/uL (ref 0.00–0.07)
Basophils Absolute: 0.1 K/uL (ref 0.0–0.1)
Basophils Relative: 1 %
Eosinophils Absolute: 0.5 K/uL (ref 0.0–0.5)
Eosinophils Relative: 8 %
HCT: 42.9 % (ref 39.0–52.0)
Hemoglobin: 14.5 g/dL (ref 13.0–17.0)
Immature Granulocytes: 0 %
Lymphocytes Relative: 28 %
Lymphs Abs: 1.6 K/uL (ref 0.7–4.0)
MCH: 31.9 pg (ref 26.0–34.0)
MCHC: 33.8 g/dL (ref 30.0–36.0)
MCV: 94.3 fL (ref 80.0–100.0)
Monocytes Absolute: 0.6 K/uL (ref 0.1–1.0)
Monocytes Relative: 10 %
Neutro Abs: 3.1 K/uL (ref 1.7–7.7)
Neutrophils Relative %: 53 %
Platelets: 220 K/uL (ref 150–400)
RBC: 4.55 MIL/uL (ref 4.22–5.81)
RDW: 13 % (ref 11.5–15.5)
WBC: 5.8 K/uL (ref 4.0–10.5)
nRBC: 0 % (ref 0.0–0.2)

## 2023-11-06 LAB — GLUCOSE, CAPILLARY
Glucose-Capillary: 110 mg/dL — ABNORMAL HIGH (ref 70–99)
Glucose-Capillary: 97 mg/dL (ref 70–99)
Glucose-Capillary: 83 mg/dL (ref 70–99)
Glucose-Capillary: 93 mg/dL (ref 70–99)

## 2023-11-06 MED ORDER — OMEGA-3-ACID ETHYL ESTERS 1 G PO CAPS
1.0000 g | ORAL_CAPSULE | Freq: Two times a day (BID) | ORAL | Status: DC
  Administered 2023-11-06 – 2023-11-16 (×20): 1 g via ORAL
  Filled 2023-11-06 (×21): qty 1

## 2023-11-06 NOTE — Progress Notes (Signed)
 Physical Therapy Session Note  Patient Details  Name: John Woodward. MRN: [id number] Date of Birth: Jul 02, 1945  Today's Date: 11/06/2023 PT Individual Time: 1350-1445 PT Individual Time Calculation (min): 55 min   Short Term Goals: Week 1:     Skilled Therapeutic Interventions/Progress Updates: Patient supine in bed on entrance to room. Patient alert and agreeable to PT session.   Patient with no complaints of pain, only fatigue and wanting to nap after (PTA communicated to nsg to allow pt to sleep if able to undisturbed until dinner arrives). Discussed with evaluating PT about pt's CLOF and goals prior to PTA arrival.   Therapeutic Activity: Bed Mobility: Pt performed supine<>sit on EOB with supervision.  Transfers: Pt performed sit<>stand transfers throughout session with overall minA and VC to ensure full pivot to safely sit (back of knees touching sitting surface). Pt transported from room<>day room gym in [facility] dependently.   Neuromuscular Re-ed: NMR facilitated during session with focus on proprioceptive feedback, coordination, dynamic standing balance. - sitting EOB with 5lb ankle weight donned on L LE with cues to tap top of orange cone (tap, and flex hips directly above cone, and back down). Pt required increased effort to coordinate tap due to athetoid-like movement. Pt performed mass practice with rest breaks required. PTA used hands as a barrier and target cue for pt to avoid hitting with knee due to uncontrolled external/internal rotation at hip (about a foot in distance). Pt improved with focused practiced and then performed again without ankle weight with smaller target of PTA hand barrier.  - Pt ambulated roughly 110' without HHA and no AD and overall min/heavy minA. Pt L LE athetoid-like movement and required pt to increase focus to safely place step (knee in slight flexion during stance or extension, but not consistent). Pt L UE uncontrolled movement as well that required  HHA or hand behind PTA back for safety.  - Pt sitting edge of mat with PTA using yellow theraband pulling laterally and medially with cues for pt to control movement into hip flexion.   NMR performed for improvements in motor control and coordination, balance, sequencing, judgement, and self confidence/ efficacy in performing all aspects of mobility at highest level of independence.   Patient supine in bed at end of session with brakes locked, bed alarm set, and all needs within reach.   Therapy Documentation Precautions:  Precautions Precautions: Fall Recall of Precautions/Restrictions: Impaired Restrictions Weight Bearing Restrictions Per Provider Order: No   Therapy/Group: Individual Therapy  Josephene Marrone PTA 11/06/2023, 3:28 PM

## 2023-11-06 NOTE — Plan of Care (Signed)
 Problem: RH Balance Goal: LTG: Patient will maintain dynamic sitting balance (OT) Description: LTG:  Patient will maintain dynamic sitting balance with assistance during activities of daily living (OT) Flowsheets (Taken 11/06/2023 1141) LTG: Pt will maintain dynamic sitting balance during ADLs with: Independent Goal: LTG Patient will maintain dynamic standing with ADLs (OT) Description: LTG:  Patient will maintain dynamic standing balance with assist during activities of daily living (OT)  Flowsheets (Taken 11/06/2023 1141) LTG: Pt will maintain dynamic standing balance during ADLs with: Supervision/Verbal cueing   Problem: Sit to Stand Goal: LTG:  Patient will perform sit to stand in prep for activites of daily living with assistance level (OT) Description: LTG:  Patient will perform sit to stand in prep for activites of daily living with assistance level (OT) Flowsheets (Taken 11/06/2023 1141) LTG: PT will perform sit to stand in prep for activites of daily living with assistance level: Supervision/Verbal cueing   Problem: RH Eating Goal: LTG Patient will perform eating w/assist, cues/equip (OT) Description: LTG: Patient will perform eating with assist, with/without cues using equipment (OT) Flowsheets (Taken 11/06/2023 1141) LTG: Pt will perform eating with assistance level of: Independent   Problem: RH Grooming Goal: LTG Patient will perform grooming w/assist,cues/equip (OT) Description: LTG: Patient will perform grooming with assist, with/without cues using equipment (OT) Flowsheets (Taken 11/06/2023 1141) LTG: Pt will perform grooming with assistance level of: Independent with assistive device    Problem: RH Bathing Goal: LTG Patient will bathe all body parts with assist levels (OT) Description: LTG: Patient will bathe all body parts with assist levels (OT) Flowsheets (Taken 11/06/2023 1141) LTG: Pt will perform bathing with assistance level/cueing: Supervision/Verbal cueing    Problem: RH Dressing Goal: LTG Patient will perform upper body dressing (OT) Description: LTG Patient will perform upper body dressing with assist, with/without cues (OT). Flowsheets (Taken 11/06/2023 1141) LTG: Pt will perform upper body dressing with assistance level of: Independent with assistive device Goal: LTG Patient will perform lower body dressing w/assist (OT) Description: LTG: Patient will perform lower body dressing with assist, with/without cues in positioning using equipment (OT) Flowsheets (Taken 11/06/2023 1141) LTG: Pt will perform lower body dressing with assistance level of: Supervision/Verbal cueing   Problem: RH Toileting Goal: LTG Patient will perform toileting task (3/3 steps) with assistance level (OT) Description: LTG: Patient will perform toileting task (3/3 steps) with assistance level (OT)  Flowsheets (Taken 11/06/2023 1141) LTG: Pt will perform toileting task (3/3 steps) with assistance level: Supervision/Verbal cueing   Problem: RH Functional Use of Upper Extremity Goal: LTG Patient will use RT/LT upper extremity as a (OT) Description: LTG: Patient will use right/left upper extremity as a stabilizer/gross assist/diminished/nondominant/dominant level with assist, with/without cues during functional activity (OT) Flowsheets (Taken 11/06/2023 1141) LTG: Use of upper extremity in functional activities: LUE as diminished level   Problem: RH Toilet Transfers Goal: LTG Patient will perform toilet transfers w/assist (OT) Description: LTG: Patient will perform toilet transfers with assist, with/without cues using equipment (OT) Flowsheets (Taken 11/06/2023 1141) LTG: Pt will perform toilet transfers with assistance level of: Supervision/Verbal cueing   Problem: RH Toilet Transfers Goal: LTG Patient will perform toilet transfers w/assist (OT) Description: LTG: Patient will perform toilet transfers with assist, with/without cues using equipment (OT) Flowsheets  (Taken 11/06/2023 1141) LTG: Pt will perform toilet transfers with assistance level of: Supervision/Verbal cueing   Problem: RH Tub/Shower Transfers Goal: LTG Patient will perform tub/shower transfers w/assist (OT) Description: LTG: Patient will perform tub/shower transfers with assist, with/without cues using  equipment (OT) Flowsheets (Taken 11/06/2023 1141) LTG: Pt will perform tub/shower stall transfers with assistance level of: Supervision/Verbal cueing   Problem: RH Tub/Shower Transfers Goal: LTG Patient will perform tub/shower transfers w/assist (OT) Description: LTG: Patient will perform tub/shower transfers with assist, with/without cues using equipment (OT) Flowsheets (Taken 11/06/2023 1141) LTG: Pt will perform tub/shower stall transfers with assistance level of: Supervision/Verbal cueing

## 2023-11-06 NOTE — Progress Notes (Addendum)
 PROGRESS NOTE   Subjective/Complaints:  Pt very active in bed going from supine to sit , setting off alarms, no confusion or agitation noted  Reviewed labs normal ROS- neg CP, SOB, N/V/D  Objective:   No results found. Recent Labs    11/06/23 0637  WBC 5.8  HGB 14.5  HCT 42.9  PLT 220   Recent Labs    11/06/23 0637  NA 140  K 3.8  CL 104  CO2 25  GLUCOSE 107*  BUN 20  CREATININE 1.23  CALCIUM  8.8*    Intake/Output Summary (Last 24 hours) at 11/06/2023 1427 Last data filed at 11/06/2023 1319 Gross per 24 hour  Intake 150 ml  Output 600 ml  Net -450 ml        Physical Exam: Vital Signs Blood pressure (!) 144/86, pulse 70, temperature 97.6 F (36.4 C), temperature source Oral, resp. rate 16, height 5' 11 (1.803 m), weight 70.3 kg, SpO2 94%.   General: No acute distress Mood and affect are appropriate Heart: Regular rate and rhythm no rubs murmurs or extra sounds Lungs: Clear to auscultation, breathing unlabored, no rales or wheezes Abdomen: Positive bowel sounds, soft nontender to palpation, nondistended Extremities: No clubbing, cyanosis, or edema Skin: No evidence of breakdown, no evidence of rash Neurologic: Cranial nerves II through XII intact, motor strength is 5/5 in RIght and 4/5 left  deltoid, bicep, tricep, grip, hip flexor, knee extensors, ankle dorsiflexor and plantar flexor  Cerebellar exam normal finger to nose to finger RUE mild dystmetria LUE  Musculoskeletal: Full range of motion in all 4 extremities. No joint swelling   Assessment/Plan: 1. Functional deficits which require 3+ hours per day of interdisciplinary therapy in a comprehensive inpatient rehab setting. Physiatrist is providing close team supervision and 24 hour management of active medical problems listed below. Physiatrist and rehab team continue to assess barriers to discharge/monitor patient progress toward functional  and medical goals  Care Tool:  Bathing    Body parts bathed by patient: Right arm, Left arm, Abdomen, Chest, Front perineal area, Buttocks, Right upper leg, Left upper leg, Right lower leg, Left lower leg, Face         Bathing assist Assist Level: Minimal Assistance - Patient > 75%     Upper Body Dressing/Undressing Upper body dressing   What is the patient wearing?: Pull over shirt    Upper body assist Assist Level: Contact Guard/Touching assist    Lower Body Dressing/Undressing Lower body dressing      What is the patient wearing?: Pants     Lower body assist Assist for lower body dressing: Minimal Assistance - Patient > 75%     Toileting Toileting    Toileting assist Assist for toileting: Minimal Assistance - Patient > 75%     Transfers Chair/bed transfer  Transfers assist     Chair/bed transfer assist level: Minimal Assistance - Patient > 75%     Locomotion Ambulation   Ambulation assist      Assist level: Minimal Assistance - Patient > 75% Assistive device: No Device Max distance: 150   Walk 10 feet activity   Assist     Assist level: Contact Guard/Touching  assist Assistive device: No Device   Walk 50 feet activity   Assist    Assist level: Contact Guard/Touching assist Assistive device: No Device    Walk 150 feet activity   Assist    Assist level: Minimal Assistance - Patient > 75% Assistive device: No Device    Walk 10 feet on uneven surface  activity   Assist     Assist level: Minimal Assistance - Patient > 75%     Wheelchair     Assist Is the patient using a wheelchair?: No Type of Wheelchair: Manual    Wheelchair assist level: Supervision/Verbal cueing Max wheelchair distance: 200    Wheelchair 50 feet with 2 turns activity    Assist        Assist Level: Supervision/Verbal cueing   Wheelchair 150 feet activity     Assist      Assist Level: Supervision/Verbal cueing   Blood  pressure (!) 144/86, pulse 70, temperature 97.6 F (36.4 C), temperature source Oral, resp. rate 16, height 5' 11 (1.803 m), weight 70.3 kg, SpO2 94%.  Medical Problem List and Plan: 1. Functional deficits secondary to right basal ganglia IPH             -patient may  shower             -ELOS/Goals: 10 to 12 days, supervision PT, min assist OT, supervision SLP             - Stable to admit to inpatient rehab   2.  Antithrombotics: -DVT/anticoagulation:  Pharmaceutical: Lovenox              -antiplatelet therapy: N/A at this time.  3. Pain Management: Tylenol  prn.  4. Mood/Behavior/Sleep: LCSW to follow for evaluation and support.              -antipsychotic agents: N/A   5. Neuropsych/cognition: This patient is not capable of making decisions on his own behalf.             - 10-10: Restlessness without agitation.  No unsafe impulsive behaviors seen   6. Skin/Wound Care: Routine pressure relief measures.  7. Fluids/Electrolytes/Nutrition: Monitor I/O. Check CMET in am             --SCr @ 1.31. Encourage fluid intake.  8. PAF: Monitor HR TID--off coumadin  due to ICH             --family to decide on Aspire trial (Eliquis  v/s ASA) 9. T2DM: HGb A1C- 6.4 and well controlled on diet alone.          CBG (last 3)  Recent Labs    11/05/23 2031 11/06/23 0524 11/06/23 1206  GLUCAP 178* 110* 97   At goal 10/11 10. HTN: Monitor BP TID. SBP goal , 160. Vitals:   11/06/23 0419 11/06/23 1318  BP: (!) 143/76 (!) 144/86  Pulse: 62 70  Resp: 19 16  Temp: (!) 97.4 F (36.3 C) 97.6 F (36.4 C)  SpO2: 94% 94%    11. Mild cognitive impairment: Last seen by Neuro 11/22 12. Elevated Lipids: Trig-272. VLDL-54. ON              --On Crestor  40 mg, add lovaza        LOS: 1 days A FACE TO FACE EVALUATION WAS PERFORMED  Prentice FORBES Compton 11/06/2023, 2:27 PM

## 2023-11-06 NOTE — Evaluation (Signed)
 Physical Therapy Assessment and Plan  Patient Details  Name: Jie Stickels. MRN: [id number] Date of Birth: Feb 19, 1945  PT Diagnosis: Ataxia, Ataxic gait, Cognitive deficits, Coordination disorder, Hemiparesis dominant, Impaired cognition, Impaired sensation, and Muscle weakness Rehab Potential: Good ELOS: 7 to 10 days   Today's Date: 11/06/2023 PT Individual Time: 1350-1445 PT Individual Time Calculation (min): 55 min    Hospital Problem: Principal Problem:   ICH (intracerebral hemorrhage) (HCC)   Past Medical History:  Past Medical History:  Diagnosis Date   Arthritis    Carotid artery disease    Closed fracture dislocation of right elbow    30 years ago -- fell out of theback of a truck   Diabetes mellitus without complication (HCC)    Diverticulosis    Hemorrhoids    Hx of colonic polyps 02/23/2017   Hyperlipidemia    Hypertension    Diagnosed 10 years ago. No on medication. Denies history of CAD.    Memory loss    PAD (peripheral artery disease)    Past Surgical History:  Past Surgical History:  Procedure Laterality Date   OLECRANON BURSECTOMY Right 08/18/2016   Procedure: OLECRANON BURSECTOMY;  Surgeon: Addie Glendia Hacker, MD;  Location: [facility] OR;  Service: Orthopedics;  Laterality: Right;    Assessment & Plan Clinical Impression: Patient is a 78 year old male with history of T2DM, MVR, CKD 3b (Dr. Norine), COPD, AAA/B-CAS, PAD w/claudication, PAF- on coumadin , TIA, memory loss who was admitted on 11/01/23 with fall day prior followed by increased weakness, difficulty with balance, facial droop and slurred speech. INR 3.4 at admission and CT head done revealing 1.8 cm right BG hemorrhage. CTA head/neck showed advanced atherosclerosis, no LVO, 80% R-ICA and 76% L-ICA stenosis and small intra-axial hemorrhage with distal L-ICA saccular aneurysm v/s infundibulum and ACA aneurysm 3 mm. Coumadin  reversed and cleveprex used to keep BP 130-150 range. Patient noted to have  right sided weakness and Dr. Rosemarie felt that IPH hypertensive and in setting of warfarin.     2 D echo 06/30/23 showed EF 60-65%, moderate calcification aortic valve, mild ascending aortic dilatation 39 mm and no LVH. CT head repeated on 10/08 showing stable right thalamic/posterior limb internal capsule hemorrhage. Participation in Aspire study v/s ASA discussed with patient and family.     PT/OT has been working with patient who is noted to have staggering gait w/leftward stagger,balance deficits,  poor recall, decreased awareness of deficits  Patient transferred to CIR on 11/05/2023 .   Patient currently requires min with mobility secondary to muscle weakness, decreased cardiorespiratoy endurance, ataxia and decreased coordination, decreased attention to left, and decreased awareness, decreased problem solving, decreased safety awareness, and decreased memory.  Prior to hospitalization, patient was independent  with mobility and lived with Spouse, Son in a House home.  Home access is 5Stairs to enter.  Patient will benefit from skilled PT intervention to maximize safe functional mobility, minimize fall risk, and decrease caregiver burden for planned discharge home with 24 hour supervision.  Anticipate patient will benefit from follow up HH at discharge.  PT - End of Session Activity Tolerance: Tolerates 30+ min activity with multiple rests Endurance Deficit: Yes Endurance Deficit Description: rest breaks within BADL tasks PT Assessment Rehab Potential (ACUTE/IP ONLY): Good PT Barriers to Discharge: Inaccessible home environment PT Patient demonstrates impairments in the following area(s): Balance;Endurance;Safety;Perception PT Transfers Functional Problem(s): Bed Mobility;Bed to Chair;Car PT Locomotion Functional Problem(s): Ambulation;Stairs PT Plan PT Intensity: Minimum of 1-2 x/day ,45 to  90 minutes PT Frequency: 5 out of 7 days PT Duration Estimated Length of Stay: 7 to 9 days PT  Treatment/Interventions: Ambulation/gait training;Community reintegration;DME/adaptive equipment instruction;Neuromuscular re-education;Stair training;UE/LE Strength taining/ROM;Balance/vestibular training;Disease management/prevention;Functional mobility training;Patient/family education;Therapeutic Exercise;UE/LE Coordination activities;Therapeutic Activities PT Transfers Anticipated Outcome(s): S transfers PT Locomotion Anticipated Outcome(s): S ambulation, S stairs PT Recommendation Recommendations for Other Services: None Follow Up Recommendations: Home health PT Patient destination: Home Equipment Recommended: To be determined   PT Evaluation Precautions/Restrictions Precautions Precautions: Fall Recall of Precautions/Restrictions: Impaired Restrictions Weight Bearing Restrictions Per Provider Order: No General Chart Reviewed: Yes Family/Caregiver Present: No Vital Signs  Pain Pain Assessment Pain Scale: 0-10 Pain Score: 2 Pain Type: Chronic pain Pain Location: Ankle Pain Orientation: Left Pain Interference Pain Interference Pain Effect on Sleep: 1. Rarely or not at all Pain Interference with Therapy Activities: 1. Rarely or not at all Pain Interference with Day-to-Day Activities: 1. 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 Vision/Perception  Vision - History Ability to See in Adequate Light: 0 Adequate Vision -  Assessment Additional Comments: no changes from baseline Perception Perception: Impaired Preception Impairment Details: Spatial orientation Perception-Other Comments: mild inattention L visual field and L hemibody Praxis Praxis: Impaired Praxis Impairment Details: Motor planning  Cognition Overall Cognitive Status: Impaired/Different from baseline Arousal/Alertness: Awake/alert Orientation Level: Oriented X4 Year: 2025 Month: October Day of Week: Incorrect (Sunday) Attention: Sustained;Selective Sustained Attention: Appears intact Selective Attention: Impaired Selective Attention Impairment: Verbal basic Memory: Impaired Memory Impairment: Storage deficit;Decreased recall of new information Awareness: Impaired Awareness Impairment: Intellectual impairment Problem Solving: Impaired Safety/Judgment: Impaired Comments: poor awareness of deficits Impulsive Sensation Sensation Light Touch: Impaired Detail Light Touch Impaired Details: Impaired LLE;Impaired LUE;Impaired RLE Proprioception: Impaired Detail Proprioception Impaired Details: Impaired LLE Additional Comments: L LE tingling has been present since back injury 40 years ago Coordination Gross Motor Movements are Fluid and Coordinated: No Fine Motor Movements are Fluid and Coordinated: No Coordination and Movement Description: decreased smoothness and accuracy with L UE Finger Nose Finger Test: dysmetria Heel Shin Test: impaired L worse than R Motor  Motor Motor: Ataxia (hemiparesis) Motor - Skilled Clinical Observations: L hemiparesis, Athetoid movement L LE Mobility Bed Mobility Bed Mobility: Rolling Left;Rolling Right;Supine to Sit;Sit to Supine Rolling Right: Supervision/verbal cueing Rolling Left: Supervision/Verbal cueing Supine to Sit: Supervision/Verbal cueing Sit to Supine: Supervision/Verbal cueing Transfers Transfers: Sit to Stand;Stand to Sit;Stand Pivot Transfers Sit to Stand: Contact Guard/Touching  assist Stand to Sit: Contact Guard/Touching assist Stand Pivot Transfers: Contact Guard/Touching assist;Minimal Assistance - Patient > 75% Transfer (Assistive device): None Locomotion Gait Ambulation: Yes Gait Assistance: Minimal Assistance - Patient > 75% Gait Distance (Feet): 150 Feet Assistive device: None Gait Gait velocity: reduced Stairs / Additional Locomotion Stairs: Yes Stairs Assistance: Minimal Assistance - Patient > 75%;Contact Guard/Touching assist Stair Management Technique: Two rails;One rail Left Number of Stairs: 12 Height of Stairs: 6 Ramp: Minimal Assistance - Patient >75% Wheelchair Mobility Wheelchair Mobility: Yes Wheelchair Assistance: Doctor, general practice: Both upper extremities Distance: 200 Trunk/Postural Assessment  Cervical Assessment Cervical Assessment: Exceptions to [facility] (forward head posture)  Thoracic Assessment Thoracic Assessment: Exceptions to [facility] (B shoulders) Lumbar Assessment Lumbar Assessment: Exceptions to [facility] Postural Control Postural Control: Deficits on evaluation  Balance Balance Balance Assessed: Yes Static Sitting Balance Static Sitting - Balance Support: Feet supported Static Sitting - Level of Assistance: 5: Stand by assistance Dynamic Sitting Balance Dynamic Sitting - Balance Support: Feet supported Dynamic Sitting - Level of Assistance: 5: Stand by assistance;4: Min assist (CGA) Static Standing Balance Static Standing - Balance Support: During functional activity Static Standing - Level of Assistance: 4: Min assist;5: Stand by assistance Dynamic Standing Balance Dynamic Standing - Balance Support: During functional activity Dynamic Standing - Level of Assistance: 4: Min assist Extremity Assessment  RUE Assessment RUE Assessment: Within Functional Limits LUE Assessment LUE Assessment: Exceptions to [facility] General Strength Comments: L UE 4-/5, defcitis in coordination RLE Assessment RLE  Assessment: Within Functional Limits LLE Assessment LLE Assessment: Exceptions to [facility] Passive Range of Motion (PROM) Comments: WFLs except DF to neutral General Strength Comments: 4-/5 to 4/5  Care Tool Care Tool Bed Mobility Roll left and right activity   Roll left and right assist level: Supervision/Verbal cueing    Sit to lying activity   Sit to lying assist level: Supervision/Verbal cueing    Lying to sitting on side of bed activity   Lying to sitting on side of bed assist level: the ability to move from lying on the back to sitting on the side of the bed with no back support.: Supervision/Verbal cueing     Care Tool Transfers Sit to stand transfer   Sit to stand assist level: Minimal Assistance - Patient > 75%    Chair/bed transfer   Chair/bed transfer assist level: Minimal Assistance - Patient > 75%    Car transfer   Car transfer assist level: Contact Guard/Touching assist      Care Tool Locomotion Ambulation   Assist level: Minimal Assistance - Patient > 75% Assistive device: No Device Max distance: 150  Walk 10 feet activity   Assist level: Contact Guard/Touching assist Assistive device: No Device   Walk 50 feet with 2 turns activity   Assist level: Contact Guard/Touching assist Assistive device: No Device  Walk 150 feet activity   Assist level: Minimal Assistance - Patient > 75% Assistive device: No Device  Walk 10 feet on uneven surfaces activity   Assist level: Minimal Assistance - Patient > 75%    Stairs   Assist level: Minimal Assistance - Patient > 75% Stairs assistive device: 1 hand rail;2 hand rails Max number of stairs: 12  Walk up/down 1 step activity   Walk up/down 1 step (curb) assist level: Contact Guard/Touching assist Walk up/down 1 step or curb assistive device: 2 hand rails  Walk up/down 4 steps activity   Walk up/down 4 steps assist level: Contact Guard/Touching assist Walk up/down 4 steps assistive device: 2 hand rails  Walk up/down  12 steps activity   Walk up/down 12 steps assist level: Minimal Assistance - Patient > 75% Walk up/down 12 steps assistive device: 1 hand rail;2 hand rails  Pick up small objects from floor   Pick up small object from the floor assist level: Minimal Assistance - Patient > 75%    Wheelchair Is the patient using a wheelchair?: No Type of Wheelchair: Manual   Wheelchair assist level: Supervision/Verbal cueing Max wheelchair distance: 200  Wheel 50 feet with 2 turns activity   Assist Level: Supervision/Verbal cueing  Wheel 150 feet activity   Assist Level: Supervision/Verbal cueing  Refer to Care Plan for Long Term Goals  SHORT TERM GOAL WEEK 1    Recommendations for other services: None   Skilled Therapeutic Intervention PT evaluation completed and treatment plan initiated. Pt completed multiple transfers with contact guard to min , increased assistance required as pt fatigued. Pt ambulated 140 feet with rolling walker and contact guard to min A with verbal and tactile cues for safety awareness. Pt ambulates with ataxic gait. Pt propelled w/c throughout hospital with S and multiple verbal cues for safety and technique with increased time required. Pt's demonstrates poor safety awareness and insight into his limitations. Pt returned to room following treatment. Pt transferred w/c to edge of bed with contact guard and verbal cues. Pt transferred edge of bed to supine with S. Pt left sitting up in bed with bed alarm and all needs within reach.    Discharge Criteria: Patient will be discharged from PT if patient refuses treatment 3 consecutive times without medical reason, if treatment goals not met, if there is a change in medical status, if patient makes no progress towards goals or if patient is discharged from hospital.  The above assessment, treatment plan, treatment alternatives and goals were discussed and mutually agreed upon: by patient  Merilee Lynwood MATSU 11/06/2023, 12:39 PM

## 2023-11-07 DIAGNOSIS — E781 Pure hyperglyceridemia: Secondary | ICD-10-CM | POA: Diagnosis not present

## 2023-11-07 DIAGNOSIS — I610 Nontraumatic intracerebral hemorrhage in hemisphere, subcortical: Secondary | ICD-10-CM | POA: Diagnosis not present

## 2023-11-07 DIAGNOSIS — G8194 Hemiplegia, unspecified affecting left nondominant side: Secondary | ICD-10-CM | POA: Diagnosis not present

## 2023-11-07 LAB — GLUCOSE, CAPILLARY
Glucose-Capillary: 112 mg/dL — ABNORMAL HIGH (ref 70–99)
Glucose-Capillary: 126 mg/dL — ABNORMAL HIGH (ref 70–99)
Glucose-Capillary: 118 mg/dL — ABNORMAL HIGH (ref 70–99)
Glucose-Capillary: 137 mg/dL — ABNORMAL HIGH (ref 70–99)

## 2023-11-07 NOTE — Progress Notes (Addendum)
 PROGRESS NOTE   Subjective/Complaints:  Spoke with son and wife at bedside, no change in cognition since CVA, MCI diagnosed 2022, + insomnia , per MAR no trazodone given  Reviewed labs normal ROS- neg CP, SOB, N/V/D  Objective:   No results found. Recent Labs    11/06/23 0637  WBC 5.8  HGB 14.5  HCT 42.9  PLT 220   Recent Labs    11/06/23 0637  NA 140  K 3.8  CL 104  CO2 25  GLUCOSE 107*  BUN 20  CREATININE 1.23  CALCIUM  8.8*    Intake/Output Summary (Last 24 hours) at 11/07/2023 1247 Last data filed at 11/07/2023 0851 Gross per 24 hour  Intake 340 ml  Output 200 ml  Net 140 ml        Physical Exam: Vital Signs Blood pressure (!) 143/77, pulse (!) 57, temperature 98.3 F (36.8 C), temperature source Oral, resp. rate 18, height 5' 11 (1.803 m), weight 70.3 kg, SpO2 95%.   General: No acute distress Mood and affect are appropriate Heart: Regular rate and rhythm no rubs murmurs or extra sounds Lungs: Clear to auscultation, breathing unlabored, no rales or wheezes Abdomen: Positive bowel sounds, soft nontender to palpation, nondistended Extremities: No clubbing, cyanosis, or edema Skin: No evidence of breakdown, no evidence of rash Neurologic: Cranial nerves II through XII intact, motor strength is 5/5 in RIght and 4/5 left  deltoid, bicep, tricep, grip, hip flexor, knee extensors, ankle dorsiflexor and plantar flexor  Cerebellar exam normal finger to nose to finger RUE mild dystmetria LUE  Musculoskeletal: Full range of motion in all 4 extremities. No joint swelling   Assessment/Plan: 1. Functional deficits which require 3+ hours per day of interdisciplinary therapy in a comprehensive inpatient rehab setting. Physiatrist is providing close team supervision and 24 hour management of active medical problems listed below. Physiatrist and rehab team continue to assess barriers to discharge/monitor  patient progress toward functional and medical goals  Care Tool:  Bathing    Body parts bathed by patient: Right arm, Left arm, Abdomen, Chest, Front perineal area, Buttocks, Right upper leg, Left upper leg, Right lower leg, Left lower leg, Face         Bathing assist Assist Level: Minimal Assistance - Patient > 75%     Upper Body Dressing/Undressing Upper body dressing   What is the patient wearing?: Pull over shirt    Upper body assist Assist Level: Contact Guard/Touching assist    Lower Body Dressing/Undressing Lower body dressing      What is the patient wearing?: Pants     Lower body assist Assist for lower body dressing: Minimal Assistance - Patient > 75%     Toileting Toileting    Toileting assist Assist for toileting: Minimal Assistance - Patient > 75%     Transfers Chair/bed transfer  Transfers assist     Chair/bed transfer assist level: Minimal Assistance - Patient > 75%     Locomotion Ambulation   Ambulation assist      Assist level: Minimal Assistance - Patient > 75% Assistive device: No Device Max distance: 150   Walk 10 feet activity   Assist  Assist level: Contact Guard/Touching assist Assistive device: No Device   Walk 50 feet activity   Assist    Assist level: Contact Guard/Touching assist Assistive device: No Device    Walk 150 feet activity   Assist    Assist level: Minimal Assistance - Patient > 75% Assistive device: No Device    Walk 10 feet on uneven surface  activity   Assist     Assist level: Minimal Assistance - Patient > 75%     Wheelchair     Assist Is the patient using a wheelchair?: No Type of Wheelchair: Manual    Wheelchair assist level: Supervision/Verbal cueing Max wheelchair distance: 200    Wheelchair 50 feet with 2 turns activity    Assist        Assist Level: Supervision/Verbal cueing   Wheelchair 150 feet activity     Assist      Assist Level:  Supervision/Verbal cueing   Blood pressure (!) 143/77, pulse (!) 57, temperature 98.3 F (36.8 C), temperature source Oral, resp. rate 18, height 5' 11 (1.803 m), weight 70.3 kg, SpO2 95%.  Medical Problem List and Plan: 1. Functional deficits secondary to right basal ganglia IPH Poor motor control left side alien arm and leg , no sensory loss             -patient may  shower             -ELOS/Goals: 10 to 12 days, supervision PT, min assist OT, supervision SLP             - Stable to admit to inpatient rehab   2.  Antithrombotics: -DVT/anticoagulation:  Pharmaceutical: Lovenox              -antiplatelet therapy: N/A at this time.  3. Pain Management: Tylenol  prn.  4. Mood/Behavior/Sleep: LCSW to follow for evaluation and support.              -antipsychotic agents: N/A   5. Neuropsych/cognition: This patient is not capable of making decisions on his own behalf.             - 10-10: Restlessness without agitation.  No unsafe impulsive behaviors seen   6. Skin/Wound Care: Routine pressure relief measures.  7. Fluids/Electrolytes/Nutrition: Monitor I/O. Check CMET in am             --SCr @ 1.31. Encourage fluid intake.  8. PAF: Monitor HR TID--off coumadin  due to ICH             --family to decide on Aspire trial (Eliquis  v/s ASA) 9. T2DM: HGb A1C- 6.4 and well controlled on diet alone.          CBG (last 3)  Recent Labs    11/06/23 2040 11/07/23 0556 11/07/23 1223  GLUCAP 93 112* 126*   At goal 10/12 10. HTN: Monitor BP TID. SBP goal , 160. Vitals:   11/06/23 1933 11/07/23 0500  BP: (!) 162/71 (!) 143/77  Pulse: (!) 58 (!) 57  Resp: 18 18  Temp: 97.8 F (36.6 C) 98.3 F (36.8 C)  SpO2: 97% 95%    11. Mild cognitive impairment: Last seen by Neuro 11/22- per family does not have appeared to worsen with ICH  12. Elevated Lipids: Trig-272. VLDL-54. ON              --On Crestor  40 mg, add lovaza- will need repeat trig level in 1 mo         LOS: 2 days  A FACE TO FACE  EVALUATION WAS PERFORMED  John Woodward 11/07/2023, 12:47 PM

## 2023-11-08 ENCOUNTER — Ambulatory Visit

## 2023-11-08 DIAGNOSIS — I613 Nontraumatic intracerebral hemorrhage in brain stem: Secondary | ICD-10-CM | POA: Diagnosis not present

## 2023-11-08 LAB — CBC
HCT: 41.5 % (ref 39.0–52.0)
Hemoglobin: 13.8 g/dL (ref 13.0–17.0)
MCH: 31.9 pg (ref 26.0–34.0)
MCHC: 33.3 g/dL (ref 30.0–36.0)
MCV: 95.8 fL (ref 80.0–100.0)
Platelets: 205 K/uL (ref 150–400)
RBC: 4.33 MIL/uL (ref 4.22–5.81)
RDW: 13.3 % (ref 11.5–15.5)
WBC: 5.2 K/uL (ref 4.0–10.5)
nRBC: 0 % (ref 0.0–0.2)

## 2023-11-08 LAB — GLUCOSE, CAPILLARY
Glucose-Capillary: 109 mg/dL — ABNORMAL HIGH (ref 70–99)
Glucose-Capillary: 95 mg/dL (ref 70–99)
Glucose-Capillary: 97 mg/dL (ref 70–99)
Glucose-Capillary: 106 mg/dL — ABNORMAL HIGH (ref 70–99)

## 2023-11-08 LAB — BASIC METABOLIC PANEL WITH GFR
Anion gap: 9 (ref 5–15)
BUN: 23 mg/dL (ref 8–23)
CO2: 26 mmol/L (ref 22–32)
Calcium: 8.8 mg/dL — ABNORMAL LOW (ref 8.9–10.3)
Chloride: 107 mmol/L (ref 98–111)
Creatinine, Ser: 1.37 mg/dL — ABNORMAL HIGH (ref 0.61–1.24)
GFR, Estimated: 53 mL/min — ABNORMAL LOW (ref >60)
Glucose, Bld: 104 mg/dL — ABNORMAL HIGH (ref 70–99)
Potassium: 4.1 mmol/L (ref 3.5–5.1)
Sodium: 142 mmol/L (ref 135–145)

## 2023-11-08 MED ORDER — DICLOFENAC SODIUM 1 % EX GEL
2.0000 g | Freq: Four times a day (QID) | CUTANEOUS | Status: DC
  Administered 2023-11-08 – 2023-11-16 (×27): 2 g via TOPICAL
  Filled 2023-11-08: qty 100

## 2023-11-08 NOTE — Progress Notes (Signed)
 Physical Therapy Session Note  Patient Details  Name: John Woodward. MRN: [id number] Date of Birth: 10/01/1945  Today's Date: 11/08/2023 PT Individual Time: 1334-1430 PT Individual Time Calculation (min): 56 min   Short Term Goals: Week 1:  PT Short Term Goal 1 (Week 1): STGs = LTGs  Skilled Therapeutic Interventions/Progress Updates:     Pt received supine in bed and agrees to therapy. Reports some pain in Lt foot. Says it is chronic in nature but worse since stroke. PT provides rest breaks as needed to manage pain. Pt performs supine to sit with cues for positioning at EOB for safety. Stand step transfer to [facility] with minA and cues for sequencing and positioning. WC transport to gym for time management. Pt performs multiple reps of sit to stand during session, with PT providing CGA and cues for hand placement and body mechanics. Pt ambulates x160' without AD, with CGA/minA at trunk for increased postural sway and several slight LOBs. Pt generally able to complete straight line ambulation with CGA but turns and more high level gait requires minA. Seated rest break. Following rest break, pt stands and ambulates x175' with similar assistance and cues. Following rest break, pt performs standing balance activity, tasked with tapping Rt and Lt foot on colored cones to challenge coordination, weight shifting, and provide NMR to Lt hemibody. Pt completes without AD, and requires minA at Lt side of trunk for tactile feedback and maintaining balance. Pt able to accurately tap Rt foot on cones without knocking them over >90% of time. Pt completes successfully with LLE ~50% of time. Following rest break, pt completes step ups onto 4 step with alternating lower extremities. PT provides minA overall, with cues for lateral weight shifting and upright posture. Pt has occasional LOB Lt and posteriorly. WC transport back to room. Left seated with alarm intact and all needs within reach.   Therapy  Documentation Precautions:  Precautions Precautions: Fall Recall of Precautions/Restrictions: Impaired Restrictions Weight Bearing Restrictions Per Provider Order: No   Therapy/Group: Individual Therapy  Elsie JAYSON Dawn, PT, DPT 11/08/2023, 4:03 PM

## 2023-11-08 NOTE — Progress Notes (Signed)
 Occupational Therapy Session Note  Patient Details  Name: John Woodward. MRN: [id number] Date of Birth: [birth year]  Today's Date: 11/08/2023 OT Individual Time: 1050-1200 OT Individual Time Calculation (min): 70 min   Short Term Goals: Week 1:  OT Short Term Goal 1 (Week 1): Patient will complete 3/3 toileting tasks with CGA OT Short Term Goal 2 (Week 1): Patient will demonstrate improved safety awareness by locking wc breaks prior to stand with min cues OT Short Term Goal 3 (Week 1): Patient will complete LB dressing with CGA.  Skilled Therapeutic Interventions/Progress Updates:   Pt greeted resting in bed for session with focus on BADL retraining at shower level. Bed mobility with supervision. Ambulating bathroom transfers with Min A (HHA), patient reporting increased pain in L-ankle (previously injured). Rest provided as needed. Toileting completes at [facility] level, similarly to standing periarea bathing with use of grab bar. Pt bathes remainder of body at supervision level, using figure-4 technique to reach distal LE(s). LB dressing with CGA + education on hemi-dressing techniques, as well as, cuing for safe pacing and pursued-lipped breathing as patient unaware of overexertion. Setup A provided for UB dressing and socks. Pt ends session with more than household level ambulation from room<>day room and x1 around nurses station for general conditioning. CGA-close supervision provided with use of RW, increased difficulty noted with distractions, x1 lateral LOB requiring Min A to recover. Pt remained resting in bed with spouse at bedside, immediate needs met.   Therapy Documentation Precautions:  Precautions Precautions: Fall Recall of Precautions/Restrictions: Impaired Restrictions Weight Bearing Restrictions Per Provider Order: No   Therapy/Group: Individual Therapy  Nereida Habermann, OTR/L, MSOT  11/08/2023, 8:01 AM

## 2023-11-08 NOTE — Plan of Care (Signed)
  Problem: Consults Goal: RH STROKE PATIENT EDUCATION Description: See Patient Education module for education specifics  Outcome: Progressing   Problem: RH BOWEL ELIMINATION Goal: RH STG MANAGE BOWEL WITH ASSISTANCE Description: STG Manage Bowel with supervision-min Assistance. Outcome: Progressing   Problem: RH BLADDER ELIMINATION Goal: RH STG MANAGE BLADDER WITH ASSISTANCE Description: STG Manage Bladder With supervision-min Assistance Outcome: Progressing   Problem: RH SKIN INTEGRITY Goal: RH STG SKIN FREE OF INFECTION/BREAKDOWN Description: Manage skin free of infection/breakdown with supervision to min assistance Outcome: Progressing   Problem: RH SAFETY Goal: RH STG ADHERE TO SAFETY PRECAUTIONS W/ASSISTANCE/DEVICE Description: STG Adhere to Safety Precautions With Assistance/Device. Outcome: Progressing   Problem: RH PAIN MANAGEMENT Goal: RH STG PAIN MANAGED AT OR BELOW PT'S PAIN GOAL Description: <4 w/ prns Outcome: Progressing   Problem: RH KNOWLEDGE DEFICIT Goal: RH STG INCREASE KNOWLEDGE OF DIABETES Description: Manage increase knowledge of diabetes with supervision-min assistance from wife using educational materials provided Outcome: Progressing Goal: RH STG INCREASE KNOWLEDGE OF HYPERTENSION Description: Manage increase knowledge of hypertension with supervision-min assistance from wife using educational materials provided Outcome: Progressing Goal: RH STG INCREASE KNOWLEGDE OF HYPERLIPIDEMIA Description: Manage increase knowledge of hyperlipidemia  with supervision-min assistance from wife using educational materials provided Outcome: Progressing

## 2023-11-08 NOTE — Progress Notes (Signed)
 Met with patient to review current situation, team conference and plan of care. Reviewed medications, dietary modifications. Reviewed HTN,HLD ,DM. Reviewed safety. Patient noted to have uncoordinated movements of his left arm. Per patient he eats with his left hand more than his right to have more exercise. Educated patient not to get up by himself. Continue to follow along to provide educational needs to follow up preparation for discharge.

## 2023-11-08 NOTE — Progress Notes (Signed)
 PROGRESS NOTE   Subjective/Complaints:  No acute complaints.  No events overnight.  No family at bedside, patient states that he is causing trouble, but that this is his baseline.  He does complain of some chronic left ankle pain status post old surgery per the patient.  Vital stable.  Labs stable with mild AKI this a.m.  Discussed with patient increasing fluid intakes, which she is agreeable to.  Discussed with prefer water/juices to diet [facility].  ROS: Positives per HPI above. Denies fevers, chills, N/V, abdominal pain, SOB, chest pain, new weakness or paraesthesias.   - Left ankle pain  Objective:   No results found. Recent Labs    11/06/23 0637 11/08/23 0557  WBC 5.8 5.2  HGB 14.5 13.8  HCT 42.9 41.5  PLT 220 205   Recent Labs    11/06/23 0637 11/08/23 0557  NA 140 142  K 3.8 4.1  CL 104 107  CO2 25 26  GLUCOSE 107* 104*  BUN 20 23  CREATININE 1.23 1.37*  CALCIUM  8.8* 8.8*    Intake/Output Summary (Last 24 hours) at 11/08/2023 2234 Last data filed at 11/08/2023 1900 Gross per 24 hour  Intake 720 ml  Output 400 ml  Net 320 ml        Physical Exam: Vital Signs Blood pressure (!) 153/74, pulse (!) 57, temperature 97.9 F (36.6 C), temperature source Oral, resp. rate 17, height 5' 11 (1.803 m), weight 71.1 kg, SpO2 96%.   General: No acute distress.   sitting up in bed Mood and affect are appropriate Heart: Regular rate and rhythm no rubs murmurs or extra sounds Lungs: Clear to auscultation, breathing unlabored, no rales or wheezes Abdomen: Positive bowel sounds, soft nontender to palpation, nondistended Extremities: No clubbing, cyanosis, or edema Skin: No evidence of breakdown, no evidence of rash Neurologic: Cranial nerves II through XII intact, motor strength is 5/5 in RIght and 4/5 left  deltoid, bicep, tricep, grip, hip flexor, knee extensors, ankle dorsiflexor and plantar  flexor  Cerebellar exam normal finger to nose to finger RUE mild dystmetria LUE  Musculoskeletal: + Left lateral ankle scar, limited range of motion in dorsiflexion, plantarflexion.  No apparent deformity, TTP along the lateral malleolus.   Assessment/Plan: 1. Functional deficits which require 3+ hours per day of interdisciplinary therapy in a comprehensive inpatient rehab setting. Physiatrist is providing close team supervision and 24 hour management of active medical problems listed below. Physiatrist and rehab team continue to assess barriers to discharge/monitor patient progress toward functional and medical goals  Care Tool:  Bathing    Body parts bathed by patient: Right arm, Left arm, Abdomen, Chest, Front perineal area, Buttocks, Right upper leg, Left upper leg, Right lower leg, Left lower leg, Face         Bathing assist Assist Level: Contact Guard/Touching assist     Upper Body Dressing/Undressing Upper body dressing   What is the patient wearing?: Pull over shirt    Upper body assist Assist Level: Set up assist    Lower Body Dressing/Undressing Lower body dressing      What is the patient wearing?: Pants, Underwear/pull up     Lower body  assist Assist for lower body dressing: Minimal Assistance - Patient > 75%     Toileting Toileting    Toileting assist Assist for toileting: Contact Guard/Touching assist     Transfers Chair/bed transfer  Transfers assist     Chair/bed transfer assist level: Minimal Assistance - Patient > 75%     Locomotion Ambulation   Ambulation assist      Assist level: Minimal Assistance - Patient > 75% Assistive device: No Device Max distance: 150   Walk 10 feet activity   Assist     Assist level: Contact Guard/Touching assist Assistive device: No Device   Walk 50 feet activity   Assist    Assist level: Contact Guard/Touching assist Assistive device: No Device    Walk 150 feet activity   Assist     Assist level: Minimal Assistance - Patient > 75% Assistive device: No Device    Walk 10 feet on uneven surface  activity   Assist     Assist level: Minimal Assistance - Patient > 75%     Wheelchair     Assist Is the patient using a wheelchair?: Yes (per PT evaluation note) Type of Wheelchair: Manual    Wheelchair assist level: Supervision/Verbal cueing Max wheelchair distance: 200    Wheelchair 50 feet with 2 turns activity    Assist        Assist Level: Supervision/Verbal cueing   Wheelchair 150 feet activity     Assist      Assist Level: Supervision/Verbal cueing   Blood pressure (!) 153/74, pulse (!) 57, temperature 97.9 F (36.6 C), temperature source Oral, resp. rate 17, height 5' 11 (1.803 m), weight 71.1 kg, SpO2 96%.  Medical Problem List and Plan: 1. Functional deficits secondary to right basal ganglia IPH Poor motor control left side alien arm and leg , no sensory loss             -patient may  shower             -ELOS/Goals: 10 to 12 days, supervision PT, min assist OT, supervision SLP             - S stable to continue inpatient rehab   2.  Antithrombotics: -DVT/anticoagulation:  Pharmaceutical: Lovenox              -antiplatelet therapy: N/A at this time.   3. Pain Management: Tylenol  prn.   -10-13: Chronic left lateral ankle pain.  Add Voltaren gel 4 times daily  4. Mood/Behavior/Sleep: LCSW to follow for evaluation and support.              -antipsychotic agents: N/A   5. Neuropsych/cognition: This patient is not capable of making decisions on his own behalf.             - 10-10: Restlessness without agitation.  No unsafe impulsive behaviors seen   6. Skin/Wound Care: Routine pressure relief measures.  7. Fluids/Electrolytes/Nutrition: Monitor I/O. Check CMET in am             --SCr @ 1.31. Encourage fluid intake. --Again discussed with patient 10-13  8. PAF: Monitor HR TID--off coumadin  due to ICH             --family to  decide on Aspire trial (Eliquis  v/s ASA)  9. T2DM: HGb A1C- 6.4 and well controlled on diet alone.          CBG (last 3)  Recent Labs    11/08/23 1206 11/08/23 1700 11/08/23 2023  GLUCAP 95 97 106*  10-13 blood sugars well-controlled on current regimen; DC CBGs  10. HTN: Monitor BP TID. SBP goal , 160. Vitals:   11/08/23 1307 11/08/23 1945  BP: 128/73 (!) 153/74  Pulse: 64 (!) 57  Resp: 18 17  Temp: 98.3 F (36.8 C) 97.9 F (36.6 C)  SpO2: 98% 96%    11. Mild cognitive impairment: Last seen by Neuro 11/22- per family does not have appeared to worsen with ICH  12. Elevated Lipids: Trig-272. VLDL-54. ON              --On Crestor  40 mg, add lovaza- will need repeat trig level in 1 mo         LOS: 3 days A FACE TO FACE EVALUATION WAS PERFORMED  Joesph JAYSON Likes 11/08/2023, 10:34 PM

## 2023-11-08 NOTE — Care Management (Signed)
 Inpatient Rehabilitation Center Individual Statement of Services  Patient Name:  John Woodward.  Date:  11/08/2023  Welcome to the Inpatient Rehabilitation Center.  Our goal is to provide you with an individualized program based on your diagnosis and situation, designed to meet your specific needs.  With this comprehensive rehabilitation program, you will be expected to participate in at least 3 hours of rehabilitation therapies Monday-Friday, with modified therapy programming on the weekends.  Your rehabilitation program will include the following services:  Physical Therapy (PT), Occupational Therapy (OT), 24 hour per day rehabilitation nursing, Therapeutic Recreaction (TR), Psychology, Neuropsychology, Care Coordinator, Rehabilitation Medicine, Nutrition Services, Pharmacy Services, and Other  Weekly team conferences will be held on Tuesday to discuss your progress.  Your Inpatient Rehabilitation Care Coordinator will talk with you frequently to get your input and to update you on team discussions.  Team conferences with you and your family in attendance may also be held.  Expected length of stay: 7-12 days    Overall anticipated outcome: Supervision  Depending on your progress and recovery, your program may change. Your Inpatient Rehabilitation Care Coordinator will coordinate services and will keep you informed of any changes. Your Inpatient Rehabilitation Care Coordinator's name and contact numbers are listed  below.  The following services may also be recommended but are not provided by the Inpatient Rehabilitation Center:  Driving Evaluations Home Health Rehabiltiation Services Outpatient Rehabilitation Services Vocational Rehabilitation   Arrangements will be made to provide these services after discharge if needed.  Arrangements include referral to agencies that provide these services.  Your insurance has been verified to be:  [employer]  Your primary doctor is:   Comer Greet  Pertinent information will be shared with your doctor and your insurance company.  Inpatient Rehabilitation Care Coordinator:  Graeme Jude, KEN [phone] or ([phone]  Information discussed with and copy given to patient by: Graeme DELENA Jude, 11/08/2023, 10:49 AM

## 2023-11-08 NOTE — Progress Notes (Signed)
 Inpatient Rehabilitation  Patient information reviewed and entered into eRehab system by Burnard Mealing, OTR/L, Rehab Quality Coordinator.   Information including medical coding, functional ability and quality indicators will be reviewed and updated through discharge.

## 2023-11-08 NOTE — Evaluation (Signed)
 Speech Language Pathology Assessment and Plan  Patient Details  Name: John Woodward. MRN: [id number] Date of Birth: 04/21/1945  SLP Diagnosis:  (n/a skilled ST not warranted)  Rehab Potential:  (n/a skilled ST not warranted) ELOS: n/a skilled ST not warranted    Today's Date: 11/08/2023 SLP Individual Time: 0810-0900 SLP Individual Time Calculation (min): 50 min and Today's Date: 11/08/2023 SLP Missed Time: 10 Minutes Missed Time Reason: Other (Comment) (delay in care)   Hospital Problem: Principal Problem:   ICH (intracerebral hemorrhage) (HCC)  Past Medical History:  Past Medical History:  Diagnosis Date   Arthritis    Carotid artery disease    Closed fracture dislocation of right elbow    30 years ago -- fell out of theback of a truck   Diabetes mellitus without complication (HCC)    Diverticulosis    Hemorrhoids    Hx of colonic polyps 02/23/2017   Hyperlipidemia    Hypertension    Diagnosed 10 years ago. No on medication. Denies history of CAD.    Memory loss    PAD (peripheral artery disease)    Past Surgical History:  Past Surgical History:  Procedure Laterality Date   OLECRANON BURSECTOMY Right 08/18/2016   Procedure: OLECRANON BURSECTOMY;  Surgeon: Addie Glendia Hacker, MD;  Location: [facility] OR;  Service: Orthopedics;  Laterality: Right;    Assessment / Plan / Recommendation Clinical Impression Pt is a 78 year old male with history of T2DM, MVR, CKD 3b (Dr. Norine), COPD, AAA/B-CAS, PAD w/claudication, PAF- on coumadin , TIA, memory loss who was admitted on 11/01/23 with fall day prior followed by increased weakness, difficulty with balance, facial droop and slurred speech. INR 3.4 at admission and CT head done revealing 1.8 cm right BG hemorrhage. CTA head/neck showed advanced atherosclerosis, no LVO, 80% R-ICA and 76% L-ICA stenosis and small intra-axial hemorrhage with distal L-ICA saccular aneurysm v/s infundibulum and ACA aneurysm 3 mm. Coumadin  reversed and  cleveprex used to keep BP 130-150 range. Patient noted to have right sided weakness and Dr. Rosemarie felt that IPH hypertensive and in setting of warfarin.     2 D echo 06/30/23 showed EF 60-65%, moderate calcification aortic valve, mild ascending aortic dilatation 39 mm and no LVH. CT head repeated on 10/08 showing stable right thalamic/posterior limb internal capsule hemorrhage. Participation in Aspire study v/s ASA discussed with patient and family.     Cognitive-Linguistic: Pt appeared to present w/ moderate cognitive impairments: STM, problem solving, processing, specific word finding, awareness, and organization. He was within 3 days of current date. He endorsed only difficulties with my thoughts. Anticipate he was insinuating thought formulation deficits, however, unable to provide a clear response. He reported that his wife handles all IADLs and he has not driven in a year. Spoke with his wife to determine cognitive baseline, and she reported no change in cognition since CVA. She did note a steady decline in cognition since 2022-2023(?), though any acute changes have resolved. No concerns re expressive/receptive language. Speech was clear and intelligible throughout. She was provided w/ education re potential benefit of HH ST to assist w/ implementation of compensatory strategies in the home environment. Given baseline deficits and adequate assistance in the home environment, skilled ST not warranted at this time.   Bedside Swallow: PO trials completed in conjunction w/ morning meal (regular textures and thin liquids). No overt s/s of airway invasion noted across trials. Adequate oral clearance. Appears to demonstrate adequate oropharyngeal swallow function to continue to maintain  regular diet/thin liquids. Meds whole w/ liquids. Dysphagia intervention not warranted.     Skilled Therapeutic Interventions          SLP facilitated a cognitive-linguistic and bedside swallow evaluation to assess pt's  cognitive-communication skills and determine need for additional skilled ST services. See above for more information.    SLP Assessment  Patient does not need any further Speech Lanaguage Pathology Services    Recommendations  Liquid Administration via: No straw Medication Administration: Whole meds with liquid Supervision: Patient able to self feed Compensations: Small sips/bites;Slow rate;Minimize environmental distractions Postural Changes and/or Swallow Maneuvers: Seated upright 90 degrees;Upright 30-60 min after meal Oral Care Recommendations: Oral care BID Recommendations for Other Services: Therapeutic Recreation consult Therapeutic Recreation Interventions: Pet therapy;Stress management Patient destination: Home Follow up Recommendations: Outpatient SLP Equipment Recommended: None recommended by SLP    SLP Frequency  (n/a skilled ST not warranted)   SLP Duration  SLP Intensity  SLP Treatment/Interventions n/a skilled ST not warranted   (n/a skilled ST not warranted)    N/a skilled ST not warranted   Pain  None reported  Prior Functioning Cognitive/Linguistic Baseline: Baseline deficits Type of Home: House  Lives With: Spouse;Son Available Help at Discharge: Family;Available 24 hours/day  SLP Evaluation Cognition Overall Cognitive Status: History of cognitive impairments - at baseline Arousal/Alertness: Awake/alert Orientation Level: Oriented to person;Oriented to place;Oriented to situation Year: 2025 Month: October Day of Week: Incorrect Attention: Selective Selective Attention: Impaired Selective Attention Impairment: Verbal basic Memory: Impaired Memory Impairment: Storage deficit;Decreased recall of new information;Retrieval deficit;Decreased short term memory Decreased Short Term Memory: Verbal basic;Functional basic Awareness: Impaired Awareness Impairment: Intellectual impairment Problem Solving: Impaired Problem Solving Impairment: Verbal  complex;Functional basic Safety/Judgment: Impaired Comments: anticipate poor awareness/memory deficits negatively impact success  Comprehension Auditory Comprehension Overall Auditory Comprehension: Appears within functional limits for tasks assessed Expression Expression Primary Mode of Expression: Verbal Verbal Expression Overall Verbal Expression: Appears within functional limits for tasks assessed Oral Motor Oral Motor/Sensory Function Overall Oral Motor/Sensory Function: Within functional limits Facial ROM: Within Functional Limits Facial Symmetry: Within Functional Limits Facial Strength: Within Functional Limits Motor Speech Overall Motor Speech: Appears within functional limits for tasks assessed Intelligibility: Intelligible  Care Tool Care Tool Cognition Ability to hear (with hearing aid or hearing appliances if normally used Ability to hear (with hearing aid or hearing appliances if normally used): 0. Adequate - no difficulty in normal conservation, social interaction, listening to TV   Expression of Ideas and Wants Expression of Ideas and Wants: 3. Some difficulty - exhibits some difficulty with expressing needs and ideas (e.g, some words or finishing thoughts) or speech is not clear   Understanding Verbal and Non-Verbal Content Understanding Verbal and Non-Verbal Content: 3. Usually understands - understands most conversations, but misses some part/intent of message. Requires cues at times to understand  Memory/Recall Ability Memory/Recall Ability : Current season;That he or she is in a hospital/hospital unit   Motor Speech Assessment  Intelligibility: Intelligible  Bedside Swallowing Assessment  Oral Care Assessment Level of Consciousness: Alert Thin Liquid Thin Liquid: Within functional limits Presentation: Straw Solid Solid: Within functional limits Presentation: Self Fed BSE Assessment    Short Term Goals: No short term goals set  Refer to Care Plan  for Long Term Goals  Recommendations for other services: Therapeutic Recreation  Pet therapy and Stress management  Discharge Criteria: Patient will be discharged from SLP if patient refuses treatment 3 consecutive times without medical reason, if treatment goals not met, if  there is a change in medical status, if patient makes no progress towards goals or if patient is discharged from hospital.  The above assessment, treatment plan, treatment alternatives and goals were discussed and mutually agreed upon: by patient and by family  Recardo DELENA Mole 11/08/2023, 4:45 PM

## 2023-11-08 NOTE — Progress Notes (Signed)
 Patient ID: John Woodward., male   DOB: [birth year], 78 y.o.   MRN: [id number]  SW met with pt wife in room and discussed ELOS. Fam edu scheduled for Thursday 9am-12pm. SW will provide updates after team conference.   Graeme Jude, MSW, LCSW Office: [phone] Cell: [phone] Fax: [phone]

## 2023-11-09 DIAGNOSIS — I613 Nontraumatic intracerebral hemorrhage in brain stem: Secondary | ICD-10-CM | POA: Diagnosis not present

## 2023-11-09 LAB — GLUCOSE, CAPILLARY: Glucose-Capillary: 103 mg/dL — ABNORMAL HIGH (ref 70–99)

## 2023-11-09 MED ORDER — DONEPEZIL HCL 10 MG PO TABS
5.0000 mg | ORAL_TABLET | Freq: Every day | ORAL | Status: DC
  Administered 2023-11-09 – 2023-11-15 (×7): 5 mg via ORAL
  Filled 2023-11-09 (×7): qty 1

## 2023-11-09 NOTE — Plan of Care (Signed)
  Problem: Consults Goal: RH STROKE PATIENT EDUCATION Description: See Patient Education module for education specifics  Outcome: Progressing   Problem: RH BOWEL ELIMINATION Goal: RH STG MANAGE BOWEL WITH ASSISTANCE Description: STG Manage Bowel with supervision-min Assistance. Outcome: Progressing   Problem: RH BLADDER ELIMINATION Goal: RH STG MANAGE BLADDER WITH ASSISTANCE Description: STG Manage Bladder With supervision-min Assistance Outcome: Progressing   Problem: RH SKIN INTEGRITY Goal: RH STG SKIN FREE OF INFECTION/BREAKDOWN Description: Manage skin free of infection/breakdown with supervision to min assistance Outcome: Progressing   Problem: RH SAFETY Goal: RH STG ADHERE TO SAFETY PRECAUTIONS W/ASSISTANCE/DEVICE Description: STG Adhere to Safety Precautions With Assistance/Device. Outcome: Progressing   Problem: RH PAIN MANAGEMENT Goal: RH STG PAIN MANAGED AT OR BELOW PT'S PAIN GOAL Description: <4 w/ prns Outcome: Progressing   Problem: RH KNOWLEDGE DEFICIT Goal: RH STG INCREASE KNOWLEDGE OF DIABETES Description: Manage increase knowledge of diabetes with supervision-min assistance from wife using educational materials provided Outcome: Progressing Goal: RH STG INCREASE KNOWLEDGE OF HYPERTENSION Description: Manage increase knowledge of hypertension with supervision-min assistance from wife using educational materials provided Outcome: Progressing Goal: RH STG INCREASE KNOWLEGDE OF HYPERLIPIDEMIA Description: Manage increase knowledge of hyperlipidemia  with supervision-min assistance from wife using educational materials provided Outcome: Progressing

## 2023-11-09 NOTE — Patient Care Conference (Signed)
 Inpatient RehabilitationTeam Conference and Plan of Care Update Date: 11/09/2023   Time: 1041 am    Patient Name: John Woodward.      Medical Record Number: [id number]  Date of Birth: Aug 09, 1945 Sex: Male         Room/Bed: 4W08C/4W08C-01 Payor Info: Payor: [employer] MEDICARE / Plan: [employer] MEDICARE HMO/PPO / Product Type: *No Product type* /    Admit Date/Time:  11/05/2023  5:54 PM  Primary Diagnosis:  ICH (intracerebral hemorrhage) [facility])  Hospital Problems: Principal Problem:   ICH (intracerebral hemorrhage) [facility])    Expected Discharge Date: Expected Discharge Date: 11/16/23  Team Members Present: Physician leading conference: Dr. Joesph Likes Social Worker Present: Graeme Jude, LCSW Nurse Present: Eulalio Falls, RN PT Present: Kirt Dawn, PT OT Present: Nereida Habermann, OT SLP Present: Recardo Mole, SLP PPS Coordinator present : Eleanor Colon, SLP     Current Status/Progress Goal Weekly Team Focus  Bowel/Bladder   Patient is continent of bowel and bladder. Last BM 10/10   Patient will remain continent of bowel and bladder.   Assist patient with toileting in a timely manner to maintain continence. Ensure urinal is empty and in reach.    Swallow/Nutrition/ Hydration               ADL's   Supervision for UB ADLs, CGA-Min A for LB ADLs. Education/cuing required for safe pacing. Barriers: Decreased awareness, decreased activity tolerance, decreased attention, decreased L-sided coordination   Supervision   General conditioning, standing balance, LUE NMR, cognitive retraining/awareness, and caregiver education    Mobility   supervision bed mobility, CGA transfers and ambulation x175' with RW   Supervision  Lt hemibody NMR, balance, safety awareness, gait    Communication                Safety/Cognition/ Behavioral Observations               Pain   Patient reports pain to left ankle/foot. Per patient, chronic in nature from prior surgery but  exacerbated by ICH. Patient reports voltaren and tylenol  helps.   Patient will report a pain score of less than 4/10   Assess pain every shift and as needed. Provide pain intervention when necessary.    Skin   Scattered bruising but no open areas of skin breakdown noted.   Patient will remain free of skin breakdown.  Assess skin every shift and as needed. Provide soft barrier on bed rails for left limbs due to involuntary movement.      Discharge Planning:  Pt will d/c to home with his wife as primary caregiver. Fam edu scheduled for Thursday 9am-12pm. SW will confirm there are no barriers to discharge.    Team Discussion: Patient was admitted post right basal ganglia IPH . Patient with history of cognitive decline prior to stroke. Patient with  poor motor control of left side,  alien arm and leg , no sensory loss.  Patient with left ankle pain: medication adjusted by MD. Patient progress limited by no awareness of deficits, attention deficits, decreased left sided coordination and poor activity tolerance.   Patient on target to meet rehab goals: Currently, patient needs supervision assistance with upper body care and needs min assistance with lower body care. Patient needs CGA with transfers and was able to ambulate CGA  up to 175' using a RW. Overall goals at discharge are set for supervision assistance.   *See Care Plan and progress notes for long and short-term goals.  Revisions to Treatment Plan:  N/a   Teaching Needs: Safety, medications, toileting, transfers, etc.   Current Barriers to Discharge: Decreased caregiver support and Home enviroment access/layout  Possible Resolutions to Barriers: Family Education      Medical Summary Current Status: medically complicated by hypertension, hyperglycemia, cognitive impairment, p A fib, and L ankle pain  Barriers to Discharge: Behavior/Mood;Self-care education;Medical stability;Uncontrolled Hypertension   Possible Resolutions  to [employer]: encourage cognitively stimulating activity, treat pain with topical / local medictions   Continued Need for Acute Rehabilitation Level of Care: The patient requires daily medical management by a physician with specialized training in physical medicine and rehabilitation for the following reasons: Direction of a multidisciplinary physical rehabilitation program to maximize functional independence : Yes Medical management of patient stability for increased activity during participation in an intensive rehabilitation regime.: Yes Analysis of laboratory values and/or radiology reports with any subsequent need for medication adjustment and/or medical intervention. : Yes   I attest that I was present, lead the team conference, and concur with the assessment and plan of the team.   Commodore Bellew Gayo 11/09/2023, 1041 am

## 2023-11-09 NOTE — Progress Notes (Signed)
 Physical Therapy Session Note  Patient Details  Name: John Woodward. MRN: [id number] Date of Birth: 1945-08-12  Today's Date: 11/09/2023 PT Individual Time: 0920-1000 and 1300-1400 PT Individual Time Calculation (min): 40 min and 60 min  Short Term Goals: Week 1:  PT Short Term Goal 1 (Week 1): STGs = LTGs  Skilled Therapeutic Interventions/Progress Updates:   First session:  Pt presents sidelying in bed and asleep, but awakens easily and agreeable to therapy.  Pt transfers sup to sit w/ supervision and dons shoes w/ supervision for R LE but min A for LLE 2/2 athetoid movement.  Pt transfers sit to stand w/ CGA.  Pt amb to dayroom w/ min A and w/o AD.  Pt reaches for external support during gait, LUE w/ continual movement.  Pt w/ 1-2 LOB during gait and cueing for objects to left.  BP at 154/76, HR 66 after gait distance of 150+ and pt states some dizziness, nursing aware.  Pt required increased seated rest break and BP 135/76, HR 64.  Pt negotiated cone obstacle course w/ cues for visual scanning.  Pt returned to room w/ min A and transferred sit to supine w/ supervision, bed alarm on and all needs in reach.  Missed time of 5 min 2/2 delay in care.  Second session:Pt presents supine in bed and agreeable to therapy.  Pt wheeled to outside for increased mood and balance and gait on uneven surfaces.  Pt transfers sit to stand w/ CGA but cues for foot placement before initiating transfer as L foot is not always positioned correctly.  Pt amb x 100' w/ min A 2/2 poor placement LLE and decreased BOS.  Pt will stop for standing rest break x 1-2 during gait.  Pt LLE placed in IR 2/2 athetoid movements which decreases balance. Pt performs forward and backward walking x 10' x 3, decreased BOS backwards requiring assist for maintaining balance.  Pt returned inside.  Pt performed sidestepping w/ min A and cues for LLE placement.  Pt performed floor ladder w/ increased LOB noted.  Pt states L knee giving  out although no buckling.  Pt returned to room and amb to bed.  Pt remained supine w/ bed alarm on and all needs in reach, spouse present.      Therapy Documentation Precautions:  Precautions Precautions: Fall Recall of Precautions/Restrictions: Impaired Restrictions Weight Bearing Restrictions Per Provider Order: No General: PT Amount of Missed Time (min): 5 Minutes PT Missed Treatment Reason: Other (Comment) Vital Signs:   Pain:   Mobility:   Locomotion :    Trunk/Postural Assessment :    Balance:   Exercises:   Other Treatments:      Therapy/Group: Individual Therapy  Daiana Vitiello P Taejon Irani 11/09/2023, 10:05 AM

## 2023-11-09 NOTE — Progress Notes (Signed)
 Inpatient Rehabilitation Care Coordinator Assessment and Plan Patient Details  Name: John Woodward. MRN: [id number] Date of Birth: Jun 22, 1945  Today's Date: 11/09/2023  Hospital Problems: Principal Problem:   ICH (intracerebral hemorrhage) (HCC)  Past Medical History:  Past Medical History:  Diagnosis Date   Arthritis    Carotid artery disease    Closed fracture dislocation of right elbow    30 years ago -- fell out of theback of a truck   Diabetes mellitus without complication (HCC)    Diverticulosis    Hemorrhoids    Hx of colonic polyps 02/23/2017   Hyperlipidemia    Hypertension    Diagnosed 10 years ago. No on medication. Denies history of CAD.    Memory loss    PAD (peripheral artery disease)    Past Surgical History:  Past Surgical History:  Procedure Laterality Date   OLECRANON BURSECTOMY Right 08/18/2016   Procedure: OLECRANON BURSECTOMY;  Surgeon: Addie Glendia Hacker, MD;  [facility] OR;  Service: Orthopedics;  Laterality: Right;   Social History:  reports that he quit smoking about 57 years ago. His smoking use included cigarettes. He started smoking about 16 months ago. He has never used smokeless tobacco. He reports that he does not drink alcohol and does not use drugs.  Family / Support Systems Marital Status: Married How Long?: 42 years Patient Roles: Spouse, Parent Spouse/Significant Other: Virginia  (wife) [phone] Children: Blended family. Total of 4 children, Patient's dtr is deceased.  Pt wife has one child from previous relationship-dtr  who is a Engineer, civil (consulting) that lives in [place]. They have two children together- Alm (at the home daily) and Bernardino (lives in [place]). Reports children will help PRN. Other Supports: none reported Anticipated Caregiver: Wife Virginia  Ability/Limitations of Caregiver: Pt wife will be primary caregiver. Pt needs to be as independent as possible,. Caregiver Availability: 24/7 Family Dynamics: Pt lives with his  wife.  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 He is not a Cytogeneticist. Education: high school grad Health Literacy - How often do you need to have someone help you when you read instructions, pamphlets, or other written material from your doctor or pharmacy?: Never Writes: Yes Employment Status: Retired Marine scientist Issues: Denies Guardian/Conservator: Product manager- wife Virginia    Abuse/Neglect Abuse/Neglect Assessment Can Be Completed: Yes Physical Abuse: Denies Verbal Abuse: Denies Sexual Abuse: Denies Exploitation of patient/patient's resources: Denies Self-Neglect: Denies  Patient response to: Social Isolation - How often do you feel lonely or isolated from those around you?: Never  Emotional Status Pt's affect, behavior and adjustment status: Pt in good spirits at time of visit Recent Psychosocial Issues: Denies Psychiatric History: Denies Substance Abuse History: Pt admits he quit smoking cigarettes 2-3 years ago. Denies etoh and rec drug use.  Patient / Family Perceptions, Expectations & Goals Pt/Family understanding of illness & functional limitations: Pt and wife have a general understanding of care needs Premorbid pt/family roles/activities: Independent Anticipated changes in roles/activities/participation: Assistance with ADLs/IADLs Pt/family expectations/goals: Pt goal is to work on getting out of here and his left side.  Community Resources [employer]: None Premorbid Home Care/DME Agencies: None Transportation available at discharge: TBD Is the patient able to respond to transportation needs?: Yes In the past 12 months, has lack of transportation kept you from medical appointments or from getting medications?: No In the past 12 months, has lack of  transportation kept you from meetings, work, or  from getting things needed for daily living?: No Resource referrals recommended: Neuropsychology  Discharge Planning Living Arrangements: Spouse/significant other Support Systems: Spouse/significant other Type of Residence: Private residence Insurance Resources: Media planner (specify) Administrator Medicare) Financial Resources: Restaurant manager, fast food Screen Referred: No Living Expenses: Banker Management: Spouse Does the patient have any problems obtaining your medications?: No Home Management: Wife manages all home care needs. Patient/Family Preliminary Plans: No changes Care Coordinator Barriers to Discharge: Decreased caregiver support, Lack of/limited family support, Insurance for SNF coverage Care Coordinator Anticipated Follow Up Needs: HH/OP Expected length of stay: D/c date 10/21  Clinical Impression Pt is in good spirits. DME- rollator and grabbers.  SW met with pt and pt wife to provide updates from team conference, and d/c date 10/21. Fam edu remains TR 9am-12pm. SW will confirm find d/c recs.   Kiandra Sanguinetti A Glorie Dowlen 11/09/2023, 4:16 PM

## 2023-11-09 NOTE — Progress Notes (Signed)
 Occupational Therapy Session Note  Patient Details  Name: John Woodward. MRN: [id number] Date of Birth: [birth year]  Today's Date: 11/09/2023 OT Individual Time: 1105-1200 OT Individual Time Calculation (min): 55 min   Today's Date: 11/09/2023 OT Individual Time: 8594-8548 OT Individual Time Calculation (min): 46 min   Short Term Goals: Week 1:  OT Short Term Goal 1 (Week 1): Patient will complete 3/3 toileting tasks with CGA OT Short Term Goal 2 (Week 1): Patient will demonstrate improved safety awareness by locking wc breaks prior to stand with min cues OT Short Term Goal 3 (Week 1): Patient will complete LB dressing with CGA.  Skilled Therapeutic Interventions/Progress Updates:   Session 1: Pt greeted resting in bed for skilled OT session with focus on functional mobility, dynamic standing balance, and general conditioning.  Pain: Pt with un-rated L-ankle pain, OT offering intermediate rest breaks and positioning suggestions throughout session to address pain/fatigue and maximize participation/safety in session.   Functional Transfers: Sit<>stands and ambulatory transfer from room>day room with use of RW at close supervision. Return to room with CGA-Min A without use of RW.   Self Care Tasks: Standing toileting with CGA + intermediate unilateral UE support.   Therapeutic Activities: Pt instructed in BLE coordination activity using colored/numbered visual targets on the floor. Pt tasked to tap visual target as dictated by therapist (I.e left foot, purple), targets placed in front semi-circle layout. Activity upgraded to use of cones to further challenge step-height. CGA to Min A (L HHA) provided for standing balance. Ankle weights donned (2-4#) for proprioceptive input. Pt then completes 2 x 1 min cycles of ball kicks with LLE to further target coordination of limb as a means of decreasing fall risk.   Therapeutic Exercise: Pt completes x8 mins of BUE/BLE strength/endurance  training with use of Nustep modality for management of decreased activity tolerance. OT dons theraband on B-thighs to prevent LLE abduction with activity. Pt tolerates level 3 resistance with x2 rest-breaks.   Pt remained sitting EOB eating lunch with 4Ps assessed and immediate needs met. Pt continues to be appropriate for skilled OT intervention to promote further functional independence in ADLs/IADLs.   Session 2:  Pt greeted siting in [facility] for skilled OT session with focus on LUE NMR.   Pain: Pt with no reports of pain. OT offering intermediate rest breaks and positioning suggestions throughout session to address pain/fatigue and maximize participation/safety in session.   Functional Transfers: Dependent transport in [facility] for time management.   Self Care Tasks: Standing toileting with CGA + intermediate UE support on grab bar.   Therapeutic Activities/Exercises: Pt instructed in Box and Blocks Test for assessment of gross manual dexterity. Pt averages 20 blocks. Pt then performs series of [facility] activities with LUE targeting purposeful/functional movements, details below: Stacking 1x1in blocks by twos Picking stacks of of two blocks with knocking surrounding structures Scooping/transporting ice chips---moderate spillage when distance between cups increased.  Pt remained resting in bed with 4Ps assessed and immediate needs met. Pt continues to be appropriate for skilled OT intervention to promote further functional independence in ADLs/IADLs.   Therapy Documentation Precautions:  Precautions Precautions: Fall Recall of Precautions/Restrictions: Impaired Restrictions Weight Bearing Restrictions Per Provider Order: No   Therapy/Group: Individual Therapy  Nereida Habermann, OTR/L, MSOT  11/09/2023, 7:57 AM

## 2023-11-09 NOTE — IPOC Note (Signed)
 Overall Plan of Care (IPOC) Patient Details Name: John Woodward. MRN: [id number] DOB: 15-Mar-1945  Admitting Diagnosis: ICH (intracerebral hemorrhage) [facility])  Hospital Problems: Principal Problem:   ICH (intracerebral hemorrhage) (HCC)     Functional Problem List: Nursing Bladder, Bowel, Edema, Endurance, Medication Management, Safety  PT Balance, Endurance, Safety, Perception  OT Balance, Cognition, Endurance, Motor, Safety  SLP  (n/a skilled ST not warranted)  TR         Basic ADL's: OT Eating, Grooming, Bathing, Dressing, Toileting     Advanced  ADL's: OT None     Transfers: PT Bed Mobility, Bed to Chair, Customer service manager, Tub/Shower     Locomotion: PT Ambulation, Stairs     Additional Impairments: OT Fuctional Use of Upper Extremity  SLP        TR      Anticipated Outcomes Item Anticipated Outcome  Self Feeding Mod I  Swallowing      Basic self-care  Mod I/supervision  Toileting  Supervision   Bathroom Transfers Supervision  Bowel/Bladder  manage bowel with medications/manage bladdder with toileting assistance  Transfers  S transfers  Locomotion  S ambulation, S stairs  Communication     Cognition     Pain  <4 w/ prns  Safety/Judgment  manage safety with supervision-min assistance   Therapy Plan: PT Intensity: Minimum of 1-2 x/day ,45 to 90 minutes PT Frequency: 5 out of 7 days PT Duration Estimated Length of Stay: 7 to 9 days OT Intensity: Minimum of 1-2 x/day, 45 to 90 minutes OT Frequency: 5 out of 7 days OT Duration/Estimated Length of Stay: 10-12 SLP Intensity:  (n/a skilled ST not warranted) SLP Frequency:  (n/a skilled ST not warranted) SLP Duration/Estimated Length of Stay: n/a skilled ST not warranted   Team Interventions: Nursing Interventions Patient/Family Education, Bladder Management, Bowel Management, Disease Management/Prevention, Pain Management, Medication Management, Discharge Planning  PT interventions  Ambulation/gait training, Community reintegration, DME/adaptive equipment instruction, Neuromuscular re-education, Stair training, UE/LE Strength taining/ROM, Warden/ranger, Disease management/prevention, Functional mobility training, Patient/family education, Therapeutic Exercise, UE/LE Coordination activities, Therapeutic Activities  OT Interventions Warden/ranger, Cognitive remediation/compensation, Community reintegration, Discharge planning, Disease mangement/prevention, DME/adaptive equipment instruction, Functional electrical stimulation, Functional mobility training, Neuromuscular re-education, Pain management, Patient/family education, Psychosocial support, Self Care/advanced ADL retraining, Skin care/wound managment, Splinting/orthotics, Therapeutic Activities, Therapeutic Exercise, UE/LE Strength taining/ROM, UE/LE Coordination activities, Visual/perceptual remediation/compensation, Wheelchair propulsion/positioning  SLP Interventions    TR Interventions    SW/CM Interventions Discharge Planning, Psychosocial Support, Patient/Family Education   Barriers to Discharge MD  Medical stability, Home enviroment access/loayout, Lack of/limited family support, and Behavior  Nursing Decreased caregiver support, Home environment access/layout Discharge Living Setting: Patient's home, House, Lives with (comment) (wife)  Type of Home at Discharge: House  Discharge Home Layout: One level  Discharge Home Access: Stairs to enter  Entrance Stairs-Rails: Right, Left, Can reach both  Entrance Stairs-Number of Steps: 5  PT Inaccessible home environment    OT None    SLP      SW       Team Discharge Planning: Destination: PT-Home ,OT- Home , SLP-Home Projected Follow-up: PT-Home health PT, OT-  Outpatient OT, SLP-Outpatient SLP Projected Equipment Needs: PT-To be determined, OT- To be determined, SLP-None recommended by SLP Equipment Details: PT- , OT-TBD Patient/family  involved in discharge planning: PT- Patient,  OT-Patient, Family member/caregiver, SLP-Patient, Family member/caregiver  MD ELOS: 10-12 days Medical Rehab Prognosis:  Excellent Assessment: The patient has been admitted for CIR therapies  with the diagnosis of IPH. The team will be addressing functional mobility, strength, stamina, balance, safety, adaptive techniques and equipment, self-care, bowel and bladder mgt, patient and caregiver education, . Goals have been set at Min A OT, SPV PT/SLP. Anticipated discharge destination is home.       See Team Conference Notes for weekly updates to the plan of care

## 2023-11-09 NOTE — Progress Notes (Signed)
 PROGRESS NOTE   Subjective/Complaints:  Ne events overnight.  Patient endorses improvement in his left ankle pain with Voltaren gel.   Vitals stable. Continent b/b.   Wife at bedside, endorses approximately 1 to 2 years of cognitive decline prior to stroke.  States he is actually seems a bit better in the hospital than he has been in a while.    ROS: Positives per HPI above. Denies fevers, chills, N/V, abdominal pain, SOB, chest pain, new weakness or paraesthesias.   - Left ankle pain - Restlessness - Cognitive decline  Objective:   No results found. Recent Labs    11/08/23 0557  WBC 5.2  HGB 13.8  HCT 41.5  PLT 205   Recent Labs    11/08/23 0557  NA 142  K 4.1  CL 107  CO2 26  GLUCOSE 104*  BUN 23  CREATININE 1.37*  CALCIUM  8.8*    Intake/Output Summary (Last 24 hours) at 11/09/2023 0929 Last data filed at 11/09/2023 0443 Gross per 24 hour  Intake 580 ml  Output 650 ml  Net -70 ml        Physical Exam: Vital Signs Blood pressure 128/69, pulse (!) 57, temperature 98.5 F (36.9 C), temperature source Oral, resp. rate 17, height 5' 11 (1.803 m), weight 71.5 kg, SpO2 91%.   General: No acute distress.  Laying in bed Mood and affect are appropriate Heart: Regular rate and rhythm no rubs murmurs or extra sounds Lungs: Clear to auscultation, breathing unlabored, no rales or wheezes Abdomen: Positive bowel sounds, soft nontender to palpation, nondistended Extremities: No clubbing, cyanosis, or edema Skin: No evidence of breakdown, no evidence of rash Neurologic: Cranial nerves II through XII intact, motor strength is 5/5 in RIght and 4/5 left  deltoid, bicep, tricep, grip, hip flexor, knee extensors, ankle dorsiflexor and plantar flexor + Motor restlessness with left upper and lower extremity  Cerebellar exam normal finger to nose to finger RUE mild dystmetria LUE  Musculoskeletal: + Left lateral  ankle scar, limited range of motion in dorsiflexion, plantarflexion.  No apparent deformity, TTP along the lateral malleolus.--Improved 10-14    Assessment/Plan: 1. Functional deficits which require 3+ hours per day of interdisciplinary therapy in a comprehensive inpatient rehab setting. Physiatrist is providing close team supervision and 24 hour management of active medical problems listed below. Physiatrist and rehab team continue to assess barriers to discharge/monitor patient progress toward functional and medical goals  Care Tool:  Bathing    Body parts bathed by patient: Right arm, Left arm, Abdomen, Chest, Front perineal area, Buttocks, Right upper leg, Left upper leg, Right lower leg, Left lower leg, Face         Bathing assist Assist Level: Contact Guard/Touching assist     Upper Body Dressing/Undressing Upper body dressing   What is the patient wearing?: Pull over shirt    Upper body assist Assist Level: Set up assist    Lower Body Dressing/Undressing Lower body dressing      What is the patient wearing?: Pants, Underwear/pull up     Lower body assist Assist for lower body dressing: Minimal Assistance - Patient > 75%     Toileting Toileting  Toileting assist Assist for toileting: Contact Guard/Touching assist     Transfers Chair/bed transfer  Transfers assist     Chair/bed transfer assist level: Minimal Assistance - Patient > 75%     Locomotion Ambulation   Ambulation assist      Assist level: Minimal Assistance - Patient > 75% Assistive device: No Device Max distance: 150   Walk 10 feet activity   Assist     Assist level: Contact Guard/Touching assist Assistive device: No Device   Walk 50 feet activity   Assist    Assist level: Contact Guard/Touching assist Assistive device: No Device    Walk 150 feet activity   Assist    Assist level: Minimal Assistance - Patient > 75% Assistive device: No Device    Walk 10 feet  on uneven surface  activity   Assist     Assist level: Minimal Assistance - Patient > 75%     Wheelchair     Assist Is the patient using a wheelchair?: Yes (per PT evaluation note) Type of Wheelchair: Manual    Wheelchair assist level: Supervision/Verbal cueing Max wheelchair distance: 200    Wheelchair 50 feet with 2 turns activity    Assist        Assist Level: Supervision/Verbal cueing   Wheelchair 150 feet activity     Assist      Assist Level: Supervision/Verbal cueing   Blood pressure 128/69, pulse (!) 57, temperature 98.5 F (36.9 C), temperature source Oral, resp. rate 17, height 5' 11 (1.803 m), weight 71.5 kg, SpO2 91%.  Medical Problem List and Plan: 1. Functional deficits secondary to right basal ganglia IPH Poor motor control left side alien arm and leg , no sensory loss             -patient may  shower             -ELOS/Goals: 10 to 12 days, supervision PT, min assist OT, supervision SLP - 10/21             - stable to continue inpatient rehab   - 10/14: SPV for UB, Min A LB, no awareness of deficits and poor activity tolerance. Attention deficits. CGA transfers and ambulation up to 175 with RW. L sided inattention limiting. At cognitive baseline--per wife has been managing his ADLs since 2023 and he does not drive.    2.  Antithrombotics: -DVT/anticoagulation:  Pharmaceutical: Lovenox              -antiplatelet therapy: N/A at this time.   3. Pain Management: Tylenol  prn.   -10-13: Chronic left lateral ankle pain.  Add Voltaren gel 4 times daily  4. Mood/Behavior/Sleep: LCSW to follow for evaluation and support.              -antipsychotic agents: N/A   5. Neuropsych/cognition: This patient is not capable of making decisions on his own behalf.             - 10-10: Restlessness without agitation.  No unsafe impulsive behaviors seen   - 10/14: Cog deficits progressive since 2023; start donepezil 5 mg daily after discussing with patient  and his wife   6. Skin/Wound Care: Routine pressure relief measures.  7. Fluids/Electrolytes/Nutrition: Monitor I/O. Check CMET in am             --SCr @ 1.31. Encourage fluid intake. --Again discussed with patient 10-13  8. PAF: Monitor HR TID--off coumadin  due to ICH             --  family to decide on Aspire trial (Eliquis  v/s ASA)  9. T2DM: HGb A1C- 6.4 and well controlled on diet alone.          CBG (last 3)  Recent Labs    11/08/23 1700 11/08/23 2023 11/09/23 0628  GLUCAP 97 106* 103*  10-13 blood sugars well-controlled on current regimen; DC CBGs  10. HTN: Monitor BP TID. SBP goal , 160. Vitals:   11/08/23 1945 11/09/23 0442  BP: (!) 153/74 128/69  Pulse: (!) 57 (!) 57  Resp: 17 17  Temp: 97.9 F (36.6 C) 98.5 F (36.9 C)  SpO2: 96% 91%    11. Mild cognitive impairment: Last seen by Neuro 11/22- per family does not have appeared to worsen with ICH  12. Elevated Lipids: Trig-272. VLDL-54. ON              --On Crestor  40 mg, add lovaza- will need repeat trig level in 1 mo    LOS: 4 days A FACE TO FACE EVALUATION WAS PERFORMED  John Woodward 11/09/2023, 9:29 AM

## 2023-11-10 DIAGNOSIS — I613 Nontraumatic intracerebral hemorrhage in brain stem: Secondary | ICD-10-CM | POA: Diagnosis not present

## 2023-11-10 MED ORDER — AMLODIPINE BESYLATE 10 MG PO TABS
10.0000 mg | ORAL_TABLET | Freq: Every day | ORAL | Status: DC
Start: 2023-11-11 — End: 2023-11-16
  Administered 2023-11-11 – 2023-11-16 (×6): 10 mg via ORAL
  Filled 2023-11-10: qty 2
  Filled 2023-11-10 (×5): qty 1

## 2023-11-10 MED ORDER — HYDRALAZINE HCL 25 MG PO TABS: 25.0000 mg | ORAL_TABLET | Freq: Three times a day (TID) | ORAL | Status: DC | PRN

## 2023-11-10 NOTE — Progress Notes (Signed)
 Occupational Therapy Session Note  Patient Details  Name: John Woodward. MRN: [id number] Date of Birth: 1945/07/18  Today's Date: 11/10/2023 OT Individual Time: 8994-8884 OT Individual Time Calculation (min): 70 min   Today's Date: 11/10/2023 OT Individual Time: 1420-1530 OT Individual Time Calculation (min): 70 min   Short Term Goals: Week 1:  OT Short Term Goal 1 (Week 1): Patient will complete 3/3 toileting tasks with CGA OT Short Term Goal 2 (Week 1): Patient will demonstrate improved safety awareness by locking wc breaks prior to stand with min cues OT Short Term Goal 3 (Week 1): Patient will complete LB dressing with CGA.  Skilled Therapeutic Interventions/Progress Updates:   Session 1: Pt greeted sitting in [facility], spouse present at side, no reports of ankle pain this session, positive response to Voletran ointment. Ambulatory bathroom transfers with CGA-Min A + no AD. Sit<>stands with CGA-close supervision. Pt bathes with overall supervision + use of grab bar for standing balance. Pt requires cuing for safety awareness in environment anticipate due wanting to seek private. Pt dresses in similar fashion for standing balance assistance. Pt ambulates from room>main therapy gym with use of RW, increased cuing for safe RW management.   In main therapy gym, patient completes 3x1 min cycles of step-ups onto 6 steps with BUE support, targeting BLE coordination and endurance to decrease fall-risk. CGA provided with visual targets on stept/floor to increase challenge to LLE. Pt then ambulates from main therapy gym>room with Min + no AD, due to LLE ataxia and ankle instability. OT applies Voltaren to ankle at end of session. Pt remains sitting in WC, posey belt activated, spouse/visitor present at side.   Session 2: Pt greeted sitting resting in bed, reports of ankle pain, medication/Voltaren gel applied to area. Stand-pivot from EOB<>WC with supervision + no AD. Dependent transport to all  therapy locations in [facility] for energy conservation. At table top, patient instructed in series of LUE FMC/dexterity activities, details below: Nuts & Bolts Board Purdue Pegboard (1.5# wrist weight added for proprioceptive input) 2x1 mins of ball toss for BUE integration and reaction times   3/3 standing toileting tasks with CGA for balance. Patient remained resting in bed with all immediate needs met.   Therapy Documentation Precautions:  Precautions Precautions: Fall Recall of Precautions/Restrictions: Impaired Restrictions Weight Bearing Restrictions Per Provider Order: No   Therapy/Group: Individual Therapy  Nereida Habermann, OTR/L, MSOT  11/10/2023, 10:53 AM

## 2023-11-10 NOTE — Progress Notes (Signed)
 PROGRESS NOTE   Subjective/Complaints:  Ne events overnight.  No acute complaints. Vital stable.  More talkative, engaging this a.m. than prior.  Discussed his hobby of fixing classic cars.  ROS: Positives per HPI above. Denies fevers, chills, N/V, abdominal pain, SOB, chest pain, new weakness or paraesthesias.   - Left ankle pain--improved - Restlessness - Cognitive decline  Objective:   No results found. Recent Labs    11/08/23 0557  WBC 5.2  HGB 13.8  HCT 41.5  PLT 205   Recent Labs    11/08/23 0557  NA 142  K 4.1  CL 107  CO2 26  GLUCOSE 104*  BUN 23  CREATININE 1.37*  CALCIUM  8.8*    Intake/Output Summary (Last 24 hours) at 11/10/2023 2318 Last data filed at 11/10/2023 1800 Gross per 24 hour  Intake 480 ml  Output 350 ml  Net 130 ml        Physical Exam: Vital Signs Blood pressure (!) 154/65, pulse (!) 51, temperature 98.4 F (36.9 C), temperature source Oral, resp. rate 18, height 5' 11 (1.803 m), weight 71.5 kg, SpO2 95%.   General: No acute distress.  Sitting up in bed. Mood and affect are appropriate Heart: Regular rate and rhythm no rubs murmurs or extra sounds Lungs: Clear to auscultation, breathing unlabored, no rales or wheezes Abdomen: Positive bowel sounds, soft nontender to palpation, nondistended Extremities: No clubbing, cyanosis, or edema Skin: No evidence of breakdown, no evidence of rash Neurologic: Cranial nerves II through XII intact, motor strength is 5/5 in RIght and 4/5 left  deltoid, bicep, tricep, grip, hip flexor, knee extensors, ankle dorsiflexor and plantar flexor--ongoing + Motor restlessness with left upper and lower extremity--ongoing + Poor attention/memory--slightly improved 10-15  Cerebellar exam normal finger to nose to finger RUE mild dystmetria LUE  Musculoskeletal: + Left lateral ankle scar, limited range of motion in dorsiflexion, plantarflexion.  No  apparent deformity, TTP along the lateral malleolus.    Assessment/Plan: 1. Functional deficits which require 3+ hours per day of interdisciplinary therapy in a comprehensive inpatient rehab setting. Physiatrist is providing close team supervision and 24 hour management of active medical problems listed below. Physiatrist and rehab team continue to assess barriers to discharge/monitor patient progress toward functional and medical goals  Care Tool:  Bathing    Body parts bathed by patient: Right arm, Left arm, Abdomen, Chest, Front perineal area, Buttocks, Right upper leg, Left upper leg, Right lower leg, Left lower leg, Face         Bathing assist Assist Level: Contact Guard/Touching assist     Upper Body Dressing/Undressing Upper body dressing   What is the patient wearing?: Pull over shirt    Upper body assist Assist Level: Set up assist    Lower Body Dressing/Undressing Lower body dressing      What is the patient wearing?: Pants, Underwear/pull up     Lower body assist Assist for lower body dressing: Minimal Assistance - Patient > 75%     Toileting Toileting    Toileting assist Assist for toileting: Contact Guard/Touching assist     Transfers Chair/bed transfer  Transfers assist     Chair/bed transfer assist  level: Minimal Assistance - Patient > 75%     Locomotion Ambulation   Ambulation assist      Assist level: Moderate Assistance - Patient 50 - 74% Assistive device: No Device Max distance: 120   Walk 10 feet activity   Assist     Assist level: Moderate Assistance - Patient - 50 - 74% Assistive device: No Device   Walk 50 feet activity   Assist    Assist level: Moderate Assistance - Patient - 50 - 74% Assistive device: No Device    Walk 150 feet activity   Assist    Assist level: Minimal Assistance - Patient > 75% Assistive device: No Device    Walk 10 feet on uneven surface  activity   Assist     Assist level:  Moderate Assistance - Patient - 50 - 74% Assistive device: Other (comment) (no device.)   Wheelchair     Assist Is the patient using a wheelchair?: Yes (per PT evaluation note) Type of Wheelchair: Manual    Wheelchair assist level: Supervision/Verbal cueing Max wheelchair distance: 200    Wheelchair 50 feet with 2 turns activity    Assist        Assist Level: Supervision/Verbal cueing   Wheelchair 150 feet activity     Assist      Assist Level: Supervision/Verbal cueing   Blood pressure (!) 154/65, pulse (!) 51, temperature 98.4 F (36.9 C), temperature source Oral, resp. rate 18, height 5' 11 (1.803 m), weight 71.5 kg, SpO2 95%.  Medical Problem List and Plan: 1. Functional deficits secondary to right basal ganglia IPH Poor motor control left side alien arm and leg , no sensory loss             -patient may  shower             -ELOS/Goals: 10 to 12 days, supervision PT, min assist OT, supervision SLP - 10/21             - stable to continue inpatient rehab   - 10/14: SPV for UB, Min A LB, no awareness of deficits and poor activity tolerance. Attention deficits. CGA transfers and ambulation up to 175 with RW. L sided inattention limiting. At cognitive baseline--per wife has been managing his ADLs since 2023 and he does not drive.    2.  Antithrombotics: -DVT/anticoagulation:  Pharmaceutical: Lovenox --plan to start aspire trial on discharge, will reach out to Dr. Rosemarie  10-20             -antiplatelet therapy: N/A at this time.   3. Pain Management: Tylenol  prn.   -10-13: Chronic left lateral ankle pain.  Add Voltaren gel 4 times daily--improved  4. Mood/Behavior/Sleep: LCSW to follow for evaluation and support.              -antipsychotic agents: N/A   5. Neuropsych/cognition: This patient is not capable of making decisions on his own behalf.             - 10-10: Restlessness without agitation.  No unsafe impulsive behaviors seen   - 10/14: Cog deficits  progressive since 2023; start donepezil 5 mg daily after discussing with patient and his wife--seems slightly improved 10-15   6. Skin/Wound Care: Routine pressure relief measures.  7. Fluids/Electrolytes/Nutrition: Monitor I/O. Check CMET in am             --SCr @ 1.31. Encourage fluid intake. --Again discussed with patient 10-13   - Labs in a.m.  8. PAF: Monitor HR TID--off coumadin  due to ICH             --family to decide on Aspire trial (Eliquis  v/s ASA)  9. T2DM: HGb A1C- 6.4 and well controlled on diet alone.          CBG (last 3)  Recent Labs    11/08/23 1700 11/08/23 2023 11/09/23 0628  GLUCAP 97 106* 103*  10-13 blood sugars well-controlled on current regimen; DC CBGs  10. HTN: Monitor BP TID. SBP goal , 160.   - 10-15: Increasing hypertension, heart rate low.  Resume amlodipine  10 mg daily.  Continue hold losartan  50 mg daily in setting of AKI. Vitals:   11/10/23 2048 11/10/23 2312  BP: (!) 171/72 (!) 154/65  Pulse: (!) 55 (!) 51  Resp:    Temp:    SpO2:      11. Mild cognitive impairment: Last seen by Neuro 11/22- per family does not have appeared to worsen with ICH  12. Elevated Lipids: Trig-272. VLDL-54. ON              --On Crestor  40 mg, add lovaza- will need repeat trig level in 1 mo    LOS: 5 days A FACE TO FACE EVALUATION WAS PERFORMED  John Woodward 11/10/2023, 11:18 PM

## 2023-11-10 NOTE — Progress Notes (Signed)
 Physical Therapy Session Note  Patient Details  Name: John Woodward. MRN: [id number] Date of Birth: 16-Apr-1945  Today's Date: 11/10/2023 PT Individual Time: 0800-0859 PT Individual Time Calculation (min): 59 min   Short Term Goals: Week 1:  PT Short Term Goal 1 (Week 1): STGs = LTGs  Skilled Therapeutic Interventions/Progress Updates:      Pt lying in bed to start - in agreement to therapy treatment. Reports chronic L foot pain, unrated. Mobility and distarction provided for pain support.   Bed mobility completed at supervision level. Able to don his shoes at EOB with + time due to athetoid movement and L sided deficits. Able to tie his shoes as well, with + time. Stand pivot transfer with CGA from bed to w/c with cues for hand placement and safety.   Transported to main gym to work on functional gait training, stair training, and review car transfers.   Gait training 246ft with RW with light minA, including turns to his R. Min cues for keeping body within walker frame, taking his time with turns, and keeping RW in front of him while turning to sit.   Gait training 34ft with no AD or UE support with minA for balance and trunk support. Increased athetoid movement on his L side compared to when using RW. Similar cues as above.   Stair training using 6 steps and 2 hand rails with CGA - reciprocal stepping for ascent and step-to pattern for descent. Cues for general safety, to monitor L foot placement to ensure his full foot on the step before proceeding.   Car transfer completed at [facility] level with cues for general safety and technique - car height set to lowest position - pt has several options for cars at home.    Therapy Documentation Precautions:  Precautions Precautions: Fall Recall of Precautions/Restrictions: Impaired Restrictions Weight Bearing Restrictions Per Provider Order: No General:     Therapy/Group: Individual Therapy  Sherlean SHAUNNA Perks 11/10/2023,  7:38 AM

## 2023-11-10 NOTE — Progress Notes (Signed)
 Orthopedic Tech Progress Note Patient Details:  John Woodward 04-18-1945 [id number]  Ortho Devices Type of Ortho Device: ASO Ortho Device/Splint Location: LLE Ortho Device/Splint Interventions: Ordered, Other (comment)patient was not in room when I went to room. So I left box on bedside table    Post Interventions Patient Tolerated: Well Instructions Provided: Care of device  [name] Pac 11/10/2023, 3:17 PM

## 2023-11-10 NOTE — Plan of Care (Signed)
  Problem: Consults Goal: RH STROKE PATIENT EDUCATION Description: See Patient Education module for education specifics  Outcome: Progressing   Problem: RH BOWEL ELIMINATION Goal: RH STG MANAGE BOWEL WITH ASSISTANCE Description: STG Manage Bowel with supervision-min Assistance. Outcome: Progressing   Problem: RH BLADDER ELIMINATION Goal: RH STG MANAGE BLADDER WITH ASSISTANCE Description: STG Manage Bladder With supervision-min Assistance Outcome: Progressing   Problem: RH SKIN INTEGRITY Goal: RH STG SKIN FREE OF INFECTION/BREAKDOWN Description: Manage skin free of infection/breakdown with supervision to min assistance Outcome: Progressing   Problem: RH SAFETY Goal: RH STG ADHERE TO SAFETY PRECAUTIONS W/ASSISTANCE/DEVICE Description: STG Adhere to Safety Precautions With Assistance/Device. Outcome: Progressing   Problem: RH PAIN MANAGEMENT Goal: RH STG PAIN MANAGED AT OR BELOW PT'S PAIN GOAL Description: <4 w/ prns Outcome: Progressing   Problem: RH KNOWLEDGE DEFICIT Goal: RH STG INCREASE KNOWLEDGE OF DIABETES Description: Manage increase knowledge of diabetes with supervision-min assistance from wife using educational materials provided Outcome: Progressing Goal: RH STG INCREASE KNOWLEDGE OF HYPERTENSION Description: Manage increase knowledge of hypertension with supervision-min assistance from wife using educational materials provided Outcome: Progressing Goal: RH STG INCREASE KNOWLEGDE OF HYPERLIPIDEMIA Description: Manage increase knowledge of hyperlipidemia  with supervision-min assistance from wife using educational materials provided Outcome: Progressing

## 2023-11-11 ENCOUNTER — Other Ambulatory Visit (HOSPITAL_COMMUNITY): Payer: Self-pay

## 2023-11-11 DIAGNOSIS — I613 Nontraumatic intracerebral hemorrhage in brain stem: Secondary | ICD-10-CM | POA: Diagnosis not present

## 2023-11-11 LAB — CBC
HCT: 42 % (ref 39.0–52.0)
Hemoglobin: 14.4 g/dL (ref 13.0–17.0)
MCH: 32.6 pg (ref 26.0–34.0)
MCHC: 34.3 g/dL (ref 30.0–36.0)
MCV: 95 fL (ref 80.0–100.0)
Platelets: 225 K/uL (ref 150–400)
RBC: 4.42 MIL/uL (ref 4.22–5.81)
RDW: 13.3 % (ref 11.5–15.5)
WBC: 4.1 K/uL (ref 4.0–10.5)
nRBC: 0 % (ref 0.0–0.2)

## 2023-11-11 LAB — BASIC METABOLIC PANEL WITH GFR
Anion gap: 8 (ref 5–15)
BUN: 24 mg/dL — ABNORMAL HIGH (ref 8–23)
CO2: 24 mmol/L (ref 22–32)
Calcium: 8.8 mg/dL — ABNORMAL LOW (ref 8.9–10.3)
Chloride: 109 mmol/L (ref 98–111)
Creatinine, Ser: 1.29 mg/dL — ABNORMAL HIGH (ref 0.61–1.24)
GFR, Estimated: 57 mL/min — ABNORMAL LOW (ref >60)
Glucose, Bld: 109 mg/dL — ABNORMAL HIGH (ref 70–99)
Potassium: 4 mmol/L (ref 3.5–5.1)
Sodium: 141 mmol/L (ref 135–145)

## 2023-11-11 MED ORDER — GABAPENTIN 100 MG PO CAPS
100.0000 mg | ORAL_CAPSULE | Freq: Every day | ORAL | Status: DC
  Administered 2023-11-11 – 2023-11-15 (×5): 100 mg via ORAL
  Filled 2023-11-11 (×5): qty 1

## 2023-11-11 NOTE — Progress Notes (Signed)
 PROGRESS NOTE   Subjective/Complaints:  No acute complaints.  No events overnight.  Does note chronically having to hang his legs off the bed at nighttime due to discomfort from peripheral vascular disease.  Patient's daughter at bedside, has significant questions regarding his anticoagulation, including qualification for the aspire trial, risks versus benefits of anticoagulation versus not continuing anticoagulation.  Discusses that he was on Eliquis  prior, which he tolerated well, but was financially infusible due to insurance coverage.  Had been switched to Coumadin , which resulted in his current IPH.  Would like to look into financial options for [name] prior to deciding whether they want to proceed with aspire trial or not.  ROS: Positives per HPI above. Denies fevers, chills, N/V, abdominal pain, SOB, chest pain, new weakness or paraesthesias.   - Left ankle pain--improved - Restlessness - Cognitive decline  Objective:   No results found. Recent Labs    11/11/23 0605  WBC 4.1  HGB 14.4  HCT 42.0  PLT 225   Recent Labs    11/11/23 0605  NA 141  K 4.0  CL 109  CO2 24  GLUCOSE 109*  BUN 24*  CREATININE 1.29*  CALCIUM  8.8*    Intake/Output Summary (Last 24 hours) at 11/11/2023 0858 Last data filed at 11/11/2023 0435 Gross per 24 hour  Intake 240 ml  Output 700 ml  Net -460 ml        Physical Exam: Vital Signs Blood pressure (!) 156/72, pulse (!) 57, temperature 97.9 F (36.6 C), resp. rate 18, height 5' 11 (1.803 m), weight 71.5 kg, SpO2 95%.   General: No acute distress.  Sitting up in bedside wheelchair. Mood and affect are appropriate Heart: Regular rate and rhythm no rubs murmurs or extra sounds Lungs: Clear to auscultation, breathing unlabored, no rales or wheezes Abdomen: Positive bowel sounds, soft nontender to palpation, nondistended Extremities: No clubbing, cyanosis, or edema Skin: No  evidence of breakdown, no evidence of rash Neurologic: Cranial nerves II through XII intact, motor strength is 5/5 in RIght and 4/5 left  deltoid, bicep, tricep, grip, hip flexor, knee extensors, ankle dorsiflexor and plantar flexor--ongoing + Motor restlessness with left upper and lower extremity--ongoing + Poor attention/memory--slightly improved   Mild left upper extremity ataxia  Musculoskeletal: + Left lateral ankle scar, limited range of motion in dorsiflexion, plantarflexion.  No apparent deformity, TTP along the lateral malleolus.--In brace today.  Physical exam unchanged from the above on reexamination 11/11/23    Assessment/Plan: 1. Functional deficits which require 3+ hours per day of interdisciplinary therapy in a comprehensive inpatient rehab setting. Physiatrist is providing close team supervision and 24 hour management of active medical problems listed below. Physiatrist and rehab team continue to assess barriers to discharge/monitor patient progress toward functional and medical goals  Care Tool:  Bathing    Body parts bathed by patient: Right arm, Left arm, Abdomen, Chest, Front perineal area, Buttocks, Right upper leg, Left upper leg, Right lower leg, Left lower leg, Face         Bathing assist Assist Level: Contact Guard/Touching assist     Upper Body Dressing/Undressing Upper body dressing   What is the patient wearing?: Pull  over shirt    Upper body assist Assist Level: Set up assist    Lower Body Dressing/Undressing Lower body dressing      What is the patient wearing?: Pants, Underwear/pull up     Lower body assist Assist for lower body dressing: Minimal Assistance - Patient > 75%     Toileting Toileting    Toileting assist Assist for toileting: Contact Guard/Touching assist     Transfers Chair/bed transfer  Transfers assist     Chair/bed transfer assist level: Minimal Assistance - Patient > 75%      Locomotion Ambulation   Ambulation assist      Assist level: Moderate Assistance - Patient 50 - 74% Assistive device: No Device Max distance: 120   Walk 10 feet activity   Assist     Assist level: Moderate Assistance - Patient - 50 - 74% Assistive device: No Device   Walk 50 feet activity   Assist    Assist level: Moderate Assistance - Patient - 50 - 74% Assistive device: No Device    Walk 150 feet activity   Assist    Assist level: Minimal Assistance - Patient > 75% Assistive device: No Device    Walk 10 feet on uneven surface  activity   Assist     Assist level: Moderate Assistance - Patient - 50 - 74% Assistive device: Other (comment) (no device.)   Wheelchair     Assist Is the patient using a wheelchair?: Yes (per PT evaluation note) Type of Wheelchair: Manual    Wheelchair assist level: Supervision/Verbal cueing Max wheelchair distance: 200    Wheelchair 50 feet with 2 turns activity    Assist        Assist Level: Supervision/Verbal cueing   Wheelchair 150 feet activity     Assist      Assist Level: Supervision/Verbal cueing   Blood pressure (!) 156/72, pulse (!) 57, temperature 97.9 F (36.6 C), resp. rate 18, height 5' 11 (1.803 m), weight 71.5 kg, SpO2 95%.  Medical Problem List and Plan: 1. Functional deficits secondary to right basal ganglia IPH Poor motor control left side alien arm and leg , no sensory loss             -patient may  shower             -ELOS/Goals: 10 to 12 days, supervision PT, min assist OT, supervision SLP - 10/21             - stable to continue inpatient rehab   - 10/14: SPV for UB, Min A LB, no awareness of deficits and poor activity tolerance. Attention deficits. CGA transfers and ambulation up to 175 with RW. L sided inattention limiting. At cognitive baseline--per wife has been managing his ADLs since 2023 and he does not drive.    2.  Antithrombotics: -DVT/anticoagulation:   Pharmaceutical: Lovenox --plan to start aspire trial on discharge, will reach out to Dr. Rosemarie  10-20             -antiplatelet therapy: N/A at this time.   3. Pain Management: Tylenol  prn.   -10-13: Chronic left lateral ankle pain.  Add Voltaren gel 4 times daily--improved  - 10-16: Trial gabapentin 100 mg nightly for restless legs  4. Mood/Behavior/Sleep: LCSW to follow for evaluation and support.              -antipsychotic agents: N/A   5. Neuropsych/cognition: This patient is not capable of making decisions on his own behalf.             -  10-10: Restlessness without agitation.  No unsafe impulsive behaviors seen   - 10/14: Cog deficits progressive since 2023; start donepezil 5 mg daily after discussing with patient and his wife--seems slightly improved 10-15   6. Skin/Wound Care: Routine pressure relief measures.  7. Fluids/Electrolytes/Nutrition: Monitor I/O. Check CMET in am            8. PAF: Monitor HR TID--off coumadin  due to ICH             --family to decide on Aspire trial (Eliquis  v/s ASA)  - Patient was prior on Eliquis  but could not afford, was switched to Coumadin , resulting in bleed   - 10-16: Family with considerable concerns regarding inspire trial, versus retrial of Eliquis  versus alternative like Xarelto or Pradaxa.  Discussing Eliquis  prior authorization with pharmacy and formulary alternatives so family can discuss options prior to discussion with Dr. Rosemarie 10-20  9. T2DM: HGb A1C- 6.4 and well controlled on diet alone.          CBG (last 3)  Recent Labs    11/08/23 1700 11/08/23 2023 11/09/23 0628  GLUCAP 97 106* 103*  10-13 blood sugars well-controlled on current regimen; DC CBGs  10. HTN: Monitor BP TID. SBP goal , 160.   - 10-15: Increasing hypertension, heart rate low.  Resume amlodipine  10 mg daily.  Continue hold losartan  50 mg daily in setting of AKI.  10-16: Slightly improved with amlodipine , monitor 1 to 2 days then if no large drop resume losartan   25 mg daily Vitals:   11/10/23 2312 11/11/23 0436  BP: (!) 154/65 (!) 156/72  Pulse: (!) 51 (!) 57  Resp:  18  Temp:  97.9 F (36.6 C)  SpO2:  95%    11. Mild cognitive impairment: Last seen by Neuro 11/22- per family does not have appeared to worsen with ICH  12. Elevated Lipids: Trig-272. VLDL-54. ON              --On Crestor  40 mg, add lovaza- will need repeat trig level in 1 mo   13.  CKD.  Baseline outpatient creatinine 1.3-1.5.   - 10-16: Creatinine downtrending/improved.  Has remained between 1.2 and 1.3 throughout hospitalization.     LOS: 6 days A FACE TO FACE EVALUATION WAS PERFORMED  John Woodward 11/11/2023, 8:58 AM

## 2023-11-11 NOTE — Progress Notes (Signed)
 Physical Therapy Session Note  Patient Details  Name: John Woodward. MRN: [id number] Date of Birth: 06-07-1945  Today's Date: 11/11/2023 PT Individual Time: 1100-1200 + 1515-1610  PT Individual Time Calculation (min): 60 min  + 55 min  Short Term Goals: Week 1:  PT Short Term Goal 1 (Week 1): STGs = LTGs  Skilled Therapeutic Interventions/Progress Updates:      1st session: Pt sitting up in wheelchair with family present.   Focused session on family education and training with his daughter and granddaughter. Reviewed PT POC, PT goals, primary deficits related to CVA, home safety, follow up therapy recommendations, fall precautions, etc. Daughter and granddaughter receptive to all feedback and education. Daughter is a Char[employer] fundraiser who does well with cueing patient on safety and slowing down.   Pt completed sit<>stands with supervision using RW with cues for hand placement. Ambulates with supervision assist and RW rehab hallway distances > 172ft. Pt with foot slap and heel whip on L side, athetoid movement with difficulty controlling. No knee buckling or LOB - is quick to turn with distractions or when trying to locate family who were behind him. Discussed need to stop before turning and being more mindful of his falls risk.   Reviewed stair training with 6 steps and 1 hand rail to simulate home entrance. Pt able to complete x12 steps with CGA with 1 railing, able to complete at supervision level with 2 railings. Family reports plan to install a 2nd hand rail at home.   Reviewed car transfers with car height simulating their [employer]. Pt able to complete car transfer at supervision level while using the RW. Needed safety cues for approach and for sitting bottom in first before getting legs into vehicle.   Finished session on Nustep at L5 resistance with BUE/BLE to target whole body strengthening and cardiovascular endurance training. Pt needing x3 rest breaks to finish the x10 minutes.  Reviewed energy conservation, awareness of fatigue levels and how they impact safety, etc.   Pt returned to his room with directional cues for locating. Left sitting up in the wheelchair with family present. All questions addressed. Family asking for a grounds pass - relayed to MD.      2nd session: Pt sitting up in wheelchair, granddaughter present for observation. Pt has no reports of pain.   Sit<>stand with no AD with CGA - pt quick to stand, difficulty for him to grade muscle control at times.   Ambulated with no AD for dynamic gait training in rehab hallways, >132ft, minA for balance. Cues for keeping L foot toes pointed forwards to reduce internal rotation at the hip and reduce tripping hazzard. Pt quick to turn his body with distractions and will lose his balance, minA for balance recovery.   Assisted patient on the Treadmill with no harness or body weight support. On treadmill, worked on endurance and stepping. Speed set to 0.9 mph and patient had BUE support on hand rails of treadmill. He could tolerate up to 3 minutes of ambulating before needing a rest break. Adjusted incline/decline on the treadmill to challenge patient. Seated rest breaks b/w bouts.   Pt returned to his room and was left sitting in w/c, needs met. Seat belt alarm on.       Therapy Documentation Precautions:  Precautions Precautions: Fall Recall of Precautions/Restrictions: Impaired Restrictions Weight Bearing Restrictions Per Provider Order: No General:      Therapy/Group: Individual Therapy  Jeanean Hollett P Triston Skare 11/11/2023, 7:50 AM

## 2023-11-11 NOTE — Progress Notes (Deleted)
Patient refusing lab work this morning.

## 2023-11-11 NOTE — Progress Notes (Signed)
 Occupational Therapy Session Note  Patient Details  Name: John Woodward. MRN: [id number] Date of Birth: 11-28-1945  Today's Date: 11/11/2023 OT Individual Time: 0905-1000 OT Individual Time Calculation (min): 55 min   Today's Date: 11/11/2023 OT Individual Time: 8694-8654 OT Individual Time Calculation (min): 40 min   Short Term Goals: Week 1:  OT Short Term Goal 1 (Week 1): Patient will complete 3/3 toileting tasks with CGA OT Short Term Goal 2 (Week 1): Patient will demonstrate improved safety awareness by locking wc breaks prior to stand with min cues OT Short Term Goal 3 (Week 1): Patient will complete LB dressing with CGA.  Skilled Therapeutic Interventions/Progress Updates:   Session 1: Pt greeted resting in bed for skilled OT session with focus on caregiver education.   Pain: Pt with no reports of pain. ASO donned to L-ankle for stability/pain management. OT offering intermediate rest breaks and positioning suggestions throughout session to address potential pain/fatigue and maximize participation/safety in session.   Functional Transfers/Self-care: Pt's spouse present for caregiver education with focus on OT role, OT POC, and current patient functioning. Bed mobility, functional transfers (walk-in shower and household-level ambulation), and DME recommendations reviewed. Emphasis placed on patient's need for cuing to decrease impulsivity/fast pace with transitional movements as well as management of L-sided inattention. Pt's caregivers compete hands-on training for functional transfers, all questions answered at end of session. Handout provided on hand-held shower head and non-skid shower strips.   Pt remained sitting in WC with 4Ps assessed and immediate needs met. Pt continues to be appropriate for skilled OT intervention to promote further functional independence in ADLs/IADLs.   Session 2: Pt greeted sitting in [facility] for skilled OT session with focus on functional mobility,  dynamic standing balance, and BUE strengthening.   Pain: Pt with intermediate reports of pain in L-ankle, ASO still donned. OT offering intermediate rest breaks and positioning suggestions throughout session to address pain/fatigue and maximize participation/safety in session.   Functional Transfers: Sit<>stands with CGA + no AD.   Self Care Tasks: No needs this session.   Therapeutic Activities: Pt instructed in series of dynamic balance activities targeting dual-tasking and reaction-times, details below: Ambulation without AD tossing a ball into air Ambulation without AD using tidal wave Ambulation without AD stopping abruptly as cued by therapist Pt requires CGA-Min A (for truncal support) throughout the above with cuing for upright posture. X1 standing rest break during activity with use of tidal wave.   Therapeutic Exercise: Pt completes x6 mins of SciFit modality in the intervals below for BUE strengthening/endurance required during ADLs/transfers.  X2 mins bilaterally X1 min LUE forward & backward X1 min RUE forward & backward  Pt performs at level 1-2 resistance with cuing for upright posture and safe pacing.   Pt remained sitting in WC, posey belt activate, 4Ps assessed and immediate needs met. Pt continues to be appropriate for skilled OT intervention to promote further functional independence in ADLs/IADLs.   Therapy Documentation Precautions:  Precautions Precautions: Fall Recall of Precautions/Restrictions: Impaired Restrictions Weight Bearing Restrictions Per Provider Order: No   Therapy/Group: Individual Therapy  Nereida Habermann, OTR/L, MSOT  11/11/2023, 7:53 AM

## 2023-11-12 ENCOUNTER — Other Ambulatory Visit (HOSPITAL_COMMUNITY): Payer: Self-pay

## 2023-11-12 MED ORDER — LOSARTAN POTASSIUM 25 MG PO TABS
25.0000 mg | ORAL_TABLET | Freq: Every day | ORAL | Status: DC
  Administered 2023-11-12 – 2023-11-14 (×3): 25 mg via ORAL
  Filled 2023-11-12 (×3): qty 1

## 2023-11-12 NOTE — Plan of Care (Signed)
  Problem: Consults Goal: RH STROKE PATIENT EDUCATION Description: See Patient Education module for education specifics  Outcome: Progressing   Problem: RH BOWEL ELIMINATION Goal: RH STG MANAGE BOWEL WITH ASSISTANCE Description: STG Manage Bowel with supervision-min Assistance. Outcome: Progressing   Problem: RH BLADDER ELIMINATION Goal: RH STG MANAGE BLADDER WITH ASSISTANCE Description: STG Manage Bladder With supervision-min Assistance Outcome: Progressing   Problem: RH SKIN INTEGRITY Goal: RH STG SKIN FREE OF INFECTION/BREAKDOWN Description: Manage skin free of infection/breakdown with supervision to min assistance Outcome: Progressing   Problem: RH SAFETY Goal: RH STG ADHERE TO SAFETY PRECAUTIONS W/ASSISTANCE/DEVICE Description: STG Adhere to Safety Precautions With Assistance/Device. Outcome: Progressing   Problem: RH PAIN MANAGEMENT Goal: RH STG PAIN MANAGED AT OR BELOW PT'S PAIN GOAL Description: <4 w/ prns Outcome: Progressing   Problem: RH KNOWLEDGE DEFICIT Goal: RH STG INCREASE KNOWLEDGE OF DIABETES Description: Manage increase knowledge of diabetes with supervision-min assistance from wife using educational materials provided Outcome: Progressing Goal: RH STG INCREASE KNOWLEDGE OF HYPERTENSION Description: Manage increase knowledge of hypertension with supervision-min assistance from wife using educational materials provided Outcome: Progressing Goal: RH STG INCREASE KNOWLEGDE OF HYPERLIPIDEMIA Description: Manage increase knowledge of hyperlipidemia  with supervision-min assistance from wife using educational materials provided Outcome: Progressing

## 2023-11-12 NOTE — Progress Notes (Signed)
 Occupational Therapy Session Note  Patient Details  Name: John Woodward. MRN: [id number] Date of Birth: 01-18-1946  Today's Date: 11/12/2023 OT Individual Time: 1002-1100 OT Individual Time Calculation (min): 58 min   Today's Date: 11/12/2023 OT Individual Time: 8694-8654 OT Individual Time Calculation (min): 40 min   Short Term Goals: Week 1:  OT Short Term Goal 1 (Week 1): Patient will complete 3/3 toileting tasks with CGA OT Short Term Goal 2 (Week 1): Patient will demonstrate improved safety awareness by locking wc breaks prior to stand with min cues OT Short Term Goal 3 (Week 1): Patient will complete LB dressing with CGA.  Skilled Therapeutic Interventions/Progress Updates:   Session 1: Pt greeted from direct PT handoff for skilled OT session with focus on [facility], dynamic standing balance, and functional mobility.   Pain: Pt with intermediate reports of pain at L-ankle. OT offering intermediate rest breaks and positioning suggestions throughout session to address pain/fatigue and maximize participation/safety in session.   Functional Transfers: Sit<>stands with CGA + no AD. Mobility from day room>room with CGA-Min A (with distraction/turns) + no AD.   Self Care Tasks: No needs voiced this session.   Therapeutic Activities: Pt instructed in series of FMC/dexterity activities, including game of Jenga, with use of LUE (2# weight donned for proprioceptive input). Pt requires cuing to avoid compensation with use of table edge to stabilize L hand.   Activity then upgraded with inclusion of dynamic standing balance by having patient step multi-directionally to place small pegs into color coordinated containers. CGA provided with no UE support, including when reaching below waist-level.   Pt remained sitting in WC with 4Ps assessed and immediate needs met. Pt continues to be appropriate for skilled OT intervention to promote further functional independence in ADLs/IADLs.     Session 2: Pt greeted resting in bed for skilled OT session with focus on BUE conditioning/strengthening.   Pain: Pt OT offering intermediate rest breaks and positioning suggestions throughout session to address pain/fatigue and maximize participation/safety in session.   Functional Transfers: Stand-pivots with CGA + no AD.   Self Care Tasks: Increased time dedicated to educating on donning of L ASO.   Therapeutic Exercise: Pt instructed in the BUE exerices below for carryover into ADL participation/functional transfers, details below: Chest press Overhead press Bicep curls Pronation/supination  Pt completes 2x10 reps of the exercises above with multimodal cuing for correct musculature activation/form. 5# tolerated with cuing for safe pacing.   Pt remained resting in bed with 4Ps assessed and immediate needs met. Pt continues to be appropriate for skilled OT intervention to promote further functional independence in ADLs/IADLs.   Therapy Documentation Precautions:  Precautions Precautions: Fall Recall of Precautions/Restrictions: Impaired Restrictions Weight Bearing Restrictions Per Provider Order: No   Therapy/Group: Individual Therapy  Nereida Habermann, OTR/L, MSOT  11/12/2023, 7:55 AM

## 2023-11-12 NOTE — Progress Notes (Signed)
 Patient ID: John LOISE Francia Mickey., male   DOB: [birth year], 78 y.o.   MRN: [id number]   1049-SW spoke with pt wife John Woodward  to inform on DME recs. SW will place order.   SW ordered RW and 3in1BSC with [employer] via parachute.  Graeme Jude, MSW, LCSW Office: [phone] Cell: [phone] Fax: [phone]

## 2023-11-12 NOTE — Progress Notes (Signed)
 PROGRESS NOTE   Subjective/Complaints:  No acute complaints.  No events overnight.  Feels left lower extremity restlessness is severe today, but not significantly changed from prior days.  No discomfort.  States pain in bilateral lower extremities was much better last night than it has been prior. BP elevated 160-140s; agreeable to medication increase today   ROS: Positives per HPI above. Denies fevers, chills, N/V, abdominal pain, SOB, chest pain, new weakness or paraesthesias.   - Left ankle pain--improved - Restlessness - Cognitive decline  Objective:   No results found. Recent Labs    11/11/23 0605  WBC 4.1  HGB 14.4  HCT 42.0  PLT 225   Recent Labs    11/11/23 0605  NA 141  K 4.0  CL 109  CO2 24  GLUCOSE 109*  BUN 24*  CREATININE 1.29*  CALCIUM  8.8*    Intake/Output Summary (Last 24 hours) at 11/12/2023 0944 Last data filed at 11/12/2023 0750 Gross per 24 hour  Intake 591 ml  Output 700 ml  Net -109 ml        Physical Exam: Vital Signs Blood pressure (!) 142/74, pulse 70, temperature 97.8 F (36.6 C), resp. rate 19, height 5' 11 (1.803 m), weight 71.5 kg, SpO2 94%.   General: No acute distress.  Sitting up in therapy gym. Mood and affect are appropriate Heart: Regular rate and rhythm no rubs murmurs or extra sounds Lungs: Clear to auscultation, breathing unlabored, no rales or wheezes Abdomen: Positive bowel sounds, soft nontender to palpation, nondistended Extremities: No clubbing, cyanosis, or edema Skin: No evidence of breakdown, no evidence of rash Neurologic: Cranial nerves II through XII intact, motor strength is 5/5 in RIght and 4/5 left  deltoid, bicep, tricep, grip, hip flexor, knee extensors, ankle dorsiflexor and plantar flexor--ongoing + Motor restlessness with left upper and lower extremity--ongoing + Poor attention/memory--slightly improved   Mild left upper extremity  ataxia  Musculoskeletal: + Left lateral ankle scar, limited range of motion in dorsiflexion, plantarflexion.  No apparent deformity, no TTP currently  Physical exam unchanged from the above on reexamination 11/12/23    Assessment/Plan: 1. Functional deficits which require 3+ hours per day of interdisciplinary therapy in a comprehensive inpatient rehab setting. Physiatrist is providing close team supervision and 24 hour management of active medical problems listed below. Physiatrist and rehab team continue to assess barriers to discharge/monitor patient progress toward functional and medical goals  Care Tool:  Bathing    Body parts bathed by patient: Right arm, Left arm, Abdomen, Chest, Front perineal area, Buttocks, Right upper leg, Left upper leg, Right lower leg, Left lower leg, Face         Bathing assist Assist Level: Contact Guard/Touching assist     Upper Body Dressing/Undressing Upper body dressing   What is the patient wearing?: Pull over shirt    Upper body assist Assist Level: Set up assist    Lower Body Dressing/Undressing Lower body dressing      What is the patient wearing?: Pants, Underwear/pull up     Lower body assist Assist for lower body dressing: Minimal Assistance - Patient > 75%     Toileting Toileting  Toileting assist Assist for toileting: Contact Guard/Touching assist     Transfers Chair/bed transfer  Transfers assist     Chair/bed transfer assist level: Minimal Assistance - Patient > 75%     Locomotion Ambulation   Ambulation assist      Assist level: Moderate Assistance - Patient 50 - 74% Assistive device: No Device Max distance: 120   Walk 10 feet activity   Assist     Assist level: Moderate Assistance - Patient - 50 - 74% Assistive device: No Device   Walk 50 feet activity   Assist    Assist level: Moderate Assistance - Patient - 50 - 74% Assistive device: No Device    Walk 150 feet activity   Assist     Assist level: Minimal Assistance - Patient > 75% Assistive device: No Device    Walk 10 feet on uneven surface  activity   Assist     Assist level: Moderate Assistance - Patient - 50 - 74% Assistive device: Other (comment) (no device.)   Wheelchair     Assist Is the patient using a wheelchair?: Yes (per PT evaluation note) Type of Wheelchair: Manual    Wheelchair assist level: Supervision/Verbal cueing Max wheelchair distance: 200    Wheelchair 50 feet with 2 turns activity    Assist        Assist Level: Supervision/Verbal cueing   Wheelchair 150 feet activity     Assist      Assist Level: Supervision/Verbal cueing   Blood pressure (!) 142/74, pulse 70, temperature 97.8 F (36.6 C), resp. rate 19, height 5' 11 (1.803 m), weight 71.5 kg, SpO2 94%.  Medical Problem List and Plan: 1. Functional deficits secondary to right basal ganglia IPH Poor motor control left side alien arm and leg , no sensory loss             -patient may  shower             -ELOS/Goals: 10 to 12 days, supervision PT, min assist OT, supervision SLP - 10/21             - stable to continue inpatient rehab   - 10/14: SPV for UB, Min A LB, no awareness of deficits and poor activity tolerance. Attention deficits. CGA transfers and ambulation up to 175 with RW. L sided inattention limiting. At cognitive baseline--per wife has been managing his ADLs since 2023 and he does not drive.    2.  Antithrombotics: -DVT/anticoagulation:  Pharmaceutical: Lovenox --plan to start aspire trial on discharge, will reach out to Dr. Rosemarie  10-20             -antiplatelet therapy: N/A at this time.   3. Pain Management: Tylenol  prn.   -10-13: Chronic left lateral ankle pain.  Add Voltaren gel 4 times daily--improved  - 10-16: Trial gabapentin 100 mg nightly for restless legs--significantly improved  4. Mood/Behavior/Sleep: LCSW to follow for evaluation and support.              -antipsychotic  agents: N/A   5. Neuropsych/cognition: This patient is not capable of making decisions on his own behalf.             - 10-10: Restlessness without agitation.  No unsafe impulsive behaviors seen   - 10/14: Cog deficits progressive since 2023; start donepezil 5 mg daily after discussing with patient and his wife--seems slightly improved 10-15   6. Skin/Wound Care: Routine pressure relief measures.  7. Fluids/Electrolytes/Nutrition: Monitor I/O. Check  CMET in am            8. PAF: Monitor HR TID--off coumadin  due to ICH             --family to decide on Aspire trial (Eliquis  v/s ASA)  - Patient was prior on Eliquis  but could not afford, was switched to Coumadin , resulting in bleed   - 10-16: Family with considerable concerns regarding inspire trial, versus retrial of Eliquis  versus alternative like Xarelto or Pradaxa.  Discussing Eliquis  prior authorization with pharmacy and formulary alternatives so family can discuss options prior to discussion with Dr. Rosemarie 10-20--Pharmacy continuing working on cost estimate 10-17  9. T2DM: HGb A1C- 6.4 and well controlled on diet alone.          CBG (last 3)  10-13 blood sugars well-controlled on current regimen; DC CBGs  10. HTN: Monitor BP TID. SBP goal , 160.   - 10-15: Increasing hypertension, heart rate low.  Resume amlodipine  10 mg daily.  Continue hold losartan  50 mg daily in setting of AKI.  10-16: Slightly improved with amlodipine , monitor 1 to 2 days then if no large drop resume losartan  25 mg daily  10-17: Resume losartan  at 25 mg daily; watch 1-2 days then can increase to home 50 mg daily if approrpiate Vitals:   11/11/23 2018 11/12/23 0456  BP: (!) 161/74 (!) 142/74  Pulse: (!) 54 70  Resp: 18 19  Temp: (!) 97.5 F (36.4 C) 97.8 F (36.6 C)  SpO2: 97% 94%    11. Mild cognitive impairment: Last seen by Neuro 11/22- per family does not have appeared to worsen with ICH  12. Elevated Lipids: Trig-272. VLDL-54. ON              --On  Crestor  40 mg, add lovaza- will need repeat trig level in 1 mo   13.  CKD.  Baseline outpatient creatinine 1.3-1.5.   - 10-16: Creatinine downtrending/improved.  Has remained between 1.2 and 1.3 throughout hospitalization.    14. Constipation.   - LBM 10/15. Monitor.  LOS: 7 days A FACE TO FACE EVALUATION WAS PERFORMED  John Woodward 11/12/2023, 9:44 AM

## 2023-11-12 NOTE — Progress Notes (Signed)
 Physical Therapy Session Note  Patient Details  Name: John Woodward. MRN: [id number] Date of Birth: [birth year]  Today's Date: 11/12/2023 PT Individual Time: 0904-0959 PT Individual Time Calculation (min): 55 min   Today's Date: 11/12/2023 PT Individual Time: 1502-1530 PT Individual Time Calculation (min): 28 min   Short Term Goals: Week 1:  PT Short Term Goal 1 (Week 1): STGs = LTGs  Skilled Therapeutic Interventions/Progress Updates:     1st Session: Pt received supine in bed asleep. Easily awakens to verbal stimuli. PT provides pt with pants and pt performs bridges to assist with pulling pants over hips. Pt performs supine to sit with cues for positioning. Pt then ambulates to toilet with light minA and cues for posture and positioning. Pt stands to urinate and then ambulates to sink to wash hands and brush teeth, working on balance and coordination. Pt then ambulates x100' to gym with minA at trunk and cues for upright gaze t improve posture and balance, and attending to Lt side, which is ataxic LUE>LLE. Pt takes seated rest break. Pt then performs standing activity for NMR, challenging Lt hemibody coordination and attention, as well as balance. Pt removes squigz from mirror and then places in box on Lt side. PT provides minA or trunk stability and pt completes in time of 1:05. Following rest break, pt completes with RUE and finishes in 0:47. Following rest break, pt completes same activity with LUE and improves time to 0:59. Pt also reaches down to pick up squig from ground with CGA/minA for stability. Pt then tasked with sanitizing squigz with Rt and Lt hands to work on coordination and attention. Pt handed off to OT, seated on mat in dayroom.   2nd Session: Pt received supine in bed and agrees to therapy. No complaint of pain. Supine to sit with cues for positioning at EOB. Pt dons shoes with setup assistance and increased time required. Pt stands with CGA and cues for initiation. PT  provides tactile cueing at trunk for stability, then pt ambulates x125' with light minA, with cues for reciprocal gait pattern, limiting internal rotation of LLE during swing phase, and bringing attention to Lt hemibody. Pt transitions to Nustep with cues for positioning. Pt completes Nustep for reciprocal coordination training and endurance training. Pt completes total of x10:00 of active time with workload at 5 and average steps per minute ~5. Pt requires multiple rest breaks during activity. PT provides cues for hand and foot placement and completing full available ROM. Pt ambulates back to room with minA at Lt side, and cues to attend to LLE. Pt left supine in bed with all needs within reach.   Therapy Documentation Precautions:  Precautions Precautions: Fall Recall of Precautions/Restrictions: Impaired Restrictions Weight Bearing Restrictions Per Provider Order: No   Therapy/Group: Individual Therapy  Elsie JAYSON Dawn, PT, DPT 11/12/2023, 3:51 PM

## 2023-11-13 DIAGNOSIS — I10 Essential (primary) hypertension: Secondary | ICD-10-CM

## 2023-11-13 DIAGNOSIS — G479 Sleep disorder, unspecified: Secondary | ICD-10-CM

## 2023-11-13 NOTE — Progress Notes (Signed)
 PROGRESS NOTE   Subjective/Complaints:  Pt resting when I came in. Says he's not sleeping that great but that he doesn't want to take anything to help him sleep better. He last took trazodone 2 nights ago.  ROS: Patient denies fever, rash, sore throat, blurred vision, dizziness, nausea, vomiting, diarrhea, cough, shortness of breath or chest pain, joint or back/neck pain, headache, or mood change.   Objective:   No results found. Recent Labs    11/11/23 0605  WBC 4.1  HGB 14.4  HCT 42.0  PLT 225   Recent Labs    11/11/23 0605  NA 141  K 4.0  CL 109  CO2 24  GLUCOSE 109*  BUN 24*  CREATININE 1.29*  CALCIUM  8.8*    Intake/Output Summary (Last 24 hours) at 11/13/2023 1155 Last data filed at 11/13/2023 0736 Gross per 24 hour  Intake 460 ml  Output 1225 ml  Net -765 ml        Physical Exam: Vital Signs Blood pressure (!) 143/75, pulse (!) 56, temperature 97.6 F (36.4 C), temperature source Oral, resp. rate 16, height 5' 11 (1.803 m), weight 68.2 kg, SpO2 93%.   Constitutional: No distress . Vital signs reviewed. HEENT: NCAT, EOMI, oral membranes moist Neck: supple Cardiovascular: RRR without murmur. No JVD    Respiratory/Chest: CTA Bilaterally without wheezes or rales. Normal effort    GI/Abdomen: BS +, non-tender, non-distended Ext: no clubbing, cyanosis, or edema Psych: pleasant and cooperative  Skin: No evidence of breakdown, no evidence of rash Neurologic: Cranial nerves II through XII intact, motor strength is 5/5 in RIght and 4/5 left  deltoid, bicep, tricep, grip, hip flexor, knee extensors, ankle dorsiflexor and plantar flexor--ongoing + Motor restlessness with left upper and lower extremity--somewhat improved + Poor attention/memory--seemed more appropriate to me   Mild left upper extremity ataxia  Musculoskeletal: + Left lateral ankle scar, limited range of motion in dorsiflexion,  plantarflexion.  No apparent deformity, no TTP currently  Physical exam unchanged from the above on reexamination 11/13/23    Assessment/Plan: 1. Functional deficits which require 3+ hours per day of interdisciplinary therapy in a comprehensive inpatient rehab setting. Physiatrist is providing close team supervision and 24 hour management of active medical problems listed below. Physiatrist and rehab team continue to assess barriers to discharge/monitor patient progress toward functional and medical goals  Care Tool:  Bathing    Body parts bathed by patient: Right arm, Left arm, Abdomen, Chest, Front perineal area, Buttocks, Right upper leg, Left upper leg, Right lower leg, Left lower leg, Face         Bathing assist Assist Level: Contact Guard/Touching assist     Upper Body Dressing/Undressing Upper body dressing   What is the patient wearing?: Pull over shirt    Upper body assist Assist Level: Set up assist    Lower Body Dressing/Undressing Lower body dressing      What is the patient wearing?: Pants, Underwear/pull up     Lower body assist Assist for lower body dressing: Minimal Assistance - Patient > 75%     Toileting Toileting    Toileting assist Assist for toileting: Contact Guard/Touching assist  Transfers Chair/bed transfer  Transfers assist     Chair/bed transfer assist level: Minimal Assistance - Patient > 75%     Locomotion Ambulation   Ambulation assist      Assist level: Moderate Assistance - Patient 50 - 74% Assistive device: No Device Max distance: 120   Walk 10 feet activity   Assist     Assist level: Moderate Assistance - Patient - 50 - 74% Assistive device: No Device   Walk 50 feet activity   Assist    Assist level: Moderate Assistance - Patient - 50 - 74% Assistive device: No Device    Walk 150 feet activity   Assist    Assist level: Minimal Assistance - Patient > 75% Assistive device: No Device    Walk  10 feet on uneven surface  activity   Assist     Assist level: Moderate Assistance - Patient - 50 - 74% Assistive device: Other (comment) (no device.)   Wheelchair     Assist Is the patient using a wheelchair?: Yes (per PT evaluation note) Type of Wheelchair: Manual    Wheelchair assist level: Supervision/Verbal cueing Max wheelchair distance: 200    Wheelchair 50 feet with 2 turns activity    Assist        Assist Level: Supervision/Verbal cueing   Wheelchair 150 feet activity     Assist      Assist Level: Supervision/Verbal cueing   Blood pressure (!) 143/75, pulse (!) 56, temperature 97.6 F (36.4 C), temperature source Oral, resp. rate 16, height 5' 11 (1.803 m), weight 68.2 kg, SpO2 93%.  Medical Problem List and Plan: 1. Functional deficits secondary to right basal ganglia IPH Poor motor control left side alien arm and leg , no sensory loss             -patient may  shower             -ELOS/Goals: 10 to 12 days, supervision PT, min assist OT, supervision SLP - 10/21             - stable to continue inpatient rehab   - 10/14: SPV for UB, Min A LB, no awareness of deficits and poor activity tolerance. Attention deficits. CGA transfers and ambulation up to 175 with RW. L sided inattention limiting. At cognitive baseline--per wife has been managing his ADLs since 2023 and he does not drive.      -Continue CIR therapies including PT, OT, and SLP  2.  Antithrombotics: -DVT/anticoagulation:  Pharmaceutical: Lovenox --plan to start aspire trial on discharge, will reach out to Dr. Rosemarie  10-20             -antiplatelet therapy: N/A at this time.   3. Pain Management: Tylenol  prn.   -10-13: Chronic left lateral ankle pain.  Add Voltaren gel 4 times daily--improved  - 10-16: Trial gabapentin 100 mg nightly for restless legs--significantly improved  4. Mood/Behavior/Sleep: LCSW to follow for evaluation and support.              -antipsychotic agents: N/A    5. Neuropsych/cognition: This patient is not capable of making decisions on his own behalf.             - 10-10: Restlessness without agitation.  No unsafe impulsive behaviors seen   - 10/14: Cog deficits progressive since 2023; start donepezil 5 mg daily after discussing with patient and his wife--seems slightly improved 10-15   6. Skin/Wound Care: Routine pressure relief measures.  7. Fluids/Electrolytes/Nutrition:  Monitor I/O. Check CMET in am            8. PAF: Monitor HR TID--off coumadin  due to ICH             --family to decide on Aspire trial (Eliquis  v/s ASA)  - Patient was prior on Eliquis  but could not afford, was switched to Coumadin , resulting in bleed   - 10-16: Family with considerable concerns regarding inspire trial, versus retrial of Eliquis  versus alternative like Xarelto or Pradaxa.  Discussing Eliquis  prior authorization with pharmacy and formulary alternatives so family can discuss options prior to discussion with Dr. Rosemarie 10-20--Pharmacy continuing working on cost estimate 10-17  9. T2DM: HGb A1C- 6.4 and well controlled on diet alone.          CBG (last 3)  10-13 blood sugars well-controlled on current regimen; DC CBGs  10. HTN: Monitor BP TID. SBP goal , 160.   - 10-15: Increasing hypertension, heart rate low.  Resume amlodipine  10 mg daily.  Continue hold losartan  50 mg daily in setting of AKI.  10-16: Slightly improved with amlodipine , monitor 1 to 2 days then if no large drop resume losartan  25 mg daily  10-17: Resume losartan  at 25 mg daily; watch 1-2 days then can increase to home 50 mg daily if appropriate  10/18 BP ok this morning--obsv today Vitals:   11/12/23 2111 11/13/23 0439  BP: (!) 160/68 (!) 143/75  Pulse: (!) 59 (!) 56  Resp: 18 16  Temp: 98 F (36.7 C) 97.6 F (36.4 C)  SpO2: 98% 93%    11. Mild cognitive impairment: Last seen by Neuro 11/22- per family does not have appeared to worsen with ICH  12. Elevated Lipids: Trig-272. VLDL-54. ON               --On Crestor  40 mg, add lovaza- will need repeat trig level in 1 mo   13.  CKD.  Baseline outpatient creatinine 1.3-1.5.   - 10-16: Creatinine downtrending/improved.  Has remained between 1.2 and 1.3 throughout hospitalization.    14. Constipation.   - LBM 10/15. Monitor.  LOS: 8 days A FACE TO FACE EVALUATION WAS PERFORMED  Arthea ONEIDA Gunther 11/13/2023, 11:55 AM

## 2023-11-13 NOTE — Plan of Care (Signed)
  Problem: RH BLADDER ELIMINATION Goal: RH STG MANAGE BLADDER WITH ASSISTANCE Description: STG Manage Bladder With supervision-min Assistance Outcome: Progressing   Problem: RH SKIN INTEGRITY Goal: RH STG SKIN FREE OF INFECTION/BREAKDOWN Description: Manage skin free of infection/breakdown with supervision to min assistance Outcome: Progressing   Problem: RH SAFETY Goal: RH STG ADHERE TO SAFETY PRECAUTIONS W/ASSISTANCE/DEVICE Description: STG Adhere to Safety Precautions With Assistance/Device. Outcome: Progressing

## 2023-11-13 NOTE — Plan of Care (Signed)
  Problem: Consults Goal: RH STROKE PATIENT EDUCATION Description: See Patient Education module for education specifics  Outcome: Progressing   Problem: RH BOWEL ELIMINATION Goal: RH STG MANAGE BOWEL WITH ASSISTANCE Description: STG Manage Bowel with supervision-min Assistance. Outcome: Progressing   Problem: RH BLADDER ELIMINATION Goal: RH STG MANAGE BLADDER WITH ASSISTANCE Description: STG Manage Bladder With supervision-min Assistance Outcome: Progressing   Problem: RH SKIN INTEGRITY Goal: RH STG SKIN FREE OF INFECTION/BREAKDOWN Description: Manage skin free of infection/breakdown with supervision to min assistance Outcome: Progressing   Problem: RH SAFETY Goal: RH STG ADHERE TO SAFETY PRECAUTIONS W/ASSISTANCE/DEVICE Description: STG Adhere to Safety Precautions With Assistance/Device. Outcome: Progressing   Problem: RH PAIN MANAGEMENT Goal: RH STG PAIN MANAGED AT OR BELOW PT'S PAIN GOAL Description: <4 w/ prns Outcome: Progressing   Problem: RH KNOWLEDGE DEFICIT Goal: RH STG INCREASE KNOWLEDGE OF DIABETES Description: Manage increase knowledge of diabetes with supervision-min assistance from wife using educational materials provided Outcome: Progressing Goal: RH STG INCREASE KNOWLEDGE OF HYPERTENSION Description: Manage increase knowledge of hypertension with supervision-min assistance from wife using educational materials provided Outcome: Progressing Goal: RH STG INCREASE KNOWLEGDE OF HYPERLIPIDEMIA Description: Manage increase knowledge of hyperlipidemia  with supervision-min assistance from wife using educational materials provided Outcome: Progressing

## 2023-11-13 NOTE — Progress Notes (Signed)
 Occupational Therapy Session Note  Patient Details  Name: John Woodward. MRN: [id number] Date of Birth: [birth year]  Today's Date: 11/13/2023 OT Individual Time: 1000-1056 OT Individual Time Calculation (min): 56 min    Short Term Goals: Week 1:  OT Short Term Goal 1 (Week 1): Patient will complete 3/3 toileting tasks with CGA OT Short Term Goal 2 (Week 1): Patient will demonstrate improved safety awareness by locking wc breaks prior to stand with min cues OT Short Term Goal 3 (Week 1): Patient will complete LB dressing with CGA.   Skilled Therapeutic Interventions/Progress Updates:    Pt bed level at time of session, agreeable to shower level bathing. Note pt impulsive and L inattention limiting, cues throughout session for pacing and slowing down. Wife and son present for part of session, wife present after shower for bathing and mobility tasks - stating she is nervous about taking him home but was able to recall strategies and cuing from family ed. Pt performing shower level bathing with Supervision - note attempting some unsafe behaviors in shower but able to hold on with grab bar and instructed to do so at home too. Dried off mostly seated and UB dressing set up, LB dressing Supervision for underwear and pants with gripper socks. Shaving seated with electric razor before short distance mobility room <> day room to address endurance and safety with RW while maneuvering obstacles. Back in room st up alarm on call bell in reach.   Therapy Documentation Precautions:  Precautions Precautions: Fall Recall of Precautions/Restrictions: Impaired Restrictions Weight Bearing Restrictions Per Provider Order: No   Therapy/Group: Individual Therapy  Chiquita JAYSON Hopping 11/13/2023, 7:27 AM

## 2023-11-14 NOTE — Progress Notes (Signed)
+/-   sleep. Declined PRN trazodone when offered. I nap too much during the day, that's why I don't sleep at night. Using urinal. Patient reports LBM 10/18. Left side seems to constantly move while interacting with patient. Denies pain. Miaa Latterell A

## 2023-11-15 ENCOUNTER — Inpatient Hospital Stay (HOSPITAL_COMMUNITY)

## 2023-11-15 ENCOUNTER — Telehealth (HOSPITAL_COMMUNITY): Payer: Self-pay | Admitting: Pharmacy Technician

## 2023-11-15 ENCOUNTER — Other Ambulatory Visit (HOSPITAL_COMMUNITY): Payer: Self-pay

## 2023-11-15 LAB — CBC
HCT: 46.2 % (ref 39.0–52.0)
Hemoglobin: 15.1 g/dL (ref 13.0–17.0)
MCH: 31.9 pg (ref 26.0–34.0)
MCHC: 32.7 g/dL (ref 30.0–36.0)
MCV: 97.7 fL (ref 80.0–100.0)
Platelets: 248 K/uL (ref 150–400)
RBC: 4.73 MIL/uL (ref 4.22–5.81)
RDW: 13.9 % (ref 11.5–15.5)
WBC: 5.6 K/uL (ref 4.0–10.5)
nRBC: 0 % (ref 0.0–0.2)

## 2023-11-15 LAB — HEPATIC FUNCTION PANEL
ALT: 32 U/L (ref 0–44)
AST: 28 U/L (ref 15–41)
Albumin: 3.4 g/dL — ABNORMAL LOW (ref 3.5–5.0)
Alkaline Phosphatase: 58 U/L (ref 38–126)
Bilirubin, Direct: 0.1 mg/dL (ref 0.0–0.2)
Indirect Bilirubin: 0.6 mg/dL (ref 0.3–0.9)
Total Bilirubin: 0.7 mg/dL (ref 0.0–1.2)
Total Protein: 6.2 g/dL — ABNORMAL LOW (ref 6.5–8.1)

## 2023-11-15 LAB — PROTIME-INR
INR: 1.1 (ref 0.8–1.2)
Prothrombin Time: 15.1 s (ref 11.4–15.2)

## 2023-11-15 LAB — BASIC METABOLIC PANEL WITH GFR
Anion gap: 12 (ref 5–15)
BUN: 20 mg/dL (ref 8–23)
CO2: 24 mmol/L (ref 22–32)
Calcium: 9.1 mg/dL (ref 8.9–10.3)
Chloride: 106 mmol/L (ref 98–111)
Creatinine, Ser: 1.51 mg/dL — ABNORMAL HIGH (ref 0.61–1.24)
GFR, Estimated: 47 mL/min — ABNORMAL LOW (ref >60)
Glucose, Bld: 158 mg/dL — ABNORMAL HIGH (ref 70–99)
Potassium: 4.6 mmol/L (ref 3.5–5.1)
Sodium: 142 mmol/L (ref 135–145)

## 2023-11-15 LAB — APTT: aPTT: 38 s — ABNORMAL HIGH (ref 24–36)

## 2023-11-15 MED ORDER — DICLOFENAC SODIUM 1 % EX GEL
2.0000 g | Freq: Four times a day (QID) | CUTANEOUS | 0 refills | Status: AC
  Filled 2023-11-15: qty 100, 13d supply, fill #0

## 2023-11-15 MED ORDER — ACETAMINOPHEN 325 MG PO TABS: 325.0000 mg | ORAL_TABLET | ORAL | Status: AC | PRN

## 2023-11-15 MED ORDER — OMEGA-3-ACID ETHYL ESTERS 1 G PO CAPS
1.0000 g | ORAL_CAPSULE | Freq: Two times a day (BID) | ORAL | 0 refills | Status: DC
  Filled 2023-11-15: qty 60, 30d supply, fill #0

## 2023-11-15 MED ORDER — GABAPENTIN 100 MG PO CAPS
100.0000 mg | ORAL_CAPSULE | Freq: Every day | ORAL | 0 refills | Status: DC
  Filled 2023-11-15: qty 30, 30d supply, fill #0

## 2023-11-15 MED ORDER — DONEPEZIL HCL 5 MG PO TABS
5.0000 mg | ORAL_TABLET | Freq: Every day | ORAL | 0 refills | Status: DC
  Filled 2023-11-15: qty 30, 30d supply, fill #0

## 2023-11-15 MED ORDER — LOSARTAN POTASSIUM 50 MG PO TABS
50.0000 mg | ORAL_TABLET | Freq: Every day | ORAL | Status: DC
  Administered 2023-11-16: 50 mg via ORAL
  Filled 2023-11-15: qty 1

## 2023-11-15 MED ORDER — SENNOSIDES-DOCUSATE SODIUM 8.6-50 MG PO TABS
2.0000 | ORAL_TABLET | Freq: Every day | ORAL | 0 refills | Status: DC
  Filled 2023-11-15: qty 60, 30d supply, fill #0

## 2023-11-15 NOTE — Progress Notes (Signed)
 Physical Therapy Session Note  Patient Details  Name: John Woodward. MRN: [id number] Date of Birth: 01/02/1946  Today's Date: 11/15/2023 PT Individual Time: 1134-1201 PT Individual Time Calculation (min): 27 min   Short Term Goals: Week 1:  PT Short Term Goal 1 (Week 1): STGs = LTGs  Skilled Therapeutic Interventions/Progress Updates:     Pt received supine in bed and agrees to therapy. No complaint of pain. Pt and wife eager to practice ambulation with rollator. PT provides education on safe rollator use, as well as potential risks of use. Pt performs bed mobility with cues for positioning at EOB. Pt performs sit to stand with cues for safe management of rollator. Pt ambulates x250' with rollator, with cues for decreased WB through AD for energy conservation. And improved body mechanics. PT provides cues on bracing rollator against wall to take rest breaks. Pt takes extended seated rest break. Pt then ambulates and navigates ramp with rollator and cues for safety and sequencing. Pt ambulates additional 2x250' with same assistance and cues. Left supine with all needs within reach.  Therapy Documentation Precautions:  Precautions Precautions: Fall Recall of Precautions/Restrictions: Impaired Precaution/Restrictions Comments: L-side athetoid movements; history of L-ankle fracture Restrictions Weight Bearing Restrictions Per Provider Order: No    Therapy/Group: Individual Therapy  Elsie JAYSON Dawn, PT, DPT 11/15/2023, 12:56 PM

## 2023-11-15 NOTE — Discharge Instructions (Addendum)
 Inpatient Rehab Discharge Instructions  John Woodward. Discharge date and time:    Activities/Precautions/ Functional Status: Activity: no lifting, driving, or strenuous exercise till cleared by MD Diet: diabetic diet Wound Care: none needed   Functional status:  ___ No restrictions     ___ Walk up steps independently _X__ 24/7 supervision/assistance   ___ Walk up steps with assistance ___ Intermittent supervision/assistance  ___ Bathe/dress independently ___ Walk with walker     _X__ Bathe/dress with assistance ___ Walk Independently    ___ Shower independently ___ Walk with assistance    ___ Shower with assistance _X__ No alcohol     ___ Return to work/school ________  Special Instructions: Wear left ankle brace when out of bed (support ankle/prevent injury)   COMMUNITY REFERRALS UPON DISCHARGE:    Home Health:   PT      OT                 Agency:[facility]    Phone:[phone]  *Please expect follow-up within 2-3 business days for discharge to schedule your home visit. If you have not received follow-up, be sure to contact the site directly.*   Medical Equipment/Items Ordered:3in1 BSC and RW                                                 Agency/Supplier: [facility] [id number]   STROKE/TIA DISCHARGE INSTRUCTIONS SMOKING Cigarette smoking nearly doubles your risk of having a stroke & is the single most alterable risk factor  If you smoke or have smoked in the last 12 months, you are advised to quit smoking for your health. Most of the excess cardiovascular risk related to smoking disappears within a year of stopping. Ask you doctor about anti-smoking medications [place] Quit Line: 1-800-QUIT NOW Free Smoking Cessation Classes [phone]  CHOLESTEROL Know your levels; limit fat & cholesterol in your diet  Lipid Panel     Component Value Date/Time   CHOL 150 11/02/2023 0337   CHOL 175 05/14/2022 0919   TRIG 272 (H) 11/02/2023 0337   HDL 44  11/02/2023 0337   HDL 43 05/14/2022 0919   CHOLHDL 3.4 11/02/2023 0337   VLDL 54 (H) 11/02/2023 0337   LDLCALC 52 11/02/2023 0337   LDLCALC 104 (H) 05/14/2022 0919     Many patients benefit from treatment even if their cholesterol is at goal. Goal: Total Cholesterol (CHOL) less than 160 Goal:  Triglycerides (TRIG) less than 150 Goal:  HDL greater than 40 Goal:  LDL (LDLCALC) less than 100   BLOOD PRESSURE American Stroke Association blood pressure target is less that 120/80 mm/Hg  Your discharge blood pressure is:  BP: 133/70 Monitor your blood pressure Limit your salt and alcohol intake Many individuals will require more than one medication for high blood pressure  DIABETES (A1c is a blood sugar average for last 3 months) Goal HGBA1c is under 7% (HBGA1c is blood sugar average for last 3 months)  Diabetes:     Lab Results  Component Value Date   HGBA1C 6.4 (H) 11/02/2023    Your HGBA1c can be lowered with medications, healthy diet, and exercise. Check your blood sugar as directed by your physician Call your physician if you experience unexplained or low blood sugars.  PHYSICAL ACTIVITY/REHABILITATION Goal is 30 minutes at least 4 days per week  Activity: No driving, Therapies: see above  Return to work: N/A Activity decreases your risk of heart attack and stroke and makes your heart stronger.  It helps control your weight and blood pressure; helps you relax and can improve your mood. Participate in a regular exercise program. Talk with your doctor about the best form of exercise for you (dancing, walking, swimming, cycling).  DIET/WEIGHT Goal is to maintain a healthy weight  Your discharge diet is:  Diet Order             Diet Carb Modified Fluid consistency: Thin; Room service appropriate? Yes with Assist  Diet effective now                   liquids Your height is:  Height: 5' 11 (180.3 cm) Your current weight is: Weight: 68.2 kg Your Body Mass Index (BMI) is:   BMI (Calculated): 20.98 Following the type of diet specifically designed for you will help prevent another stroke. You are at goal weight  Your goal Body Mass Index (BMI) is 19-24. Healthy food habits can help reduce 3 risk factors for stroke:  High cholesterol, hypertension, and excess weight.  RESOURCES Stroke/Support Group:  Call [phone]   STROKE EDUCATION PROVIDED/REVIEWED AND GIVEN TO PATIENT Stroke warning signs and symptoms How to activate emergency medical system (call 911). Medications prescribed at discharge. Need for follow-up after discharge. Personal risk factors for stroke. Pneumonia vaccine given:  Flu vaccine given:  My questions have been answered, the writing is legible, and I understand these instructions.  I will adhere to these goals & educational materials that have been provided to me after my discharge from the hospital.       My questions have been answered and I understand these instructions. I will adhere to these goals and the provided educational materials after my discharge from the hospital.  Patient/Caregiver Signature _______________________________ Date __________  Clinician Signature _______________________________________ Date __________  Please bring this form and your medication list with you to all your follow-up doctor's appointments.

## 2023-11-15 NOTE — Progress Notes (Signed)
 Physical Therapy Discharge Summary  Patient Details  Name: John Woodward. MRN: [id number] Date of Birth: 07-07-1945  Date of Discharge from PT service:November 15, 2023  Today's Date: 11/15/2023 PT Individual Time: 0950-1102 PT Individual Time Calculation (min): 72 min    Patient has met 9 of 9 long term goals due to improved activity tolerance, improved balance, improved postural control, increased strength, functional use of  left upper extremity and left lower extremity, improved attention, improved awareness, and improved coordination.  Patient to discharge at an ambulatory level Supervision.   Patient's care partner is independent to provide the necessary physical and cognitive assistance at discharge.  Reasons goals not met: N/A  Recommendation:  Patient will benefit from ongoing skilled PT services in home health setting to continue to advance safe functional mobility, address ongoing impairments in gait mechanics, LUE/LLE coordination, dynamic standing balance, and minimize fall risk.  Equipment: RW  Reasons for discharge: treatment goals met and discharge from hospital  Patient/family agrees with progress made and goals achieved: Yes  PT Discharge Skilled Treatment Interventions: Pt presents in room in [facility], agreeable to PT. Pt denies pain. Session focused on therapeutic activities to facilitate DC planning and preparing for return to home as well as gait training with rollator. Pt completes sit to stand transfers with RW/rollator with supervision throughout session. Pt completes gait 180' with RW with close supervision, pt demonstrating LLE ataxia during gait with inconsistency with step length and gait speed as well as picking up L side of RW during gait - pt unaware until therapist cues. Pt completes up/down 12 steps with BHRs with supervision, pt reporting family is placing a 2nd handrail. Pt TUG in 22.9 seconds indicating increased risk for falls. Pt completes gait with RW  on unit to/from ortho gym, up/down ramp with supervision, and completes car transfer with supervision. Therapist places 5# ankle weight on L side of RW to increase proprioception as well as prevent pt from lifting up L side of RW with pt demonstrating improved stability with this intervention. Pt reports that he has a rollator at home that he plans to use at DC, therapist trials gait with rollator. Pt completes gait 200' with one seated rest break on rollator with supervision, pt demonstrating improved control with rollator, improved consistency of gait speed and no instance of picking up left side of rollator. Pt also demonstrating improved step length with LLE due to seat of rollator placement with decreased scissoring noted. Pt manages parking rollator up against wall, locking rollator prior to turning to sit. Pt demonstrates turning to sit in [facility] with rollator with pt able to lock rollator brakes without cueing. Pt ambulates back to room with supervision with rollator, pt wife present and educated on use of rollator in the home and differences between rollator and RW with pt wife verbalizing understanding. Pt remains semi reclined in bed with all needs within reach, call light in place, bed alarm activated at end of session.  Precautions/Restrictions Precautions Precautions: Fall Recall of Precautions/Restrictions: Impaired Precaution/Restrictions Comments: L-side athetoid movements; history of L-ankle fracture Restrictions Weight Bearing Restrictions Per Provider Order: No Pain Interference Pain Interference Pain Effect on Sleep: 1. Rarely or not at all Pain Interference with Therapy Activities: 1. Rarely or not at all Pain Interference with Day-to-Day Activities: 1. Rarely or not at all Vision/Perception  Vision - History Ability to See in Adequate Light: 0 Adequate Perception Perception: Impaired Preception Impairment Details: Spatial orientation Perception-Other Comments: L-side  impacted Praxis Praxis:  Impaired Praxis Impairment Details: Motor planning Praxis-Other Comments: L-side impacted  Cognition Overall Cognitive Status: History of cognitive impairments - at baseline Arousal/Alertness: Awake/alert Memory: Impaired Memory Impairment: Storage deficit;Decreased recall of new information;Retrieval deficit;Decreased short term memory Awareness: Impaired Awareness Impairment: Intellectual impairment Problem Solving: Impaired Problem Solving Impairment: Verbal complex;Functional complex;Functional basic Safety/Judgment: Impaired Comments: Anticipate poor awareness/memory deficits negatively impact safety post-stroke. Sensation Sensation Light Touch: Impaired Detail Peripheral sensation comments: LLE numbness related to work duties; LUE post-stroke Light Touch Impaired Details: Impaired LLE;Impaired LUE Proprioception: Impaired Detail Proprioception Impaired Details: Impaired LLE;Impaired LUE Coordination Gross Motor Movements are Fluid and Coordinated: No Fine Motor Movements are Fluid and Coordinated: No Coordination and Movement Description: Deficits due to L-sided uncoordination. Finger Nose Finger Test: Dysmetria Motor  Motor Motor: Abnormal postural alignment and control;Motor impersistence Motor - Discharge Observations: L-hemiparesis, athetoid-like movements with LUE/LLE.  Mobility Transfers Transfers: Sit to Stand;Stand to Sit;Stand Pivot Transfers Sit to Stand: Supervision/Verbal cueing Stand to Sit: Supervision/Verbal cueing Stand Pivot Transfers: Supervision/Verbal cueing Stand Pivot Transfer Details: Verbal cues for precautions/safety;Verbal cues for safe use of DME/AE Transfer (Assistive device): Rolling walker Locomotion  Gait Ambulation: Yes Gait Assistance: Supervision/Verbal cueing Gait Distance (Feet): 180 Feet Assistive device: Rolling walker Gait Assistance Details: Verbal cues for safe use of DME/AE Gait Gait: Yes Gait  Pattern: Impaired Gait Pattern: Ataxic Gait velocity: increased Stairs / Additional Locomotion Stairs: Yes Stairs Assistance: Supervision/Verbal cueing Stair Management Technique: Two rails Number of Stairs: 12 Height of Stairs: 6 Wheelchair Mobility Wheelchair Mobility: No  Trunk/Postural Assessment  Cervical Assessment Cervical Assessment: Exceptions to [facility] (Forward head) Thoracic Assessment Thoracic Assessment: Exceptions to [facility] (Rounded shoulders) Lumbar Assessment Lumbar Assessment: Exceptions to [facility] (Posterior pelvic tilt) Postural Control Postural Control: Deficits on evaluation Righting Reactions: Decreased/delayed Protective Responses: Decreased/delayed  Balance Balance Balance Assessed: Yes Standardized Balance Assessment Standardized Balance Assessment: Timed Up and Go Test Timed Up and Go Test TUG: Normal TUG Normal TUG (seconds): 22.9 Static Sitting Balance Static Sitting - Balance Support: Feet supported Static Sitting - Level of Assistance: 7: Independent Dynamic Sitting Balance Dynamic Sitting - Balance Support: Feet supported;During functional activity Dynamic Sitting - Level of Assistance: 6: Modified independent (Device/Increase time) Static Standing Balance Static Standing - Balance Support: During functional activity;Bilateral upper extremity supported Static Standing - Level of Assistance: 5: Stand by assistance Dynamic Standing Balance Dynamic Standing - Balance Support: During functional activity Dynamic Standing - Level of Assistance: 5: Stand by assistance Extremity Assessment  RUE Assessment RUE Assessment: Within Functional Limits LUE Assessment LUE Assessment: Exceptions to [facility] RLE Assessment RLE Assessment: Within Functional Limits LLE Assessment LLE Assessment: Within Functional Limits General Strength Comments: coordination impaired   Reche Ohara PT, DPT 11/15/2023, 10:27 AM

## 2023-11-15 NOTE — Plan of Care (Signed)
  Problem: RH Balance Goal: LTG: Patient will maintain dynamic sitting balance (OT) Description: LTG:  Patient will maintain dynamic sitting balance with assistance during activities of daily living (OT) Outcome: Adequate for Discharge   Problem: RH Balance Goal: LTG Patient will maintain dynamic standing with ADLs (OT) Description: LTG:  Patient will maintain dynamic standing balance with assist during activities of daily living (OT)  Outcome: Completed/Met   Problem: Sit to Stand Goal: LTG:  Patient will perform sit to stand in prep for activites of daily living with assistance level (OT) Description: LTG:  Patient will perform sit to stand in prep for activites of daily living with assistance level (OT) Outcome: Completed/Met   Problem: RH Eating Goal: LTG Patient will perform eating w/assist, cues/equip (OT) Description: LTG: Patient will perform eating with assist, with/without cues using equipment (OT) Outcome: Completed/Met   Problem: RH Grooming Goal: LTG Patient will perform grooming w/assist,cues/equip (OT) Description: LTG: Patient will perform grooming with assist, with/without cues using equipment (OT) Outcome: Completed/Met   Problem: RH Bathing Goal: LTG Patient will bathe all body parts with assist levels (OT) Description: LTG: Patient will bathe all body parts with assist levels (OT) Outcome: Completed/Met   Problem: RH Dressing Goal: LTG Patient will perform upper body dressing (OT) Description: LTG Patient will perform upper body dressing with assist, with/without cues (OT). Outcome: Completed/Met Goal: LTG Patient will perform lower body dressing w/assist (OT) Description: LTG: Patient will perform lower body dressing with assist, with/without cues in positioning using equipment (OT) Outcome: Completed/Met   Problem: RH Toileting Goal: LTG Patient will perform toileting task (3/3 steps) with assistance level (OT) Description: LTG: Patient will perform  toileting task (3/3 steps) with assistance level (OT)  Outcome: Completed/Met   Problem: RH Functional Use of Upper Extremity Goal: LTG Patient will use RT/LT upper extremity as a (OT) Description: LTG: Patient will use right/left upper extremity as a stabilizer/gross assist/diminished/nondominant/dominant level with assist, with/without cues during functional activity (OT) Outcome: Completed/Met   Problem: RH Toilet Transfers Goal: LTG Patient will perform toilet transfers w/assist (OT) Description: LTG: Patient will perform toilet transfers with assist, with/without cues using equipment (OT) Outcome: Completed/Met   Problem: RH Tub/Shower Transfers Goal: LTG Patient will perform tub/shower transfers w/assist (OT) Description: LTG: Patient will perform tub/shower transfers with assist, with/without cues using equipment (OT) Outcome: Completed/Met

## 2023-11-15 NOTE — Progress Notes (Signed)
 PROGRESS NOTE   Subjective/Complaints: No events overnight.  No complaints this a.m., but does have a mild cough on exam.  States it started overnight. Vitals with some increased blood pressure, some bradycardia.  A.m. labs stable, downtrending creatinine 1.29.    11/15/2023    4:12 AM 11/14/2023    7:55 PM 11/14/2023    3:17 PM  Vitals with BMI  Systolic 141 154 840  Diastolic 59 75 70  Pulse 57 59 59    P.o. intakes appropriate  Continent of bladder  Last BM 10/18 per patient; 10-15 per record  ROS: Patient denies fever, rash, sore throat, blurred vision, dizziness, nausea, vomiting, diarrhea, cough, shortness of breath or chest pain, joint or back/neck pain, headache, or mood change.  + Cough Objective:   No results found. No results for input(s): WBC, HGB, HCT, PLT in the last 72 hours.  No results for input(s): NA, K, CL, CO2, GLUCOSE, BUN, CREATININE, CALCIUM  in the last 72 hours.   Intake/Output Summary (Last 24 hours) at 11/15/2023 0800 Last data filed at 11/15/2023 0500 Gross per 24 hour  Intake 960 ml  Output 825 ml  Net 135 ml        Physical Exam: Vital Signs Blood pressure (!) 141/59, pulse (!) 57, temperature 97.9 F (36.6 C), resp. rate 15, height 5' 11 (1.803 m), weight 68.2 kg, SpO2 95%.   Constitutional: No distress . Vital signs reviewed. HEENT: NCAT, EOMI, oral membranes moist Neck: supple Cardiovascular: RRR without murmur. No JVD    Respiratory/Chest: Mild expiratory crackles in the central fields, otherwise clear to auscultation.  On room air.    GI/Abdomen: BS +, non-tender, non-distended Ext: no clubbing, cyanosis, or edema Psych: pleasant and cooperative  Skin: No evidence of breakdown, no evidence of rash Neurologic: Cranial nerves II through XII intact, motor strength is 5/5 in RIght and 4/5 left  deltoid, bicep, tricep, grip, hip flexor, knee  extensors, ankle dorsiflexor and plantar flexor--ongoing + Motor restlessness with left upper and lower extremity--somewhat improved + Poor attention/memory--seemed more appropriate to me   Mild left upper extremity ataxia  Musculoskeletal: + Left lateral ankle scar, limited range of motion in dorsiflexion, plantarflexion.  No apparent deformity, no TTP currently   Assessment/Plan: 1. Functional deficits which require 3+ hours per day of interdisciplinary therapy in a comprehensive inpatient rehab setting. Physiatrist is providing close team supervision and 24 hour management of active medical problems listed below. Physiatrist and rehab team continue to assess barriers to discharge/monitor patient progress toward functional and medical goals  Care Tool:  Bathing    Body parts bathed by patient: Right arm, Left arm, Abdomen, Chest, Front perineal area, Buttocks, Right upper leg, Left upper leg, Right lower leg, Left lower leg, Face         Bathing assist Assist Level: Supervision/Verbal cueing     Upper Body Dressing/Undressing Upper body dressing   What is the patient wearing?: Pull over shirt    Upper body assist Assist Level: Set up assist    Lower Body Dressing/Undressing Lower body dressing      What is the patient wearing?: Pants, Underwear/pull up     Lower  body assist Assist for lower body dressing: Supervision/Verbal cueing     Toileting Toileting    Toileting assist Assist for toileting: Contact Guard/Touching assist     Transfers Chair/bed transfer  Transfers assist     Chair/bed transfer assist level: Supervision/Verbal cueing     Locomotion Ambulation   Ambulation assist      Assist level: Moderate Assistance - Patient 50 - 74% Assistive device: No Device Max distance: 120   Walk 10 feet activity   Assist     Assist level: Moderate Assistance - Patient - 50 - 74% Assistive device: No Device   Walk 50 feet activity   Assist     Assist level: Moderate Assistance - Patient - 50 - 74% Assistive device: No Device    Walk 150 feet activity   Assist    Assist level: Minimal Assistance - Patient > 75% Assistive device: No Device    Walk 10 feet on uneven surface  activity   Assist     Assist level: Moderate Assistance - Patient - 50 - 74% Assistive device: Other (comment) (no device.)   Wheelchair     Assist Is the patient using a wheelchair?: Yes (per PT evaluation note) Type of Wheelchair: Manual    Wheelchair assist level: Supervision/Verbal cueing Max wheelchair distance: 200    Wheelchair 50 feet with 2 turns activity    Assist        Assist Level: Supervision/Verbal cueing   Wheelchair 150 feet activity     Assist      Assist Level: Supervision/Verbal cueing   Blood pressure (!) 141/59, pulse (!) 57, temperature 97.9 F (36.6 C), resp. rate 15, height 5' 11 (1.803 m), weight 68.2 kg, SpO2 95%.  Medical Problem List and Plan: 1. Functional deficits secondary to right basal ganglia IPH Poor motor control left side alien arm and leg , no sensory loss             -patient may  shower             -ELOS/Goals: 10 to 12 days, supervision PT, min assist OT, supervision SLP - 10/21             - stable to continue inpatient rehab   - 10/14: SPV for UB, Min A LB, no awareness of deficits and poor activity tolerance. Attention deficits. CGA transfers and ambulation up to 175 with RW. L sided inattention limiting. At cognitive baseline--per wife has been managing his ADLs since 2023 and he does not drive.      -Continue CIR therapies including PT, OT, and SLP  2.  Antithrombotics: -DVT/anticoagulation:  Pharmaceutical: Lovenox --plan to start aspire trial on discharge, will reach out to Dr. Rosemarie  10-20             -antiplatelet therapy: N/A at this time.   3. Pain Management: Tylenol  prn.   -10-13: Chronic left lateral ankle pain.  Add Voltaren gel 4 times  daily--improved  - 10-16: Trial gabapentin 100 mg nightly for restless legs--significantly improved  4. Mood/Behavior/Sleep: LCSW to follow for evaluation and support.              -antipsychotic agents: N/A  - 10-20: Requires encouragement for as needed trazodone, does not want scheduled.   5. Neuropsych/cognition: This patient is not capable of making decisions on his own behalf.             - 10-10: Restlessness without agitation.  No unsafe impulsive behaviors seen   -  10/14: Cog deficits progressive since 2023; start donepezil 5 mg daily after discussing with patient and his wife--seems slightly improved 10-15   6. Skin/Wound Care: Routine pressure relief measures.  7. Fluids/Electrolytes/Nutrition: Monitor I/O. Check CMET in am            8. PAF: Monitor HR TID--off coumadin  due to ICH             --family to decide on Aspire trial (Eliquis  v/s ASA)  - Patient was prior on Eliquis  but could not afford, was switched to Coumadin , resulting in bleed   - 10-16: Family with considerable concerns regarding inspire trial, versus retrial of Eliquis  versus alternative like Xarelto or Pradaxa.  Discussing Eliquis  prior authorization with pharmacy and formulary alternatives so family can discuss options prior to discussion with Dr. Rosemarie 10-20--Pharmacy continuing working on cost estimate 10-17  9. T2DM: HGb A1C- 6.4 and well controlled on diet alone.          CBG (last 3)  10-13 blood sugars well-controlled on current regimen; DC CBGs  10. HTN: Monitor BP TID. SBP goal , 160.   - 10-15: Increasing hypertension, heart rate low.  Resume amlodipine  10 mg daily.  Continue hold losartan  50 mg daily in setting of AKI.  10-16: Slightly improved with amlodipine , monitor 1 to 2 days then if no large drop resume losartan  25 mg daily  10-17: Resume losartan  at 25 mg daily; watch 1-2 days then can increase to home 50 mg daily if appropriate   10-20: Some mild bradycardia overnight, EKG today to monitor  for AV block with donepezil.  Increase losartan  to 50 mg; if no other factors, will reduce amlodipine . Vitals:   11/14/23 1955 11/15/23 0412  BP: (!) 154/75 (!) 141/59  Pulse: (!) 59 (!) 57  Resp: 16 15  Temp: 98.4 F (36.9 C) 97.9 F (36.6 C)  SpO2: 98% 95%    11. Mild cognitive impairment: Last seen by Neuro 11/22- per family does not have appeared to worsen with ICH  12. Elevated Lipids: Trig-272. VLDL-54. ON              --On Crestor  40 mg, add lovaza- will need repeat trig level in 1 mo   13.  CKD.  Baseline outpatient creatinine 1.3-1.5.   - 10-16: Creatinine downtrending/improved.  Has remained between 1.2 and 1.3 throughout hospitalization.    - 10-20: Creatinine 1.29, improving. 14. Constipation.   - LBM 10/15. Monitor.  - Patient reports last bowel movement 10-18  15.  Cough.  Has as needed Robitussin.  Get chest x-ray today  LOS: 10 days A FACE TO FACE EVALUATION WAS PERFORMED  John Woodward Likes 11/15/2023, 8:00 AM

## 2023-11-15 NOTE — Progress Notes (Signed)
 Occupational Therapy Discharge Summary  Patient Details  Name: John Woodward. MRN: [id number] Date of Birth: 01/21/1946  Date of Discharge from OT service:November 15, 2023  Today's Date: 11/15/2023 OT Individual Time: 0805-0900 OT Individual Time Calculation (min): 55 min    Patient has met 11 of 11 long term goals due to improved activity tolerance, improved balance, ability to compensate for deficits, functional use of  LEFT upper and LEFT lower extremity, improved awareness, and improved coordination.  Patient to discharge at overall Supervision level.  Patient's care partner is independent to provide the necessary cognitive assistance at discharge.    Reasons goals not met: NA  Recommendation:  Patient will benefit from ongoing skilled OT services in outpatient setting to continue to advance functional skills in the area of BADL, iADL, and Reduce care partner burden.  Equipment: BSC & RW  Reasons for discharge: treatment goals met and discharge from hospital  Patient/family agrees with progress made and goals achieved: Yes  OT Discharge Precautions/Restrictions  Precautions Precautions: Fall Recall of Precautions/Restrictions: Impaired Precaution/Restrictions Comments: L-side athetoid movements; history of L-ankle fracture Restrictions Weight Bearing Restrictions Per Provider Order: No Pain Pain Assessment Pain Scale: 0-10 Pain Score: 0-No pain ADL ADL Eating: Modified independent Where Assessed-Eating: Bed level, Edge of bed Grooming: Modified independent Where Assessed-Grooming: Standing at sink Upper Body Bathing: Modified independent Where Assessed-Upper Body Bathing: Shower Lower Body Bathing: Supervision/safety Where Assessed-Lower Body Bathing: Shower Upper Body Dressing: Modified independent (Device) Where Assessed-Upper Body Dressing: Chair, Edge of bed Lower Body Dressing: Supervision/safety Where Assessed-Lower Body Dressing: Edge of bed,  Chair Toileting: Supervision/safety Where Assessed-Toileting: Toilet, Psychiatrist Transfer: Close supervision Toilet Transfer Method: Proofreader: Bedside commode, Other (comment) (RW) Tub/Shower Transfer: Not assessed Film/video editor: Close supervision Film/video editor Method: Designer, industrial/product: Grab bars, Other (comment), Transfer tub bench Vision Baseline Vision/History: 1 Wears glasses Patient Visual Report: No change from baseline Vision Assessment?: No apparent visual deficits Perception  Perception: Impaired Perception-Other Comments: L-side impacted Praxis Praxis: Impaired Praxis Impairment Details: Motor planning Praxis-Other Comments: L-side impacted Cognition Cognition Overall Cognitive Status: History of cognitive impairments - at baseline Arousal/Alertness: Awake/alert Memory: Impaired Memory Impairment: Storage deficit;Decreased recall of new information;Retrieval deficit;Decreased short term memory Awareness: Impaired Awareness Impairment: Intellectual impairment Problem Solving: Impaired Problem Solving Impairment: Verbal complex;Functional complex;Functional basic Safety/Judgment: Impaired Comments: Anticipate poor awareness/memory deficits negatively impact safety post-stroke. Brief Interview for Mental Status (BIMS) Repetition of Three Words (First Attempt): 3 Temporal Orientation: Year: Correct Temporal Orientation: Month: Accurate within 5 days Temporal Orientation: Day: Correct Recall: Sock: No, could not recall Recall: Blue: Yes, no cue required Recall: Bed: Yes, after cueing (a piece of furniture) BIMS Summary Score: 12 Sensation Sensation Light Touch: Impaired Detail Peripheral sensation comments: LLE numbness related to work duties; LUE post-stroke [employer] Impaired Details: Impaired LLE;Impaired LUE Proprioception: Impaired Detail Proprioception Impaired Details:  Impaired LLE;Impaired LUE Coordination Gross Motor Movements are Fluid and Coordinated: No Fine Motor Movements are Fluid and Coordinated: No Coordination and Movement Description: Deficits due to L-sided uncoordination. Finger Nose Finger Test: Dysmetria Motor  Motor Motor: Abnormal postural alignment and control;Motor impersistence Motor - Discharge Observations: L-hemiparesis, athetoid-like movements with LUE/LLE. Mobility  Transfers Sit to Stand: Supervision/Verbal cueing Stand to Sit: Supervision/Verbal cueing  Trunk/Postural Assessment  Cervical Assessment Cervical Assessment: Exceptions to [facility] (Forward head) Thoracic Assessment Thoracic Assessment: Exceptions to [facility] (Rounded shoulders) Lumbar Assessment Lumbar Assessment: Exceptions to [facility] (Posterior pelvic tilt) Postural Control Postural  Control: Deficits on evaluation Righting Reactions: Decreased/delayed Protective Responses: Decreased/delayed  Balance Balance Balance Assessed: Yes Static Sitting Balance Static Sitting - Balance Support: Feet supported Static Sitting - Level of Assistance: 7: Independent Dynamic Sitting Balance Dynamic Sitting - Balance Support: Feet supported;During functional activity Dynamic Sitting - Level of Assistance: 6: Modified independent (Device/Increase time) Static Standing Balance Static Standing - Balance Support: During functional activity;Bilateral upper extremity supported Static Standing - Level of Assistance: 5: Stand by assistance (SUP) Dynamic Standing Balance Dynamic Standing - Balance Support: During functional activity Dynamic Standing - Level of Assistance: 5: Stand by assistance (SUP) Extremity/Trunk Assessment RUE Assessment RUE Assessment: Within Functional Limits LUE Assessment LUE Assessment: Exceptions to [facility]   Skilled Intervention:  Pt greeted resting in bed, increased encouragement for aurosal, but no reports of pain. Pt completes bathroom transfers with use  of RW at supervision level, but requires cuing for safe use of RW (I.e not pushing too far in-front or lifting AD to turn). Toileting and bathing with supervision, re-educated on LB garment unthreaded in sitting vs standing unilaterally to kick garments off. Heavy cuing provided for safe pacing and forced rest-breaks as patient seemingly unaware of increased SOB, but does voice . . . Boy that was a lot. OT assists with donning of L ASO due to complexity of garment. Pt remained sitting in WC with posey belt donned and door open.   John Woodward, OTR/L, MSOT  11/15/2023, 8:30 AM

## 2023-11-15 NOTE — Plan of Care (Signed)
  Problem: RH Balance Goal: LTG Patient will maintain dynamic standing balance (PT) Description: LTG:  Patient will maintain dynamic standing balance with assistance during mobility activities (PT) Outcome: Completed/Met   Problem: Sit to Stand Goal: LTG:  Patient will perform sit to stand with assistance level (PT) Description: LTG:  Patient will perform sit to stand with assistance level (PT) Outcome: Completed/Met   Problem: RH Bed Mobility Goal: LTG Patient will perform bed mobility with assist (PT) Description: LTG: Patient will perform bed mobility with assistance, with/without cues (PT). Outcome: Completed/Met   Problem: RH Bed to Chair Transfers Goal: LTG Patient will perform bed/chair transfers w/assist (PT) Description: LTG: Patient will perform bed to chair transfers with assistance (PT). Outcome: Completed/Met   Problem: RH Car Transfers Goal: LTG Patient will perform car transfers with assist (PT) Description: LTG: Patient will perform car transfers with assistance (PT). Outcome: Completed/Met   Problem: RH Ambulation Goal: LTG Patient will ambulate in controlled environment (PT) Description: LTG: Patient will ambulate in a controlled environment, # of feet with assistance (PT). Outcome: Completed/Met Goal: LTG Patient will ambulate in home environment (PT) Description: LTG: Patient will ambulate in home environment, # of feet with assistance (PT). Outcome: Completed/Met Goal: LTG Patient will ambulate in community environment (PT) Description: LTG: Patient will ambulate in community environment, # of feet with assistance (PT). Outcome: Completed/Met   Problem: RH Stairs Goal: LTG Patient will ambulate up and down stairs w/assist (PT) Description: LTG: Patient will ambulate up and down # of stairs with assistance (PT) Outcome: Completed/Met

## 2023-11-15 NOTE — Progress Notes (Signed)
 Patient ID: [name]., male   DOB: March 19, 1945, 78 y.o.   MRN: [id number]  31- SW spoke with pt wife to discuss HH or OPT. Prefers HH due to it being covered per benefits. No HHA preference. Reports she did not get a phone call from [facility]. SW will bring contact info for her to follow-up so she can make copay for DME.   HHPT/OT referral accepted by Angie[facility], [facility] 11/17/23.   SW met with pt wife to discuss above and give contact info.   Graeme Jude, MSW, LCSW Office: [phone] Cell: [phone] Fax: [phone]

## 2023-11-15 NOTE — Progress Notes (Signed)
 Agreed to take PRN trazodone, given at 2115. Slept good. Denies pain. Using urinal. John Woodward LABOR

## 2023-11-15 NOTE — Progress Notes (Incomplete)
 Inpatient Rehabilitation Discharge Medication Review by a Pharmacist   A complete drug regimen review was completed for this patient to identify any potential clinically significant medication issues.   High Risk Drug Classes Is patient taking? Indication by Medication  Antipsychotic No   Anticoagulant No   Antibiotic No    Opioid No    Antiplatelet No    Hypoglycemics/insulin  Yes Insulin  - DM   Vasoactive Medication Yes  Amlodipine , losartan - HTN   Chemotherapy No    Other Yes Trazodone prn sleep Escitalopram  - mood Rosuvastatin , lovaza - HLD Donepezil- memory Trazodone PRN- insomnia          Type of Medication Issue Identified Description of Issue Recommendation(s)  Drug Interaction(s) (clinically significant)        Duplicate Therapy        Allergy        No Medication Administration End Date        Incorrect Dose        Additional Drug Therapy Needed        Significant med changes from prior encounter (inform family/care partners about these prior to discharge). Warfarin on hold for ICH- provided infor for patient assistance for DOAC d/t previous issues with affordability vs. Participation in the ASPIRE trial - patient has already used 30d free card for apixaban    Communicate changes to patient/family at discharge from CIR.   Other            Clinically significant medication issues were identified that warrant physician communication and completion of prescribed/recommended actions by midnight of the next day:  No   Name of provider notified for urgent issues identified:    Provider Method of Notification:        Pharmacist comments:    Time spent performing this drug regimen review (minutes):  20 minutes

## 2023-11-15 NOTE — Telephone Encounter (Signed)
 Pharmacy Patient Advocate Encounter   Received notification from Inpatient Request that prior authorization for Omega-3-acid Ethyl Esters 1GM capsules is required/requested.   Insurance verification completed.   The patient is insured through [facility].   Per test claim: PA required; PA submitted to above mentioned insurance via Latent Key/confirmation #/EOC [id number] Status is pending

## 2023-11-15 NOTE — Progress Notes (Signed)
 Physical Therapy Session Note  Patient Details  Name: John Woodward. MRN: [id number] Date of Birth: 1945-09-14  Today's Date: 11/15/2023 PT Individual Time: 8669-8587 PT Individual Time Calculation (min): 42 min   Short Term Goals: Week 1:  PT Short Term Goal 1 (Week 1): STGs = LTGs  Skilled Therapeutic Interventions/Progress Updates:      Pt supine in bed upon arrival. Pt wife present throughout session. Pt denies any pain. Pt agreeable to therapy.   Pt and wife deny any questions/concerns regarding D/C 10/21.   Treatment session focused on gait training with rollator, and stair navigation.   Pt wife endorses they will be installing a 2nd handrail next week however will only have access to one handrail upon discharge. PT recommended caregiver provide CGA for stair navigation. Pt demonstrated stair navigation 2x8 with unilateral UE support on unilateral handrail and B UE support on unilateral handrail and CGA. Pt and wife both feel comfortable completing this at home.   Pt performed mass practice of ambulation with and seated rest breaks on the rollator with close supervision, mod verbal cues provided for safety with rollator with emphasis on slowing down, not riding the brakes and technique/sequencing with seated rest break. Pt demonstrates improved carry over with repetition.   Pt supine in bed at end of session with all needs within reach and bed alarm on.   Therapy Documentation Precautions:  Precautions Precautions: Fall Recall of Precautions/Restrictions: Impaired Restrictions Weight Bearing Restrictions Per Provider Order: No   Therapy/Group: Individual Therapy  [facility] Lyman, [place], DPT  11/15/2023, 7:43 AM

## 2023-11-16 ENCOUNTER — Other Ambulatory Visit (HOSPITAL_COMMUNITY): Payer: Self-pay

## 2023-11-16 DIAGNOSIS — I612 Nontraumatic intracerebral hemorrhage in hemisphere, unspecified: Secondary | ICD-10-CM

## 2023-11-16 MED ORDER — STUDY - ASPIRE - ASPIRIN 81 MG OR PLACEBO TABLET (PI-SETHI)
81.0000 mg | ORAL_TABLET | Freq: Every day | ORAL | Status: DC
  Administered 2023-11-16: 81 mg via ORAL
  Filled 2023-11-16: qty 1

## 2023-11-16 MED ORDER — STUDY - ASPIRE - ASPIRIN 81 MG OR PLACEBO TABLET (PI-SETHI): 81.0000 mg | ORAL_TABLET | Freq: Every day | ORAL | 0 refills | Status: DC

## 2023-11-16 MED ORDER — STUDY - ASPIRE - APIXABAN 5 MG OR PLACEBO TABLET (PI-SETHI)
5.0000 mg | ORAL_TABLET | Freq: Two times a day (BID) | ORAL | Status: DC
  Administered 2023-11-16: 5 mg via ORAL
  Filled 2023-11-16 (×2): qty 1

## 2023-11-16 MED ORDER — STUDY - ASPIRE - APIXABAN 5 MG OR PLACEBO TABLET (PI-SETHI): 5.0000 mg | ORAL_TABLET | Freq: Two times a day (BID) | ORAL | 0 refills | Status: DC

## 2023-11-16 NOTE — Telephone Encounter (Signed)
 Pharmacy Patient Advocate Encounter  Received notification from [facility] that Prior Authorization for Omega-3-acid Ethyl Esters 1GM Capsules has been APPROVED from 11/16/2023 to 01/26/2024   PA #/Case ID/Reference #: [id number]

## 2023-11-16 NOTE — Progress Notes (Signed)
 Inpatient Rehabilitation Care Coordinator Discharge Note   Patient Details  Name: Lateef Juncaj. MRN: [id number] Date of Birth: 06/13/1945   Discharge location: D/c to home  Length of Stay: 10 days  Discharge activity level: Supervision  Home/community participation: Limited  Patient response un:Yzjouy Literacy - How often do you need to have someone help you when you read instructions, pamphlets, or other written material from your doctor or pharmacy?: Rarely  Patient response un:Dnrpjo Isolation - How often do you feel lonely or isolated from those around you?: Never  Services provided included: PT, RD, MD, OT, RN, CM, TR, Pharmacy, Neuropsych, SW  Financial Services:  Field seismologist Utilized: Private Insurance [employer]  Choices offered to/list presented to: patient and wife  Follow-up services arranged:  DME, Home Health Home Health Agency: [facility] for HHPT/OT/SLP    DME : [facility] for [facility] and RW    Patient response to transportation need: Is the patient able to respond to transportation needs?: Yes In the past 12 months, has lack of transportation kept you from medical appointments or from getting medications?: No In the past 12 months, has lack of transportation kept you from meetings, work, or from getting things needed for daily living?: No   Patient/Family verbalized understanding of follow-up arrangements:  Yes  Individual responsible for coordination of the follow-up plan: contact pt wife  Confirmed correct DME delivered: Graeme DELENA Jude 11/16/2023    Comments (or additional information):fam edu completed  Summary of Stay    Date/Time Discharge Planning CSW  11/09/23 1048 Pt will d/c to home with his wife as primary caregiver. Fam edu scheduled for Thursday 9am-12pm. SW will confirm there are no barriers to discharge. AAC       Camdan Burdi A Jude

## 2023-11-16 NOTE — Plan of Care (Signed)
  Problem: RH BOWEL ELIMINATION Goal: RH STG MANAGE BOWEL WITH ASSISTANCE Description: STG Manage Bowel with supervision-min Assistance. Outcome: Progressing   Problem: RH BLADDER ELIMINATION Goal: RH STG MANAGE BLADDER WITH ASSISTANCE Description: STG Manage Bladder With supervision-min Assistance Outcome: Progressing   Problem: RH SKIN INTEGRITY Goal: RH STG SKIN FREE OF INFECTION/BREAKDOWN Description: Manage skin free of infection/breakdown with supervision to min assistance Outcome: Progressing

## 2023-11-16 NOTE — Progress Notes (Signed)
 PROGRESS NOTE   Subjective/Complaints: No events overnight.  Some mild hypotension overnight, but otherwise vitals are stable.  No complaints or concerns regarding discharge today.   ROS: Patient denies fever, rash, sore throat, blurred vision, dizziness, nausea, vomiting, diarrhea, cough, shortness of breath or chest pain, joint or back/neck pain, headache, or mood change.   Objective:   DG CHEST PORT 1 VIEW Result Date: 11/15/2023 CLINICAL DATA:  Cough.  Ex-smoker. EXAM: PORTABLE CHEST 1 VIEW COMPARISON:  06/29/2022 and chest CTA dated 06/29/2022. FINDINGS: Stable normal-sized heart. Tortuous and partially calcified thoracic aorta. The lungs remain clear and hyperexpanded. Mild peribronchial thickening with improvement. Unremarkable bones. IMPRESSION: 1. Mild bronchitic changes with improvement. 2. Stable changes of COPD. Electronically Signed   By: Elspeth Bathe M.D.   On: 11/15/2023 14:38   Recent Labs    11/15/23 0953  WBC 5.6  HGB 15.1  HCT 46.2  PLT 248    Recent Labs    11/15/23 0953  NA 142  K 4.6  CL 106  CO2 24  GLUCOSE 158*  BUN 20  CREATININE 1.51*  CALCIUM  9.1     Intake/Output Summary (Last 24 hours) at 11/16/2023 0944 Last data filed at 11/16/2023 0900 Gross per 24 hour  Intake 720 ml  Output 650 ml  Net 70 ml        Physical Exam: Vital Signs Blood pressure (!) 158/80, pulse 64, temperature 98.3 F (36.8 C), resp. rate 18, height 5' 11 (1.803 m), weight 68.2 kg, SpO2 93%.   Constitutional: No distress . Vital signs reviewed.  Sitting in wheelchair. HEENT: NCAT, EOMI, oral membranes moist.  Wearing glasses. Neck: supple Cardiovascular: RRR without murmur. No JVD    Respiratory/Chest: Clear to auscultation bilaterally.  On room air.    GI/Abdomen: BS +, non-tender, non-distended Ext: no clubbing, cyanosis, or edema Psych: pleasant and cooperative  Skin: No evidence of breakdown, no  evidence of rash Neurologic: Cranial nerves II through XII intact, motor strength is 5/5 in RIght and 4+/5 left  deltoid, bicep, tricep, grip, hip flexor, knee extensors, ankle dorsiflexor and plantar flexor--ongoing + Motor restlessness with left upper and lower extremity--ongoing + Poor attention/memory--stable  Mild left upper extremity and left lower extremity ataxia  Musculoskeletal: + Left lateral ankle scar, limited range of motion in dorsiflexion, plantarflexion.  No apparent deformity, no TTP currently   Assessment/Plan: 1. Functional deficits which require 3+ hours per day of interdisciplinary therapy in a comprehensive inpatient rehab setting. Physiatrist is providing close team supervision and 24 hour management of active medical problems listed below. Physiatrist and rehab team continue to assess barriers to discharge/monitor patient progress toward functional and medical goals  Care Tool:  Bathing    Body parts bathed by patient: Right arm, Left arm, Abdomen, Chest, Front perineal area, Buttocks, Right upper leg, Left upper leg, Right lower leg, Left lower leg, Face         Bathing assist Assist Level: Supervision/Verbal cueing     Upper Body Dressing/Undressing Upper body dressing   What is the patient wearing?: Pull over shirt    Upper body assist Assist Level: Independent with assistive device    Lower  Body Dressing/Undressing Lower body dressing      What is the patient wearing?: Pants, Underwear/pull up     Lower body assist Assist for lower body dressing: Supervision/Verbal cueing     Toileting Toileting    Toileting assist Assist for toileting: Supervision/Verbal cueing     Transfers Chair/bed transfer  Transfers assist     Chair/bed transfer assist level: Supervision/Verbal cueing     Locomotion Ambulation   Ambulation assist      Assist level: Moderate Assistance - Patient 50 - 74% Assistive device: No Device Max distance:  120   Walk 10 feet activity   Assist     Assist level: Moderate Assistance - Patient - 50 - 74% Assistive device: No Device   Walk 50 feet activity   Assist    Assist level: Moderate Assistance - Patient - 50 - 74% Assistive device: No Device    Walk 150 feet activity   Assist    Assist level: Minimal Assistance - Patient > 75% Assistive device: No Device    Walk 10 feet on uneven surface  activity   Assist     Assist level: Moderate Assistance - Patient - 50 - 74% Assistive device: Other (comment) (no device.)   Wheelchair     Assist Is the patient using a wheelchair?: Yes (per PT evaluation note) Type of Wheelchair: Manual    Wheelchair assist level: Supervision/Verbal cueing Max wheelchair distance: 200    Wheelchair 50 feet with 2 turns activity    Assist        Assist Level: Supervision/Verbal cueing   Wheelchair 150 feet activity     Assist      Assist Level: Supervision/Verbal cueing   Blood pressure (!) 158/80, pulse 64, temperature 98.3 F (36.8 C), resp. rate 18, height 5' 11 (1.803 m), weight 68.2 kg, SpO2 93%.  Medical Problem List and Plan: 1. Functional deficits secondary to right basal ganglia IPH Poor motor control left side alien arm and leg , no sensory loss             -patient may  shower             -ELOS/Goals: 10 to 12 days, supervision PT, min assist OT, supervision SLP - 10/21             - stable to continue inpatient rehab   - 10/14: SPV for UB, Min A LB, no awareness of deficits and poor activity tolerance. Attention deficits. CGA transfers and ambulation up to 175 with RW. L sided inattention limiting. At cognitive baseline--per wife has been managing his ADLs since 2023 and he does not drive.      -Continue CIR therapies including PT, OT, and SLP   The patient is medically ready for discharge to home and will need follow-up with [facility] PM&R. In addition, they will need to follow up with their PCP,  Neurology.    2.  Antithrombotics: -DVT/anticoagulation:  Pharmaceutical: Lovenox --plan to start aspire trial on discharge - contacted Dr Rosemarie 10/20             -antiplatelet therapy: N/A at this time.   3. Pain Management: Tylenol  prn.   -10-13: Chronic left lateral ankle pain.  Add Voltaren gel 4 times daily--improved  - 10-16: Trial gabapentin 100 mg nightly for restless legs--significantly improved  4. Mood/Behavior/Sleep: LCSW to follow for evaluation and support.              -antipsychotic agents: N/A  -  10-20: Requires encouragement for as needed trazodone, does not want scheduled.   5. Neuropsych/cognition: This patient is not capable of making decisions on his own behalf.             - 10-10: Restlessness without agitation.  No unsafe impulsive behaviors seen   - 10/14: Cog deficits progressive since 2023; start donepezil 5 mg daily after discussing with patient and his wife--seems slightly improved 10-15   6. Skin/Wound Care: Routine pressure relief measures.  7. Fluids/Electrolytes/Nutrition: Monitor I/O. Check CMET in am            8. PAF: Monitor HR TID--off coumadin  due to ICH             --family to decide on Aspire trial (Eliquis  v/s ASA)  - Patient was prior on Eliquis  but could not afford, was switched to Coumadin , resulting in bleed   - 10-16: Family with considerable concerns regarding inspire trial, versus retrial of Eliquis  versus alternative like Xarelto or Pradaxa.  Discussing Eliquis  prior authorization with pharmacy and formulary alternatives so family can discuss options prior to discussion with Dr. Rosemarie 10-20- -10/20: Pharmacy continuing working on cost estimate 10-17--out-of-pocket Pradaxa approximately 200 hours/month, Eliquis  denied by insurance, Dr. Rosemarie informed re: ASPIRE trial  9. T2DM: HGb A1C- 6.4 and well controlled on diet alone.  10-13 blood sugars well-controlled on current regimen; DC CBGs  10. HTN: Monitor BP TID. SBP goal , 160.   -  10-15: Increasing hypertension, heart rate low.  Resume amlodipine  10 mg daily.  Continue hold losartan  50 mg daily in setting of AKI.  10-16: Slightly improved with amlodipine , monitor 1 to 2 days then if no large drop resume losartan  25 mg daily  10-17: Resume losartan  at 25 mg daily; watch 1-2 days then can increase to home 50 mg daily if appropriate   10-20: Some mild bradycardia overnight, EKG today to monitor for AV block with donepezil.  Increase losartan  to 50 mg; if no other factors, will reduce amlodipine .  10-21: Heart rate improved, blood pressure up a bit today so continue current losartan  and amlodipine .  EKG without AV nodal block. Vitals:   11/15/23 1939 11/16/23 0423  BP: (!) 141/63 (!) 158/80  Pulse: 60 64  Resp: 18 18  Temp: 98.2 F (36.8 C) 98.3 F (36.8 C)  SpO2: 96% 93%    11. Mild cognitive impairment: Last seen by Neuro 11/22- per family does not have appeared to worsen with ICH  12. Elevated Lipids: Trig-272. VLDL-54. ON              --On Crestor  40 mg, add lovaza- will need repeat trig level in 1 mo   13.  CKD.  Baseline outpatient creatinine 1.3-1.5.   - 10-16: Creatinine downtrending/improved.  Has remained between 1.2 and 1.3 throughout hospitalization.    - 10-21: Creatinine 1.5, baseline.  14. Constipation.   - LBM 10/15. Monitor.  - Patient reports last bowel movement 10-18  15.  Cough.  Has as needed Robitussin.  Get chest x-ray today--no acute abnormalities.  LOS: 11 days A FACE TO FACE EVALUATION WAS PERFORMED  Joesph JAYSON Likes 11/16/2023, 9:44 AM

## 2023-11-16 NOTE — Progress Notes (Signed)
 Inpatient Rehabilitation Discharge Medication Review by a Pharmacist   A complete drug regimen review was completed for this patient to identify any potential clinically significant medication issues.   High Risk Drug Classes Is patient taking? Indication by Medication  Antipsychotic No   Anticoagulant Yes Apixaban  vs Placebo study med - CVA  Antibiotic No    Opioid No    Antiplatelet Yes  ASA vs Placebo study med - CVA  Hypoglycemics/insulin  No   Vasoactive Medication Yes  Amlodipine  - HTN losartan - HTN   Chemotherapy No    Other Yes Acetaminophen  - pain Diclofenac - pain Donepezil- memory Escitalopram  - mood Gabapentin - pain Nicotine  gum - tobacco cessation Omega 3 - HLD Cresto - HLD Senna S - bowel regimen        Type of Medication Issue Identified Description of Issue Recommendation(s)  Drug Interaction(s) (clinically significant)        Duplicate Therapy        Allergy        No Medication Administration End Date        Incorrect Dose        Additional Drug Therapy Needed        Significant med changes from prior encounter (inform family/care partners about these prior to discharge). STOP Warfarin.  Enrolled in the  ASPIRE trial  Communicate changes to patient/family at discharge from CIR.   Other            Clinically significant medication issues were identified that warrant physician communication and completion of prescribed/recommended actions by midnight of the next day:  No   Name of provider notified for urgent issues identified:    Provider Method of Notification:        Pharmacist comments:    Time spent performing this drug regimen review (minutes):  20 minutes   [employer], Pharm.D., BCPS Clinical Pharmacist Clinical phone for 11/16/2023 from 7:30-3:00 is [phone].  **Pharmacist phone directory can be found on [email or web address] listed under [facility].  11/16/2023 12:42 PM

## 2023-11-17 DIAGNOSIS — N179 Acute kidney failure, unspecified: Secondary | ICD-10-CM | POA: Diagnosis not present

## 2023-11-17 DIAGNOSIS — E1151 Type 2 diabetes mellitus with diabetic peripheral angiopathy without gangrene: Secondary | ICD-10-CM | POA: Diagnosis not present

## 2023-11-17 DIAGNOSIS — I480 Paroxysmal atrial fibrillation: Secondary | ICD-10-CM | POA: Diagnosis not present

## 2023-11-17 DIAGNOSIS — I619 Nontraumatic intracerebral hemorrhage, unspecified: Secondary | ICD-10-CM | POA: Diagnosis not present

## 2023-11-17 DIAGNOSIS — I714 Abdominal aortic aneurysm, without rupture, unspecified: Secondary | ICD-10-CM | POA: Diagnosis not present

## 2023-11-17 DIAGNOSIS — G3184 Mild cognitive impairment, so stated: Secondary | ICD-10-CM | POA: Diagnosis not present

## 2023-11-17 DIAGNOSIS — I9589 Other hypotension: Secondary | ICD-10-CM | POA: Diagnosis not present

## 2023-11-17 DIAGNOSIS — R001 Bradycardia, unspecified: Secondary | ICD-10-CM | POA: Diagnosis not present

## 2023-11-17 DIAGNOSIS — E1169 Type 2 diabetes mellitus with other specified complication: Secondary | ICD-10-CM | POA: Diagnosis not present

## 2023-11-17 DIAGNOSIS — I129 Hypertensive chronic kidney disease with stage 1 through stage 4 chronic kidney disease, or unspecified chronic kidney disease: Secondary | ICD-10-CM | POA: Diagnosis not present

## 2023-11-17 DIAGNOSIS — Z7901 Long term (current) use of anticoagulants: Secondary | ICD-10-CM | POA: Diagnosis not present

## 2023-11-17 DIAGNOSIS — N1832 Chronic kidney disease, stage 3b: Secondary | ICD-10-CM | POA: Diagnosis not present

## 2023-11-17 DIAGNOSIS — J439 Emphysema, unspecified: Secondary | ICD-10-CM | POA: Diagnosis not present

## 2023-11-17 DIAGNOSIS — I69192 Facial weakness following nontraumatic intracerebral hemorrhage: Secondary | ICD-10-CM | POA: Diagnosis not present

## 2023-11-17 DIAGNOSIS — I69122 Dysarthria following nontraumatic intracerebral hemorrhage: Secondary | ICD-10-CM | POA: Diagnosis not present

## 2023-11-17 DIAGNOSIS — K5900 Constipation, unspecified: Secondary | ICD-10-CM | POA: Diagnosis not present

## 2023-11-17 DIAGNOSIS — J449 Chronic obstructive pulmonary disease, unspecified: Secondary | ICD-10-CM | POA: Diagnosis not present

## 2023-11-17 DIAGNOSIS — F339 Major depressive disorder, recurrent, unspecified: Secondary | ICD-10-CM | POA: Diagnosis not present

## 2023-11-17 DIAGNOSIS — I2510 Atherosclerotic heart disease of native coronary artery without angina pectoris: Secondary | ICD-10-CM | POA: Diagnosis not present

## 2023-11-17 DIAGNOSIS — I340 Nonrheumatic mitral (valve) insufficiency: Secondary | ICD-10-CM | POA: Diagnosis not present

## 2023-11-17 DIAGNOSIS — E1122 Type 2 diabetes mellitus with diabetic chronic kidney disease: Secondary | ICD-10-CM | POA: Diagnosis not present

## 2023-11-17 DIAGNOSIS — Z87891 Personal history of nicotine dependence: Secondary | ICD-10-CM | POA: Diagnosis not present

## 2023-11-17 DIAGNOSIS — E785 Hyperlipidemia, unspecified: Secondary | ICD-10-CM | POA: Diagnosis not present

## 2023-11-17 DIAGNOSIS — G936 Cerebral edema: Secondary | ICD-10-CM | POA: Diagnosis not present

## 2023-11-17 DIAGNOSIS — Z7982 Long term (current) use of aspirin: Secondary | ICD-10-CM | POA: Diagnosis not present

## 2023-11-17 DIAGNOSIS — Z8616 Personal history of COVID-19: Secondary | ICD-10-CM | POA: Diagnosis not present

## 2023-11-19 ENCOUNTER — Telehealth: Payer: Self-pay

## 2023-11-19 NOTE — Telephone Encounter (Signed)
 Ok for verbal orders ?

## 2023-11-19 NOTE — Telephone Encounter (Signed)
 Cb and gave verbal orders.

## 2023-11-19 NOTE — Telephone Encounter (Signed)
 Okay for verbal order?   Copied from CRM [id number]. Topic: Clinical - Home Health Verbal Orders >> Nov 19, 2023  3:08 PM Sasha M wrote: Caller/Agency: [facility] Callback Number: [phone] Service Requested: Physical Therapy Frequency: one week one, 2x for 3 weeks, once for 3 weeks Any new concerns about the patient? No

## 2023-11-23 DIAGNOSIS — I714 Abdominal aortic aneurysm, without rupture, unspecified: Secondary | ICD-10-CM | POA: Diagnosis not present

## 2023-11-23 DIAGNOSIS — G936 Cerebral edema: Secondary | ICD-10-CM | POA: Diagnosis not present

## 2023-11-23 DIAGNOSIS — E1122 Type 2 diabetes mellitus with diabetic chronic kidney disease: Secondary | ICD-10-CM | POA: Diagnosis not present

## 2023-11-23 DIAGNOSIS — J449 Chronic obstructive pulmonary disease, unspecified: Secondary | ICD-10-CM | POA: Diagnosis not present

## 2023-11-23 DIAGNOSIS — Z7982 Long term (current) use of aspirin: Secondary | ICD-10-CM | POA: Diagnosis not present

## 2023-11-23 DIAGNOSIS — I69122 Dysarthria following nontraumatic intracerebral hemorrhage: Secondary | ICD-10-CM | POA: Diagnosis not present

## 2023-11-23 DIAGNOSIS — I2510 Atherosclerotic heart disease of native coronary artery without angina pectoris: Secondary | ICD-10-CM | POA: Diagnosis not present

## 2023-11-23 DIAGNOSIS — N1832 Chronic kidney disease, stage 3b: Secondary | ICD-10-CM | POA: Diagnosis not present

## 2023-11-23 DIAGNOSIS — G3184 Mild cognitive impairment, so stated: Secondary | ICD-10-CM | POA: Diagnosis not present

## 2023-11-23 DIAGNOSIS — I69192 Facial weakness following nontraumatic intracerebral hemorrhage: Secondary | ICD-10-CM | POA: Diagnosis not present

## 2023-11-23 DIAGNOSIS — K5900 Constipation, unspecified: Secondary | ICD-10-CM | POA: Diagnosis not present

## 2023-11-23 DIAGNOSIS — F339 Major depressive disorder, recurrent, unspecified: Secondary | ICD-10-CM | POA: Diagnosis not present

## 2023-11-23 DIAGNOSIS — Z8616 Personal history of COVID-19: Secondary | ICD-10-CM | POA: Diagnosis not present

## 2023-11-23 DIAGNOSIS — I129 Hypertensive chronic kidney disease with stage 1 through stage 4 chronic kidney disease, or unspecified chronic kidney disease: Secondary | ICD-10-CM | POA: Diagnosis not present

## 2023-11-23 DIAGNOSIS — I9589 Other hypotension: Secondary | ICD-10-CM | POA: Diagnosis not present

## 2023-11-23 DIAGNOSIS — R001 Bradycardia, unspecified: Secondary | ICD-10-CM | POA: Diagnosis not present

## 2023-11-23 DIAGNOSIS — E785 Hyperlipidemia, unspecified: Secondary | ICD-10-CM | POA: Diagnosis not present

## 2023-11-23 DIAGNOSIS — J439 Emphysema, unspecified: Secondary | ICD-10-CM | POA: Diagnosis not present

## 2023-11-23 DIAGNOSIS — Z7901 Long term (current) use of anticoagulants: Secondary | ICD-10-CM | POA: Diagnosis not present

## 2023-11-23 DIAGNOSIS — I480 Paroxysmal atrial fibrillation: Secondary | ICD-10-CM | POA: Diagnosis not present

## 2023-11-23 DIAGNOSIS — E1169 Type 2 diabetes mellitus with other specified complication: Secondary | ICD-10-CM | POA: Diagnosis not present

## 2023-11-23 DIAGNOSIS — I340 Nonrheumatic mitral (valve) insufficiency: Secondary | ICD-10-CM | POA: Diagnosis not present

## 2023-11-23 DIAGNOSIS — Z87891 Personal history of nicotine dependence: Secondary | ICD-10-CM | POA: Diagnosis not present

## 2023-11-23 DIAGNOSIS — E1151 Type 2 diabetes mellitus with diabetic peripheral angiopathy without gangrene: Secondary | ICD-10-CM | POA: Diagnosis not present

## 2023-11-23 DIAGNOSIS — N179 Acute kidney failure, unspecified: Secondary | ICD-10-CM | POA: Diagnosis not present

## 2023-11-24 ENCOUNTER — Ambulatory Visit (INDEPENDENT_AMBULATORY_CARE_PROVIDER_SITE_OTHER): Admitting: Family Medicine

## 2023-11-24 ENCOUNTER — Encounter: Payer: Self-pay | Admitting: Family Medicine

## 2023-11-24 VITALS — BP 118/50 | HR 66 | Temp 97.9°F | Ht 71.0 in | Wt 160.2 lb

## 2023-11-24 DIAGNOSIS — N179 Acute kidney failure, unspecified: Secondary | ICD-10-CM | POA: Diagnosis not present

## 2023-11-24 DIAGNOSIS — N189 Chronic kidney disease, unspecified: Secondary | ICD-10-CM

## 2023-11-24 DIAGNOSIS — Z8673 Personal history of transient ischemic attack (TIA), and cerebral infarction without residual deficits: Secondary | ICD-10-CM

## 2023-11-24 DIAGNOSIS — I6930 Unspecified sequelae of cerebral infarction: Secondary | ICD-10-CM

## 2023-11-24 DIAGNOSIS — G255 Other chorea: Secondary | ICD-10-CM | POA: Diagnosis not present

## 2023-11-24 LAB — BASIC METABOLIC PANEL WITH GFR
BUN: 19 mg/dL (ref 6–23)
CO2: 28 meq/L (ref 19–32)
Calcium: 8.8 mg/dL (ref 8.4–10.5)
Chloride: 106 meq/L (ref 96–112)
Creatinine, Ser: 1.34 mg/dL (ref 0.40–1.50)
GFR: 50.95 mL/min — ABNORMAL LOW (ref >60.00)
Glucose, Bld: 177 mg/dL — ABNORMAL HIGH (ref 70–99)
Potassium: 4.7 meq/L (ref 3.5–5.1)
Sodium: 143 meq/L (ref 135–145)

## 2023-11-24 LAB — HEPATIC FUNCTION PANEL
ALT: 18 U/L (ref 0–53)
AST: 19 U/L (ref 0–37)
Albumin: 4.1 g/dL (ref 3.5–5.2)
Alkaline Phosphatase: 70 U/L (ref 39–117)
Bilirubin, Direct: 0.2 mg/dL (ref 0.0–0.3)
Total Bilirubin: 0.4 mg/dL (ref 0.2–1.2)
Total Protein: 6.9 g/dL (ref 6.0–8.3)

## 2023-11-24 LAB — CBC WITH DIFFERENTIAL/PLATELET
Basophils Absolute: 0.1 K/uL (ref 0.0–0.1)
Basophils Relative: 0.8 % (ref 0.0–3.0)
Eosinophils Absolute: 0.3 K/uL (ref 0.0–0.7)
Eosinophils Relative: 4.8 % (ref 0.0–5.0)
HCT: 45.2 % (ref 39.0–52.0)
Hemoglobin: 15.2 g/dL (ref 13.0–17.0)
Lymphocytes Relative: 20.8 % (ref 12.0–46.0)
Lymphs Abs: 1.3 K/uL (ref 0.7–4.0)
MCHC: 33.5 g/dL (ref 30.0–36.0)
MCV: 96.6 fl (ref 78.0–100.0)
Monocytes Absolute: 0.5 K/uL (ref 0.1–1.0)
Monocytes Relative: 7.9 % (ref 3.0–12.0)
Neutro Abs: 4.3 K/uL (ref 1.4–7.7)
Neutrophils Relative %: 65.7 % (ref 43.0–77.0)
Platelets: 276 K/uL (ref 150.0–400.0)
RBC: 4.68 Mil/uL (ref 4.22–5.81)
RDW: 14.3 % (ref 11.5–15.5)
WBC: 6.5 K/uL (ref 4.0–10.5)

## 2023-11-24 LAB — TSH: TSH: 1.24 u[IU]/mL (ref 0.35–5.50)

## 2023-11-24 NOTE — Patient Instructions (Addendum)
 Follow up in 6 weeks to recheck blood pressure We'll notify you of your lab results and make any changes if needed Continue the Amlodipine  and Losartan  daily Continue the Lovaza and Rosuvastatin  for cholesterol Try and set a daily schedule to do your physical therapy exercises Call with any questions or concerns Stay Safe!  Stay Healthy! Happy Thanksgiving!

## 2023-11-24 NOTE — Discharge Summary (Signed)
 Physician Discharge Summary  Patient ID: John Woodward. MRN: [id number] DOB/AGE: 78-Nov-1947 78 y.o.  Admit date: 11/05/2023 Discharge date: 11/16/2023  Discharge Diagnoses:  Principal Problem:   ICH (intracerebral hemorrhage) (HCC) Active Problems:   Essential hypertension   Paroxysmal atrial fibrillation (HCC)   Well controlled diabetes mellitus (HCC)   Memory deficit   Chronic anticoagulation   Acute kidney injury superimposed on chronic kidney disease   Bilateral carotid artery stenosis   Discharged Condition: stable  Significant Diagnostic Studies: DG CHEST PORT 1 VIEW Result Date: 11/15/2023 CLINICAL DATA:  Cough.  Ex-smoker. EXAM: PORTABLE CHEST 1 VIEW COMPARISON:  06/29/2022 and chest CTA dated 06/29/2022. FINDINGS: Stable normal-sized heart. Tortuous and partially calcified thoracic aorta. The lungs remain clear and hyperexpanded. Mild peribronchial thickening with improvement. Unremarkable bones. IMPRESSION: 1. Mild bronchitic changes with improvement. 2. Stable changes of COPD. Electronically Signed   By: Elspeth Bathe M.D.   On: 11/15/2023 14:38       Labs:  Basic Metabolic Panel:    Latest Ref Rng & Units 11/15/2023    9:53 AM 11/11/2023    6:05 AM  BMP  Glucose 70 - 99 mg/dL 841  890   BUN 6 - 23 mg/dL 20  24   Creatinine 9.59 - 1.50 mg/dL 8.48  8.70   Sodium 864 - 145 mEq/L 142  141   Potassium 3.5 - 5.1 mEq/L 4.6  4.0   Chloride 96 - 112 mEq/L 106  109   CO2 19 - 32 mEq/L 24  24   Calcium  8.4 - 10.5 mg/dL 9.1  8.8      CBC:    Latest Ref Rng & Units 11/15/2023    9:53 AM 11/11/2023    6:05 AM  CBC  WBC 4.0 - 10.5 K/uL 5.6  4.1   Hemoglobin 13.0 - 17.0 g/dL 84.8  85.5   Hematocrit 39.0 - 52.0 % 46.2  42.0   Platelets 150.0 - 400.0 K/uL 248  225      CBG: No results for input(s): GLUCAP in the last 168 hours.  Brief HPI:   John Patty. is a 78 y.o. male with history of T2DM, MVR, CKD 3b, COPD, AAA/B-CAS, PAD, PAF-on coumadin ,  TIA, memory loss who was admitted on 11/01/23 with reports of fall the day prior to admission with reports of increased weakness, balance problems, facial droop and slurred speech.  INR was 3.4 at admission and CT head done revealing 1.8 cm right basal ganglia hemorrhage.  Coumadin  was removed and Cleviprex was used to keep BP in 130-150 range.  Patient was also noted to have right-sided weakness and Dr. Rosemarie felt that IPH hypertensive and in setting of warfarin.    Repeat CT 10/08 showed stable right thalamic/posterior internal capsule hemorrhage.  Neurology discussed participation in aspire study versus ASA with patient and family but would need to wait at least 2 weeks from hemorrhage.  PT/OT was consulted and patient was noted to be have balance deficits with staggering gait and was requiring assistance with ADLs and mobility.  CIR was recommended due to functional decline.   Hospital Course: John Beedle. was admitted to rehab 11/05/2023 for inpatient therapies to consist of PT, ST and OT at least three hours five days a week. Past admission physiatrist, therapy team and rehab RN have worked together to provide customized collaborative inpatient rehab.  Subcu Lovenox  was used for DVT prophylaxis during his stay.    His blood pressures  were monitored on TID basis and was noted to be trending upward therefore amlodipine  was resumed and losartan  added for better control.  Renal status has been monitored and is at baseline at 1.5.  Mild bradycardia noted and EKG done showed no evidence of AV block.  EKG ordered due to reports of cough and chest x-ray showed no acute abnormality.  He reported issues with chronic left lateral ankle pain and Voltaren gel was added with improvement in symptoms.  He has had issues with sleep-wake disruption and trazodone ordered for use on as needed basis.  He has had issues with restlessness and wife reported progressive cognitive deficits since 2023 and he was started on  dons a pill with some improvement. Hemoglobin A1c noted to be at 6.4 and controlled on diet alone.  Fasting blood sugars have been within normal limits.  Patient has been neurologically stable and has been making progress during Patient and wife elected to participate in aspire trial.  Dr. Rosemarie was consulted and discussed protocol with patient and wife with medications provided at discharge. He has made gains during his stay and requires supervision at discharge. He will continue to receive follow up HHPT and HHOT by [facility] after discharge.    Rehab course: During patient's stay in rehab weekly team conferences were held to monitor patient's progress, set goals and discuss barriers to discharge. At admission, patient required min assist with mobility and with ADL tasks. Patient noted to have moderate cognitive deficits which was felt to be at baseline and no ST needed during his stay. He  has had improvement in activity tolerance, balance, postural control as well as ability to compensate for deficits. He/She has had improvement in functional use LUE  and  LLE as well as improvement in awareness. He is able to complete ADL tasks with supervision. He is able to perform transfers with supervision and able to ambulate 150' with use of RW.  Family education has been completed.    Disposition:  Hoe with home health.  Diet: Heart healthy   Discharge Instructions     Ambulatory referral to Neurology   Complete by: As directed    An appointment is requested in approximately: 4-6 weeks   Ambulatory referral to Physical Medicine Rehab   Complete by: As directed    Post hospital follow up      Allergies as of 11/16/2023       Reactions   Antibacterial Hand Soap [triclosan] Other (See Comments)   Caused water blisters        Medication List     STOP taking these medications    warfarin 5 MG tablet Commonly known as: COUMADIN        TAKE these medications     acetaminophen  325 MG tablet Commonly known as: TYLENOL  Take 1-2 tablets (325-650 mg total) by mouth every 4 (four) hours as needed for mild pain (pain score 1-3).   amLODipine  10 MG tablet Commonly known as: NORVASC  TAKE 1 TABLET BY MOUTH EVERY DAY   apixaban  or placebo 5 mg Tabs tablet Take 1 tablet (5 mg total) by mouth 2 (two) times daily.   aspirin  or placebo 81 mg Tabs tablet Take 1 tablet (81 mg total) by mouth daily.   diclofenac Sodium 1 % Gel Commonly known as: VOLTAREN Apply 2 g topically 4 (four) times daily. To left foot and ankle   donepezil 5 MG tablet Commonly known as: ARICEPT Take 1 tablet (5 mg total) by mouth  at bedtime.   escitalopram  20 MG tablet Commonly known as: LEXAPRO  Take 1 tablet (20 mg total) by mouth daily.   gabapentin 100 MG capsule Commonly known as: NEURONTIN Take 1 capsule (100 mg total) by mouth at bedtime.   losartan  50 MG tablet Commonly known as: COZAAR  TAKE 1 TABLET BY MOUTH EVERY DAY   nicotine  polacrilex 2 MG lozenge Commonly known as: COMMIT Take 2 mg by mouth 4 (four) times daily as needed for smoking cessation.   omega-3 acid ethyl esters 1 g capsule Commonly known as: LOVAZA Take 1 capsule (1 g total) by mouth 2 (two) times daily.   rosuvastatin  40 MG tablet Commonly known as: CRESTOR  TAKE 1 TABLET BY MOUTH EVERY DAY   Stool Softener/Laxative 50-8.6 MG tablet Generic drug: senna-docusate Take 2 tablets by mouth daily at 6 (six) AM.        Follow-up Information     Mahlon Comer BRAVO, MD Follow up.   Specialty: Family Medicine Why: Call in 1-2 days for post hospital follow up Contact information: [address] [place] [address] [place] [address] [phone]         Emeline Search C, DO Follow up.   Specialty: Physical Medicine and Rehabilitation Why: office will call you with follow up appointment Contact information: [address] [place] [address] [phone]         [facility] Follow up.   Why: office will call you with follow up appointment Contact information: [address]     [address] [place]  [address] [phone]        Serene Gaile ORN, MD Follow up on 11/29/2023.   Specialties: Vascular Surgery, Cardiology Why: be there at 9:05 am Contact information: [address] [place] [address] [phone]                 Signed: Sharlet GORMAN Schmitz 11/24/2023, 9:45 PM

## 2023-11-24 NOTE — Progress Notes (Unsigned)
   Subjective:    Patient ID: John Woodward., male    DOB: 03-16-1945, 78 y.o.   MRN: [id number]  HPI Hospital f/u- pt fell on 10/5 around 2 pm.  The next day developed dizziness, L sided facial weakness, and slurred speech.  CT showed 1.8cm R intraparenchymal hemorrhage in the posterior limb of internal capsule.  On exam had L sided upper and lower extremity weakness.  Home coumadin  was held.  ECHO normal.  Was d/c'd to inpt rehab on 10/10.  Discussions regarding anticoagulation b/c he is not a good candidate for long term coumadin  due to acute intracerebral hemorrhage as well as micro-hemorrhages on MRI.  Not able to afford Eliquis .  Was considering Watchman vs ASPIRE stroke prevention study.  Was d/c'd from inpt rehab on 10/21.  Plan was to f/u w/ [facility] PM&R and Neuro.  Dr Rosemarie has enrolled pt in ASPIRE trial.  It was determined in his d/c summary that he is not capable of making his own decisions.    Baseline Cr 1.5, Triglycerides were elevated and Lovaza was added to his Crestor .  Was continued on his Amlodipine  and Losartan  at d/c.  Today pt reports feeling 'a lot better than I did'.  Is now doing [facility] PT for L sided weakness.  Able to ambulate using a walker.  Wife reports 'this L side has a mind of its own'   Review of Systems For ROS see HPI     Objective:   Physical Exam Vitals reviewed.  Constitutional:      General: He is not in acute distress.    Appearance: He is not ill-appearing.  Eyes:     Extraocular Movements: Extraocular movements intact.     Conjunctiva/sclera: Conjunctivae normal.  Cardiovascular:     Rate and Rhythm: Normal rate and regular rhythm.     Pulses: Normal pulses.  Pulmonary:     Effort: Pulmonary effort is normal. No respiratory distress.     Breath sounds: No wheezing or rhonchi.  Musculoskeletal:     Right lower leg: No edema.     Left lower leg: No edema.  Skin:    General: Skin is warm and dry.  Neurological:     Mental Status: He is  alert.     Comments: L hemiballism  Psychiatric:        Mood and Affect: Mood normal.        Behavior: Behavior normal.        Thought Content: Thought content normal.           Assessment & Plan:

## 2023-11-25 ENCOUNTER — Ambulatory Visit: Admitting: Family Medicine

## 2023-11-25 ENCOUNTER — Ambulatory Visit: Payer: Self-pay | Admitting: Family Medicine

## 2023-11-25 DIAGNOSIS — Z8616 Personal history of COVID-19: Secondary | ICD-10-CM | POA: Diagnosis not present

## 2023-11-25 DIAGNOSIS — R001 Bradycardia, unspecified: Secondary | ICD-10-CM | POA: Diagnosis not present

## 2023-11-25 DIAGNOSIS — N179 Acute kidney failure, unspecified: Secondary | ICD-10-CM | POA: Diagnosis not present

## 2023-11-25 DIAGNOSIS — I69122 Dysarthria following nontraumatic intracerebral hemorrhage: Secondary | ICD-10-CM | POA: Diagnosis not present

## 2023-11-25 DIAGNOSIS — Z87891 Personal history of nicotine dependence: Secondary | ICD-10-CM | POA: Diagnosis not present

## 2023-11-25 DIAGNOSIS — E785 Hyperlipidemia, unspecified: Secondary | ICD-10-CM | POA: Diagnosis not present

## 2023-11-25 DIAGNOSIS — I2510 Atherosclerotic heart disease of native coronary artery without angina pectoris: Secondary | ICD-10-CM | POA: Diagnosis not present

## 2023-11-25 DIAGNOSIS — Z7982 Long term (current) use of aspirin: Secondary | ICD-10-CM | POA: Diagnosis not present

## 2023-11-25 DIAGNOSIS — E1122 Type 2 diabetes mellitus with diabetic chronic kidney disease: Secondary | ICD-10-CM | POA: Diagnosis not present

## 2023-11-25 DIAGNOSIS — I340 Nonrheumatic mitral (valve) insufficiency: Secondary | ICD-10-CM | POA: Diagnosis not present

## 2023-11-25 DIAGNOSIS — I714 Abdominal aortic aneurysm, without rupture, unspecified: Secondary | ICD-10-CM | POA: Diagnosis not present

## 2023-11-25 DIAGNOSIS — F339 Major depressive disorder, recurrent, unspecified: Secondary | ICD-10-CM | POA: Diagnosis not present

## 2023-11-25 DIAGNOSIS — G936 Cerebral edema: Secondary | ICD-10-CM | POA: Diagnosis not present

## 2023-11-25 DIAGNOSIS — N1832 Chronic kidney disease, stage 3b: Secondary | ICD-10-CM | POA: Diagnosis not present

## 2023-11-25 DIAGNOSIS — E1169 Type 2 diabetes mellitus with other specified complication: Secondary | ICD-10-CM | POA: Diagnosis not present

## 2023-11-25 DIAGNOSIS — I480 Paroxysmal atrial fibrillation: Secondary | ICD-10-CM | POA: Diagnosis not present

## 2023-11-25 DIAGNOSIS — J449 Chronic obstructive pulmonary disease, unspecified: Secondary | ICD-10-CM | POA: Diagnosis not present

## 2023-11-25 DIAGNOSIS — J439 Emphysema, unspecified: Secondary | ICD-10-CM | POA: Diagnosis not present

## 2023-11-25 DIAGNOSIS — K5900 Constipation, unspecified: Secondary | ICD-10-CM | POA: Diagnosis not present

## 2023-11-25 DIAGNOSIS — E1151 Type 2 diabetes mellitus with diabetic peripheral angiopathy without gangrene: Secondary | ICD-10-CM | POA: Diagnosis not present

## 2023-11-25 DIAGNOSIS — Z7901 Long term (current) use of anticoagulants: Secondary | ICD-10-CM | POA: Diagnosis not present

## 2023-11-25 DIAGNOSIS — I9589 Other hypotension: Secondary | ICD-10-CM | POA: Diagnosis not present

## 2023-11-25 DIAGNOSIS — I129 Hypertensive chronic kidney disease with stage 1 through stage 4 chronic kidney disease, or unspecified chronic kidney disease: Secondary | ICD-10-CM | POA: Diagnosis not present

## 2023-11-25 DIAGNOSIS — I69192 Facial weakness following nontraumatic intracerebral hemorrhage: Secondary | ICD-10-CM | POA: Diagnosis not present

## 2023-11-25 DIAGNOSIS — G3184 Mild cognitive impairment, so stated: Secondary | ICD-10-CM | POA: Diagnosis not present

## 2023-11-25 NOTE — Progress Notes (Signed)
Pt's wife has been notified.   

## 2023-11-25 NOTE — Assessment & Plan Note (Signed)
 New.  Pt's Cr was elevated during hospitalization.  Cr was 1.5 at time of d/c.  Check labs to ensure stability

## 2023-11-25 NOTE — Assessment & Plan Note (Signed)
 New.  Reviewed H&P, consults, d/c summary, Rehab admission and consult notes, imaging, labs.  Since he is no longer a candidate for anticoagulation w/ coumadin , is now enrolled in ASPIRE trial.  Has Neuro f/u.  Working w/ PT.  Now able to ambulate using a walker.  Unfortunately dealing w/ L sided hemiballism.  Will follow.

## 2023-11-26 DIAGNOSIS — Z7982 Long term (current) use of aspirin: Secondary | ICD-10-CM | POA: Diagnosis not present

## 2023-11-26 DIAGNOSIS — G936 Cerebral edema: Secondary | ICD-10-CM | POA: Diagnosis not present

## 2023-11-26 DIAGNOSIS — Z7901 Long term (current) use of anticoagulants: Secondary | ICD-10-CM | POA: Diagnosis not present

## 2023-11-26 DIAGNOSIS — I9589 Other hypotension: Secondary | ICD-10-CM | POA: Diagnosis not present

## 2023-11-26 DIAGNOSIS — I714 Abdominal aortic aneurysm, without rupture, unspecified: Secondary | ICD-10-CM | POA: Diagnosis not present

## 2023-11-26 DIAGNOSIS — E785 Hyperlipidemia, unspecified: Secondary | ICD-10-CM | POA: Diagnosis not present

## 2023-11-26 DIAGNOSIS — I69192 Facial weakness following nontraumatic intracerebral hemorrhage: Secondary | ICD-10-CM | POA: Diagnosis not present

## 2023-11-26 DIAGNOSIS — E1151 Type 2 diabetes mellitus with diabetic peripheral angiopathy without gangrene: Secondary | ICD-10-CM | POA: Diagnosis not present

## 2023-11-26 DIAGNOSIS — R001 Bradycardia, unspecified: Secondary | ICD-10-CM | POA: Diagnosis not present

## 2023-11-26 DIAGNOSIS — Z87891 Personal history of nicotine dependence: Secondary | ICD-10-CM | POA: Diagnosis not present

## 2023-11-26 DIAGNOSIS — I69122 Dysarthria following nontraumatic intracerebral hemorrhage: Secondary | ICD-10-CM | POA: Diagnosis not present

## 2023-11-26 DIAGNOSIS — I2510 Atherosclerotic heart disease of native coronary artery without angina pectoris: Secondary | ICD-10-CM | POA: Diagnosis not present

## 2023-11-26 DIAGNOSIS — I340 Nonrheumatic mitral (valve) insufficiency: Secondary | ICD-10-CM | POA: Diagnosis not present

## 2023-11-26 DIAGNOSIS — G3184 Mild cognitive impairment, so stated: Secondary | ICD-10-CM | POA: Diagnosis not present

## 2023-11-26 DIAGNOSIS — E1169 Type 2 diabetes mellitus with other specified complication: Secondary | ICD-10-CM | POA: Diagnosis not present

## 2023-11-26 DIAGNOSIS — N1832 Chronic kidney disease, stage 3b: Secondary | ICD-10-CM | POA: Diagnosis not present

## 2023-11-26 DIAGNOSIS — J439 Emphysema, unspecified: Secondary | ICD-10-CM | POA: Diagnosis not present

## 2023-11-26 DIAGNOSIS — J449 Chronic obstructive pulmonary disease, unspecified: Secondary | ICD-10-CM | POA: Diagnosis not present

## 2023-11-26 DIAGNOSIS — Z8616 Personal history of COVID-19: Secondary | ICD-10-CM | POA: Diagnosis not present

## 2023-11-26 DIAGNOSIS — K5900 Constipation, unspecified: Secondary | ICD-10-CM | POA: Diagnosis not present

## 2023-11-26 DIAGNOSIS — F339 Major depressive disorder, recurrent, unspecified: Secondary | ICD-10-CM | POA: Diagnosis not present

## 2023-11-26 DIAGNOSIS — E1122 Type 2 diabetes mellitus with diabetic chronic kidney disease: Secondary | ICD-10-CM | POA: Diagnosis not present

## 2023-11-26 DIAGNOSIS — I129 Hypertensive chronic kidney disease with stage 1 through stage 4 chronic kidney disease, or unspecified chronic kidney disease: Secondary | ICD-10-CM | POA: Diagnosis not present

## 2023-11-26 DIAGNOSIS — I480 Paroxysmal atrial fibrillation: Secondary | ICD-10-CM | POA: Diagnosis not present

## 2023-11-26 DIAGNOSIS — N179 Acute kidney failure, unspecified: Secondary | ICD-10-CM | POA: Diagnosis not present

## 2023-11-29 ENCOUNTER — Ambulatory Visit (HOSPITAL_BASED_OUTPATIENT_CLINIC_OR_DEPARTMENT_OTHER)
Admission: RE | Admit: 2023-11-29 | Discharge: 2023-11-29 | Disposition: A | Source: Ambulatory Visit | Attending: Surgery | Admitting: Surgery

## 2023-11-29 ENCOUNTER — Encounter: Payer: Self-pay | Admitting: Surgery

## 2023-11-29 ENCOUNTER — Ambulatory Visit (HOSPITAL_COMMUNITY)
Admission: RE | Admit: 2023-11-29 | Discharge: 2023-11-29 | Disposition: A | Discharge status: Home / Self Care | Source: Ambulatory Visit | Attending: Surgery | Admitting: Surgery

## 2023-11-29 ENCOUNTER — Ambulatory Visit: Admitting: Surgery

## 2023-11-29 ENCOUNTER — Telehealth: Payer: Self-pay

## 2023-11-29 VITALS — BP 136/73 | HR 62 | Ht 71.0 in | Wt 154.0 lb

## 2023-11-29 DIAGNOSIS — I6523 Occlusion and stenosis of bilateral carotid arteries: Secondary | ICD-10-CM

## 2023-11-29 DIAGNOSIS — I7143 Infrarenal abdominal aortic aneurysm, without rupture: Secondary | ICD-10-CM | POA: Diagnosis not present

## 2023-11-29 NOTE — Progress Notes (Signed)
 [facility]  Patient name: John Woodward. MRN: [id number] DOB: [birth year] Sex: male   REASON FOR VISIT:    Follow up  HISOTRY OF PRESENT ILLNESS:    John Woodyard. is a 78 y.o. male returns today for follow-up.  I have been following his carotid disease.  He also has lower extremity peripheral vascular disease with moderate stenosis bilaterally.  He did have an incidentally found abdominal aortic aneurysm measuring 3.2 cm in 2024  The patient was recently in the hospital for intracranial hemorrhage which has left him with spasticity on the left side.  The hemorrhage was secondary to anticoagulation and hypertension.  During his workup a CT angiogram showed 80% right carotid stenosis and 76% left.  The patient was discharged home on the ASPIRE trial.  He is getting PT and OT at home  The patient is a diabetic with an A1c in the 6 range. He is a current smoker. He is medically managed for hypertension with an ARB. He takes a statin for hypercholesterolemia. He is on Eliquis  for AFIB  PAST MEDICAL HISTORY:   Past Medical History:  Diagnosis Date   Arthritis    Carotid artery disease    Closed fracture dislocation of right elbow    30 years ago -- fell out of theback of a truck   Diabetes mellitus without complication (HCC)    Diverticulosis    Hemorrhoids    Hx of colonic polyps 02/23/2017   Hyperlipidemia    Hypertension    Diagnosed 10 years ago. No on medication. Denies history of CAD.    Memory loss    PAD (peripheral artery disease)      FAMILY HISTORY:   Family History  Problem Relation Age of Onset   Hypertension Mother    Dementia Mother        28s   Transient ischemic attack Mother        70s   Kidney disease Father    COPD Father    Hypertension Brother    Lung cancer Maternal Aunt    Alcohol abuse Maternal Grandfather    Mental illness Paternal Grandmother    Hypertension Daughter     Healthy Son    Learning disabilities Son    Colon cancer Neg Hx    Colon polyps Neg Hx    Esophageal cancer Neg Hx    Rectal cancer Neg Hx    Stomach cancer Neg Hx     SOCIAL HISTORY:   Social History   Tobacco Use   Smoking status: Former    Types: Cigarettes    Start date: 06/27/2022    Quit date: 1968    Years since quitting: 57.8   Smokeless tobacco: Never   Tobacco comments:    Taking Nicotine  lozenges  Substance Use Topics   Alcohol use: No     ALLERGIES:   Allergies  Allergen Reactions   Antibacterial Hand Soap [Triclosan] Other (See Comments)    Caused water blisters     CURRENT MEDICATIONS:   Current Outpatient Medications  Medication Sig Dispense Refill   acetaminophen  (TYLENOL ) 325 MG tablet Take 1-2 tablets (325-650 mg total) by mouth every 4 (four) hours as needed for mild pain (pain score 1-3).     amLODipine  (NORVASC ) 10 MG tablet TAKE 1 TABLET BY MOUTH EVERY DAY 90 tablet 1   diclofenac Sodium (VOLTAREN) 1 % GEL Apply 2 g topically 4 (four) times daily. To left foot  and ankle 300 g 0   donepezil (ARICEPT) 5 MG tablet Take 1 tablet (5 mg total) by mouth at bedtime. 30 tablet 0   escitalopram  (LEXAPRO ) 20 MG tablet Take 1 tablet (20 mg total) by mouth daily. 90 tablet 3   gabapentin (NEURONTIN) 100 MG capsule Take 1 capsule (100 mg total) by mouth at bedtime. 30 capsule 0   losartan  (COZAAR ) 50 MG tablet TAKE 1 TABLET BY MOUTH EVERY DAY 90 tablet 1   nicotine  polacrilex (COMMIT) 2 MG lozenge Take 2 mg by mouth 4 (four) times daily as needed for smoking cessation.     omega-3 acid ethyl esters (LOVAZA) 1 g capsule Take 1 capsule (1 g total) by mouth 2 (two) times daily. 60 capsule 0   rosuvastatin  (CRESTOR ) 40 MG tablet TAKE 1 TABLET BY MOUTH EVERY DAY 90 tablet 2   senna-docusate (SENOKOT-S) 8.6-50 MG tablet Take 2 tablets by mouth daily at 6 (six) AM. 60 tablet 0   STUDY - ASPIRE - apixaban  5 mg or placebo tablet (PI-Sethi) Take 1 tablet (5 mg  total) by mouth 2 (two) times daily. 60 tablet 0   STUDY - ASPIRE - aspirin  81 mg or placebo tablet (PI-Sethi) Take 1 tablet (81 mg total) by mouth daily. 90 tablet 0   No current facility-administered medications for this visit.    REVIEW OF SYSTEMS:   [X]  denotes positive finding, [ ]  denotes negative finding Cardiac  Comments:  Chest pain or chest pressure:    Shortness of breath upon exertion:    Short of breath when lying flat:    Irregular heart rhythm:        Vascular    Pain in calf, thigh, or hip brought on by ambulation:    Pain in feet at night that wakes you up from your sleep:     Blood clot in your veins:    Leg swelling:         Pulmonary    Oxygen at home:    Productive cough:     Wheezing:         Neurologic    Sudden weakness in arms or legs:  x   Sudden numbness in arms or legs:  x   Sudden onset of difficulty speaking or slurred speech:    Temporary loss of vision in one eye:     Problems with dizziness:         Gastrointestinal    Blood in stool:     Vomited blood:         Genitourinary    Burning when urinating:     Blood in urine:        Psychiatric    Major depression:         Hematologic    Bleeding problems:    Problems with blood clotting too easily:        Skin    Rashes or ulcers:        Constitutional    Fever or chills:      PHYSICAL EXAM:   Vitals:   11/29/23 1023  BP: 136/73  Pulse: 62  Weight: 154 lb (69.9 kg)  Height: 5' 11 (1.803 m)    GENERAL: The patient is a well-nourished male, in no acute distress. The vital signs are documented above. CARDIAC: There is a regular rate and rhythm.  PULMONARY: Non-labored respirations ABDOMEN: Soft and non-tender with normal pitched bowel sounds.  MUSCULOSKELETAL: There are no major deformities or cyanosis.  NEUROLOGIC: Left-sided spasticity. SKIN: There are no ulcers or rashes noted. PSYCHIATRIC: The patient has a normal affect.  STUDIES:   I have reviewed the  following: Carotid: Right Carotid: Velocities in the right ICA are consistent with a 60-79%                 stenosis. Non-hemodynamically significant plaque <50% noted  in                the CCA.   Left Carotid: Velocities in the left ICA are consistent with a 60-79%  stenosis.   Vertebrals: Bilateral vertebral arteries demonstrate antegrade flow.  Subclavians: Normal flow hemodynamics were seen in bilateral subclavian               arteries.     AAA:   MEDICAL ISSUES:   AAA:  Maximum diameter is 3.1 cm.  H e will need follow-up in 1 year with an ALT on  Carotid: Ultrasound today shows bilateral 60 to 79% stenosis.  CT scan from the hospital shows 80% right and 76% left sided stenosis.  The patient has recently had an acute intracranial bleed felt to be related to anticoagulation hypertension, not his carotid disease.  I discussed the carotid imaging findings with him.  I do not think that he is an appropriate candidate for intervention at this time because of his intracranial hemorrhage.  Stenting would probably not be a good idea given the need to add aspirin  and Plavix.  We will most likely need to consider endarterectomy.  I am going to have him return in 6 months for repeat ultrasound and further discussions regarding surgical intervention.    Malvina Serene CLORE, MD, FACS [facility] [phone] Pager [phone]

## 2023-11-29 NOTE — Telephone Encounter (Signed)
 Patient would like a call back in the afternoon.  I will call later today to get clarification   Copied from CRM [id number]. Topic: General - Other >> Nov 29, 2023  8:19 AM Mesmerise C wrote: Reason for CRM: Patient's wife Virginia  wanted to tell Dr. Mahlon since forgot to express at his appt, feels like patient was not seen by a professional at coumadin  clinic states his appt was pushed out too far  in advance once they got his numbers where they should be which she believes led to his stroke, spoke to someone else there stated it was a manufacturing systems engineer and not a nurse she can be reached at [phone], best reached in the afternoon due to appts

## 2023-11-29 NOTE — Telephone Encounter (Signed)
 LVM for return call.

## 2023-11-29 NOTE — Telephone Encounter (Signed)
 Spoke with wife. She just wanted to make Dr.Tabori aware of her thoughts. Pt was told provider is out of the office until 12/08/2023

## 2023-11-30 ENCOUNTER — Encounter: Payer: Self-pay | Admitting: Registered Nurse

## 2023-11-30 ENCOUNTER — Telehealth: Payer: Self-pay | Admitting: Family Medicine

## 2023-11-30 ENCOUNTER — Encounter: Attending: Registered Nurse | Admitting: Registered Nurse

## 2023-11-30 VITALS — BP 140/82 | HR 67 | Ht 71.0 in | Wt 154.2 lb

## 2023-11-30 DIAGNOSIS — R413 Other amnesia: Secondary | ICD-10-CM | POA: Diagnosis not present

## 2023-11-30 DIAGNOSIS — I10 Essential (primary) hypertension: Secondary | ICD-10-CM | POA: Insufficient documentation

## 2023-11-30 DIAGNOSIS — I480 Paroxysmal atrial fibrillation: Secondary | ICD-10-CM | POA: Insufficient documentation

## 2023-11-30 NOTE — Progress Notes (Signed)
 Subjective:    Patient ID: John Woodward., male    DOB: 01-Apr-1945, 78 y.o.   MRN: [id number]  HPI: John Woodward. is a 78 y.o. male who is here for HFU appointment for F/U of his  ICH ( Intracerebal hemorrhage), Paroxysmal Atrial Fibrillation , Essential Hypertension and Memory deficit.   Dr. Rosemarie: Discharge Summary Note:  HISTORY OF PRESENT ILLNESS 78 y.o. patient with history of HTN, HLD, Afib on Coumadin , CAD, PAD, memory loss, and former tobacco use presenting with unstable gait, found to have right basal ganglia intraparenchymal hemorrhage likely related to warfarin anticoagulation for chronic A-fib and possibly an uncontrolled hypertension. Patient does have a history of chronic lacunar infarcts dating back to 2020 as seen on previous MRI. NIH on Admission 4.    HOSPITAL COURSE Patient admitted with c/o fall on 10/31/2023 around 2 PM. At the time of fall, patient denied faintness, dizziness, head injury or LOC, however began to feel dizzy and off balance the following day. He also had some left-sided facial weakness and mildly slurred speech. CT Head showed 1.8 cm, right intraparenchymal hemorrhage centered in the posterior limb of the internal capsule. MRI showed right basal ganglia intraparenchymal hemorrhage, and multiple chronic hemorrhages in the pons, cerebellum, thalami, and supratentorial white matter. On exam, he had mild left-sided upper and lower extremity weakness. He was admitted to the Neuro ICU for observation.   Patient's home warfarin, amlodipine , and losartan  were held during admission. He was continued on home rosuvastatin . His initial INR was 3.2, it was reversed with Kcentra, and last measured at 1.1. His blood pressure was adequately controlled. Repeat CT on 10/8 showed stable right thalamic/posterior limb internal capsule hemorrhage without significant mass effect. Normal echo. Patient was transferred from the ICU to progressive unit on 10/8. At time of  discharge, patient demonstrated improvement in LUE and LLE strength. He was discharged to inpatient rehab at [facility].   Although, patient was on anticoagulation for his A-fib to prevent thromboembolism and strokes, given his intracerebral hemorrhage as well as cerebral microhemorrhages on MRI he is not a good long-term anticoagulation candidate. Discussed treatment options with the patient and his wife including switching to Eliquis  which she cannot afford, considering Watchman device or participating in ASPIRE stroke prevention study(Eliquis  versus aspirin  in patients with intracerebral hemorrhage and A-fib).   CT Head: WO Contrast IMPRESSION: Acute, at least 1.8 cm, right intraparenchymal hemorrhage centered in the posterior limb of the internal capsule.   MR: Brain: WO Contrast: MPRESSION: 1. Unchanged right basal ganglia intraparenchymal hemorrhage. 2. Multiple chronic microhemorrhages in the pons, cerebellum, thalami, and supratentorial white matter. 3. Early confluent chronic small vessel disease.   CTA:  IMPRESSION: 1. Positive for advanced atherosclerosis and evidence of small intracranial aneurysms (see #3). Negative for large vessel occlusion or CTA spot sign associated with small acute intra-axial hemorrhage 2. High grade Bilateral ICA origin stenosis: 80 percent stenosis on the right, 76 percent stenosis on the left. 3. 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   John Woodward was admitted to inpatient rehabilitation on 11/05/2023 and discharged home on 11/16/2023. He is receiving Home Health Therapy with [facility]. He denies any pain at this time. He rated his pain 4 on Health and History Form. [name] reports his appetite is fair, reports is the same as pre-hospitalization.   Wife reports her husband didn't have a fall, but a  stumble, she is aware this will be documented at her request. She  verbalizes understanding.    Pain Inventory Average Pain 4 Pain Right Now 4 My pain is no answer  LOCATION OF PAIN  left ankle pain (old injury)  BOWEL Number of stools per week: 14 Oral laxative use No  Type of laxative No Enema or suppository use No  History of colostomy No  Incontinent No   BLADDER Normal In and out cath, frequency No Able to self cath No  Bladder incontinence No  Frequent urination No  Leakage with coughing No  Difficulty starting stream No  Incomplete bladder emptying No    Mobility use a walker how many minutes can you walk? 1-2 hours a day ability to climb steps?  yes do you drive?  no  Function retired  Neuro/Psych weakness trouble walking spasms  Prior Studies Any changes since last visit?  yes  Physicians involved in your care Primary care Comer Greet, MD Neurologist Eather Popp, MD   Family History  Problem Relation Age of Onset   Hypertension Mother    Dementia Mother        71s   Transient ischemic attack Mother        33s   Kidney disease Father    COPD Father    Hypertension Brother    Lung cancer Maternal Aunt    Alcohol abuse Maternal Grandfather    Mental illness Paternal Grandmother    Hypertension Daughter    Healthy Son    Learning disabilities Son    Colon cancer Neg Hx    Colon polyps Neg Hx    Esophageal cancer Neg Hx    Rectal cancer Neg Hx    Stomach cancer Neg Hx    Social History   Socioeconomic History   Marital status: Married    Spouse name: Ginger   Number of children: 3   Years of education: Not on file   Highest education level: 12th grade  Occupational History   Occupation: Retired    Comment: Curator  Tobacco Use   Smoking status: Former    Types: Cigarettes    Start date: 06/27/2022    Quit date: Dec 17, 1966    Years since quitting: 12/17/2055   Smokeless tobacco: Never   Tobacco comments:    Taking Nicotine  lozenges  Vaping Use   Vaping status: Never Used  Substance and Sexual  Activity   Alcohol use: No   Drug use: No   Sexual activity: Yes  Other Topics Concern   Not on file  Social History Narrative   Social Review      Patient lives at home with. Spouse Riven Mabile   Pt lives in a one or two story home? One story home   Does patient lives in a facility? If so where? Private home   What is patient highest level of education? 12 grade   Pt is RIGHT handed            Daughter died in Dec 16, 2021  Social Drivers of Health   Financial Resource Strain: Low Risk  (09/14/2023)   Overall Financial Resource Strain (CARDIA)    Difficulty of Paying Living Expenses: Not hard at all  Food Insecurity: No Food Insecurity (11/02/2023)   Hunger Vital Sign    Worried About Running Out of Food in the Last Year: Never true    Ran Out of Food in the Last Year: Never true  Transportation Needs: No Transportation Needs (  11/02/2023)   PRAPARE - Administrator, Civil Service (Medical): No    Lack of Transportation (Non-Medical): No  Physical Activity: Inactive (09/14/2023)   Exercise Vital Sign    Days of Exercise per Week: 0 days    Minutes of Exercise per Session: 0 min  Stress: No Stress Concern Present (09/14/2023)   [employer] of Occupational Health - Occupational Stress Questionnaire    Feeling of Stress: Not at all  Social Connections: Moderately Isolated (11/02/2023)   Social Connection and Isolation Panel    Frequency of Communication with Friends and Family: Twice a week    Frequency of Social Gatherings with Friends and Family: Once a week    Attends Religious Services: Never    Database Administrator or Organizations: No    Attends Banker Meetings: Never    Marital Status: Married   Past Surgical History:  Procedure Laterality Date   OLECRANON BURSECTOMY Right 08/18/2016   Procedure: OLECRANON BURSECTOMY;  Surgeon: Addie Glendia Hacker, MD;  Location: [facility];  Service: Orthopedics;  Laterality: Right;   Past Medical  History:  Diagnosis Date   Arthritis    Carotid artery disease    Closed fracture dislocation of right elbow    30 years ago -- fell out of theback of a truck   Diabetes mellitus without complication (HCC)    Diverticulosis    Hemorrhoids    Hx of colonic polyps 02/23/2017   Hyperlipidemia    Hypertension    Diagnosed 10 years ago. No on medication. Denies history of CAD.    Memory loss    PAD (peripheral artery disease)    BP (!) 140/82 (BP Location: Right Arm, Patient Position: Sitting, Cuff Size: Normal)   Pulse 67   Ht 5' 11 (1.803 m)   Wt 154 lb 3.2 oz (69.9 kg)   SpO2 96%   BMI 21.51 kg/m   Opioid Risk Score:   Fall Risk Score:  `1  Depression screen [facility] 2/9     11/30/2023   10:45 AM 09/14/2023    3:17 PM 05/26/2023   10:45 AM 11/24/2022   10:16 AM 08/10/2022    9:42 AM 04/16/2022   11:37 AM 01/08/2022   12:46 PM  Depression screen PHQ 2/9  Decreased Interest 0 0 0 1 1 1  0  Down, Depressed, Hopeless 0 0 0 1 1 1 2   PHQ - 2 Score 0 0 0 2 2 2 2   Altered sleeping 0 0 0 1 0 1 1  Tired, decreased energy 0 0 0 1 2 0 3  Change in appetite 0 0 0 1 0 0 1  Feeling bad or failure about yourself  0 0 0 0 2 0 0  Trouble concentrating 0 0 0 1 1 0 0  Moving slowly or fidgety/restless 0 0 0 1 0 0 0  Suicidal thoughts 0 0 0 1 0 0 0  PHQ-9 Score 0 0 0 8 7 3 7   Difficult doing work/chores Somewhat difficult  Not difficult at all Not difficult at all Somewhat difficult Not difficult at all Somewhat difficult     Review of Systems  Respiratory:  Positive for cough.   Musculoskeletal:  Positive for arthralgias.       Ankle pain (previous broken ankle)  Neurological:  Positive for weakness.       Spasms, trouble walking, weakness  All other systems reviewed and are negative.      Objective:  Physical Exam Vitals and nursing note reviewed.  Constitutional:      Appearance: Normal appearance.  Cardiovascular:     Rate and Rhythm: Normal rate and regular rhythm.      Pulses: Normal pulses.     Heart sounds: Normal heart sounds.  Pulmonary:     Effort: Pulmonary effort is normal.     Breath sounds: Normal breath sounds.  Musculoskeletal:     Comments: Normal Muscle Bulk and Muscle Testing Reveals:  Upper Extremities: Full ROM and Muscle Strength 5/5 Lower Extremities: Full ROM and Muscle Strength 5/5 Arises from Table slowly using walker for support Narrow Based  Gait     Skin:    General: Skin is warm and dry.  Neurological:     Mental Status: He is alert and oriented to person, place, and time.  Psychiatric:        Mood and Affect: Mood normal.        Behavior: Behavior normal.          Assessment & Plan:  ICH ( Intracerebal hemorrhage): Has a scheduled appointment with Dr Rosemarie: He is currently on Aspire Trial. He is receiving Outpatient Therapy with [facility].  Paroxysmal Atrial Fibrillation : Cardiology Following. Continue to Monitor.   Essential Hypertension: Continue current medication regimen. PCP following. Continue to Monitor.    Memory deficit.: Continue Current medication regimen. PCP and Neurology Following. Continue to Monitor.   F/U with Dr Emeline in 4-6 weeks

## 2023-11-30 NOTE — Telephone Encounter (Signed)
 Home Health Certification or Plan of Care Tracking  Is this a Certification or Plan of Care? Plan of Care  [facility] Agency: [facility]  Order Number:  [id number]  Has charge sheet been attached? Yes  Where has form been placed:   Labeled & placed in provider bin

## 2023-12-01 ENCOUNTER — Telehealth: Payer: Self-pay | Admitting: Family Medicine

## 2023-12-01 ENCOUNTER — Telehealth: Payer: Self-pay | Admitting: Neurology

## 2023-12-01 DIAGNOSIS — I129 Hypertensive chronic kidney disease with stage 1 through stage 4 chronic kidney disease, or unspecified chronic kidney disease: Secondary | ICD-10-CM | POA: Diagnosis not present

## 2023-12-01 DIAGNOSIS — I2510 Atherosclerotic heart disease of native coronary artery without angina pectoris: Secondary | ICD-10-CM | POA: Diagnosis not present

## 2023-12-01 DIAGNOSIS — J449 Chronic obstructive pulmonary disease, unspecified: Secondary | ICD-10-CM | POA: Diagnosis not present

## 2023-12-01 DIAGNOSIS — I9589 Other hypotension: Secondary | ICD-10-CM | POA: Diagnosis not present

## 2023-12-01 DIAGNOSIS — E1122 Type 2 diabetes mellitus with diabetic chronic kidney disease: Secondary | ICD-10-CM | POA: Diagnosis not present

## 2023-12-01 DIAGNOSIS — N1832 Chronic kidney disease, stage 3b: Secondary | ICD-10-CM | POA: Diagnosis not present

## 2023-12-01 DIAGNOSIS — I340 Nonrheumatic mitral (valve) insufficiency: Secondary | ICD-10-CM | POA: Diagnosis not present

## 2023-12-01 DIAGNOSIS — G3184 Mild cognitive impairment, so stated: Secondary | ICD-10-CM | POA: Diagnosis not present

## 2023-12-01 DIAGNOSIS — Z8616 Personal history of COVID-19: Secondary | ICD-10-CM | POA: Diagnosis not present

## 2023-12-01 DIAGNOSIS — Z7901 Long term (current) use of anticoagulants: Secondary | ICD-10-CM | POA: Diagnosis not present

## 2023-12-01 DIAGNOSIS — Z7982 Long term (current) use of aspirin: Secondary | ICD-10-CM | POA: Diagnosis not present

## 2023-12-01 DIAGNOSIS — E1151 Type 2 diabetes mellitus with diabetic peripheral angiopathy without gangrene: Secondary | ICD-10-CM | POA: Diagnosis not present

## 2023-12-01 DIAGNOSIS — G936 Cerebral edema: Secondary | ICD-10-CM | POA: Diagnosis not present

## 2023-12-01 DIAGNOSIS — J439 Emphysema, unspecified: Secondary | ICD-10-CM | POA: Diagnosis not present

## 2023-12-01 DIAGNOSIS — F339 Major depressive disorder, recurrent, unspecified: Secondary | ICD-10-CM | POA: Diagnosis not present

## 2023-12-01 DIAGNOSIS — E1169 Type 2 diabetes mellitus with other specified complication: Secondary | ICD-10-CM | POA: Diagnosis not present

## 2023-12-01 DIAGNOSIS — I714 Abdominal aortic aneurysm, without rupture, unspecified: Secondary | ICD-10-CM | POA: Diagnosis not present

## 2023-12-01 DIAGNOSIS — E785 Hyperlipidemia, unspecified: Secondary | ICD-10-CM | POA: Diagnosis not present

## 2023-12-01 DIAGNOSIS — K5900 Constipation, unspecified: Secondary | ICD-10-CM | POA: Diagnosis not present

## 2023-12-01 DIAGNOSIS — I480 Paroxysmal atrial fibrillation: Secondary | ICD-10-CM | POA: Diagnosis not present

## 2023-12-01 DIAGNOSIS — I69122 Dysarthria following nontraumatic intracerebral hemorrhage: Secondary | ICD-10-CM | POA: Diagnosis not present

## 2023-12-01 DIAGNOSIS — N179 Acute kidney failure, unspecified: Secondary | ICD-10-CM | POA: Diagnosis not present

## 2023-12-01 DIAGNOSIS — I69192 Facial weakness following nontraumatic intracerebral hemorrhage: Secondary | ICD-10-CM | POA: Diagnosis not present

## 2023-12-01 DIAGNOSIS — Z87891 Personal history of nicotine dependence: Secondary | ICD-10-CM | POA: Diagnosis not present

## 2023-12-01 DIAGNOSIS — R001 Bradycardia, unspecified: Secondary | ICD-10-CM | POA: Diagnosis not present

## 2023-12-01 NOTE — Telephone Encounter (Signed)
 Placed in folder at nurse station

## 2023-12-01 NOTE — Telephone Encounter (Signed)
 Appointment details confirmed

## 2023-12-01 NOTE — Telephone Encounter (Signed)
 Home Health Certification or Plan of Care Tracking  Is this a Certification or Plan of Care? Plan of Care  [facility] Agency: [facility]  Order Number:  [id number]  Has charge sheet been attached? Yes  Where has form been placed:   Labeled & placed in provider bin

## 2023-12-01 NOTE — Telephone Encounter (Signed)
 Paperwork completed and placed in fax bin at back nurse station

## 2023-12-02 ENCOUNTER — Other Ambulatory Visit: Payer: Self-pay | Admitting: *Deleted

## 2023-12-02 DIAGNOSIS — I6523 Occlusion and stenosis of bilateral carotid arteries: Secondary | ICD-10-CM

## 2023-12-03 ENCOUNTER — Telehealth: Payer: Self-pay | Admitting: Family Medicine

## 2023-12-03 DIAGNOSIS — R001 Bradycardia, unspecified: Secondary | ICD-10-CM | POA: Diagnosis not present

## 2023-12-03 DIAGNOSIS — J439 Emphysema, unspecified: Secondary | ICD-10-CM | POA: Diagnosis not present

## 2023-12-03 DIAGNOSIS — I9589 Other hypotension: Secondary | ICD-10-CM | POA: Diagnosis not present

## 2023-12-03 DIAGNOSIS — E1151 Type 2 diabetes mellitus with diabetic peripheral angiopathy without gangrene: Secondary | ICD-10-CM | POA: Diagnosis not present

## 2023-12-03 DIAGNOSIS — J449 Chronic obstructive pulmonary disease, unspecified: Secondary | ICD-10-CM | POA: Diagnosis not present

## 2023-12-03 DIAGNOSIS — G936 Cerebral edema: Secondary | ICD-10-CM | POA: Diagnosis not present

## 2023-12-03 DIAGNOSIS — I69122 Dysarthria following nontraumatic intracerebral hemorrhage: Secondary | ICD-10-CM | POA: Diagnosis not present

## 2023-12-03 DIAGNOSIS — I480 Paroxysmal atrial fibrillation: Secondary | ICD-10-CM | POA: Diagnosis not present

## 2023-12-03 DIAGNOSIS — Z7901 Long term (current) use of anticoagulants: Secondary | ICD-10-CM | POA: Diagnosis not present

## 2023-12-03 DIAGNOSIS — I69192 Facial weakness following nontraumatic intracerebral hemorrhage: Secondary | ICD-10-CM | POA: Diagnosis not present

## 2023-12-03 DIAGNOSIS — I2510 Atherosclerotic heart disease of native coronary artery without angina pectoris: Secondary | ICD-10-CM | POA: Diagnosis not present

## 2023-12-03 DIAGNOSIS — G3184 Mild cognitive impairment, so stated: Secondary | ICD-10-CM | POA: Diagnosis not present

## 2023-12-03 DIAGNOSIS — E1169 Type 2 diabetes mellitus with other specified complication: Secondary | ICD-10-CM | POA: Diagnosis not present

## 2023-12-03 DIAGNOSIS — I129 Hypertensive chronic kidney disease with stage 1 through stage 4 chronic kidney disease, or unspecified chronic kidney disease: Secondary | ICD-10-CM | POA: Diagnosis not present

## 2023-12-03 DIAGNOSIS — E785 Hyperlipidemia, unspecified: Secondary | ICD-10-CM | POA: Diagnosis not present

## 2023-12-03 DIAGNOSIS — Z7982 Long term (current) use of aspirin: Secondary | ICD-10-CM | POA: Diagnosis not present

## 2023-12-03 DIAGNOSIS — I69359 Hemiplegia and hemiparesis following cerebral infarction affecting unspecified side: Secondary | ICD-10-CM | POA: Diagnosis not present

## 2023-12-03 DIAGNOSIS — I714 Abdominal aortic aneurysm, without rupture, unspecified: Secondary | ICD-10-CM | POA: Diagnosis not present

## 2023-12-03 DIAGNOSIS — R809 Proteinuria, unspecified: Secondary | ICD-10-CM | POA: Diagnosis not present

## 2023-12-03 DIAGNOSIS — Z8616 Personal history of COVID-19: Secondary | ICD-10-CM | POA: Diagnosis not present

## 2023-12-03 DIAGNOSIS — F339 Major depressive disorder, recurrent, unspecified: Secondary | ICD-10-CM | POA: Diagnosis not present

## 2023-12-03 DIAGNOSIS — Z87891 Personal history of nicotine dependence: Secondary | ICD-10-CM | POA: Diagnosis not present

## 2023-12-03 DIAGNOSIS — E1122 Type 2 diabetes mellitus with diabetic chronic kidney disease: Secondary | ICD-10-CM | POA: Diagnosis not present

## 2023-12-03 DIAGNOSIS — I6523 Occlusion and stenosis of bilateral carotid arteries: Secondary | ICD-10-CM | POA: Diagnosis not present

## 2023-12-03 DIAGNOSIS — I340 Nonrheumatic mitral (valve) insufficiency: Secondary | ICD-10-CM | POA: Diagnosis not present

## 2023-12-03 DIAGNOSIS — K5900 Constipation, unspecified: Secondary | ICD-10-CM | POA: Diagnosis not present

## 2023-12-03 DIAGNOSIS — N179 Acute kidney failure, unspecified: Secondary | ICD-10-CM | POA: Diagnosis not present

## 2023-12-03 DIAGNOSIS — N1832 Chronic kidney disease, stage 3b: Secondary | ICD-10-CM | POA: Diagnosis not present

## 2023-12-03 NOTE — Telephone Encounter (Signed)
 Placed in Dr.Vincent office

## 2023-12-03 NOTE — Telephone Encounter (Signed)
 Signed and placed in MA basket.  Thank you.

## 2023-12-03 NOTE — Telephone Encounter (Signed)
 Faxed back to the requested number

## 2023-12-03 NOTE — Telephone Encounter (Signed)
 Home Health Certification or Plan of Care Tracking  Is this a Certification or Plan of Care? Plan of Care  [facility] Agency: [facility]  Order Number:  [id number]  Has charge sheet been attached? Yes  Where has form been placed:   Labeled & placed in provider bin

## 2023-12-04 LAB — LAB REPORT - SCANNED
Albumin, Urine POC: 1733.4
Creatinine, POC: 382.1 mg/dL
Microalb Creat Ratio: 454

## 2023-12-06 DIAGNOSIS — E1151 Type 2 diabetes mellitus with diabetic peripheral angiopathy without gangrene: Secondary | ICD-10-CM | POA: Diagnosis not present

## 2023-12-06 DIAGNOSIS — I714 Abdominal aortic aneurysm, without rupture, unspecified: Secondary | ICD-10-CM | POA: Diagnosis not present

## 2023-12-06 DIAGNOSIS — I2510 Atherosclerotic heart disease of native coronary artery without angina pectoris: Secondary | ICD-10-CM | POA: Diagnosis not present

## 2023-12-06 DIAGNOSIS — I480 Paroxysmal atrial fibrillation: Secondary | ICD-10-CM | POA: Diagnosis not present

## 2023-12-06 DIAGNOSIS — I69192 Facial weakness following nontraumatic intracerebral hemorrhage: Secondary | ICD-10-CM | POA: Diagnosis not present

## 2023-12-06 DIAGNOSIS — Z8616 Personal history of COVID-19: Secondary | ICD-10-CM | POA: Diagnosis not present

## 2023-12-06 DIAGNOSIS — I129 Hypertensive chronic kidney disease with stage 1 through stage 4 chronic kidney disease, or unspecified chronic kidney disease: Secondary | ICD-10-CM | POA: Diagnosis not present

## 2023-12-06 DIAGNOSIS — Z7982 Long term (current) use of aspirin: Secondary | ICD-10-CM | POA: Diagnosis not present

## 2023-12-06 DIAGNOSIS — G3184 Mild cognitive impairment, so stated: Secondary | ICD-10-CM | POA: Diagnosis not present

## 2023-12-06 DIAGNOSIS — J439 Emphysema, unspecified: Secondary | ICD-10-CM | POA: Diagnosis not present

## 2023-12-06 DIAGNOSIS — I340 Nonrheumatic mitral (valve) insufficiency: Secondary | ICD-10-CM | POA: Diagnosis not present

## 2023-12-06 DIAGNOSIS — Z87891 Personal history of nicotine dependence: Secondary | ICD-10-CM | POA: Diagnosis not present

## 2023-12-06 DIAGNOSIS — E785 Hyperlipidemia, unspecified: Secondary | ICD-10-CM | POA: Diagnosis not present

## 2023-12-06 DIAGNOSIS — J449 Chronic obstructive pulmonary disease, unspecified: Secondary | ICD-10-CM | POA: Diagnosis not present

## 2023-12-06 DIAGNOSIS — Z7901 Long term (current) use of anticoagulants: Secondary | ICD-10-CM | POA: Diagnosis not present

## 2023-12-06 DIAGNOSIS — N179 Acute kidney failure, unspecified: Secondary | ICD-10-CM | POA: Diagnosis not present

## 2023-12-06 DIAGNOSIS — E1169 Type 2 diabetes mellitus with other specified complication: Secondary | ICD-10-CM | POA: Diagnosis not present

## 2023-12-06 DIAGNOSIS — F339 Major depressive disorder, recurrent, unspecified: Secondary | ICD-10-CM | POA: Diagnosis not present

## 2023-12-06 DIAGNOSIS — G936 Cerebral edema: Secondary | ICD-10-CM | POA: Diagnosis not present

## 2023-12-06 DIAGNOSIS — N1832 Chronic kidney disease, stage 3b: Secondary | ICD-10-CM | POA: Diagnosis not present

## 2023-12-06 DIAGNOSIS — I9589 Other hypotension: Secondary | ICD-10-CM | POA: Diagnosis not present

## 2023-12-06 DIAGNOSIS — R001 Bradycardia, unspecified: Secondary | ICD-10-CM | POA: Diagnosis not present

## 2023-12-06 DIAGNOSIS — I69122 Dysarthria following nontraumatic intracerebral hemorrhage: Secondary | ICD-10-CM | POA: Diagnosis not present

## 2023-12-06 DIAGNOSIS — E1122 Type 2 diabetes mellitus with diabetic chronic kidney disease: Secondary | ICD-10-CM | POA: Diagnosis not present

## 2023-12-06 DIAGNOSIS — K5900 Constipation, unspecified: Secondary | ICD-10-CM | POA: Diagnosis not present

## 2023-12-09 DIAGNOSIS — I9589 Other hypotension: Secondary | ICD-10-CM | POA: Diagnosis not present

## 2023-12-09 DIAGNOSIS — K5900 Constipation, unspecified: Secondary | ICD-10-CM | POA: Diagnosis not present

## 2023-12-09 DIAGNOSIS — E1169 Type 2 diabetes mellitus with other specified complication: Secondary | ICD-10-CM | POA: Diagnosis not present

## 2023-12-09 DIAGNOSIS — Z7901 Long term (current) use of anticoagulants: Secondary | ICD-10-CM | POA: Diagnosis not present

## 2023-12-09 DIAGNOSIS — Z7982 Long term (current) use of aspirin: Secondary | ICD-10-CM | POA: Diagnosis not present

## 2023-12-09 DIAGNOSIS — E785 Hyperlipidemia, unspecified: Secondary | ICD-10-CM | POA: Diagnosis not present

## 2023-12-09 DIAGNOSIS — Z87891 Personal history of nicotine dependence: Secondary | ICD-10-CM | POA: Diagnosis not present

## 2023-12-09 DIAGNOSIS — J439 Emphysema, unspecified: Secondary | ICD-10-CM | POA: Diagnosis not present

## 2023-12-09 DIAGNOSIS — E1122 Type 2 diabetes mellitus with diabetic chronic kidney disease: Secondary | ICD-10-CM | POA: Diagnosis not present

## 2023-12-09 DIAGNOSIS — I129 Hypertensive chronic kidney disease with stage 1 through stage 4 chronic kidney disease, or unspecified chronic kidney disease: Secondary | ICD-10-CM | POA: Diagnosis not present

## 2023-12-09 DIAGNOSIS — I69122 Dysarthria following nontraumatic intracerebral hemorrhage: Secondary | ICD-10-CM | POA: Diagnosis not present

## 2023-12-09 DIAGNOSIS — I340 Nonrheumatic mitral (valve) insufficiency: Secondary | ICD-10-CM | POA: Diagnosis not present

## 2023-12-09 DIAGNOSIS — G936 Cerebral edema: Secondary | ICD-10-CM | POA: Diagnosis not present

## 2023-12-09 DIAGNOSIS — N1832 Chronic kidney disease, stage 3b: Secondary | ICD-10-CM | POA: Diagnosis not present

## 2023-12-09 DIAGNOSIS — N179 Acute kidney failure, unspecified: Secondary | ICD-10-CM | POA: Diagnosis not present

## 2023-12-09 DIAGNOSIS — I714 Abdominal aortic aneurysm, without rupture, unspecified: Secondary | ICD-10-CM | POA: Diagnosis not present

## 2023-12-09 DIAGNOSIS — I2510 Atherosclerotic heart disease of native coronary artery without angina pectoris: Secondary | ICD-10-CM | POA: Diagnosis not present

## 2023-12-09 DIAGNOSIS — F339 Major depressive disorder, recurrent, unspecified: Secondary | ICD-10-CM | POA: Diagnosis not present

## 2023-12-09 DIAGNOSIS — E1151 Type 2 diabetes mellitus with diabetic peripheral angiopathy without gangrene: Secondary | ICD-10-CM | POA: Diagnosis not present

## 2023-12-09 DIAGNOSIS — R001 Bradycardia, unspecified: Secondary | ICD-10-CM | POA: Diagnosis not present

## 2023-12-09 DIAGNOSIS — J449 Chronic obstructive pulmonary disease, unspecified: Secondary | ICD-10-CM | POA: Diagnosis not present

## 2023-12-09 DIAGNOSIS — G3184 Mild cognitive impairment, so stated: Secondary | ICD-10-CM | POA: Diagnosis not present

## 2023-12-09 DIAGNOSIS — I480 Paroxysmal atrial fibrillation: Secondary | ICD-10-CM | POA: Diagnosis not present

## 2023-12-09 DIAGNOSIS — I69192 Facial weakness following nontraumatic intracerebral hemorrhage: Secondary | ICD-10-CM | POA: Diagnosis not present

## 2023-12-09 DIAGNOSIS — Z8616 Personal history of COVID-19: Secondary | ICD-10-CM | POA: Diagnosis not present

## 2023-12-10 ENCOUNTER — Telehealth: Payer: Self-pay | Admitting: Family Medicine

## 2023-12-10 NOTE — Telephone Encounter (Signed)
 Patient have [facility] documents to reviewed

## 2023-12-13 ENCOUNTER — Telehealth: Payer: Self-pay | Admitting: Family Medicine

## 2023-12-13 NOTE — Telephone Encounter (Signed)
 Orders have been signed and faxed. Look in medica tab for verification

## 2023-12-13 NOTE — Telephone Encounter (Signed)
 Home Health Certification or Plan of Care Tracking  Is this a Certification or Plan of Care? Plan of Care  [facility] Agency: [facility]  Order Number:  [id number]  Has charge sheet been attached? Yes  Where has form been placed:   Labeled & placed in provider bin

## 2023-12-14 DIAGNOSIS — J449 Chronic obstructive pulmonary disease, unspecified: Secondary | ICD-10-CM | POA: Diagnosis not present

## 2023-12-14 DIAGNOSIS — E1122 Type 2 diabetes mellitus with diabetic chronic kidney disease: Secondary | ICD-10-CM | POA: Diagnosis not present

## 2023-12-14 DIAGNOSIS — E1169 Type 2 diabetes mellitus with other specified complication: Secondary | ICD-10-CM | POA: Diagnosis not present

## 2023-12-14 DIAGNOSIS — N1832 Chronic kidney disease, stage 3b: Secondary | ICD-10-CM | POA: Diagnosis not present

## 2023-12-14 DIAGNOSIS — J439 Emphysema, unspecified: Secondary | ICD-10-CM | POA: Diagnosis not present

## 2023-12-14 DIAGNOSIS — E1151 Type 2 diabetes mellitus with diabetic peripheral angiopathy without gangrene: Secondary | ICD-10-CM | POA: Diagnosis not present

## 2023-12-14 DIAGNOSIS — I69192 Facial weakness following nontraumatic intracerebral hemorrhage: Secondary | ICD-10-CM | POA: Diagnosis not present

## 2023-12-14 DIAGNOSIS — I69122 Dysarthria following nontraumatic intracerebral hemorrhage: Secondary | ICD-10-CM | POA: Diagnosis not present

## 2023-12-14 DIAGNOSIS — F339 Major depressive disorder, recurrent, unspecified: Secondary | ICD-10-CM | POA: Diagnosis not present

## 2023-12-14 DIAGNOSIS — I480 Paroxysmal atrial fibrillation: Secondary | ICD-10-CM | POA: Diagnosis not present

## 2023-12-14 DIAGNOSIS — I129 Hypertensive chronic kidney disease with stage 1 through stage 4 chronic kidney disease, or unspecified chronic kidney disease: Secondary | ICD-10-CM | POA: Diagnosis not present

## 2023-12-14 NOTE — Telephone Encounter (Signed)
 Forms completed and returned to Tonya

## 2023-12-14 NOTE — Telephone Encounter (Signed)
 Faxed and placed in scan bin

## 2023-12-14 NOTE — Telephone Encounter (Signed)
 Placed in folder at nurse station

## 2023-12-15 DIAGNOSIS — Z7982 Long term (current) use of aspirin: Secondary | ICD-10-CM | POA: Diagnosis not present

## 2023-12-15 DIAGNOSIS — E785 Hyperlipidemia, unspecified: Secondary | ICD-10-CM | POA: Diagnosis not present

## 2023-12-15 DIAGNOSIS — I69192 Facial weakness following nontraumatic intracerebral hemorrhage: Secondary | ICD-10-CM | POA: Diagnosis not present

## 2023-12-15 DIAGNOSIS — I2510 Atherosclerotic heart disease of native coronary artery without angina pectoris: Secondary | ICD-10-CM | POA: Diagnosis not present

## 2023-12-15 DIAGNOSIS — J439 Emphysema, unspecified: Secondary | ICD-10-CM | POA: Diagnosis not present

## 2023-12-15 DIAGNOSIS — E1151 Type 2 diabetes mellitus with diabetic peripheral angiopathy without gangrene: Secondary | ICD-10-CM | POA: Diagnosis not present

## 2023-12-15 DIAGNOSIS — R001 Bradycardia, unspecified: Secondary | ICD-10-CM | POA: Diagnosis not present

## 2023-12-15 DIAGNOSIS — N1832 Chronic kidney disease, stage 3b: Secondary | ICD-10-CM | POA: Diagnosis not present

## 2023-12-15 DIAGNOSIS — Z7901 Long term (current) use of anticoagulants: Secondary | ICD-10-CM | POA: Diagnosis not present

## 2023-12-15 DIAGNOSIS — I714 Abdominal aortic aneurysm, without rupture, unspecified: Secondary | ICD-10-CM | POA: Diagnosis not present

## 2023-12-15 DIAGNOSIS — G936 Cerebral edema: Secondary | ICD-10-CM | POA: Diagnosis not present

## 2023-12-15 DIAGNOSIS — I69122 Dysarthria following nontraumatic intracerebral hemorrhage: Secondary | ICD-10-CM | POA: Diagnosis not present

## 2023-12-15 DIAGNOSIS — Z87891 Personal history of nicotine dependence: Secondary | ICD-10-CM | POA: Diagnosis not present

## 2023-12-15 DIAGNOSIS — N179 Acute kidney failure, unspecified: Secondary | ICD-10-CM | POA: Diagnosis not present

## 2023-12-15 DIAGNOSIS — E1169 Type 2 diabetes mellitus with other specified complication: Secondary | ICD-10-CM | POA: Diagnosis not present

## 2023-12-15 DIAGNOSIS — I480 Paroxysmal atrial fibrillation: Secondary | ICD-10-CM | POA: Diagnosis not present

## 2023-12-15 DIAGNOSIS — I9589 Other hypotension: Secondary | ICD-10-CM | POA: Diagnosis not present

## 2023-12-15 DIAGNOSIS — K5900 Constipation, unspecified: Secondary | ICD-10-CM | POA: Diagnosis not present

## 2023-12-15 DIAGNOSIS — J449 Chronic obstructive pulmonary disease, unspecified: Secondary | ICD-10-CM | POA: Diagnosis not present

## 2023-12-15 DIAGNOSIS — I340 Nonrheumatic mitral (valve) insufficiency: Secondary | ICD-10-CM | POA: Diagnosis not present

## 2023-12-15 DIAGNOSIS — F339 Major depressive disorder, recurrent, unspecified: Secondary | ICD-10-CM | POA: Diagnosis not present

## 2023-12-15 DIAGNOSIS — G3184 Mild cognitive impairment, so stated: Secondary | ICD-10-CM | POA: Diagnosis not present

## 2023-12-15 DIAGNOSIS — Z8616 Personal history of COVID-19: Secondary | ICD-10-CM | POA: Diagnosis not present

## 2023-12-15 DIAGNOSIS — E1122 Type 2 diabetes mellitus with diabetic chronic kidney disease: Secondary | ICD-10-CM | POA: Diagnosis not present

## 2023-12-15 DIAGNOSIS — I129 Hypertensive chronic kidney disease with stage 1 through stage 4 chronic kidney disease, or unspecified chronic kidney disease: Secondary | ICD-10-CM | POA: Diagnosis not present

## 2023-12-20 ENCOUNTER — Other Ambulatory Visit: Payer: Self-pay | Admitting: Family Medicine

## 2023-12-20 ENCOUNTER — Encounter: Payer: Self-pay | Admitting: Podiatry

## 2023-12-20 ENCOUNTER — Ambulatory Visit (INDEPENDENT_AMBULATORY_CARE_PROVIDER_SITE_OTHER): Admitting: Podiatry

## 2023-12-20 DIAGNOSIS — E1142 Type 2 diabetes mellitus with diabetic polyneuropathy: Secondary | ICD-10-CM | POA: Diagnosis not present

## 2023-12-20 DIAGNOSIS — M79674 Pain in right toe(s): Secondary | ICD-10-CM

## 2023-12-20 DIAGNOSIS — Z7901 Long term (current) use of anticoagulants: Secondary | ICD-10-CM

## 2023-12-20 DIAGNOSIS — B351 Tinea unguium: Secondary | ICD-10-CM | POA: Diagnosis not present

## 2023-12-20 DIAGNOSIS — M79675 Pain in left toe(s): Secondary | ICD-10-CM

## 2023-12-20 MED ORDER — DONEPEZIL HCL 5 MG PO TABS: 5.0000 mg | ORAL_TABLET | Freq: Every day | ORAL | 1 refills | Status: DC

## 2023-12-20 MED ORDER — GABAPENTIN 100 MG PO CAPS: 100.0000 mg | ORAL_CAPSULE | Freq: Every day | ORAL | 1 refills | Status: AC

## 2023-12-20 NOTE — Progress Notes (Signed)
 This patient returns to my office for at risk foot care.  This patient requires this care by a professional since this patient will be at risk due to having diabetes and coagulation defect. This patient is unable to cut nails himself since the patient cannot reach his nails.These nails are painful walking and wearing shoes.  This patient presents for at risk foot care today.  General Appearance  Alert, conversant and in no acute stress.  Vascular  Dorsalis pedis and posterior tibial  pulses are palpable  bilaterally.  Capillary return is within normal limits  bilaterally. Temperature is within normal limits  bilaterally.  Neurologic  Senn-Weinstein monofilament wire test within normal limits  bilaterally. Muscle power within normal limits bilaterally.  Nails Thick disfigured discolored nails with subungual debris  from hallux to fifth toes bilaterally. No evidence of bacterial infection or drainage bilaterally.  Orthopedic  No limitations of motion  feet .  No crepitus or effusions noted.  No bony pathology or digital deformities noted. Hallux limitus 1st MPJ right foot.  Skin  normotropic skin with no porokeratosis noted bilaterally.  No signs of infections or ulcers noted.     Onychomycosis  Pain in right toes  Pain in left toes  Consent was obtained for treatment procedures.   Mechanical debridement of nails 1-5  bilaterally performed with a nail nipper.  Filed with dremel without incident.    Return office visit     3 months                 Told patient to return for periodic foot care and evaluation due to potential at risk complications.   Cordella Bold DPM

## 2023-12-20 NOTE — Telephone Encounter (Signed)
 I see that these medications were refilled by [facility]. I just wanted to touch base with you and double check its okay to refill under your name?

## 2023-12-20 NOTE — Telephone Encounter (Signed)
 Copied from CRM #[id number]. Topic: Clinical - Medication Refill >> Dec 20, 2023  7:40 AM Mercedes MATSU wrote: Medication:  gabapentin  (NEURONTIN ) 100 MG capsule  donepezil  (ARICEPT ) 5 MG tablet   Has the patient contacted their pharmacy? Yes (Agent: If no, request that the patient contact the pharmacy for the refill. If patient does not wish to contact the pharmacy document the reason why and proceed with request.) (Agent: If yes, when and what did the pharmacy advise?)  This is the patient's preferred pharmacy:  [facility] [address], [place] - [address] [place] [address] Phone: [phone] Fax: [phone]  Is this the correct pharmacy for this prescription? Yes If no, delete pharmacy and type the correct one.   Has the prescription been filled recently? Yes  Is the patient out of the medication? Yes  Has the patient been seen for an appointment in the last year OR does the patient have an upcoming appointment? Yes  Can we respond through MyChart? Yes  Agent: Please be advised that Rx refills may take up to 3 business days. We ask that you follow-up with your pharmacy.

## 2023-12-22 ENCOUNTER — Encounter: Payer: Self-pay | Admitting: Neurology

## 2023-12-22 ENCOUNTER — Ambulatory Visit: Admitting: Neurology

## 2023-12-22 VITALS — BP 110/68 | HR 66 | Ht 71.0 in | Wt 153.8 lb

## 2023-12-22 DIAGNOSIS — Z9189 Other specified personal risk factors, not elsewhere classified: Secondary | ICD-10-CM | POA: Diagnosis not present

## 2023-12-22 DIAGNOSIS — G255 Other chorea: Secondary | ICD-10-CM | POA: Diagnosis not present

## 2023-12-22 DIAGNOSIS — K5900 Constipation, unspecified: Secondary | ICD-10-CM | POA: Diagnosis not present

## 2023-12-22 DIAGNOSIS — Z7901 Long term (current) use of anticoagulants: Secondary | ICD-10-CM | POA: Diagnosis not present

## 2023-12-22 DIAGNOSIS — N179 Acute kidney failure, unspecified: Secondary | ICD-10-CM | POA: Diagnosis not present

## 2023-12-22 DIAGNOSIS — E785 Hyperlipidemia, unspecified: Secondary | ICD-10-CM | POA: Diagnosis not present

## 2023-12-22 DIAGNOSIS — J439 Emphysema, unspecified: Secondary | ICD-10-CM | POA: Diagnosis not present

## 2023-12-22 DIAGNOSIS — F339 Major depressive disorder, recurrent, unspecified: Secondary | ICD-10-CM | POA: Diagnosis not present

## 2023-12-22 DIAGNOSIS — Z8616 Personal history of COVID-19: Secondary | ICD-10-CM | POA: Diagnosis not present

## 2023-12-22 DIAGNOSIS — E1151 Type 2 diabetes mellitus with diabetic peripheral angiopathy without gangrene: Secondary | ICD-10-CM | POA: Diagnosis not present

## 2023-12-22 DIAGNOSIS — Z87891 Personal history of nicotine dependence: Secondary | ICD-10-CM | POA: Diagnosis not present

## 2023-12-22 DIAGNOSIS — J449 Chronic obstructive pulmonary disease, unspecified: Secondary | ICD-10-CM | POA: Diagnosis not present

## 2023-12-22 DIAGNOSIS — I482 Chronic atrial fibrillation, unspecified: Secondary | ICD-10-CM

## 2023-12-22 DIAGNOSIS — I610 Nontraumatic intracerebral hemorrhage in hemisphere, subcortical: Secondary | ICD-10-CM

## 2023-12-22 DIAGNOSIS — I714 Abdominal aortic aneurysm, without rupture, unspecified: Secondary | ICD-10-CM | POA: Diagnosis not present

## 2023-12-22 DIAGNOSIS — I9589 Other hypotension: Secondary | ICD-10-CM | POA: Diagnosis not present

## 2023-12-22 DIAGNOSIS — I69192 Facial weakness following nontraumatic intracerebral hemorrhage: Secondary | ICD-10-CM | POA: Diagnosis not present

## 2023-12-22 DIAGNOSIS — I340 Nonrheumatic mitral (valve) insufficiency: Secondary | ICD-10-CM | POA: Diagnosis not present

## 2023-12-22 DIAGNOSIS — I480 Paroxysmal atrial fibrillation: Secondary | ICD-10-CM | POA: Diagnosis not present

## 2023-12-22 DIAGNOSIS — I129 Hypertensive chronic kidney disease with stage 1 through stage 4 chronic kidney disease, or unspecified chronic kidney disease: Secondary | ICD-10-CM | POA: Diagnosis not present

## 2023-12-22 DIAGNOSIS — E1169 Type 2 diabetes mellitus with other specified complication: Secondary | ICD-10-CM | POA: Diagnosis not present

## 2023-12-22 DIAGNOSIS — E1122 Type 2 diabetes mellitus with diabetic chronic kidney disease: Secondary | ICD-10-CM | POA: Diagnosis not present

## 2023-12-22 DIAGNOSIS — I619 Nontraumatic intracerebral hemorrhage, unspecified: Secondary | ICD-10-CM

## 2023-12-22 DIAGNOSIS — I69122 Dysarthria following nontraumatic intracerebral hemorrhage: Secondary | ICD-10-CM | POA: Diagnosis not present

## 2023-12-22 DIAGNOSIS — N1832 Chronic kidney disease, stage 3b: Secondary | ICD-10-CM | POA: Diagnosis not present

## 2023-12-22 DIAGNOSIS — R001 Bradycardia, unspecified: Secondary | ICD-10-CM | POA: Diagnosis not present

## 2023-12-22 DIAGNOSIS — I2510 Atherosclerotic heart disease of native coronary artery without angina pectoris: Secondary | ICD-10-CM | POA: Diagnosis not present

## 2023-12-22 DIAGNOSIS — Z7982 Long term (current) use of aspirin: Secondary | ICD-10-CM | POA: Diagnosis not present

## 2023-12-22 DIAGNOSIS — G3184 Mild cognitive impairment, so stated: Secondary | ICD-10-CM | POA: Diagnosis not present

## 2023-12-22 DIAGNOSIS — G936 Cerebral edema: Secondary | ICD-10-CM | POA: Diagnosis not present

## 2023-12-22 NOTE — Patient Instructions (Signed)
 I had a long discussion with the patient, his wife and son regarding his recent basal ganglia hemorrhage related to hypertensive warfarin usage.  The patient significant left hemichorea but at the present time is refusing medications for the same.  I recommend strict control of hypertension with blood pressure goal below 130/90.  Continue participation in either ASPIRE stroke prevention study (Eliquis  versus aspirin  for intracerebral hemorrhage patient  with atrial fibrillation) Recommend discontinue Aricept  as patient is having GI side effects likely from.  I encouraged him to use his walker at all times.  Return for follow-up in the future in 3 months with nurse practitioner call earlier if necessary.  Keep scheduled follow-up for ASPIRE stroke study.

## 2023-12-22 NOTE — Progress Notes (Signed)
 [facility] [address] [place]. [place] [address] [phone]       OFFICE FOLLOW-UP NOTE  [name]. Date of Birth:  10-16-1945 Medical Record Number:  [id number]   HPI: Mr. Schiavo is a pleasant 78 year old Caucasian male seen today for initial office follow-up visit following hospital admission for intracerebral hemorrhage in October 2025.  He is accompanied by his wife and son.  History is obtained from them and review of electronic medical records and I personally reviewed pertinent available imaging films in PACS.  He has past medical history of hypertension, hyperlipidemia, coronary artery disease, peripheral arterial disease, memory loss, atrial fibrillation on Coumadin  and former tobacco abuse.  Patient presented on 11/01/2023 for evaluation for unstable gait.  He admitted to having a fall on 10/31/2018 5 in the afternoon and he felt dizzy and faint he had a minor head injury without loss of consciousness.  He had subsequently trouble walking and was noted to have left facial weakness and slurred speech as well.  CT head showed 1.8 cm right basal ganglia parenchymal hemorrhage centered around the posterior internal capsule   He was admitted to the ICU.    MRI scan showed stable appearance of the hemorrhage as well as multiple chronic microhemorrhages involving pons, cerebellum, and supratentorial white matter.  Patient was transferred to inpatient rehab where he spent a few weeks currently at home.  He is now able to walk with a walker.  He is currently doing outpatient physical and Occupational Therapy.  Patient has noticed involuntary movements in the left upper and lower extremities which are difficult for him to control.  These are intermittent and controlled.  Patient was also started on Aricept  5 mg daily for memory loss.    He seems to be having GI upset with increased bowel movements, frequency and urge incontinence.  On cognitive testing today scored  24/30.  Patient has signed consent and is participating in ASPIRE stroke prevention study(Eliquis  versus aspirin  in patients with atrial fibrillation after brain hemorrhage ) .  ROS:   14 system review of systems is positive for weakness, difficulty walking, involuntary movements, incoordination all other systems negative  PMH:  Past Medical History:  Diagnosis Date   Arthritis    Carotid artery disease    Closed fracture dislocation of right elbow    30 years ago -- fell out of theback of a truck   Diabetes mellitus without complication (HCC)    Diverticulosis    Hemorrhoids    Hx of colonic polyps 02/23/2017   Hyperlipidemia    Hypertension    Diagnosed 10 years ago. No on medication. Denies history of CAD.    Memory loss    PAD (peripheral artery disease)     Social History:  Social History   Socioeconomic History   Marital status: Married    Spouse name: Ginger   Number of children: 3   Years of education: Not on file   Highest education level: 12th grade  Occupational History   Occupation: Retired    Comment: Curator  Tobacco Use   Smoking status: Former    Types: Cigarettes    Start date: 06/27/2022    Quit date: 1968    Years since quitting: 57.9   Smokeless tobacco: Never   Tobacco comments:    Taking Nicotine  lozenges  Vaping Use   Vaping status: Never Used  Substance and Sexual Activity   Alcohol use: No   Drug use: No  Sexual activity: Yes  Other Topics Concern   Not on file  Social History Narrative   Social Review      Patient lives at home with. Spouse Hridaan Bouse   Pt lives in a one or two story home? One story home   Does patient lives in a facility? If so where? Private home   What is patient highest level of education? 12 grade   Pt is RIGHT handed            Daughter died in December 21, 2021  Social Drivers of Health   Financial Resource Strain: Low Risk  (09/14/2023)   Overall Financial Resource Strain (CARDIA)    Difficulty of  Paying Living Expenses: Not hard at all  Food Insecurity: No Food Insecurity (11/02/2023)   Hunger Vital Sign    Worried About Running Out of Food in the Last Year: Never true    Ran Out of Food in the Last Year: Never true  Transportation Needs: No Transportation Needs (11/02/2023)   PRAPARE - Administrator, Civil Service (Medical): No    Lack of Transportation (Non-Medical): No  Physical Activity: Inactive (09/14/2023)   Exercise Vital Sign    Days of Exercise per Week: 0 days    Minutes of Exercise per Session: 0 min  Stress: No Stress Concern Present (09/14/2023)   [employer] of Occupational Health - Occupational Stress Questionnaire    Feeling of Stress: Not at all  Social Connections: Moderately Isolated (11/02/2023)   Social Connection and Isolation Panel    Frequency of Communication with Friends and Family: Twice a week    Frequency of Social Gatherings with Friends and Family: Once a week    Attends Religious Services: Never    Database Administrator or Organizations: No    Attends Banker Meetings: Never    Marital Status: Married  Catering Manager Violence: Not At Risk (11/02/2023)   Humiliation, Afraid, Rape, and Kick questionnaire    Fear of Current or Ex-Partner: No    Emotionally Abused: No    Physically Abused: No    Sexually Abused: No    Medications:   Current Outpatient Medications on File Prior to Visit  Medication Sig Dispense Refill   acetaminophen  (TYLENOL ) 325 MG tablet Take 1-2 tablets (325-650 mg total) by mouth every 4 (four) hours as needed for mild pain (pain score 1-3).     amLODipine  (NORVASC ) 10 MG tablet TAKE 1 TABLET BY MOUTH EVERY DAY 90 tablet 1   diclofenac  Sodium (VOLTAREN ) 1 % GEL Apply 2 g topically 4 (four) times daily. To left foot and ankle 300 g 0   donepezil  (ARICEPT ) 5 MG tablet Take 1 tablet (5 mg total) by mouth at bedtime. 90 tablet 1   escitalopram  (LEXAPRO ) 20 MG tablet Take 1 tablet (20 mg total) by  mouth daily. 90 tablet 3   gabapentin  (NEURONTIN ) 100 MG capsule Take 1 capsule (100 mg total) by mouth at bedtime. 90 capsule 1   losartan  (COZAAR ) 50 MG tablet TAKE 1 TABLET BY MOUTH EVERY DAY 90 tablet 1   nicotine  polacrilex (COMMIT) 2 MG lozenge Take 2 mg by mouth 4 (four) times daily as needed for smoking cessation.     rosuvastatin  (CRESTOR ) 40 MG tablet TAKE 1 TABLET BY MOUTH EVERY DAY 90 tablet 2   STUDY - ASPIRE - apixaban  5 mg or placebo tablet (PI-Kory Rains) Take 1 tablet (5 mg total) by mouth 2 (two) times daily.  60 tablet 0   STUDY - ASPIRE - aspirin  81 mg or placebo tablet (PI-Christoper Bushey) Take 1 tablet (81 mg total) by mouth daily. 90 tablet 0   omega-3 acid ethyl esters (LOVAZA ) 1 g capsule Take 1 capsule (1 g total) by mouth 2 (two) times daily. (Patient not taking: Reported on 12/22/2023) 60 capsule 0   senna-docusate (SENOKOT-S) 8.6-50 MG tablet Take 2 tablets by mouth daily at 6 (six) AM. (Patient not taking: Reported on 12/22/2023) 60 tablet 0   [DISCONTINUED] metoprolol  tartrate (LOPRESSOR ) 25 MG tablet Take 1 tablet (25 mg total) by mouth 2 (two) times daily. 60 tablet 3   No current facility-administered medications on file prior to visit.    Allergies:   Allergies  Allergen Reactions   Antibacterial Hand Soap [Triclosan] Other (See Comments)    Caused water blisters    Physical Exam General: well developed, well nourished pleasant elderly Caucasian male, seated, in no evident distress Head: head normocephalic and atraumatic.  Neck: supple with no carotid or supraclavicular bruits Cardiovascular: regular rate and rhythm, no murmurs Musculoskeletal: no deformity Skin:  no rash/petichiae Vascular:  Normal pulses all extremities Vitals:   12/22/23 0849  BP: 110/68  Pulse: 66  SpO2: 96%   Neurologic Exam Mental Status: Awake and fully alert. Oriented to place and time. Recent and remote memory intact. Attention span, concentration and fund of knowledge appropriate. Mood  and affect appropriate.  Diminished recall 0/3.  Able to name 11 animals which can walk on  4 legs.  Clock drawing 4/4. Cranial Nerves: Fundoscopic exam reveals sharp disc margins. Pupils equal, briskly reactive to light. Extraocular movements full without nystagmus. Visual fields full to confrontation. Hearing intact. Facial sensation intact. Face, tongue, palate moves normally and symmetrically.  Motor: Normal bulk and tone. Normal strength in all tested extremity muscles.  Intermittent hematuria with lifting semipurposeful movements distally Sensory.: intact to touch ,pinprick .position and vibratory sensation.  Coordination: Impaired on the left side due to involuntary movements.   Gait and Station: Arises from chair without difficulty. Stance is normal. Gait demonstrates normal stride length and balance . Able to heel, toe and tandem walk without difficulty.  Reflexes: 1+ and symmetric. Toes downgoing.   NIHSS  2 Modified Rankin  3    12/22/2023    8:53 AM 06/15/2018   12:48 PM  MMSE - Mini Mental State Exam  Orientation to time 4 4  Orientation to Place 5 5  Registration 3 3  Attention/ Calculation 1 3  Recall 3 2  Language- name 2 objects 2 2  Language- repeat 0 1  Language- follow 3 step command 3 3  Language- read & follow direction 1 1  Write a sentence 1 1  Copy design 1 1  Total score 24 26     ASSESSMENT: 78 year old Caucasian male with right basal ganglia intracerebral hemorrhage October 2025 related to hypertensive anticoagulation effect of warfarin with chronic atrial fibrillation.  Patient has significant left hemichorea.     PLAN:I had a long discussion with the patient, his wife and son regarding his recent basal ganglia hemorrhage related to hypertensive warfarin usage.  The patient significant left hemichorea but at the present time is refusing medications for the same.  I recommend strict control of hypertension with blood pressure goal below 130/90.  Continue  participation in either ASPIRE stroke prevention study (Eliquis  versus aspirin  for intracerebral hemorrhage patient  with atrial fibrillation).Recommend discontinue Aricept  as patient is having GI side effects likely  from.  I encouraged him to use his walker at all times.  Patient has significant left hemichorea and I discussed medication treatment options including Haldol or Klonopin but at the present time patient and family are declining this.  Return for follow-up in the future in 3 months with nurse practitioner call earlier if necessary.  Keep scheduled follow-up for ASPIRE stroke study.  Greater than 50% of time during this 35 minute visit was spent on counseling,explanation of diagnosis, planning of further management, discussion with patient and family and coordination of care Eather Popp, MD Note: This document was prepared with digital dictation and possible smart phrase technology. Any transcriptional errors that result from this process are unintentional

## 2023-12-27 DIAGNOSIS — J439 Emphysema, unspecified: Secondary | ICD-10-CM | POA: Diagnosis not present

## 2023-12-27 DIAGNOSIS — K5900 Constipation, unspecified: Secondary | ICD-10-CM | POA: Diagnosis not present

## 2023-12-27 DIAGNOSIS — I2510 Atherosclerotic heart disease of native coronary artery without angina pectoris: Secondary | ICD-10-CM | POA: Diagnosis not present

## 2023-12-27 DIAGNOSIS — Z7901 Long term (current) use of anticoagulants: Secondary | ICD-10-CM | POA: Diagnosis not present

## 2023-12-27 DIAGNOSIS — I69122 Dysarthria following nontraumatic intracerebral hemorrhage: Secondary | ICD-10-CM | POA: Diagnosis not present

## 2023-12-27 DIAGNOSIS — Z8616 Personal history of COVID-19: Secondary | ICD-10-CM | POA: Diagnosis not present

## 2023-12-27 DIAGNOSIS — J449 Chronic obstructive pulmonary disease, unspecified: Secondary | ICD-10-CM | POA: Diagnosis not present

## 2023-12-27 DIAGNOSIS — I714 Abdominal aortic aneurysm, without rupture, unspecified: Secondary | ICD-10-CM | POA: Diagnosis not present

## 2023-12-27 DIAGNOSIS — I340 Nonrheumatic mitral (valve) insufficiency: Secondary | ICD-10-CM | POA: Diagnosis not present

## 2023-12-27 DIAGNOSIS — I69192 Facial weakness following nontraumatic intracerebral hemorrhage: Secondary | ICD-10-CM | POA: Diagnosis not present

## 2023-12-27 DIAGNOSIS — E1169 Type 2 diabetes mellitus with other specified complication: Secondary | ICD-10-CM | POA: Diagnosis not present

## 2023-12-27 DIAGNOSIS — N179 Acute kidney failure, unspecified: Secondary | ICD-10-CM | POA: Diagnosis not present

## 2023-12-27 DIAGNOSIS — I9589 Other hypotension: Secondary | ICD-10-CM | POA: Diagnosis not present

## 2023-12-27 DIAGNOSIS — Z87891 Personal history of nicotine dependence: Secondary | ICD-10-CM | POA: Diagnosis not present

## 2023-12-27 DIAGNOSIS — I129 Hypertensive chronic kidney disease with stage 1 through stage 4 chronic kidney disease, or unspecified chronic kidney disease: Secondary | ICD-10-CM | POA: Diagnosis not present

## 2023-12-27 DIAGNOSIS — Z7982 Long term (current) use of aspirin: Secondary | ICD-10-CM | POA: Diagnosis not present

## 2023-12-27 DIAGNOSIS — N1832 Chronic kidney disease, stage 3b: Secondary | ICD-10-CM | POA: Diagnosis not present

## 2023-12-27 DIAGNOSIS — I480 Paroxysmal atrial fibrillation: Secondary | ICD-10-CM | POA: Diagnosis not present

## 2023-12-27 DIAGNOSIS — E1151 Type 2 diabetes mellitus with diabetic peripheral angiopathy without gangrene: Secondary | ICD-10-CM | POA: Diagnosis not present

## 2023-12-27 DIAGNOSIS — R001 Bradycardia, unspecified: Secondary | ICD-10-CM | POA: Diagnosis not present

## 2023-12-27 DIAGNOSIS — G936 Cerebral edema: Secondary | ICD-10-CM | POA: Diagnosis not present

## 2023-12-27 DIAGNOSIS — E1122 Type 2 diabetes mellitus with diabetic chronic kidney disease: Secondary | ICD-10-CM | POA: Diagnosis not present

## 2023-12-27 DIAGNOSIS — F339 Major depressive disorder, recurrent, unspecified: Secondary | ICD-10-CM | POA: Diagnosis not present

## 2023-12-27 DIAGNOSIS — G3184 Mild cognitive impairment, so stated: Secondary | ICD-10-CM | POA: Diagnosis not present

## 2023-12-27 DIAGNOSIS — E785 Hyperlipidemia, unspecified: Secondary | ICD-10-CM | POA: Diagnosis not present

## 2023-12-31 ENCOUNTER — Telehealth: Payer: Self-pay | Admitting: Family Medicine

## 2023-12-31 NOTE — Telephone Encounter (Signed)
 Placed in POD A sign folder

## 2023-12-31 NOTE — Telephone Encounter (Signed)
 Home Health Certification or Plan of Care Tracking  Is this a Certification or Plan of Care? Plan of Care  [facility] Agency: [facility]  Order Number:  [id number]  Has charge sheet been attached? Yes  Where has form been placed:   Labeled & placed in provider bin

## 2024-01-03 NOTE — Telephone Encounter (Signed)
 Forms completed and returned to Tonya

## 2024-01-04 ENCOUNTER — Encounter: Payer: Self-pay | Admitting: Family Medicine

## 2024-01-04 ENCOUNTER — Telehealth: Payer: Self-pay

## 2024-01-04 ENCOUNTER — Ambulatory Visit: Admitting: Family Medicine

## 2024-01-04 VITALS — BP 108/72 | HR 71 | Temp 98.0°F | Ht 71.0 in | Wt 151.5 lb

## 2024-01-04 DIAGNOSIS — G479 Sleep disorder, unspecified: Secondary | ICD-10-CM

## 2024-01-04 DIAGNOSIS — I10 Essential (primary) hypertension: Secondary | ICD-10-CM | POA: Diagnosis not present

## 2024-01-04 NOTE — Assessment & Plan Note (Signed)
 Deteriorated.  Wife reports he continues to stay up very late which continues to impact daytime sleepiness, but she says he's even more sleepy than usual and has fatigue w/ minimal exertion/activity.  They admit he did not have good rehab after his stroke due to cancelling visits.  He admits that he sleeps out of boredom.  Both he and his wife say that he is awake and engaged if there is something going on that he is interested in.  I suspect he is dealing w/ an offset sleep cycle and boredom.  He does not seem distressed by the amount of sleeping he is doing nor interested in trying to get his sleep cycle back on track as he enjoys staying up to watch 'whatever I want' on TV.  For now, will see if decreasing Amlodipine  improves energy levels.  He is due for labs next month.  Will hold until then unless something changes.  Pt expressed understanding and is in agreement w/ plan.

## 2024-01-04 NOTE — Patient Instructions (Signed)
 Follow up in 1 month to recheck BP and sugar DECREASE the Amlodipine  to 1/2 tab (5 mg) daily CONTINUE the Losartan  No other med changes at this time time Try and set a daily schedule to get your sleep cycle back on track Call with any questions or concerns Stay Safe!  Stay Healthy! Merry Christmas!!!

## 2024-01-04 NOTE — Telephone Encounter (Signed)
 Faxed and placed in scan bin

## 2024-01-04 NOTE — Assessment & Plan Note (Signed)
 BP appears to be overly controlled today which may be contributing to his ongoing sleepiness.  Will decrease Amlodipine  to 1/2 tab daily and continue to monitor.  Pt expressed understanding and is in agreement w/ plan.

## 2024-01-04 NOTE — Telephone Encounter (Signed)
 Copied from CRM [id number]. Topic: General - Other >> Jan 04, 2024  4:04 PM Burnard DEL wrote: Reason for CRM: Joesph from [facility] called to ask about orders from 12/01/2023 with order #:[id number]  and order [id number]  from 12/31/2023 that they have not received back just yet. Order are scanned in but not signed by provider. She would like to know if they could be signed and sent back please.

## 2024-01-04 NOTE — Progress Notes (Signed)
   Subjective:    Patient ID: John LOISE Francia Mickey., male    DOB: Jun 13, 1945, 78 y.o.   MRN: [id number]  HPI HTN- chronic problem.  Currently on Amlodipine  10gm daily, Losartan  50mg  daily.  BP is low at 108/72.  Pt reports feeling 'pretty good'.  Wife reports fatigue/exhaustion w/ minimal activity (putting on socks).  He is sleeping more than before.  He is taking 'morning medicine' at 3-4pm and they are having a hard time getting him to take a 2nd dose of medication.  Only eating 1-2x/day b/c of sleep.  Pt states that he is staying up very late to watch TV- not going to bed until early morning- and this is impacting his sleep schedule.  Wife reports that if they have company or something scheduled, he will be up and engage.  Pt reports he will sleep out of boredom.  Denies CP, SOB, HA's, visual changes, edema.   Review of Systems For ROS see HPI     Objective:   Physical Exam Vitals reviewed.  Constitutional:      General: He is not in acute distress.    Appearance: Normal appearance. He is well-developed. He is not ill-appearing.  HENT:     Head: Normocephalic and atraumatic.  Eyes:     Extraocular Movements: Extraocular movements intact.     Conjunctiva/sclera: Conjunctivae normal.     Pupils: Pupils are equal, round, and reactive to light.  Neck:     Thyroid : No thyromegaly.  Cardiovascular:     Rate and Rhythm: Normal rate. Rhythm irregular.     Pulses: Normal pulses.     Heart sounds: Normal heart sounds. No murmur heard. Pulmonary:     Effort: Pulmonary effort is normal. No respiratory distress.     Breath sounds: Normal breath sounds.  Abdominal:     General: Bowel sounds are normal. There is no distension.     Palpations: Abdomen is soft.  Musculoskeletal:     Cervical back: Normal range of motion and neck supple.     Right lower leg: No edema.     Left lower leg: No edema.  Lymphadenopathy:     Cervical: No cervical adenopathy.  Skin:    General: Skin is warm and dry.   Neurological:     Mental Status: He is alert and oriented to person, place, and time.     Cranial Nerves: No cranial nerve deficit.  Psychiatric:        Mood and Affect: Mood normal.        Behavior: Behavior normal.           Assessment & Plan:

## 2024-01-05 ENCOUNTER — Telehealth: Payer: Self-pay | Admitting: Family Medicine

## 2024-01-05 ENCOUNTER — Ambulatory Visit: Admitting: Family Medicine

## 2024-01-05 NOTE — Telephone Encounter (Signed)
 I have faxed back both orders to [facility]

## 2024-01-05 NOTE — Telephone Encounter (Signed)
 These orders have already been completed and signed. Please see other phone note. They have been refaxed

## 2024-01-05 NOTE — Telephone Encounter (Signed)
 Home Health Certification or Plan of Care Tracking  Is this a Certification or Plan of Care? Plan of Care  [facility] Agency: [facility]  Order Number:  [id number], [id number]  Has charge sheet been attached? Yes  Where has form been placed:   Labeled & placed in provider bin

## 2024-01-08 ENCOUNTER — Telehealth: Payer: Self-pay | Admitting: Neurology

## 2024-01-08 NOTE — Telephone Encounter (Signed)
 I spoke to Ms. Huckaba, the wife of John Woodward.  He is in the aspire study comparing aspirin  and Eliquis  after a recent ICH stroke.  For the study he has 2 bottles, 1 either aspirin  or placebo and an other either Eliquis  or placebo.  He is out of the aspirin  or placebo bottle and the wife called to ask what they should do.  I discussed this with Dr. Rosemarie who is the site principal investigator of the trial.  He should continue to take the Eliquis  or placebo pills.  We would let the research office know and they will try to get the patient in as soon as possible for a study visit.  I also gave the wife the phone number for the research office.  If she is not called before 9 AM she should call them on Monday.

## 2024-01-10 ENCOUNTER — Other Ambulatory Visit (HOSPITAL_COMMUNITY): Payer: Self-pay | Admitting: Neurology

## 2024-01-10 MED ORDER — STUDY - ASPIRE - ASPIRIN 81 MG OR PLACEBO TABLET (PI-SETHI): 81.0000 mg | ORAL_TABLET | Freq: Every day | ORAL | 0 refills | Status: AC

## 2024-01-10 MED ORDER — STUDY - ASPIRE - APIXABAN 5 MG OR PLACEBO TABLET (PI-SETHI): 5.0000 mg | ORAL_TABLET | Freq: Two times a day (BID) | ORAL | 0 refills | Status: AC

## 2024-01-10 NOTE — Telephone Encounter (Signed)
 Patient's wife, Virginia  Encinas, patient is out of the research medication, STUDY - ASPIRE - aspirin  81 mg or placebo tablet (PI-Sethi). Virginia  Transferred patient Research.,

## 2024-01-12 ENCOUNTER — Encounter: Payer: Self-pay | Admitting: Physical Medicine and Rehabilitation

## 2024-01-12 ENCOUNTER — Encounter: Admitting: Physical Medicine and Rehabilitation

## 2024-01-12 VITALS — BP 132/70 | HR 83 | Ht 71.0 in | Wt 155.0 lb

## 2024-01-12 DIAGNOSIS — I619 Nontraumatic intracerebral hemorrhage, unspecified: Secondary | ICD-10-CM | POA: Insufficient documentation

## 2024-01-12 DIAGNOSIS — R630 Anorexia: Secondary | ICD-10-CM | POA: Diagnosis not present

## 2024-01-12 DIAGNOSIS — K529 Noninfective gastroenteritis and colitis, unspecified: Secondary | ICD-10-CM | POA: Diagnosis not present

## 2024-01-12 DIAGNOSIS — R413 Other amnesia: Secondary | ICD-10-CM | POA: Diagnosis not present

## 2024-01-12 MED ORDER — LOPERAMIDE HCL 2 MG PO TABS: 2.0000 mg | ORAL_TABLET | ORAL | Status: AC | PRN

## 2024-01-12 NOTE — Patient Instructions (Addendum)
 See handout for FODMOPs diet; ok to use imodium  as needed Follow up in 6 months   VISIT SUMMARY: Today, we discussed your ongoing recovery from a hemorrhagic stroke, including your left-sided weakness, chronic diarrhea, and sleep issues. We also reviewed your memory and appetite concerns.  YOUR PLAN: CHRONIC INTRAPARENCHYMAL HEMORRHAGIC STROKE WITH RESIDUAL LEFT-SIDED NEGLECT AND FUNCTIONAL IMPAIRMENT: You have ongoing issues with left-sided weakness and abnormal walking patterns but remain independent in daily activities. -Continue using a cane in your right hand for stability. -Be cautious on uneven surfaces and steps. -Keep up with your home exercise program. -Do exercises to increase awareness of your left side. -We will see you again in six months to check on your progress.  CHRONIC DIARRHEA: You have ongoing diarrhea despite stopping Aricept  and fish oil, but your weight is stable. -Follow the dietary guidelines provided, including the FODMAPs diet. -Take loperamide  before going out. -Keep track of your bowel movements every 2-3 days. -Consider seeing a gastroenterologist--referral sent  MEMORY DEFICIT: Your memory is appropriate for your age and has not worsened since your hospitalization. -We will reassess your cognitive status in six months.  POOR APPETITE: Your appetite is poor, but your weight has remained stable. -Follow the dietary guidelines provided to support your nutrition. -Monitor your weight and appetite, and we will review this at your next visit.  RESTLESS LEGS SYNDROME: You continue to have symptoms of restless legs syndrome, and your sleep-wake cycle is disrupted. -Continue taking gabapentin  at night. -Follow the sleep hygiene recommendations provided.   Pick the same time to lay down every night, ideally between 8 and 10 PM.    Starting 1 hour before you want to go to sleep, turn off all television screens, phone screens, tablets, and computers.    Keep  the lights low and perform only low stimulation activities, such as reading.    Only use your bedroom for sleep and sex. Avoid daytime naps and limit any time spent in your bed that is not dedicated to sleep.   You may also take 3 to 5 mg of over-the-counter melatonin approximately 1 hour before bedtime, or if you are prescribed a medication for sleep take it at this time.  Avoid alcohol, decongestants/pseudoephedrine, antihistamines, caffeine, and nicotine  a few hours before bedtime, as these can reduce your quality of sleep.       Contains text generated by [employer].

## 2024-01-12 NOTE — Progress Notes (Signed)
 Subjective:    Patient ID: John Woodward., male    DOB: March 21, 1945, 78 y.o.   MRN: [id number]  HPI   John Woodward is a 78 y.o. year old male  who  has a past medical history of Arthritis, Carotid artery disease, Closed fracture dislocation of right elbow, Diabetes mellitus without complication (HCC), Diverticulosis, Hemorrhoids, colonic polyps (02/23/2017), Hyperlipidemia, Hypertension, Memory loss, and PAD (peripheral artery disease).   They are presenting to PM&R clinic for follow up related to right basal ganglia IPH with left side alien arm and leg , no sensory loss .    Interval Hx: ICH ( Intracerebal hemorrhage): Has a scheduled appointment with Dr Rosemarie: He is currently on Aspire Trial. He is receiving Outpatient Therapy with [facility].  Paroxysmal Atrial Fibrillation : Cardiology Following. Continue to Monitor.   Essential Hypertension: Continue current medication regimen. PCP following. Continue to Monitor.    Memory deficit.: Continue Current medication regimen. PCP and Neurology Following. Continue to Monitor.    F/U with Dr Emeline in 4-6 weeks    Discussed the use of AI scribe software for clinical note transcription with the patient, who gave verbal consent to proceed.  History of Present Illness   John Woodward. is a 78 year old male with chronic nontraumatic intracerebral hemorrhage and residual left-sided weakness who presents for follow-up in physical medicine and rehabilitation.  He completed inpatient and outpatient physical and occupational therapy after his hemorrhagic stroke but felt therapy was unsatisfactory due to frequent rescheduling, delays, and perceived limited benefit. He describes marked fatigue from sessions and ongoing severe fatigue with basic tasks such as putting on shoes and socks, which requires him to rest afterward.  He reports persistently decreased energy compared to his pre-stroke baseline but remains independent with dressing,  showering, feeding, and toileting and is highly motivated to preserve this independence.  He has a markedly dysregulated sleep-wake cycle with day-night reversal and nonrestorative sleep at any hour, which contributes to poor daytime energy, though he can still complete basic self-care independently.  He denies pain, spasticity, paresthesias, or falls since discharge. His caregiver notes occasional minor stumbles without injury. He uses a walker for stability, primarily with his right hand.  He takes gabapentin  at night for restless legs syndrome and Lexapro  20 mg daily. He denies depressed mood, hopelessness, or other mood disturbances.  He reports chronic diarrhea that limits social participation, with three episodes yesterday. Aricept  and high-dose fish oil were stopped without improvement. He recently started half a tablet of over-the-counter loperamide  every other day. He denies constipation, hematochezia, or antacid use. Appetite is poor but weight has been stable at 155 lbs for several years.  He and his caregiver report no worsening confusion or memory issues since hospitalization. He feels his cognition is unchanged.         Pain Inventory Average Pain 3 Pain Right Now 3 My pain is No pain from the stroke. Left ankle pain from years ago.  LOCATION OF PAIN  No pain  BOWEL Number of stools per week: 14 or more per week. Sometimes more than 3 a day. Oral laxative use No    BLADDER Normal    Mobility walk with assistance use a cane ability to climb steps?  yes do you drive?  no Do you have any goals in this area?  yes  Function retired I need assistance with the following:  bathing and household duties Do you have any goals in this  area?  yes  Neuro/Psych bowel control problems weakness trouble walking Involuntary of left arm & left leg  Prior Studies Any changes since last visit?  no  Physicians involved in your care Any changes since last visit?   no   Family History  Problem Relation Age of Onset   Hypertension Mother    Dementia Mother        61s   Transient ischemic attack Mother        66s   Kidney disease Father    COPD Father    Hypertension Brother    Lung cancer Maternal Aunt    Alcohol abuse Maternal Grandfather    Mental illness Paternal Grandmother    Hypertension Daughter    Healthy Son    Learning disabilities Son    Colon cancer Neg Hx    Colon polyps Neg Hx    Esophageal cancer Neg Hx    Rectal cancer Neg Hx    Stomach cancer Neg Hx    Social History   Socioeconomic History   Marital status: Married    Spouse name: John Woodward   Number of children: 3   Years of education: Not on file   Highest education level: 12th grade  Occupational History   Occupation: Retired    Comment: Curator  Tobacco Use   Smoking status: Former    Types: Cigarettes    Start date: 06/27/2022    Quit date: 1968    Years since quitting: 58.0   Smokeless tobacco: Never   Tobacco comments:    Taking Nicotine  lozenges  Vaping Use   Vaping status: Never Used  Substance and Sexual Activity   Alcohol use: No   Drug use: No   Sexual activity: Yes  Other Topics Concern   Not on file  Social History Narrative   Social Review      Patient lives at home with. Spouse John Woodward   Pt lives in a one or two story home? One story home   Does patient lives in a facility? If so where? Private home   What is patient highest level of education? 12 grade   Pt is RIGHT handed            Daughter died in Dec 27, 2021  Social Drivers of Health   Tobacco Use: Medium Risk (01/04/2024)   Patient History    Smoking Tobacco Use: Former    Smokeless Tobacco Use: Never    Passive Exposure: Not on file  Financial Resource Strain: Low Risk (09/14/2023)   Overall Financial Resource Strain (CARDIA)    Difficulty of Paying Living Expenses: Not hard at all  Food Insecurity: No Food Insecurity (11/02/2023)   Epic    Worried About  Programme Researcher, Broadcasting/film/video in the Last Year: Never true    Ran Out of Food in the Last Year: Never true  Transportation Needs: No Transportation Needs (11/02/2023)   Epic    Lack of Transportation (Medical): No    Lack of Transportation (Non-Medical): No  Physical Activity: Inactive (09/14/2023)   Exercise Vital Sign    Days of Exercise per Week: 0 days    Minutes of Exercise per Session: 0 min  Stress: No Stress Concern Present (09/14/2023)   [employer] of Occupational Health - Occupational Stress Questionnaire    Feeling of Stress: Not at all  Social Connections: Moderately Isolated (11/02/2023)   Social Connection and Isolation Panel    Frequency of Communication with Friends and Family: Twice  a week    Frequency of Social Gatherings with Friends and Family: Once a week    Attends Religious Services: Never    Database Administrator or Organizations: No    Attends Banker Meetings: Never    Marital Status: Married  Depression (PHQ2-9): Low Risk (01/12/2024)   Depression (PHQ2-9)    PHQ-2 Score: 0  Alcohol Screen: Low Risk (09/14/2023)   Alcohol Screen    Last Alcohol Screening Score (AUDIT): 0  Housing: Low Risk (11/02/2023)   Epic    Unable to Pay for Housing in the Last Year: No    Number of Times Moved in the Last Year: 0    Homeless in the Last Year: No  Utilities: Not At Risk (11/02/2023)   Epic    Threatened with loss of utilities: No  Health Literacy: Adequate Health Literacy (09/14/2023)   B1300 Health Literacy    Frequency of need for help with medical instructions: Never   Past Surgical History:  Procedure Laterality Date   OLECRANON BURSECTOMY Right 08/18/2016   Procedure: OLECRANON BURSECTOMY;  Surgeon: Addie Glendia Hacker, MD;  Location: [facility] OR;  Service: Orthopedics;  Laterality: Right;   Past Medical History:  Diagnosis Date   Arthritis    Carotid artery disease    Closed fracture dislocation of right elbow    30 years ago -- fell out of theback of  a truck   Diabetes mellitus without complication (HCC)    Diverticulosis    Hemorrhoids    Hx of colonic polyps 02/23/2017   Hyperlipidemia    Hypertension    Diagnosed 10 years ago. No on medication. Denies history of CAD.    Memory loss    PAD (peripheral artery disease)    BP 132/70   Pulse 83   Ht 5' 11 (1.803 m)   Wt 155 lb (70.3 kg)   SpO2 95%   BMI 21.62 kg/m   Opioid Risk Score:   Fall Risk Score:  `1  Depression screen [facility] 2/9     01/12/2024   10:48 AM 11/30/2023   10:45 AM 09/14/2023    3:17 PM 05/26/2023   10:45 AM 11/24/2022   10:16 AM 08/10/2022    9:42 AM 04/16/2022   11:37 AM  Depression screen PHQ 2/9  Decreased Interest 0 0 0 0 1 1 1   Down, Depressed, Hopeless 0 0 0 0 1 1 1   PHQ - 2 Score 0 0 0 0 2 2 2   Altered sleeping  0 0 0 1 0 1  Tired, decreased energy  0 0 0 1 2 0  Change in appetite  0 0 0 1 0 0  Feeling bad or failure about yourself   0 0 0 0 2 0  Trouble concentrating  0 0 0 1 1 0  Moving slowly or fidgety/restless  0 0 0 1 0 0  Suicidal thoughts  0 0 0 1 0 0  PHQ-9 Score  0  0  0  8  7  3    Difficult doing work/chores  Somewhat difficult  Not difficult at all Not difficult at all Somewhat difficult Not difficult at all     Data saved with a previous flowsheet row definition    Review of Systems  Musculoskeletal:  Positive for gait problem.       Involuntary movement for left arm & left leg.  Neurological:  Positive for weakness.  All other systems reviewed and are negative.  Objective:   Physical Exam     Constitutional: No distress . Vital signs reviewed.    HEENT: NCAT, EOMI, oral membranes moist.  Wearing glasses. Neck: supple Cardiovascular: RRR without murmur. No JVD    Respiratory/Chest: Clear to auscultation bilaterally.  On room air.    GI/Abdomen: BS +, non-tender, non-distended Ext: no clubbing, cyanosis, or edema Psych: pleasant and cooperative  Skin: No evidence of breakdown, no evidence of rash Neurologic:  Cranial nerves II through XII intact, motor strength is 5/5 throughout BL UE and LE + Motor restlessness with left upper and lower extremity  + Poor attention/memory--stable   + left upper extremity and left lower extremity ataxia--wandering limbs, very poor awareness  Gait: VERY poor awareness of foot placement LLE; does better with single point cane in Right hand     Assessment & Plan:   Harout Scheurich. is a 78 y.o. year old male  who  has a past medical history of Arthritis, Carotid artery disease, Closed fracture dislocation of right elbow, Diabetes mellitus without complication (HCC), Diverticulosis, Hemorrhoids, colonic polyps (02/23/2017), Hyperlipidemia, Hypertension, Memory loss, and PAD (peripheral artery disease).   They are presenting to PM&R clinic for  right basal ganglia IPH with left side alien arm and leg , no sensory loss .  Assessment and Plan    Chronic intraparenchymal hemorrhagic stroke with residual left-sided neglect and functional impairment Chronic post-stroke deficits persist with left-sided neglect and abnormal gait. Independent in ADLs with preserved strength and cognition. No falls reported. - Reinforced use of cane in right hand for stability. - Educated on safety precautions for uneven surfaces and steps. - Encouraged continuation of home exercise program. - Recommended exercises to increase left-sided awareness. - Scheduled follow-up in six months to monitor status.  Chronic diarrhea Intermittent diarrhea persists despite discontinuation of Aricept  and fish oil. No alarm features. Stable weight. - Provided dietary handouts including FODMAPs diet. - Advised loperamide  use before outings. - Instructed to monitor bowel movements every 2-3 days. - Offered referral to gastroenterology.  Memory deficit Cognition appropriate for age with no decline since hospitalization. - Scheduled follow-up in six months to reassess cognitive status.  Poor  appetite Appetite poor but weight stable. - Provided dietary handouts to support nutrition. - Monitor weight and appetite at follow-up.  Restless legs syndrome Symptoms persist. Sleep-wake cycle disrupted, daytime function maintained. - Continued gabapentin  at night. - Provided sleep hygiene recommendations.

## 2024-01-17 ENCOUNTER — Inpatient Hospital Stay: Admitting: Neurology

## 2024-02-04 ENCOUNTER — Encounter: Payer: Self-pay | Admitting: Family Medicine

## 2024-02-04 ENCOUNTER — Ambulatory Visit: Admitting: Family Medicine

## 2024-02-04 VITALS — BP 120/68 | HR 73 | Ht 71.0 in | Wt 147.0 lb

## 2024-02-04 DIAGNOSIS — R634 Abnormal weight loss: Secondary | ICD-10-CM | POA: Insufficient documentation

## 2024-02-04 DIAGNOSIS — E119 Type 2 diabetes mellitus without complications: Secondary | ICD-10-CM | POA: Diagnosis not present

## 2024-02-04 DIAGNOSIS — I10 Essential (primary) hypertension: Secondary | ICD-10-CM

## 2024-02-04 LAB — BASIC METABOLIC PANEL WITH GFR
BUN: 14 mg/dL (ref 6–23)
CO2: 30 meq/L (ref 19–32)
Calcium: 9.1 mg/dL (ref 8.4–10.5)
Chloride: 103 meq/L (ref 96–112)
Creatinine, Ser: 1.27 mg/dL (ref 0.40–1.50)
GFR: 54.27 mL/min — ABNORMAL LOW
Glucose, Bld: 118 mg/dL — ABNORMAL HIGH (ref 70–99)
Potassium: 3.9 meq/L (ref 3.5–5.1)
Sodium: 142 meq/L (ref 135–145)

## 2024-02-04 LAB — CBC WITH DIFFERENTIAL/PLATELET
Basophils Absolute: 0.1 K/uL (ref 0.0–0.1)
Basophils Relative: 1 % (ref 0.0–3.0)
Eosinophils Absolute: 0.4 K/uL (ref 0.0–0.7)
Eosinophils Relative: 6.7 % — ABNORMAL HIGH (ref 0.0–5.0)
HCT: 47.2 % (ref 39.0–52.0)
Hemoglobin: 15.8 g/dL (ref 13.0–17.0)
Lymphocytes Relative: 23.7 % (ref 12.0–46.0)
Lymphs Abs: 1.3 K/uL (ref 0.7–4.0)
MCHC: 33.4 g/dL (ref 30.0–36.0)
MCV: 95.1 fl (ref 78.0–100.0)
Monocytes Absolute: 0.5 K/uL (ref 0.1–1.0)
Monocytes Relative: 8.6 % (ref 3.0–12.0)
Neutro Abs: 3.3 K/uL (ref 1.4–7.7)
Neutrophils Relative %: 60 % (ref 43.0–77.0)
Platelets: 278 K/uL (ref 150.0–400.0)
RBC: 4.96 Mil/uL (ref 4.22–5.81)
RDW: 13.2 % (ref 11.5–15.5)
WBC: 5.5 K/uL (ref 4.0–10.5)

## 2024-02-04 LAB — LIPID PANEL
Cholesterol: 123 mg/dL (ref 28–200)
HDL: 38.9 mg/dL — ABNORMAL LOW
LDL Cholesterol: 60 mg/dL (ref 10–99)
NonHDL: 84.16
Total CHOL/HDL Ratio: 3
Triglycerides: 121 mg/dL (ref 10.0–149.0)
VLDL: 24.2 mg/dL (ref 0.0–40.0)

## 2024-02-04 LAB — HEPATIC FUNCTION PANEL
ALT: 23 U/L (ref 3–53)
AST: 22 U/L (ref 5–37)
Albumin: 4.1 g/dL (ref 3.5–5.2)
Alkaline Phosphatase: 90 U/L (ref 39–117)
Bilirubin, Direct: 0.1 mg/dL (ref 0.1–0.3)
Total Bilirubin: 0.5 mg/dL (ref 0.2–1.2)
Total Protein: 6.6 g/dL (ref 6.0–8.3)

## 2024-02-04 LAB — HEMOGLOBIN A1C: Hgb A1c MFr Bld: 6.6 % — ABNORMAL HIGH (ref 4.6–6.5)

## 2024-02-04 LAB — TSH: TSH: 1.99 u[IU]/mL (ref 0.35–5.50)

## 2024-02-04 MED ORDER — AMLODIPINE BESYLATE 5 MG PO TABS: 5.0000 mg | ORAL_TABLET | Freq: Every day | ORAL | 1 refills | Status: AC

## 2024-02-04 NOTE — Progress Notes (Signed)
" ° °  Subjective:    Patient ID: John Woodward., male    DOB: [birth year], 79 y.o.   MRN: [id number]  HPI HTN- at last visit we decreased Amlodipine  to 5mg  daily.  Also on Losartan  50mg  daily.  Blood pressure is excellent today.  Denies CP, SOB, HA's, visual changes, edema.  Weight loss- pt is down 8 lbs since 12/17.  Wife reports decreased appetite.  Pt continues to have days and nights switched which makes feeding him harder.  DM- currently diet controlled.  UTD on eye exam, foot exam, microalbumin   Review of Systems For ROS see HPI     Objective:   Physical Exam Constitutional:      General: He is not in acute distress.    Appearance: He is well-developed.  HENT:     Head: Normocephalic and atraumatic.  Eyes:     Extraocular Movements: Extraocular movements intact.     Conjunctiva/sclera: Conjunctivae normal.     Pupils: Pupils are equal, round, and reactive to light.  Neck:     Thyroid : No thyromegaly.  Cardiovascular:     Rate and Rhythm: Normal rate. Rhythm irregular.     Pulses: Normal pulses.     Heart sounds: No murmur heard.    Comments: Distant heart sounds Pulmonary:     Effort: Pulmonary effort is normal. No respiratory distress.     Breath sounds: Normal breath sounds.  Abdominal:     General: Bowel sounds are normal. There is no distension.     Palpations: Abdomen is soft.  Musculoskeletal:     Cervical back: Normal range of motion and neck supple.     Right lower leg: No edema.     Left lower leg: No edema.  Lymphadenopathy:     Cervical: No cervical adenopathy.  Skin:    General: Skin is warm and dry.  Neurological:     General: No focal deficit present.     Mental Status: He is alert and oriented to person, place, and time.     Cranial Nerves: No cranial nerve deficit.  Psychiatric:        Mood and Affect: Mood normal.        Behavior: Behavior normal.           Assessment & Plan:    "

## 2024-02-04 NOTE — Assessment & Plan Note (Signed)
 Chronic problem.  BP is well controlled since decreasing Amlodipine  to 5mg  daily.  New prescription sent for 5mg  pills.  Continue Losartan  50mg  daily.  No changes at this time

## 2024-02-04 NOTE — Assessment & Plan Note (Signed)
 Chronic problem.  Currently diet controlled.  UTD on eye exam, foot exam, microalbumin.  Check labs.

## 2024-02-04 NOTE — Assessment & Plan Note (Signed)
 New.  Pt is down 8 lbs since 12/17.  Pt and wife report decreased appetite and she reports it's hard to feed him when he has his days and nights flipped- he stays up all night watching TV and sleeps during the day.  Stressed the need for increased caloric intake and protein.  Can be in form of shakes/supplements.  Will check labs and follow.

## 2024-02-04 NOTE — Patient Instructions (Addendum)
 Follow up in 3 months to recheck sugar, blood pressure, and cholesterol We'll notify you of your lab results and make any changes if needed START a daily multivitamin Try and increase your daily calorie intake to avoid additional weight loss Call with any questions or concerns Stay Safe!  Stay Healthy! Happy New Year!!

## 2024-02-07 ENCOUNTER — Ambulatory Visit: Payer: Self-pay | Admitting: Family Medicine

## 2024-02-08 NOTE — Progress Notes (Signed)
 Lab results have been discussed.   Verbalized understanding? Yes  Are there any questions? No

## 2024-02-11 ENCOUNTER — Other Ambulatory Visit: Payer: Self-pay | Admitting: Family Medicine

## 2024-02-29 ENCOUNTER — Ambulatory Visit: Admitting: Adult Health

## 2024-02-29 ENCOUNTER — Encounter: Payer: Self-pay | Admitting: Adult Health

## 2024-02-29 VITALS — BP 115/64 | HR 74 | Ht 71.0 in | Wt 150.8 lb

## 2024-02-29 DIAGNOSIS — G255 Other chorea: Secondary | ICD-10-CM

## 2024-02-29 DIAGNOSIS — I610 Nontraumatic intracerebral hemorrhage in hemisphere, subcortical: Secondary | ICD-10-CM

## 2024-02-29 DIAGNOSIS — I619 Nontraumatic intracerebral hemorrhage, unspecified: Secondary | ICD-10-CM

## 2024-02-29 NOTE — Patient Instructions (Signed)
 Continue participation in Aspire trial and continue Crestor  for secondary stroke prevention  Please let me know if you are interested in trialing a medication to help with movements on your left side.  Medications that are typically used as first-line to treat these movements include risperidone, Haldol and clonazepam.  Continue to follow up with PCP regarding blood pressure and cholesterol management  Maintain strict control of hypertension with blood pressure goal below 130/90 and cholesterol with LDL cholesterol (bad cholesterol) goal below 70 mg/dL.   Signs of a Stroke? Follow the BEFAST method:  Balance Watch for a sudden loss of balance, trouble with coordination or vertigo Eyes Is there a sudden loss of vision in one or both eyes? Or double vision?  Face: Ask the person to smile. Does one side of the face droop or is it numb?  Arms: Ask the person to raise both arms. Does one arm drift downward? Is there weakness or numbness of a leg? Speech: Ask the person to repeat a simple phrase. Does the speech sound slurred/strange? Is the person confused ? Time: If you observe any of these signs, call 911.        Thank you for coming to see us  at [facility]. I hope we have been able to provide you high quality care today.  You may receive a patient satisfaction survey over the next few weeks. We would appreciate your feedback and comments so that we may continue to improve ourselves and the health of our patients.

## 2024-03-20 ENCOUNTER — Ambulatory Visit: Admitting: Podiatry

## 2024-05-05 ENCOUNTER — Ambulatory Visit: Admitting: Family Medicine

## 2024-05-29 ENCOUNTER — Ambulatory Visit: Admitting: Surgery

## 2024-05-29 ENCOUNTER — Ambulatory Visit (HOSPITAL_COMMUNITY)

## 2024-07-12 ENCOUNTER — Encounter: Admitting: Physical Medicine and Rehabilitation

## 2024-09-26 ENCOUNTER — Encounter
